# Patient Record
Sex: Male | Born: 1950 | Race: Black or African American | Hispanic: No | Smoking: Never smoker
Health system: Southern US, Community
[De-identification: ages and names within clinical notes are randomized; demographics above are authoritative.]

## PROBLEM LIST (undated history)

## (undated) DIAGNOSIS — I272 Pulmonary hypertension, unspecified: Secondary | ICD-10-CM

## (undated) DIAGNOSIS — I10 Essential (primary) hypertension: Secondary | ICD-10-CM

## (undated) DIAGNOSIS — E785 Hyperlipidemia, unspecified: Secondary | ICD-10-CM

## (undated) DIAGNOSIS — I5189 Other ill-defined heart diseases: Secondary | ICD-10-CM

## (undated) DIAGNOSIS — C9000 Multiple myeloma not having achieved remission: Secondary | ICD-10-CM

## (undated) DIAGNOSIS — IMO0002 Reserved for concepts with insufficient information to code with codable children: Secondary | ICD-10-CM

## (undated) DIAGNOSIS — Z720 Tobacco use: Secondary | ICD-10-CM

## (undated) DIAGNOSIS — I4891 Unspecified atrial fibrillation: Secondary | ICD-10-CM

## (undated) DIAGNOSIS — K922 Gastrointestinal hemorrhage, unspecified: Secondary | ICD-10-CM

## (undated) DIAGNOSIS — D61818 Other pancytopenia: Secondary | ICD-10-CM

## (undated) DIAGNOSIS — D472 Monoclonal gammopathy: Secondary | ICD-10-CM

## (undated) DIAGNOSIS — R9430 Abnormal result of cardiovascular function study, unspecified: Secondary | ICD-10-CM

## (undated) DIAGNOSIS — I340 Nonrheumatic mitral (valve) insufficiency: Secondary | ICD-10-CM

## (undated) DIAGNOSIS — I517 Cardiomegaly: Secondary | ICD-10-CM

## (undated) DIAGNOSIS — H409 Unspecified glaucoma: Secondary | ICD-10-CM

## (undated) DIAGNOSIS — N186 End stage renal disease: Secondary | ICD-10-CM

## (undated) HISTORY — DX: Nonrheumatic mitral (valve) insufficiency: I34.0

## (undated) HISTORY — DX: Essential (primary) hypertension: I10

## (undated) HISTORY — PX: KIDNEY TRANSPLANT: SHX239

## (undated) HISTORY — DX: Unspecified atrial fibrillation: I48.91

## (undated) HISTORY — DX: Pulmonary hypertension, unspecified: I27.20

## (undated) HISTORY — DX: Other pancytopenia: D61.818

## (undated) HISTORY — DX: Gastrointestinal hemorrhage, unspecified: K92.2

## (undated) HISTORY — DX: Other ill-defined heart diseases: I51.89

## (undated) HISTORY — DX: Abnormal result of cardiovascular function study, unspecified: R94.30

## (undated) HISTORY — DX: Multiple myeloma not having achieved remission: C90.00

## (undated) HISTORY — DX: Tobacco use: Z72.0

## (undated) HISTORY — DX: Hyperlipidemia, unspecified: E78.5

## (undated) HISTORY — DX: Reserved for concepts with insufficient information to code with codable children: IMO0002

## (undated) HISTORY — DX: Cardiomegaly: I51.7

## (undated) HISTORY — DX: End stage renal disease: N18.6

## (undated) HISTORY — DX: Monoclonal gammopathy: D47.2

---

## 1990-08-05 HISTORY — PX: AV FISTULA PLACEMENT: SHX1204

## 2006-06-30 ENCOUNTER — Ambulatory Visit: Payer: Self-pay | Admitting: Cardiology

## 2006-08-04 ENCOUNTER — Ambulatory Visit: Payer: Self-pay | Admitting: Cardiology

## 2006-09-12 ENCOUNTER — Ambulatory Visit: Payer: Self-pay | Admitting: Cardiology

## 2006-10-16 ENCOUNTER — Ambulatory Visit: Payer: Self-pay | Admitting: Gastroenterology

## 2006-10-28 ENCOUNTER — Ambulatory Visit: Payer: Self-pay | Admitting: Cardiology

## 2006-11-14 ENCOUNTER — Ambulatory Visit: Payer: Self-pay | Admitting: Internal Medicine

## 2006-11-14 ENCOUNTER — Encounter (INDEPENDENT_AMBULATORY_CARE_PROVIDER_SITE_OTHER): Payer: Self-pay | Admitting: Specialist

## 2006-11-14 ENCOUNTER — Ambulatory Visit (HOSPITAL_COMMUNITY): Admission: RE | Admit: 2006-11-14 | Discharge: 2006-11-14 | Payer: Self-pay | Admitting: Internal Medicine

## 2007-02-11 ENCOUNTER — Ambulatory Visit: Admission: RE | Admit: 2007-02-11 | Discharge: 2007-04-13 | Payer: Self-pay | Admitting: Radiation Oncology

## 2008-01-05 ENCOUNTER — Ambulatory Visit: Payer: Self-pay | Admitting: Cardiology

## 2008-01-19 ENCOUNTER — Encounter: Payer: Self-pay | Admitting: Physician Assistant

## 2008-01-19 ENCOUNTER — Ambulatory Visit: Payer: Self-pay | Admitting: Cardiology

## 2008-02-29 ENCOUNTER — Encounter: Payer: Self-pay | Admitting: Physician Assistant

## 2008-02-29 ENCOUNTER — Ambulatory Visit: Payer: Self-pay | Admitting: Cardiology

## 2008-03-11 ENCOUNTER — Ambulatory Visit: Payer: Self-pay | Admitting: Internal Medicine

## 2008-03-17 ENCOUNTER — Encounter: Payer: Self-pay | Admitting: Cardiology

## 2008-03-17 ENCOUNTER — Ambulatory Visit (HOSPITAL_COMMUNITY): Admission: RE | Admit: 2008-03-17 | Discharge: 2008-03-17 | Payer: Self-pay | Admitting: Internal Medicine

## 2008-03-28 ENCOUNTER — Ambulatory Visit: Payer: Self-pay | Admitting: Internal Medicine

## 2008-04-14 ENCOUNTER — Ambulatory Visit (HOSPITAL_COMMUNITY): Admission: RE | Admit: 2008-04-14 | Discharge: 2008-04-14 | Payer: Self-pay | Admitting: Internal Medicine

## 2008-04-14 ENCOUNTER — Ambulatory Visit: Payer: Self-pay | Admitting: Internal Medicine

## 2008-07-14 ENCOUNTER — Encounter: Payer: Self-pay | Admitting: Cardiology

## 2008-07-15 ENCOUNTER — Encounter: Payer: Self-pay | Admitting: Cardiology

## 2008-09-08 ENCOUNTER — Ambulatory Visit: Payer: Self-pay | Admitting: Cardiology

## 2009-04-04 ENCOUNTER — Ambulatory Visit: Payer: Self-pay | Admitting: Cardiology

## 2009-04-04 ENCOUNTER — Encounter: Payer: Self-pay | Admitting: Cardiology

## 2009-04-18 ENCOUNTER — Ambulatory Visit: Payer: Self-pay | Admitting: Cardiology

## 2009-04-21 ENCOUNTER — Encounter (INDEPENDENT_AMBULATORY_CARE_PROVIDER_SITE_OTHER): Payer: Self-pay | Admitting: *Deleted

## 2009-05-10 ENCOUNTER — Encounter: Payer: Self-pay | Admitting: Cardiology

## 2009-10-30 ENCOUNTER — Ambulatory Visit: Payer: Self-pay | Admitting: Cardiology

## 2010-01-22 ENCOUNTER — Ambulatory Visit: Payer: Self-pay | Admitting: Cardiology

## 2010-02-21 ENCOUNTER — Ambulatory Visit (HOSPITAL_COMMUNITY): Admission: RE | Admit: 2010-02-21 | Discharge: 2010-02-21 | Payer: Self-pay | Admitting: Nephrology

## 2010-04-18 ENCOUNTER — Ambulatory Visit: Payer: Self-pay | Admitting: Cardiology

## 2010-05-07 ENCOUNTER — Ambulatory Visit: Payer: Self-pay | Admitting: Cardiology

## 2010-06-15 ENCOUNTER — Encounter: Payer: Self-pay | Admitting: Cardiology

## 2010-06-18 ENCOUNTER — Encounter: Payer: Self-pay | Admitting: Cardiology

## 2010-09-06 NOTE — Assessment & Plan Note (Signed)
Summary: 3 mo fu   Visit Type:  Follow-up Primary Charnele Semple:  [name]   History of Present Illness: the patient is a 60 year old male recently hospitalized with alcoholic pancreatitis. The patient has end-stage renal disease on hemodialysis, aspirin and a defibrillation but is not a Coumadin candidate. In addition he has severe valvular heart disease, including moderate to severe mitral regurgitation moderate tricuspid regurgitation associated with severe pulmonary hypertension. His ejection fraction is 60-65%. We were consulted during his recent hospitalization and make changes in his medical regimen. Cardizem was increased for rate control. The patient states that he has not been drinking any alcohol. His pancreatitis appears to have resolved. He denies any chest pain shortness of breath orthopnea PND. He reports no palpitations.  Preventive Screening-Counseling & Management  Alcohol-Tobacco     Smoking Status: quit     Year Quit: 2007  Current Medications (verified): 1)  Daily Vite  Tabs (Multiple Vitamin) .... Take 1 Tablet By Mouth Once A Day 2)  Sensipar 30 Mg Tabs (Cinacalcet Hcl) .... Take 1 Tablet By Mouth Every Other Day With Evening Meal 3)  Labetalol Hcl 200 Mg Tabs (Labetalol Hcl) .... Take 1 Tablet By Mouth Two Times A Day 4)  Fosrenol 1000 Mg Chew (Lanthanum Carbonate) .[name].. 1 With Each Meal and 500mg  With Snacks 5)  Zinc 50 Mg Tabs (Zinc) .... Take 1 Tablet By Mouth Once A Day 6)  Aspirin 81 Mg  Tabs (Aspirin) .... Take 1 Tablet By Mouth Once A Day 7)  Simvastatin 20 Mg Tabs (Simvastatin) .... Take 1 Tab By Mouth At Bedtime 8)  Multivitamins  Tabs (Multiple Vitamin) .... Take 1 Tablet By Mouth Once A Day 9)  Aleve 220 Mg Tabs (Naproxen Sodium) .... As Needed 10)  Cardizem Cd 120 Mg Xr24h-Cap (Diltiazem Hcl Coated Beads) .... Take 1 Tablet By Mouth Twice A Day 11)  Midodrine Hcl 10 Mg Tabs (Midodrine Hcl) .... Take As Needed X 1 Dose For Low Blood Pressure, May Take Up To Two  Times A Day  Allergies (verified): No Known Drug Allergies  Comments:  Nurse/Medical Assistant: The patient's medication list and allergies were reviewed with the patient and were updated in the Medication and Allergy Lists.  Past History:  Family History: Last updated: 04/14/2009 Insignificant for premature CAD.   Social History: Last updated: 04/14/2009   He denies any tobacco abuse.      Risk Factors: Smoking Status: quit (05/07/2010)  Past Medical History:  1. Hypertensive heart disease with severe left ventricular      hypertrophy.  2. Hyperdynamic LV contractility, ejection fraction 60-65%.  3. Pulmonary hypertension, P-systolic pressure 99991111 mgHg.  4. Moderate2 severe mitral regurgitation.  5. End stage renal disease.  1. Persistent atrial fibrillation.     a.     CHAD2:  1. 2. Hypertension. 3. End-stage renal disease, on hemodialysis. 4. Normal LVF. 5. Moderate mitral regurgitation.     a.     By 2-D echo, June 2009. Mild to severe mitral regurgitation and moderate tricuspid regurgitation echocardiogram 2011 6. Pulmonary hypertension. 7. Dyslipidemia. 8. History of lower gastrointestinal bleed.not on Coumadin.     a.     Question secondary to arteriovenous malformation. 9. Hypertrophic cardiomyopathy.  Status post alcoholic pancreatitis  Social History: Smoking Status:  quit  Review of Systems  The patient denies fatigue, malaise, fever, weight gain/loss, vision loss, decreased hearing, hoarseness, chest pain, palpitations, shortness of breath, prolonged cough, wheezing, sleep apnea, coughing up blood, abdominal pain, blood  in stool, nausea, vomiting, diarrhea, heartburn, incontinence, blood in urine, muscle weakness, joint pain, leg swelling, rash, skin lesions, headache, fainting, dizziness, depression, anxiety, enlarged lymph nodes, easy bruising or bleeding, and environmental allergies.    Vital Signs:  Patient profile:   60 year old male Height:       74 inches Weight:      162 pounds Pulse rate:   76 / minute BP sitting:   161 / 95  (right arm) Cuff size:   regular  Vitals Entered By: Georgina Peer (May 07, 2010 8:57 AM)  Physical Exam  Additional Exam:  General: Well-developed, well-nourished in no distress head: Normocephalic and atraumatic eyes PERRLA/EOMI intact, conjunctiva and lids normal nose: No deformity or lesions mouth normal dentition, normal posterior pharynx neck: Supple, no JVD.  No masses, thyromegaly or abnormal cervical nodes lungs: Normal breath sounds bilaterally without wheezing.  Normal percussion heart:irregular rate and rhythm with normal S1 and S2, no S3 or S4.  PMI is normal.  No pathological murmurs abdomen: Normal bowel sounds, abdomen is soft and nontender without masses, organomegaly or hernias noted.  No hepatosplenomegaly musculoskeletal: Back normal, normal gait muscle strength and tone normal pulsus: Pulse is normal in all 4 extremities Extremities: No peripheral pitting edema neurologic: Alert and oriented x 3 skin: Intact without lesions or rashes cervical nodes: No significant adenopathy psychologic: Normal affect    Impression & Recommendations:  Problem # 1:  CARDIOMYOPATHY, HYPERTROPHIC (ICD-425.1) Assessment Comment Only  His updated medication list for this problem includes:    Labetalol Hcl 200 Mg Tabs (Labetalol hcl) .[name]... Take 1 tablet by mouth two times a day    Aspirin 81 Mg Tabs (Aspirin) .[name]... Take 1 tablet by mouth once a day    Cardizem Cd 120 Mg Xr24h-cap (Diltiazem hcl coated beads) .[name]... Take 1 tablet by mouth twice a day  Problem # 2:  GASTROINTESTINAL HEMORRHAGE, HX OF (ICD-V12.79) patient is not a candidate for Coumadin.  Problem # 3:  ATRIAL FIBRILLATION, CHRONIC (ICD-427.31) chronic atrial fibrillation now rate controlled with adjustment of medications. His updated medication list for this problem includes:    Labetalol Hcl 200 Mg Tabs (Labetalol hcl)  .[name]... Take 1 tablet by mouth two times a day    Aspirin 81 Mg Tabs (Aspirin) .[name]... Take 1 tablet by mouth once a day  Problem # 4:  MITRAL REGURGITATION (ICD-396.3) patient has moderate to severe mitral regurgitation, moderate tricuspid regurgitation as well as severe pulmonary hypertension. Continue medical treatment. Patient is not a candidate for surgery  Patient Instructions: 1)  Your physician recommends that you continue on your current medications as directed. Please refer to the Current Medication list given to you today. 2)  Follow up in  6 months

## 2010-09-06 NOTE — Assessment & Plan Note (Signed)
Summary: 1 YR FU PER MARCH REMINDER-SRS   Visit Type:  Follow-up Primary Provider:  [name]   History of Present Illness: the patient is a 60 year old male with a history of hypertrophic myopathy, or hypertension, moderate mitral regurgitation and end-stage renal disease. The patient is currently NYHA class II. He denies any chest pain short of breath orthopnea PND. He is a dialysis schedule of Tuesday Thursday Saturday. He has no heart failure symptoms. He is compliant with his medical regimen. He has to discontinue his antihypertensive medications prior to dialysis because of hypotension. He reports no presyncope or syncope. The patient had a recent ER admission because of alcohol abuse. He is on Coumadin due to prior history of lower GI bleeding secondary to AVMs.  Current Problems (verified): 1)  Cardiomyopathy, Hypertrophic  (ICD-425.1) 2)  Gastrointestinal Hemorrhage, Hx of  (ICD-V12.79) 3)  Dyslipidemia  (ICD-272.4) 4)  Hypertension, Pulmonary  (ICD-416.8) 5)  Atrial Fibrillation, Chronic  (ICD-427.31) 6)  Renal Disease  (ICD-593.9) 7)  Mitral Regurgitation  (ICD-396.3) 8)  Hypertension  (ICD-401.9)  Current Medications (verified): 1)  Dialyvite 800-Zinc 15  Tabs (B Complex-C-Zn-Folic Acid) .[name].. 15 Daily 2)  Sensipar 30 Mg Tabs (Cinacalcet Hcl) .... Take 1 Tablet By Mouth Every Other Day With Evening Meal 3)  Amlodipine Besylate 10 Mg Tabs (Amlodipine Besylate) .... Take 1 Tablet By Mouth Once A Day 4)  Labetalol Hcl 300 Mg Tabs (Labetalol Hcl) .... 2 By Mouth Two Times A Day 5)  Fosrenol 1000 Mg Chew (Lanthanum Carbonate) .[name].. 1 With Each Meal and 500mg  With Snacks 6)  Zinc 50 Mg Tabs (Zinc) .... Take 1 Tablet By Mouth Once A Day 7)  Clonidine Hcl 0.1 Mg Tabs (Clonidine Hcl) .... Take 1 Tablet By Mouth Two Times A Day 8)  Aspirin 81 Mg  Tabs (Aspirin) .... Take 1 Tablet By Mouth Once A Day 9)  Simvastatin 20 Mg Tabs (Simvastatin) .... Take 1 Tab By Mouth At Bedtime 10)  Minoxidil  2.5 Mg Tabs (Minoxidil) .... Take 2 Tablet By Mouth Once A Day 11)  Multivitamins  Tabs (Multiple Vitamin) .... Take 1 Tablet By Mouth Once A Day 12)  Aleve 220 Mg Tabs (Naproxen Sodium) .... As Needed  Allergies (verified): No Known Drug Allergies  Comments:  Nurse/Medical Assistant: The patient's medications and allergies were reviewed with the patient and were updated in the Medication and Allergy Lists. List reviewed.  Past History:  Past Medical History: Last updated: 04/18/2009  1. Hypertensive heart disease with severe left ventricular      hypertrophy.  2. Hyperdynamic LV contractility, ejection fraction 60-65%.  3. Pulmonary hypertension, P-systolic pressure 99991111 mgHg.  4. Moderate mitral regurgitation.  5. End stage renal disease.  1. Persistent atrial fibrillation.     a.     CHAD2:  1. 2. Hypertension. 3. End-stage renal disease, on hemodialysis. 4. Normal LVF. 5. Moderate mitral regurgitation.     a.     By 2-D echo, June 2009. 6. Pulmonary hypertension. 7. Dyslipidemia. 8. History of lower gastrointestinal bleed.     a.     Question secondary to arteriovenous malformation. 9. Hypertrophic cardiomyopathy.   Family History: Last updated: 04/14/2009 Insignificant for premature CAD.   Social History: Last updated: 04/14/2009   He denies any tobacco abuse.      Risk Factors: Smoking Status: quit > 6 months (04/18/2009)  Review of Systems  The patient denies anorexia, fever, weight loss, weight gain, vision loss, decreased hearing, hoarseness, chest pain,  syncope, dyspnea on exertion, peripheral edema, prolonged cough, headaches, hemoptysis, abdominal pain, melena, hematochezia, severe indigestion/heartburn, hematuria, incontinence, genital sores, muscle weakness, suspicious skin lesions, transient blindness, difficulty walking, depression, unusual weight change, abnormal bleeding, enlarged lymph nodes, angioedema, breast masses, and testicular masses.     Vital Signs:  Patient profile:   60 year old male Height:      74 inches Weight:      164 pounds Pulse rate:   61 / minute BP sitting:   126 / 80  (right arm) Cuff size:   regular  Vitals Entered By: Georgina Peer (October 30, 2009 4:01 PM)  Physical Exam  Additional Exam:  General: Well-developed, well-nourished in no distress head: Normocephalic and atraumatic eyes PERRLA/EOMI intact, conjunctiva and lids normal nose: No deformity or lesions mouth normal dentition, normal posterior pharynx neck: Supple, no JVD.  No masses, thyromegaly or abnormal cervical nodes lungs: Normal breath sounds bilaterally without wheezing.  Normal percussion heart: regular rate and rhythm with normal S1 and S2, no S3 or S4.  PMI is normal.  No pathological murmurs abdomen: Normal bowel sounds, abdomen is soft and nontender without masses, organomegaly or hernias noted.  No hepatosplenomegaly musculoskeletal: Back normal, normal gait muscle strength and tone normal pulsus: Pulse is normal in all 4 extremities Extremities: No peripheral pitting edema neurologic: Alert and oriented x 3 skin: Intact without lesions or rashes cervical nodes: No significant adenopathy psychologic: Normal affect    Impression & Recommendations:  Problem # 1:  ATRIAL FIBRILLATION, CHRONIC (ICD-427.31) the patient's chronic atrial fibrillation. His heart is however well controlled. I made no change in his regimen. His updated medication list for this problem includes:    Labetalol Hcl 300 Mg Tabs (Labetalol hcl) .[name]... 2 by mouth two times a day    Aspirin 81 Mg Tabs (Aspirin) .[name]... Take 1 tablet by mouth once a day  Problem # 2:  CARDIOMYOPATHY, HYPERTROPHIC (ICD-425.1) patient's hypertrophic cardio myopathy. He reports no dyspnea. Continue medical therapy with beta blocker His updated medication list for this problem includes:    Amlodipine Besylate 10 Mg Tabs (Amlodipine besylate) .[name]... Take 1 tablet by mouth once  a day    Labetalol Hcl 300 Mg Tabs (Labetalol hcl) .[name]... 2 by mouth two times a day    Aspirin 81 Mg Tabs (Aspirin) .[name]... Take 1 tablet by mouth once a day  Problem # 3:  MITRAL REGURGITATION (ICD-396.3) patient has mitral regurgitant murmur and exam. However he has no heart failure symptoms. We will continue to follow this clinically .  Problem # 4:  HYPERTENSION (ICD-401.9) the patient's blood pressure is well controlled.her poorly asked to hold his blood pressure medications for dialysis because of hypotension during dialysis. His updated medication list for this problem includes:    Amlodipine Besylate 10 Mg Tabs (Amlodipine besylate) .[name]... Take 1 tablet by mouth once a day    Labetalol Hcl 300 Mg Tabs (Labetalol hcl) .[name]... 2 by mouth two times a day    Clonidine Hcl 0.1 Mg Tabs (Clonidine hcl) .[name]... Take 1 tablet by mouth two times a day    Aspirin 81 Mg Tabs (Aspirin) .[name]... Take 1 tablet by mouth once a day    Minoxidil 2.5 Mg Tabs (Minoxidil) .[name]... Take 2 tablet by mouth once a day  Patient Instructions: 1)  Your physician recommends that you continue on your current medications as directed. Please refer to the Current Medication list given to you today. 2)  Follow up in  6 months

## 2010-09-06 NOTE — Medication Information (Signed)
Summary: RX Folder/ DENIAL OF LABETALOL APPEAL  RX Folder/ DENIAL OF LABETALOL APPEAL   Imported By: Bartholomew Boards 06/20/2010 [id number]  _____________________________________________________________________  External Attachment:    Type:   Image     Comment:   External Document

## 2010-09-06 NOTE — Assessment & Plan Note (Signed)
Summary: BP/HR PROBLEMS W/DIALYSIS   Visit Type:  tachycardia Primary Provider:  [name]   History of Present Illness: the patient is a 60 year old male with a history of hypertrophic cardiomyopathy, hypertension, moderate mitral regurgitation and end-stage renal disease. The patient is an unlikely class II. Patient also has pulmonary hypertension. The patient has been referred because of increased heart rates noted to hemodialysis. This is associated with a sensation of lightheadedness. Last Thursday during his hemodialysis sessions at a very low blood pressure and increased heart rate although I do not have any further details. The patient's dialysis schedule is TTS. The patient already does not take his blood pressure medications on hemodialysis days due to hypotension.  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  Current Medications (verified): 1)  Dialyvite 800-Zinc 15  Tabs (B Complex-C-Zn-Folic Acid) .[name].. 15 Daily 2)  Sensipar 30 Mg Tabs (Cinacalcet Hcl) .... Take 1 Tablet By Mouth Every Other Day With Evening Meal 3)  Labetalol Hcl 300 Mg Tabs (Labetalol Hcl) .... 2 By Mouth Two Times A Day 4)  Fosrenol 1000 Mg Chew (Lanthanum Carbonate) .[name].. 1 With Each Meal and 500mg  With Snacks 5)  Zinc 50 Mg Tabs (Zinc) .... Take 1 Tablet By Mouth Once A Day 6)  Clonidine Hcl 0.1 Mg Tabs (Clonidine Hcl) .... Take 1 Tablet By Mouth Two Times A Day 7)  Aspirin 81 Mg  Tabs (Aspirin) .... Take 1 Tablet By Mouth Once A Day 8)  Simvastatin 20 Mg Tabs (Simvastatin) .... Take 1 Tab By Mouth At Bedtime 9)  Minoxidil 2.5 Mg Tabs (Minoxidil) .... Take 2 Tablet By Mouth Once A Day 10)  Multivitamins  Tabs (Multiple Vitamin) .... Take 1 Tablet By Mouth Once A Day 11)  Aleve 220 Mg Tabs (Naproxen Sodium) .... As Needed 12)  Cardizem Cd 120 Mg Xr24h-Cap (Diltiazem Hcl Coated Beads) .... Take 1 Tablet By Mouth Once A Day 13)   Midodrine Hcl 10 Mg Tabs (Midodrine Hcl) .... Take As Needed X 1 Dose For Low Blood Pressure, May Take Up To Two Times A Day  Allergies (verified): No Known Drug Allergies  Comments:  Nurse/Medical Assistant: The patient's medication list and allergies were reviewed with the patient and were updated in the Medication and Allergy Lists.  Past History:  Family History: Last updated: 04/14/2009 Insignificant for premature CAD.   Social History: Last updated: 04/14/2009   He denies any tobacco abuse.      Risk Factors: Smoking Status: quit > 6 months (04/18/2009)  Past Medical History:  1. Hypertensive heart disease with severe left ventricular      hypertrophy.  2. Hyperdynamic LV contractility, ejection fraction 60-65%.  3. Pulmonary hypertension, P-systolic pressure 99991111 mgHg.  4. Moderate mitral regurgitation.  5. End stage renal disease.  1. Persistent atrial fibrillation.     a.     CHAD2:  1. 2. Hypertension. 3. End-stage renal disease, on hemodialysis. 4. Normal LVF. 5. Moderate mitral regurgitation.     a.     By 2-D echo, June 2009. 6. Pulmonary hypertension. 7. Dyslipidemia. 8. History of lower gastrointestinal bleed.not on Coumadin.     a.     Question secondary to arteriovenous malformation. 9. Hypertrophic cardiomyopathy.   Review of Systems       The patient complains of shortness of breath and dizziness.  The patient denies fatigue, malaise, fever, weight gain/loss, vision loss, decreased  hearing, hoarseness, chest pain, palpitations, prolonged cough, wheezing, sleep apnea, coughing up blood, abdominal pain, blood in stool, nausea, vomiting, diarrhea, heartburn, incontinence, blood in urine, muscle weakness, joint pain, leg swelling, rash, skin lesions, headache, fainting, depression, anxiety, enlarged lymph nodes, easy bruising or bleeding, and environmental allergies.    Vital Signs:  Patient profile:   60 year old male Height:      74  inches Weight:      160 pounds BMI:     20.62 Pulse rate:   92 / minute BP sitting:   188 / 114  (right arm) Cuff size:   regular  Vitals Entered By: Georgina Peer (January 22, 2010 10:11 AM)   Physical Exam  Additional Exam:  General: Well-developed, well-nourished in no distress head: Normocephalic and atraumatic eyes PERRLA/EOMI intact, conjunctiva and lids normal nose: No deformity or lesions mouth normal dentition, normal posterior pharynx neck: Supple, no JVD.  No masses, thyromegaly or abnormal cervical nodes lungs: Normal breath sounds bilaterally without wheezing.  Normal percussion heart:irregular rate and rhythm with normal S1 and S2, no S3 or S4.  PMI is normal.  No pathological murmurs abdomen: Normal bowel sounds, abdomen is soft and nontender without masses, organomegaly or hernias noted.  No hepatosplenomegaly musculoskeletal: Back normal, normal gait muscle strength and tone normal pulsus: Pulse is normal in all 4 extremities Extremities: No peripheral pitting edema neurologic: Alert and oriented x 3 skin: Intact without lesions or rashes cervical nodes: No significant adenopathy psychologic: Normal affect    Impression & Recommendations:  Problem # 1:  ATRIAL FIBRILLATION, CHRONIC (ICD-427.31) we'll discontinue amlodipine and changed to Cardizem CD 120 mg p.o. q. daily. I do want to take the patient take his Cardizem the day  off dialysis. His updated medication list for this problem includes:    Labetalol Hcl 300 Mg Tabs (Labetalol hcl) .[name]... 2 by mouth two times a day    Aspirin 81 Mg Tabs (Aspirin) .[name]... Take 1 tablet by mouth once a day  Orders: EKG w/ Interpretation (93000)  Problem # 2:  HYPOTENSION (ICD-458.9) the patient is hypotension on the day of dialysis. Given her a prescription of Midrin 10 mg p.o. to take p.r.n. up to b.i.d. if he develops hypotension during dialysis.  Problem # 3:  GASTROINTESTINAL HEMORRHAGE, HX OF (ICD-V12.79) patient is  not on Coumadin due to prior history of GI bleeding. He reports no recent bleeding.  Patient Instructions: 1)  Patient does not take blood pressure medications on day of dialysis per Dr. Hinda Lenis. 2)  However, will stop Amlodipine. 3)  Will begin Cardizem CD 120mg  daily and DO take on day of dialysis. 4)  If blood pressure low after dialysis, take Midodrin 10mg  as needed x one dose.  May take this medication up to two times a day on day of dialysis. (may take 2nd dose 2-3 hours after 1st dose if needed.  5)  Follow up in  3 months. Prescriptions: CARDIZEM CD 120 MG XR24H-CAP (DILTIAZEM HCL COATED BEADS) Take 1 tablet by mouth once a day  #30 x 6   Entered by:   Lovina Reach, LPN   Authorized by:   Terald Sleeper, MD, [facility]   Signed by:   Lovina Reach, LPN on [id number]   Method used:   Electronically to        [id number] (retail)       [address]  [place], [place]  [address]       Ph: [phone] or [phone]       Fax: [phone]   [id number]  I have personnaly reviewed all medications changes and approved new prescriptions and refills. Terald Sleeper, MD, [facility]  January 22, 2010 10:42 AM

## 2010-09-06 NOTE — Letter (Signed)
Summary: External Correspondence/ [place]  External Correspondence/ [place]   Imported By: Bartholomew Boards 10/30/2009 14:54:17  _____________________________________________________________________  External Attachment:    Type:   Image     Comment:   External Document

## 2010-09-06 NOTE — Medication Information (Signed)
Summary: RX Folder/ AUTHORIZED LABETALOL 200 MG  RX Folder/ AUTHORIZED LABETALOL 200 MG   Imported By: Bartholomew Boards 06/22/2010 12:42:10  _____________________________________________________________________  External Attachment:    Type:   Image     Comment:   External Document

## 2010-09-27 ENCOUNTER — Telehealth (INDEPENDENT_AMBULATORY_CARE_PROVIDER_SITE_OTHER): Payer: Self-pay | Admitting: *Deleted

## 2010-10-02 NOTE — Progress Notes (Signed)
Summary: RX REFILL CLARIFICATION  Phone Note From Pharmacy   Request: Speak with Nurse Summary of Call: [facility] called to confirm Rx for [place] on Diltiazem and Simvastatin.  He said they received the Rx on 09/13/10. I reviewed the emr and could not find documentation on Rx going to [facility]. please call them at [phone] and make reference to [id number]. Initial call taken by: Neoma Laming,  September 27, 2010 11:56 AM  Follow-up for Phone Call        spoke with pharmacist at [facility] to clarify rx's sent for patient on 09/17/10. Follow-up by: Georgina Peer,  September 27, 2010 2:57 PM

## 2010-12-18 NOTE — Assessment & Plan Note (Signed)
[facility] OFFICE NOTE   Brian, Liu                     MRN:          [id number]  DATE:09/08/2008                            DOB:          1950/08/08    PRIMARY CARDIOLOGIST:  Ernestine Mcmurray, MD, [facility]   REASON FOR VISIT:  Scheduled followup.   Since last seen here in the clinic, Brian Liu did undergo an  evaluation by Dr. Virl Axe, with respect to his documented atrial  fibrillation.  Dr. Caryl Comes felt that he did not need to be anticoagulated  with long-term Coumadin, in the setting of a CHAD2 score of 1.  This is  based on the fact that his only thromboembolic risk factor is  hypertension.  Also, Dr. Caryl Comes was concerned that his risk of bleeding  was increased, given that he is a hemodialysis patient.   Clinically, Brian Liu denies any interim development of exertional  angina pectoris, dyspnea, tachy palpitations, or lower extremity edema.   CURRENT MEDICATIONS:  1. Amlodipine 10 daily.  2. Labetalol 600 b.i.d.  3. Aspirin 81 daily.  4. Clonidine 0.1 b.i.d.  5. Simvastatin 20 nightly.  6. Minoxidil 2.5 b.i.d.  7. Sensipar 30 nightly.  8. Dialyvite.   PHYSICAL EXAMINATION:  VITAL SIGNS:  Blood pressure 129/73, pulse 83,  irregular, weight 163 (up 2).  GENERAL:  A 60 year old gentleman sitting upright in no distress.  HEENT:  Normocephalic, atraumatic.  NECK:  Palpable bilateral carotid pulses without bruits; no JVD.  LUNGS:  Clear to auscultation in all fields.  HEART:  Irregularly irregular.  A soft grade 2/6 holosystolic murmur.  No rubs.  ABDOMEN:  Soft, nontender.  EXTREMITIES:  No significant edema.  NEURO:  No focal deficit.   IMPRESSION:  1. Persistent atrial fibrillation.      a.     CHAD2:  1.  2. Hypertension.  3. End-stage renal disease, on hemodialysis.  4. Normal LVF.  5. Moderate mitral regurgitation.      a.     By 2-D echo, June 2009.  6. Pulmonary  hypertension.  7. Dyslipidemia.  8. History of lower gastrointestinal bleed.      a.     Question secondary to arteriovenous malformation.  9. Hypertrophic cardiomyopathy.   PLAN:  1. Surveillance 2-D echocardiogram in June of this year, representing      1-year followup.  This is for reassessment of left ventricular      function, as well as severity of mitral regurgitation and pulmonary      hypertension.  2. Surveillance fasting lipid/liver profile, to be drawn at the same      time of the echocardiogram.  This is so as to continue monitoring      his lipid status, for primary prevention.  Of note, the patient has      no documented history of coronary artery disease.  3. Schedule return clinic followup with myself and Dr. Dannielle Burn in 6      months, for review of echo results and further recommendations.      Gene Serpe, PA-C  Electronically Signed      Ernestine Mcmurray, MD,FACC  Electronically Signed   GS/MedQ  DD: 09/08/2008  DT: 09/09/2008  Job #: [id number]   cc:   Jerene Bears, MD

## 2010-12-18 NOTE — Letter (Signed)
March 28, 2008    Brian Mcmurray, MD,FACC  [address] [place], [place] [address]   RE:  Brian, Liu  MRN:  [id number]  /  DOB:  23-Sep-1950   Dear Brian Liu,   Brian Liu came down today and it was a pleasure to see him at your  request regarding his atrial arrhythmia.   As you know, he is a 60 year old gentleman with a long-standing history  of hypertension, hypertrophic heart disease, left ventricular  hypertrophy, and left atrial enlargement who has atrial fibrillation of  unknown duration.  Electrocardiograms have been reviewed and have been  questionable for atrial flutter versus atrial tachycardia versus atrial  fibrillation and a question, I think, that was of concern is:  a.  Whether the rhythm was ablatable.  b.  Whether the patient should be on Coumadin.   The patient has a history of GI bleeding that from Dr. Roseanne Liu notes is  potentially attributable to post polypectomy.  There is also a question  of mild thrombocytopenia, raising the possibility of portal hypertension  and the possibility of a repeat EGD has been raised.   The patient's thromboembolic risk factors are notable for his  hypertension.  His ejection fraction is normal.  He has had no prior  strokes.  He does not have diabetes and he is 60 years of age.  She does  have hypertension.   He has no history of palpitations.  His fluid is well managed by his  intake as well as his dialysis and his dyspnea on exertion is modest.   Past surgical history is remote.   His medical review of systems in addition to above is notable for:  1. GE reflux disease.  2. Arthritis.  3. Gout.  4. Sexual dysfunction.  Review of systems is otherwise broadly      negative.   Social history, he is widowed.  He has 2 sons with whom he lives.  He  used to work in [employer] until he was disabled.  He does not use  cigarettes, alcohol, or recreational drugs.   His current medications include zinc, simvastatin 20,  clonidine 0.1  b.i.d., labetalol 600 b.i.d., minoxidil 5 daily, and amlodipine 10.   He has no known drug allergies.   On examination, he is a middle-aged Brian Liu male, appearing  somewhat older than his stated age of 63.  His blood pressure is 120/83,  his pulse is 64, and his weight was 161.  His HEENT exam demonstrated no  icterus or xanthomata.  His neck veins were flat.  His carotids are  brisk and full bilaterally without bruits.  Back was without kyphosis or  scoliosis.  His lungs were clear.  Heart sounds were regular with a  diminished S1. A 2/6 to 3/6 harsh systolic murmur radiating out to the  apex.  The abdomen was soft with active bowel sounds.  There was no  midline pulsation.  Femoral pulses were 2+.  Distal pulses were intact.  There was no clubbing, cyanosis, or edema.  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  Neurological exam was grossly  normal.   Electrocardiogram dated today demonstrated an atrial fibrillation of 59  with intervals of  /0.1/0.47.  The axis was leftward of -72.  Electrocardiogram from [facility] on February 29, 2008, and from January 05, 2008, are  all consistent with atrial fibrillation, various degrees of coarseness.   IMPRESSION:  1. Atrial fibrillation - persistent.  2. Thromboembolic risk factors notable for hypertension and a [place]      score of 1.  3. History of gastrointestinal bleeding following polypectomy.  4. End-stage renal disease on hemodialysis.   Brian Liu, Brian Liu, has atrial fibrillation in the setting of a [place] score  of 1.  I do not think that he needs long-term Coumadin.  There is a  recent work being done on thromboembolic risk in patients with  hemodialysis and my recollection of it was that this __________ where  the patient is at increased risk of bleeding as well as stroke, but I do  not remember a specific recommendation as to thromboembolic risk  reduction in this cohort.   I will need to  review this again, but for right now, my recommendation  was that we do nothing different.   I further do not think he is a candidate for catheter ablation.   Thank you very much for asking me to see him.    Sincerely,      Deboraha Sprang, MD, [facility]  Electronically Signed    SCK/MedQ  DD: 03/28/2008  DT: 03/29/2008  Job #: [id number]   CC:    Jerene Bears, MD

## 2010-12-18 NOTE — Assessment & Plan Note (Signed)
[facility]                          [facility] OFFICE NOTE   DETRIC, SHAHEEN                     MRN:          [id number]  DATE:01/05/2008                            DOB:          09/07/1950    CARDIOLOGIST:  Ernestine Mcmurray, MD, [facility]   PRIMARY CARE PHYSICIAN:  Jerene Bears, MD   REASON FOR VISIT:  A 2-year follow-up, chest pain.   HISTORY OF PRESENT ILLNESS:  Mr. Rothrock is a 60 year old male patient  with a history of hypertensive heart disease and moderate to severe  pulmonary hypertension who returns to the office today for follow-up.  We have not seen him in the office since December 2007.  He was last  seen at [facility] November 2007 with congestive heart failure.  He is on hemodialysis secondary to end-stage renal disease.  He is  status post failed kidney transplantation.  Since being seen, he has  been diagnosed with squamous cell carcinoma of the larynx.  He has  undergone radiation treatments with Dr. Tammi Klippel of Radiation Oncology.  He was last admitted to [facility] in April 2008 with suspected  lower GI bleed.  He was transfused 5 units of packed red blood cells.  He had undergone a colonoscopy prior to this admission.  An EGD was done  while he was hospitalized that did not show any acute bleeding.  A  laryngeal polyp was noted, and he has had follow-up with ENT since then.   The patient presents to office today with complaints of chest pain over  last week or so.  This is very atypical.  It is left sided and brought  on with movements of his left upper extremity.  He has shooting pain  down to his thumb and he has had some thumb numbness.  He has been set  up with an orthopedist to evaluate him sometime this week.  He denies  any associated nausea or diaphoresis.  He denies any associated  shortness of breath.  He denies any syncope or presyncope.  Of note, he  denies any palpitations or tachy arrhythmias.  He  does note a history of  SVT in the past.  According to the records, he has a remote history of  SVT ablation in the mid 1990s.   CURRENT MEDICATIONS:  1. Dialyvite.  2. Sensipar.  3. Amlodipine 10 mg daily.  4. Labetalol 300 mg 2 tablets b.i.d.  5. Fosrenol 1000 mg three times a day with meals.  6. Minoxidil 2.5 mg daily.  7. Zinc 50 mg daily.   ALLERGIES:  NO KNOWN DRUG ALLERGIES.   SOCIAL HISTORY:  He denies any tobacco abuse.   FAMILY HISTORY:  Insignificant for premature CAD.   REVIEW OF SYSTEMS:  Please see HPI.  Denies any melena daily,  hemoptysis, hematemesis.  He denies any fevers, chills, cough.  Rest of  the review of systems are negative.   PHYSICAL EXAMINATION:  GENERAL:  He is well-nourished well-developed  male in no acute distress.  VITAL SIGNS:  Blood pressure 161/101, pulse 81, weight 161.8 pounds.  HEENT:  Normal.  NECK:  Without JVD, lymphadenopathy.  CARDIAC:  Normal S1, S2.  Irregularly irregular rhythm with a 2/6  systolic ejection murmur heard best at the apex.  LUNGS:  Clear to auscultation bilaterally.  No wheezing, rhonchi or  rales.  ABDOMEN:  Soft, nontender.  EXTREMITIES:  Without edema.  NEUROLOGIC:  He is alert and oriented x3.  Cranial II-XII grossly  intact.  VASCULAR:  Carotids without bruits bilaterally.   STUDIES:  Electrocardiogram reveals atrial flutter with variable AV  block, heart rate of 72, normal axis, LVH, poor R wave progression.   IMPRESSION:  1. Atrial flutter with variable AV block with controlled ventricular      response.  2. Chest pain.  3. Hypertensive heart disease.  4. History of diastolic congestive heart failure.  A 2-D      echocardiogram March 2008 with an EF of 60%  5. End stage renal disease.      a.     Status post failed kidney transplantation.      b.     Hemodialysis  6. History of lower GI bleed, following polypectomy in April 2008.      a.     The patient required transfusion with 5 units packed  red       blood cells.      b.     EGD in the hospital negative for acute bleeding.  7. 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  8. Moderate to severe pulmonary hypertension.  Echocardiogram March      [id number] with a PA systolic pressure 50 mmHg.  9. Mild to moderate mitral regurgitation.  10.Remote history of SVT status post ablation 1995.  11.History of carotid stenosis (0-39% bilaterally by Dopplers      September 2007).   PLAN:  1. Mr. Eyer presents for follow-up.  He also complained of chest      pain.  His chest pain is quite atypical.  He does have risk factors      for coronary artery disease.  We will proceed with an adenosine      Cardiolite to rule out the possibility of significant ischemia      contributing to his symptoms.  His symptoms, however, overall sound      more consistent with a musculoskeletal etiology.  He already has      referral pending with orthopedics.  2. The patient presents today with atrial flutter.  It does not appear      that this has been documented in the past.  He is fairly      asymptomatic with this.  3. He has a history of GI bleed.  He may ultimately benefit from      Atrial Flutter ablation.  He would require Coumadin therapy for      this.  If he can not be ablated, he may require long term Coumadin      anticoagulation.  4. I have discussed his case with Dr. Dannielle Burn.  We will set him up for      referral to Gastroenterology to answer the question of whether or      not it is safe for him to proceed with Coumadin therapy.  5. We will set him up to see one of our electrophysiologists in      [place] to discuss possible Atrial Flutter ablation.  This will      be arranged in the next several weeks to allow him time to see  Gastroenterology first.  6. We will set him up for extensive blood work including C-met, CBC,      TSH and lipid panel.  7. He will undergo an echocardiogram to reassess his pulmonary       hypertension as well as a systolic murmur.  8. He will be set up for re-look carotid Dopplers to reassess his      carotid stenosis.  9. He will be placed on clonidine 0.1 mg b.i.d. for better blood      pressure control.  10.The patient will be set up for follow-up in the next 4-6 weeks.      Richardson Dopp, PA-C  Electronically Signed      Ernestine Mcmurray, MD,FACC  Electronically Signed   SW/MedQ  DD: 01/05/2008  DT: 01/05/2008  Job #: [id number]   cc:   Jerene Bears, MD  Alison Murray, M.D.

## 2010-12-18 NOTE — Op Note (Signed)
NAMECAANAN, VOIT              ACCOUNT NO.:  [id number]   MEDICAL RECORD NO.:  [id number]          PATIENT TYPE:  AMB   LOCATION:  DAY                           FACILITY:  [facility]   PHYSICIAN:  R. Garfield Cornea, M.D. DATE OF BIRTH:  [birth year]   DATE OF PROCEDURE:  04/14/2008  DATE OF DISCHARGE:                               OPERATIVE REPORT   PROCEDURE:  Esophagogastroduodenoscopy diagnostic.   INDICATIONS FOR PROCEDURE:  A 60 year old gentleman with a borderline  thrombocytopenia, a long history of distant alcohol abuse, has history  of atrial flutter, seen Dr. Dannielle Burn.  As I understand, anticoagulation  with Coumadin is being contemplated.  Currently, he is on an 81 mg  aspirin daily and we have been asked to characterize his risk of GI  bleeding.  He had a colonoscopy in last year by Dr. Laural Golden, had an  adenomatous polyp removed, and did had a postpolypectomy bleed.  He does  not have a family history or personal history of bleeding diathesis.  EGD is now being done to screen his upper GI tract for stigmata and more  advanced chronic liver disease.  At this point in time, he has normal  LFTs with albumin of 3.3.  Right upper quadrant ultrasound demonstrated  normal-appearing liver and no stigmata for portal hypertension.  His CBC  recently demonstrated a mild anemia of 12.0 and 37.4 and a platelet  count lower at limit of normal 153,000.  His protime was normal.  INR  1.0.  EGD is now being done.  This approach has been discussed with the  patient at length.  Risks, benefits, and alternatives have been  reviewed.  Please see documentation in the medical record.   PROCEDURE NOTE:  O2 saturation, blood pressure, pulse, and respirations  were monitored throughout the entirety of the procedure.   CONSCIOUS SEDATION:  Versed 4 mg IV and Demerol 100 mg IV in divided  doses.  Cetacaine spray for topical pharyngeal anesthesia.   INSTRUMENT:  Pentax video chip system.   FINDINGS:   Examination of the tubular esophagus revealed no mucosal  abnormalities.  EG junction was patulous and easily traversed.  Stomach:  Gastric cavity was emptied and insufflated well with air.  Thorough  examination of the gastric mucosa including retroflexed view of the  proximal stomach and esophagogastric junction demonstrated again a  patulous EG junction and a small hiatal hernia.  The patient had  multiple antral erosions.  There was no evidence of  portal gastropathy  or other gastric mucosal abnormality.  Pylorus was patent and easily  traversed.  Examination of the bulb and second portion revealed bulbar  erosions, multiple.  No ulcer or infiltrating process was seen.  Therapeutic/diagnostic maneuvers performed:  None.   The patient tolerated the procedure well and was reactive to endoscopy.   IMPRESSION:  A normal esophageal mucosa, normal esophageal varices,  patulous esophagogastric junction, small hiatal hernia, antral erosions,  otherwise unremarkable stomach, bulbar erosions; otherwise, normal D1  and D2.   RECOMMENDATIONS:  1. We will check H. pylori serologies.  2. The erosions seen on today's  exam could well be on the basis of      aspirin effect alone.  He has a lower limit normal platelet count,      but he appears not to have advanced chronic liver disease and      certainly no stigmata of portal hypertension, which is reassuring.      I see no absolute contraindication to anticoagulation, if that is      deemed necessary; however, there is no way to totally eliminate the      risk of bleeding with anticoagulation therapy, particularly if he      is on concomitant antiplatelet therapy.  Certainly, if he has H.      pylori, we would want to treat that and remove that factor from      this scenario based on history of colonic adenoma removed last year      I would recommend he return in 2013 for a colonoscopy for      surveillance purposes.      Bridgette Habermann,  M.D.  Electronically Signed     RMR/MEDQ  D:  04/14/2008  T:  04/15/2008  Job:  [id number]   cc:   Alison Murray, M.D.  Fax: [phone]   Jerene Bears, MD  Fax: [place], [address], [place] [address]

## 2010-12-18 NOTE — Assessment & Plan Note (Signed)
NAMEMarland Kitchen  Brian Liu, Brian Liu               CHART#:  [id number]   DATE:  03/11/2008                       DOB:  [birth year]   REFERRING PHYSICIANS:  1. Ernestine Mcmurray, MD, [facility].  2. Jerene Bears, MD.   REASON FOR CONSULTATION:  Assess risk for GI bleeding and history of  anticoagulation with Coumadin is being contemplated.   HISTORY OF PRESENT ILLNESS:  The patient is a very pleasant 60 year old  [place] American male from [place], [place], with multiple medical  problems including atrial flutter, atypical chest pain, and history of  hypertensive heart disease.  He has been seen by Dr. Dannielle Burn.   Our plans are for anticoagulation as well as possible ablative therapy  to treat his atrial flutter.   The patient has a history of a significant lower GI bleed in April of  last year for which she was hospitalized at [facility].  This was a post  polypectomy bleed requiring transfusion by history.   The records that do have available to me indicate that Dr. Collene Mares  performed a colonoscopy on 11/14/2006 and removed a 10 mm sessile polyp  from the proximal transverse colon.  She also had external hemorrhoids  but no other abnormalities.  This bleeding episode was felt to be a post  polypectomy phenomenon.   He poorly had an EGD over there that time as well without significant  findings (I do not have those results for review).  As far as the  patient is concerned, he denies reflux symptoms, odynophagia, dysphagia,  early satiety, nausea, vomiting, or change in weight.  He has a regular  appearing bowel movement 1 to 2 times daily.  He denies melena and  hematochezia.   We saw this nice gentleman back in 2008 for diarrhea.  We noted at that  time, he was mildly thrombocytopenic.  His stool studies came back  negative and his diarrhea resolved without specific therapy.   LABORATORY DATA:  I note his accompanying labs indicate he is mildly  leukopenic with a white count of 2.9.  He also has had a  mild anemia  with a hemoglobin of 11.8 and hematocrit 36.7 back on 01/19/2008.  He  also had a borderline low platelet count of 152,000.  His LFTs were  normal back in June of this year with the albumin of 3.5, SGOT of 14,  and SGPT of 13.  He did have a slightly elevated bilirubin of 1.1, which  was not fractionated.   PAST MEDICAL HISTORY:  Significant for end-stage chronic kidney disease,  now on hemodialysis and his kidney failure is felt to be secondary to  chronic glomerulonephritis.  He received a left kidney transplant  previously that did not work out.  He is back on hemodialysis;  hypertension; history of laryngeal carcinoma, status post radiation  treatment; and history of hyperlipidemia.   CURRENT MEDICATIONS:  Norvasc 10 mg daily, labetalol 200 mg tablets  b.i.d., Sensipar 30 mg daily, multivitamin daily, Fosrenol 1 g daily,  simvastatin 20 mg  at bedtime, minoxidil 2.5 mg daily, clonidine 0.1 mg  b.i.d., asa 81 mg daily, and zinc 50 mg daily.   ALLERGIES:  No known drug allergies.   FAMILY HISTORY:  Negative for chronic GI or liver illness, although  father did succumbed to some sort of intra-abdominal malignancy.  Further details unknown.   SOCIAL HISTORY:  The patient is widowed.  He has 2 children.  He has a  long history of heavy alcohol consumption but none in several years.  He  has no tobacco exposure.   REVIEW OF SYSTEMS:  He has lost 6 pounds since his visit here in March  2009.  No chest pain or dyspnea on exertion.  No fever or chills.   PHYSICAL EXAMINATION:  GENERAL:  A nice and pleasant 60 year old African  American male resting comfortably.  VITAL SIGNS:  Weight 161, height 6 feet 2 feet, temperature 98.5, BP  130/80, and pulse 72.  SKIN:  Warm and dry.  HEENT:  No scleral icterus.  CHEST:  Lungs are clear to auscultation.  CARDIAC:  Regular rate and rhythm without 2/6 systolic ejection murmur.  ABDOMEN:  Flat.  He has a left-sided surgical scar.   Positive bowel  sounds.  Abdomen is soft and nontender.  He does have a fullness under  the scar on the left side, which likely representing transplanted kidney  rather than spleen.  Liver is not enlarged by percussion but does  percuss 2 cm below the right costal margin.  EXTREMITIES:  No edema.  RECTAL:  No mass in rectal vault.  Good sphincter tone.  Scant brown  stool seen.  Hemoccult negative.   IMPRESSION:  Now, the patient is a pleasant 60 year old gentleman with  multiple medical problems.  He now has atrial flutter and  anticoagulation with Coumadin is now being contemplated.   He did have a significant lower gastrointestinal bleed, which sounds  like it was postpolypectomy bleed.  In April of last year, which should  be a self-limiting problem, and this should not be a recurrent issue  should he be anticoagulated in the future.  However, I am somewhat  concerned about his borderline thrombocytopenia and long-standing  alcohol abuse.  It is conceivable, he may have an occult liver disease  (i.e. cirrhosis).  I do not have his prior esophagogastroduodenoscopy  for review that was done early part of 2008.  I would give some  consideration to reassessing his upper gastrointestinal tract in the  near future via esophagogastroduodenoscopy just to screen him for  varices, etc., but before we would commit to that procedure I would like  to go ahead and check an INR now as well as a repeat CBC to see where he  stand as far as platelet count and get an indirect idea of hepatic  synthetic function.   I will make further specific recommendations regarding risk of  anticoagulation from a gastrointestinal standpoint in the very near  future.   I would like to thank Dr. [name] and Dr. Dannielle Burn for allowing me to see  this very nice gentleman today.       Bridgette Habermann, M.D.  Electronically Signed     RMR/MEDQ  D:  03/11/2008  T:  03/12/2008  Job:  [id number]   cc:   Ernestine Mcmurray,  MD,FACC  Jerene Bears, MD

## 2010-12-18 NOTE — Assessment & Plan Note (Signed)
[facility]                          [facility] OFFICE NOTE   [name], [name]                     MRN:          [id number]  DATE:02/29/2008                            DOB:          [birth year]    CARDIOLOGIST:  Ernestine Mcmurray, MD, [facility]   PRIMARY CARE PHYSICIAN:  Jerene Bears, MD   REASON FOR VISIT:  Six-week followup.   HISTORY OF PRESENT ILLNESS:  [name] is a 60 year old male patient  with a history of hypertensive heart disease whom I saw in followup on  January 05, 2008.  Please see that note for complete details.  Briefly, Mr.  Matzen returned to the office for followup and was noted to be in what  appeared to be atrial flutter with variable AV block.  He also had some  chest pain.  We arranged extensive workup and followup today.   The patient had a Cardiolite study done to work up his chest pain.  This  showed no evidence of ischemia.  He also had a followup echocardiogram  due to systolic murmur and history of pulmonary hypertension.  His  ejection fraction was 65%.  He had moderate concentric LVH and evidence  of hypertrophic cardiomyopathy, moderately dilated left atrium and mild  to moderately dilated right atrium, moderate mitral regurgitation, mild  to moderate tricuspid regurgitation, and RV systolic pressure of 51  mmHg.  The patient also had a history of minimal plaque by carotid  Dopplers in the past.  Followup carotid Dopplers revealed less than 50%  bilateral ICA stenoses.   His blood work revealed a normal TSH of 4.96.  His hemoglobin was 11.8.  MCV was 80.1.  His white count was noted to be low at 2900.  We had  originally asked that he follow up with his primary care physician.  He  has not yet done so.  His lipids were notable for an LDL of 115, HDL of  42, and his LFTs were okay.   The patient had a history of significant GI bleeding.  Our initial plan  was to have him see electrophysiology for consideration of  treatment of  his probable atrial flutter.  However, we sought the opinion of  gastroenterology first to determine whether or not it would be safe for  him to be on anticoagulation therapy.  He is due to see Dr. Gala Romney in  [place] on March 11, 2008.  We have not yet set up the appointment  with electrophysiology.   In the office today, the patient says he has been doing well.  He denies  chest pain or shortness breath.  He does note some hypotension at  dialysis.  Otherwise, he is doing well.   CURRENT MEDICATIONS:  1. Dialyvite.  2. Sensipar 30 mg daily.  3. Amlodipine 10 mg daily.  4. Labetalol 300 mg 2 tablets b.i.d.  5. Fosrenol 1000 mg with meals 3 times a day.  6. Minoxidil 2.5 mg daily.  7. Zinc.  8. Aspirin 81 mg daily.  9. Clonidine 0.1 mg b.i.d.   ALLERGIES:  No known drug allergies.  PHYSICAL EXAMINATION:  GENERAL:  He is a well-nourished, well-developed  male in no acute distress.  VITAL SIGNS:  Blood pressure is 147/88, pulse 73, and weight 163.6  pounds.  HEENT:  Normal.  NECK:  Without JVD.  CARDIAC:  Normal S1 and S2.  Irregularly irregular rhythm.  LUNGS:  Clear to auscultation bilaterally.  ABDOMEN:  Soft and nontender.  EXTREMITIES:  Without edema.  NEUROLOGIC:  He is alert and oriented x3.  Cranial II-XII are grossly  intact.   Electrocardiogram reveals atrial flutter with variable AV block versus  coarse AFib versus atrial tachycardia.  The heart rate is 67.  No  significant changes since previous tracing.   IMPRESSION:  1. Arrhythmia.      a.     Atrial flutter versus coarse atrial fibrillation versus       atrial tachycardia.  2. History of chest pain.      a.     Recent nonischemic Cardiolite study.  3. Good left ventricular function.  4. Hypertensive heart disease.  5. History of diastolic congestive heart failure.  6. End-stage renal disease, on hemodialysis.  7. History of lower gastrointestinal bleeding following polypectomy in       April 2008, requiring 5 units of packed red blood cells.  8. Laryngeal squamous cell carcinoma, status post radiation therapy.  9. Pulmonary hypertension.  10.Moderate mitral regurgitation.  11.Remote history of supraventricular tachycardia, status post      ablation in 1995.  12.Less than 50% bilateral internal carotid artery stenosis.  13.Dyslipidemia.  14.Leukopenia   PLAN:  1. [name] returns for followup.  Overall, he is doing well.  I      reviewed all of his testing with him today.  We are still awaiting      gastroenterology evaluation for his history of lower GI bleeding.      That will be in the first week of August 2009.  2. I reviewed the patient's electrocardiogram today with Dr. Domenic Polite.      It appears that his rhythm is likely not atrial flutter.  If it is      atrial flutter, it is atypical.  He likely has coarse atrial      fibrillation versus some type of atrial tachycardia.  In any event,      we will arrange referral to Dr. Virl Axe for EP evaluation.      This will be achieved after he sees gastroenterology.  If it is      deemed that Coumadin therapy would be safe in him, this will help      our therapeutic options for his arrhythmia.  3. He needs better control of his blood pressure.  We will increase      his minoxidil 5 mg a day.  We will check a followup BMET in 1      week's time.  4. The patient has leukopenia.  We will check a followup CBC with his      BMET, both to look at his white count as well as to reassess his      hemoglobin as he is now on aspirin therapy.  The patient has been      asked to follow up with Dr. [name] for further evaluation of his      leukopenia.  5. The patient would likely benefit from statin therapy given his      history of minimal carotid artery plaquing.  I placed him on  simvastatin 20 mg nightly.  We will recheck lipids and LFTs in 12      weeks' time.  6. The patient will follow up Dr. Dannielle Burn in the next 3  months or      sooner p.r.n.      Richardson Dopp, PA-C  Electronically Signed      Satira Sark, MD  Electronically Signed   SW/MedQ  DD: 02/29/2008  DT: 03/01/2008  Job #: [id number]   cc:   Jerene Bears, MD  Alison Murray, M.D.  Bridgette Habermann, M.D.

## 2010-12-20 ENCOUNTER — Ambulatory Visit: Payer: Self-pay | Admitting: Cardiology

## 2010-12-21 NOTE — Op Note (Signed)
Brian Liu, Brian Liu              ACCOUNT NO.:  [id number]   MEDICAL RECORD NO.:  [id number]          PATIENT TYPE:  AMB   LOCATION:  DAY                           FACILITY:  [facility]   PHYSICIAN:  Hildred Laser, M.D.    DATE OF BIRTH:  22-Nov-1950   DATE OF PROCEDURE:  11/14/2006  DATE OF DISCHARGE:                               OPERATIVE REPORT   PROCEDURE:  Colonoscopy with polypectomy.   INDICATIONS:  Brian Liu is a 60 year old, African-American male who was  recently seen in the office with diarrhea of several weeks' duration  which has finally resolved.  However, we noted he had history of polyps.  He had a 10 mm sessile adenoma removed from his sigmoid colon and at  [facility] in October 2001.  We therefore recommended surveillance  colonoscopy.  The procedure and risks were reviewed with the patient,  informed consent was obtained.   MEDS FOR CONSCIOUS SEDATION:  Demerol 50 mg IV, Versed 6 mg IV.   FINDINGS:  Procedure performed in endoscopy suite.  The patient's vital  signs and O2 sat were monitored during the procedure and remained  stable.  The patient was placed in the left lateral decubitus position  and a rectal examination performed.  No abnormality noted on external or  digital exam.  The Pentax videoscope was placed in the rectum and  advanced under direct vision into the sigmoid colon and beyond.  Preparation was satisfactory. The scope was passed into the cecum which  was identified by the appendiceal orifice and ileocecal valve.  Pictures  taken for the record.  As the scope was withdrawn, the colonic mucosa  was carefully examined.  There was a 10 mL sessile polyp at the proximal  transverse colon which was elevated with submucosal injection of normal  saline and snared in one piece.  The polypectomy was complete.  No  bleeding noted from polypectomy site.  This polyp was retrieved.  The  polypectomy site was clean.  The polyp was suctioned through the  channel. The rest  of the colonic mucosa was carefully examined and was  normal.  The rectal mucosa similarly was normal. The scope was  retroflexed to examine the anorectal junction and small hemorrhoids were  noted below the dentate line. The endoscope was straightened and  withdrawn.  The patient tolerated the procedure well.   FINAL DIAGNOSES:  1. Examination performed to the cecum.  2. 10 mm sessile polyp snared from proximal transverse colon following      submucosal injection of normal saline.  3. External hemorrhoids.   RECOMMENDATIONS:  No aspirin for 10 days.  He will resume his usual diet  and meds as before.  I will be contacting the patient with results of  biopsy.  Presuming this is another adenoma or tubulovillous adenoma, he  should return for repeat exam in 5 years from now.      Hildred Laser, M.D.  Electronically Signed     NR/MEDQ  D:  11/14/2006  T:  11/14/2006  Job:  [id number]   cc:   Alison Murray, M.D.  Fax: [phone]

## 2010-12-21 NOTE — Assessment & Plan Note (Signed)
[facility]                          [facility] NOTE   [name], [name]                       MRN:          [id number]  DATE:09/15/2006                            DOB:          25-Sep-1950    Brian Liu is a 61 year old male recently diagnosed with  possible laryngeal carcinoma. The patient has been scheduled for a  repeat biopsy as well as followed by possible laryngectomy. The patient  was previously seen by [place] in the hospital in December 2007 when he  presented with congestive heart failure. The patient has known pulmonary  hypertension with PA systolic pressure of approximately 75 mmHg. The  echocardiographic study also confirmed the presence of normal LV  function with left ventricular hypertrophy. The patient was  significantly hypertensive at that time and was subsequently treated  with medical therapy. He followed up in our office on August 04, 2006.  He was doing significantly better and his blood pressure was under  better control. In anticipation of surgery, we ordered a repeat  echocardiographic study to assess his pulmonary artery pressures.  Although technically difficult, they appeared to have improved and the  TR velocity jet did not appear to be greater than 3 meters per second.  The patient also is clinically stable and he is in no heart failure.   I called both Dr. Tamala Julian and the patient to notify them of about the  results of the echocardiographic study and the improved PA systolic  pressures. Although the patient remains at increased risk particularly  in regards to anesthesia due to his underlying pulmonary hypertension, I  do think there are little options at this point but to proceed with  surgery. The patient has a possible malignant carcinoma and may require  laryngectomy. I do not think his risk is prohibitive from a  cardiovascular perspective particularly now in light of his PA pressures  that have  improved. I conferred and discussed this with the patient and  he is comfortable with my recommendations.     Ernestine Mcmurray, MD,FACC  Electronically Signed    GED/MedQ  DD: 09/15/2006  DT: 09/16/2006  Job #: [id number]   cc:   Francene Finders, MD

## 2010-12-21 NOTE — Assessment & Plan Note (Signed)
[facility]                          [facility] NOTE   DEVION, SABAL                       MRN:          [id number]  DATE:08/04/2006                            DOB:          June 22, 1951    HISTORY OF PRESENT ILLNESS:  The patient is a 60 year old male recently  admitted with congestive heart failure.  The patient was also found to  have a systolic murmur consistent with mitral regurgitation.  He is  status post failed kidney transplant.  He is on hemodialysis.  An  echocardiographic study during that hospitalization demonstrated severe  left ventricular hypertrophy and hyperdynamic LV contractility.  He has  moderate mitral regurgitation with severe pulmonary hypertension, with  an estimated p-systolic pressure of 99991111 mgHg.   The patient states that he is not doing much better.  He is off the  minoxidil.  His blood pressure is under better control.  He denies any  orthopnea, PND, is able to walk several hundred feet without  difficulties.  He does report some cold symptoms and is reporting  hoarseness.   MEDICATIONS:  1. Labetalol 200 mg 2 tablets p.o. b.i.d.  2. Renagel 800 mg 3 tablets with meals and with snack.  3. Dialyvite.  4. Sensipar 30 mg every evening.  5. Amlodipine 10 mg p.o. daily.   PHYSICAL EXAMINATION:  VITAL SIGNS:  Blood pressure is 162/82, heart  rate 68 BPM, weight is 161 pounds.  NECK:  Normal carotid upstroke, left carotid bruit.  LUNGS:  Clear breath sounds bilaterally.  HEART:  Regular rate and rhythm, normal S1, S2.  There is an S4.  There  is a 3/6 holosystolic murmur.  ABDOMEN:  Soft.  EXTREMITY EXAM:  Revealed no cyanosis, clubbing or edema.   PROBLEM LIST:  1. Hypertensive heart disease with severe left ventricular      hypertrophy.  2. Hyperdynamic LV contractility, ejection fraction 60-65%.  3. Pulmonary hypertension, P-systolic pressure 99991111 mgHg.  4. Moderate mitral regurgitation.  5.  End stage renal disease.   PLAN:  1. I suspect the patient's hypertensive heart disease is secondary to      longstanding hypertension.  I do not think he has a truly genetic      variant of hypertrophic cardiomyopathy.  2. The patient asked to be continued on medical therapy.  We will      increase his Labetalol to 600 mg p.o. b.i.d. for better blood      pressure control.  3. The patient does have a left carotid bruit, but tells me that it      was evaluated previously with      carotid Doppler.  4. The patient to follow up with [place] in the next couple of months.  I      would focus on aggressive medical therapy of his hypertension.     Ernestine Mcmurray, MD,FACC  Electronically Signed    GED/MedQ  DD: 08/04/2006  DT: 08/04/2006  Job #: [id number]

## 2011-01-17 ENCOUNTER — Encounter: Payer: Self-pay | Admitting: Cardiology

## 2011-02-26 ENCOUNTER — Ambulatory Visit: Payer: Self-pay | Admitting: Cardiology

## 2011-03-15 ENCOUNTER — Ambulatory Visit: Payer: Self-pay | Admitting: Cardiology

## 2011-05-08 LAB — H. PYLORI ANTIBODY, IGG: H Pylori IgG: 0.9

## 2011-07-09 ENCOUNTER — Other Ambulatory Visit: Payer: Self-pay | Admitting: *Deleted

## 2011-07-09 MED ORDER — DILTIAZEM HCL ER COATED BEADS 120 MG PO CP24: 120.0000 mg | ORAL_CAPSULE | Freq: Every day | ORAL | Status: DC

## 2011-10-01 ENCOUNTER — Other Ambulatory Visit: Payer: Self-pay | Admitting: Internal Medicine

## 2011-10-02 ENCOUNTER — Encounter: Payer: Self-pay | Admitting: Internal Medicine

## 2011-10-02 DIAGNOSIS — I4891 Unspecified atrial fibrillation: Secondary | ICD-10-CM

## 2011-11-04 ENCOUNTER — Encounter: Payer: Medicare Other | Admitting: Physician Assistant

## 2011-11-07 ENCOUNTER — Encounter: Payer: Self-pay | Admitting: Physician Assistant

## 2011-11-07 ENCOUNTER — Ambulatory Visit (INDEPENDENT_AMBULATORY_CARE_PROVIDER_SITE_OTHER): Payer: Medicare Other | Admitting: Physician Assistant

## 2011-11-07 VITALS — BP 97/59 | HR 58 | Ht 74.0 in | Wt 169.8 lb

## 2011-11-07 DIAGNOSIS — I959 Hypotension, unspecified: Secondary | ICD-10-CM

## 2011-11-07 DIAGNOSIS — D61818 Other pancytopenia: Secondary | ICD-10-CM

## 2011-11-07 DIAGNOSIS — I080 Rheumatic disorders of both mitral and aortic valves: Secondary | ICD-10-CM

## 2011-11-07 DIAGNOSIS — I4891 Unspecified atrial fibrillation: Secondary | ICD-10-CM

## 2011-11-07 MED ORDER — LABETALOL HCL 100 MG PO TABS: 100.0000 mg | ORAL_TABLET | Freq: Two times a day (BID) | ORAL | Status: DC

## 2011-11-07 NOTE — Assessment & Plan Note (Signed)
Will decrease labetalol to 100 mg twice a day, in light of current hypotension and patient's recent history of significant dizziness, after taking his BP medications. We'll continue current dose of Cardizem for rate control.

## 2011-11-07 NOTE — Progress Notes (Signed)
HPI: Patient presents for post hospital followup from [facility], after presentation with C. difficile colitis. He was referred to [place] in consultation, given history of permanent atrial fibrillation and severe MR. He was previously deemed not a suitable candidate, secondary to history of LG IB. Of note, he was not on ASA prior to admission. Also, he presented with no cardiac symptoms. Medication adjustments notable for addition of Cardizem and up titration of labetalol. He was noted to have Pancytopenia, and we advised that he not be placed on ASA. He was referred for a Heme/Onc consultation, and is due to see Dr. Dina Rich in clinic tomorrow. A 2-D echo was obtained, yielding preserved LVF (EF 60-65%), with restrictive pattern; mild MR/moderate TR; and, severe pulmonary hypertension (65-70 mmHg).  Clinically, patient remains asymptomatic from a cardiac perspective. However, he does have dizziness some 15 minutes after taking his BP medications. Symptoms last for approximately 40 minutes. He denies frank syncope, but does feel fatigued. He did not have this feeling prior to his recent hospitalization.   No Known Allergies  Current Outpatient Prescriptions  Medication Sig Dispense Refill  . aspirin 81 MG tablet Take 81 mg by mouth daily.        . cinacalcet (SENSIPAR) 30 MG tablet Take 30 mg by mouth every other day.       . diltiazem (CARDIZEM CD) 120 MG 24 hr capsule Take 1 capsule (120 mg total) by mouth daily.  90 capsule  0  . labetalol (NORMODYNE) 200 MG tablet Take 200 mg by mouth 2 (two) times daily.      [name] lanthanum (FOSRENOL) 1000 MG chewable tablet Chew 1,000 mg by mouth 2 (two) times daily.        . minoxidil (LONITEN) 2.5 MG tablet Take 2.5 mg by mouth 2 (two) times daily.      . Multiple Vitamin (DAILY VITE) TABS Take by mouth. 1 with each meal and 5000mg  with snacks        . simvastatin (ZOCOR) 20 MG tablet Take 20 mg by mouth daily with supper.          Past Medical History  Diagnosis  Date  . Hypertension   . Pulmonary hypertension   . Mitral regurgitation   . Tobacco abuse   . Atrial fibrillation   . Renal disease   . Dyslipidemia   . Lower gastrointestinal bleed   . Hypertrophic cardiomyopathy     No past surgical history on file.  History   Social History  . Marital Status: Widowed    Spouse Name: N/A    Number of Children: N/A  . Years of Education: N/A   Occupational History  . Not on file.   Social History Main Topics  . Smoking status: Never Smoker   . Smokeless tobacco: Not on file  . Alcohol Use: Not on file  . Drug Use: Not on file  . Sexually Active: Not on file   Other Topics Concern  . Not on file   Social History Narrative  . No narrative on file    No family history on file.  ROS: no nausea, vomiting; no fever, chills; no melena, hematochezia; no claudication  PHYSICAL EXAM: BP 97/59  Pulse 58  Ht 6\' 2"  (1.88 m)  Wt 169 lb 12.8 oz (77.021 kg)  BMI 21.80 kg/m2 GENERAL: 61 year old male, sitting upright; NAD HEENT: NCAT, PERRLA, EOMI; sclera clear; no xanthelasma NECK: palpable bilateral carotid pulses, no bruits; no JVD; no TM LUNGS: CTA bilaterally CARDIAC:  irreg irreg; II-III/VI systolic murmur; no rubs or gallops ABDOMEN: soft, non-tender; intact BS EXTREMETIES: intact distal pulses; no significant peripheral edema SKIN: warm/dry; no obvious rash/lesions MUSCULOSKELETAL: no joint deformity NEURO: no focal deficit; NL affect   EKG:    ASSESSMENT & PLAN:

## 2011-11-07 NOTE — Assessment & Plan Note (Signed)
Adequate rate control on current dose of Cardizem and labetalol; however, will decrease labetalol, secondary to symptomatic hypotension. We'll continue same dose of Cardizem.

## 2011-11-07 NOTE — Assessment & Plan Note (Signed)
Assessed as mild, by recent echocardiogram

## 2011-11-07 NOTE — Assessment & Plan Note (Signed)
Will DC ASA. Recent followup CBC notable for a platelet count of 51, following admission level of 34. Of note, patient had a platelet count of 27, in September 2011. We have previously deemed him to not be a suitable candidate for Coumadin anticoagulation, given history of LGIB. At present, I recommend not treating with ASA, pending opinion from Dr. Dina Rich, with whom patient is scheduled to follow tomorrow. I also instructed the patient to discuss this issue with him, at time of his visit.

## 2011-11-07 NOTE — Patient Instructions (Signed)
Your physician wants you to follow-up in: 4 months. You will receive a reminder letter in the mail one-two months in advance. If you don't receive a letter, please call our office to schedule the follow-up appointment. Stop Aspirin. Decrease Labetalol to 100 mg two times a day. You may take _1/2_ of your _200_ mg tablets two times a day until gone, and then get new prescription filled for _100_ mg tablets. A new prescription was sent to your pharmacy to reflect this change.

## 2012-02-11 ENCOUNTER — Encounter: Payer: Self-pay | Admitting: Cardiology

## 2012-05-07 ENCOUNTER — Ambulatory Visit: Payer: Medicare Other | Admitting: Cardiology

## 2012-05-21 DIAGNOSIS — I10 Essential (primary) hypertension: Secondary | ICD-10-CM

## 2012-05-21 DIAGNOSIS — D472 Monoclonal gammopathy: Secondary | ICD-10-CM

## 2012-05-21 DIAGNOSIS — N19 Unspecified kidney failure: Secondary | ICD-10-CM

## 2012-05-21 DIAGNOSIS — D61818 Other pancytopenia: Secondary | ICD-10-CM

## 2012-06-02 ENCOUNTER — Encounter: Payer: Self-pay | Admitting: Cardiology

## 2012-06-02 DIAGNOSIS — I517 Cardiomegaly: Secondary | ICD-10-CM | POA: Insufficient documentation

## 2012-06-02 DIAGNOSIS — I5189 Other ill-defined heart diseases: Secondary | ICD-10-CM | POA: Insufficient documentation

## 2012-06-02 DIAGNOSIS — Z720 Tobacco use: Secondary | ICD-10-CM | POA: Insufficient documentation

## 2012-06-02 DIAGNOSIS — E785 Hyperlipidemia, unspecified: Secondary | ICD-10-CM | POA: Insufficient documentation

## 2012-06-02 DIAGNOSIS — I340 Nonrheumatic mitral (valve) insufficiency: Secondary | ICD-10-CM | POA: Insufficient documentation

## 2012-06-02 DIAGNOSIS — I272 Pulmonary hypertension, unspecified: Secondary | ICD-10-CM | POA: Insufficient documentation

## 2012-06-02 DIAGNOSIS — D61818 Other pancytopenia: Secondary | ICD-10-CM | POA: Insufficient documentation

## 2012-06-02 DIAGNOSIS — K922 Gastrointestinal hemorrhage, unspecified: Secondary | ICD-10-CM | POA: Insufficient documentation

## 2012-06-02 DIAGNOSIS — R9430 Abnormal result of cardiovascular function study, unspecified: Secondary | ICD-10-CM | POA: Insufficient documentation

## 2012-06-02 DIAGNOSIS — I10 Essential (primary) hypertension: Secondary | ICD-10-CM | POA: Insufficient documentation

## 2012-06-04 ENCOUNTER — Ambulatory Visit (INDEPENDENT_AMBULATORY_CARE_PROVIDER_SITE_OTHER): Payer: Medicare Other | Admitting: Cardiology

## 2012-06-04 ENCOUNTER — Encounter: Payer: Self-pay | Admitting: Cardiology

## 2012-06-04 VITALS — BP 152/89 | HR 89 | Ht 74.0 in | Wt 171.0 lb

## 2012-06-04 DIAGNOSIS — I5189 Other ill-defined heart diseases: Secondary | ICD-10-CM

## 2012-06-04 DIAGNOSIS — I272 Pulmonary hypertension, unspecified: Secondary | ICD-10-CM

## 2012-06-04 DIAGNOSIS — I10 Essential (primary) hypertension: Secondary | ICD-10-CM

## 2012-06-04 DIAGNOSIS — I340 Nonrheumatic mitral (valve) insufficiency: Secondary | ICD-10-CM

## 2012-06-04 DIAGNOSIS — D61818 Other pancytopenia: Secondary | ICD-10-CM

## 2012-06-04 DIAGNOSIS — Z8719 Personal history of other diseases of the digestive system: Secondary | ICD-10-CM

## 2012-06-04 DIAGNOSIS — I059 Rheumatic mitral valve disease, unspecified: Secondary | ICD-10-CM

## 2012-06-04 DIAGNOSIS — I2789 Other specified pulmonary heart diseases: Secondary | ICD-10-CM

## 2012-06-04 DIAGNOSIS — N186 End stage renal disease: Secondary | ICD-10-CM | POA: Insufficient documentation

## 2012-06-04 DIAGNOSIS — I4891 Unspecified atrial fibrillation: Secondary | ICD-10-CM

## 2012-06-04 DIAGNOSIS — I519 Heart disease, unspecified: Secondary | ICD-10-CM

## 2012-06-04 NOTE — Assessment & Plan Note (Signed)
Patient has chronic atrial fibrillation. The rate is controlled. He has not been on anticoagulation up to this point because of GI bleeding. I will need input from his other physicians to decide if and when anticoagulation can be considered.

## 2012-06-04 NOTE — Assessment & Plan Note (Signed)
The patient has significant pulmonary hypertension related to diastolic dysfunction. The key for his treatment will be to do the best with his blood pressure.

## 2012-06-04 NOTE — Assessment & Plan Note (Signed)
Patient has significant diastolic dysfunction. He needs to have good blood pressure control and good volume control.

## 2012-06-04 NOTE — Assessment & Plan Note (Signed)
There is a history of some GI bleeding in the past. The patient tells me this was a single occasion. Over time I'll need followup from his other doctors to see if he continues to have absolute contraindications to anticoagulation for his atrial fibrillation.

## 2012-06-04 NOTE — Patient Instructions (Addendum)

## 2012-06-04 NOTE — Assessment & Plan Note (Signed)
The patient has successful dialysis with a shunt in his left arm. No change in therapy.

## 2012-06-04 NOTE — Assessment & Plan Note (Signed)
Mitral regurgitation was only mild by echo 2013. No further workup.

## 2012-06-04 NOTE — Assessment & Plan Note (Signed)
Patient's blood pressure is reasonably controlled for him at this time. At the time of his last visit in April, 2013, his blood pressure was low and his meds were adjusted. No change in medicine today.

## 2012-06-04 NOTE — Progress Notes (Signed)
Patient ID: Brian Liu, male   DOB: Aug 27, 1950, 61 y.o.   MRN: [id number]   HPI Patient is seen back today to followup atrial fibrillation, hypertension, mitral regurgitation,. Additional problems include pancytopenia and end-stage renal disease treated with dialysis. Patient has significant pulmonary hypertension. We had seen him for cardiac evaluation while he was hospitalized early, 2013, with C. Difficile colitis. At that time it was felt he was not a Coumadin candidate. There had been some GI bleeding. Ejection fraction by echo was 60%. There was severe pulmonary hypertension.  The patient has been stable. He says the dialysis is going well. He's not having any significant chest pain or shortness of breath.  No Known Allergies  Current Outpatient Prescriptions  Medication Sig Dispense Refill  . cinacalcet (SENSIPAR) 30 MG tablet Take 30 mg by mouth every other day.       . diltiazem (CARDIZEM CD) 120 MG 24 hr capsule Take 1 capsule (120 mg total) by mouth daily.  90 capsule  0  . labetalol (NORMODYNE) 100 MG tablet Take 1 tablet (100 mg total) by mouth 2 (two) times daily.  180 tablet  3  . lanthanum (FOSRENOL) 1000 MG chewable tablet Chew 1,000 mg by mouth 2 (two) times daily.        . minoxidil (LONITEN) 2.5 MG tablet Take 2.5 mg by mouth 2 (two) times daily.      . Multiple Vitamin (DAILY VITE) TABS Take by mouth. 1 with each meal and 5000mg  with snacks        . simvastatin (ZOCOR) 20 MG tablet Take 20 mg by mouth daily with supper.          History   Social History  . Marital Status: Widowed    Spouse Name: N/A    Number of Children: N/A  . Years of Education: N/A   Occupational History  . Not on file.   Social History Main Topics  . Smoking status: Never Smoker   . Smokeless tobacco: Not on file  . Alcohol Use: Not on file  . Drug Use: Not on file  . Sexually Active: Not on file   Other Topics Concern  . Not on file   Social History Narrative  . No narrative  on file    No family history on file.  Past Medical History  Diagnosis Date  . Hypertension   . Pulmonary hypertension     Severe, PA pressure 65-70 mm of mercury, echo, 2013  . Mitral regurgitation     Mild by echo, 2013  . Tobacco abuse   . Atrial fibrillation     Permanent, Not on Coumadin because of history of lower GI bleed  . ESRD (end stage renal disease)     Dialysis, fistula in left arm  . Dyslipidemia   . Lower gastrointestinal bleed   . LVH (left ventricular hypertrophy)   . Pancytopenia     Dr Ignacia Bayley  . Ejection fraction     EF 60-65%, echo, 2013  . Diastolic dysfunction     Restrictive  diastolic filling pattern echo,  February, 2013    No past surgical history on file.  Patient Active Problem List  Diagnosis  . DYSLIPIDEMIA  . HYPERTENSION  . GASTROINTESTINAL HEMORRHAGE, HX OF  . Hypertension  . Pulmonary hypertension  . Mitral regurgitation  . Tobacco abuse  . Renal disease  . Dyslipidemia  . Lower gastrointestinal bleed  . Pancytopenia  . Ejection fraction  . Diastolic dysfunction  .  LVH (left ventricular hypertrophy)  . ESRD (end stage renal disease)    ROS  Patient denies fever, chills, headache, sweats, rash, change in vision, change in hearing, chest pain, cough, nausea vomiting, urinary symptoms. All other systems are reviewed and are negative.  PHYSICAL EXAM Patient is oriented to person time and place. Affect is normal. There is no jugulovenous distention. Lungs are clear. Respiratory effort is nonlabored. Cardiac exam reveals S1 and S2. There is a soft systolic murmur. The abdomen is soft. There is no peripheral edema. 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 However it is stable in use regularly in his left lower arm. There no musculoskeletal deformities. There are no skin rashes.  Filed Vitals:   06/04/12 1312  BP: 152/89  Pulse: 89  Height: 6\' 2"  (1.88 m)  Weight: 171 lb (77.565 kg)     ASSESSMENT & PLAN

## 2012-06-04 NOTE — Assessment & Plan Note (Signed)
The patient tells me that this problem is stable. I do not have specifics. This tissue may relate to the decisions for him to not be on anticoagulation for his atrial fibrillation.

## 2012-08-28 ENCOUNTER — Other Ambulatory Visit: Payer: Self-pay | Admitting: Cardiology

## 2012-12-01 ENCOUNTER — Encounter: Payer: Medicare Other | Admitting: Internal Medicine

## 2012-12-01 DIAGNOSIS — N186 End stage renal disease: Secondary | ICD-10-CM

## 2012-12-01 DIAGNOSIS — D696 Thrombocytopenia, unspecified: Secondary | ICD-10-CM

## 2012-12-01 DIAGNOSIS — D472 Monoclonal gammopathy: Secondary | ICD-10-CM

## 2013-01-14 ENCOUNTER — Encounter: Payer: Self-pay | Admitting: Cardiology

## 2013-01-14 ENCOUNTER — Ambulatory Visit (INDEPENDENT_AMBULATORY_CARE_PROVIDER_SITE_OTHER): Payer: Medicare Other | Admitting: Cardiology

## 2013-01-14 VITALS — BP 122/80 | HR 87 | Ht 74.0 in | Wt 175.0 lb

## 2013-01-14 DIAGNOSIS — I340 Nonrheumatic mitral (valve) insufficiency: Secondary | ICD-10-CM

## 2013-01-14 DIAGNOSIS — N186 End stage renal disease: Secondary | ICD-10-CM

## 2013-01-14 DIAGNOSIS — I059 Rheumatic mitral valve disease, unspecified: Secondary | ICD-10-CM

## 2013-01-14 DIAGNOSIS — D61818 Other pancytopenia: Secondary | ICD-10-CM

## 2013-01-14 DIAGNOSIS — I4891 Unspecified atrial fibrillation: Secondary | ICD-10-CM

## 2013-01-14 DIAGNOSIS — I10 Essential (primary) hypertension: Secondary | ICD-10-CM

## 2013-01-14 NOTE — Assessment & Plan Note (Signed)
Blood pressure is under good control. This is a very key issue for him.

## 2013-01-14 NOTE — Assessment & Plan Note (Signed)
His dialysis is going well. No change in therapy.

## 2013-01-14 NOTE — Assessment & Plan Note (Signed)
He is followed in the cancer clinic here in [place]. His doctor we'll be changing.

## 2013-01-14 NOTE — Progress Notes (Signed)
HPI   Patient is seen to followup atrial flutter ablation, hypertension, mitral regurgitation. He is stable. He's not having any chest pain. He does not have any significant palpitations. He is not anticoagulated. He's had significant GI bleeding in the past. He has good left jugular function. He says that his dialysis is going well.  No Known Allergies  Current Outpatient Prescriptions  Medication Sig Dispense Refill  . cinacalcet (SENSIPAR) 30 MG tablet Take 30 mg by mouth every other day.       . diltiazem (CARDIZEM CD) 120 MG 24 hr capsule Take 1 capsule (120 mg total) by mouth daily.  90 capsule  0  . labetalol (NORMODYNE) 100 MG tablet TAKE 1 BY MOUTH TWO TIMES  DAILY                      -EUGENE SERPE, PA-  180 tablet  1  . lanthanum (FOSRENOL) 500 MG chewable tablet Chew 500 mg by mouth 3 (three) times daily with meals. And 1 with snacks      . levothyroxine (SYNTHROID, LEVOTHROID) 125 MCG tablet Take 1 tablet by mouth daily.      . minoxidil (LONITEN) 2.5 MG tablet Take 2.5 mg by mouth 2 (two) times daily.      . Multiple Vitamin (DAILY VITE) TABS Take 1 tablet by mouth daily.       . simvastatin (ZOCOR) 20 MG tablet Take 20 mg by mouth daily with supper.         No current facility-administered medications for this visit.    History   Social History  . Marital Status: Widowed    Spouse Name: N/A    Number of Children: N/A  . Years of Education: N/A   Occupational History  . Not on file.   Social History Main Topics  . Smoking status: Never Smoker   . Smokeless tobacco: Never Used  . Alcohol Use: Not on file  . Drug Use: Not on file  . Sexually Active: Not on file   Other Topics Concern  . Not on file   Social History Narrative  . No narrative on file    No family history on file.  Past Medical History  Diagnosis Date  . Hypertension   . Pulmonary hypertension     Severe, PA pressure 65-70 mm of mercury, echo, 2013  . Mitral regurgitation     Mild by  echo, 2013  . Tobacco abuse   . Atrial fibrillation     Permanent, Not on Coumadin because of history of lower GI bleed  . ESRD (end stage renal disease)     Dialysis, fistula in left arm  . Dyslipidemia   . Lower gastrointestinal bleed   . LVH (left ventricular hypertrophy)   . Pancytopenia     Dr Ignacia Bayley  . Ejection fraction     EF 60-65%, echo, 2013  . Diastolic dysfunction     Restrictive  diastolic filling pattern echo,  February, 2013    History reviewed. No pertinent past surgical history.  Patient Active Problem List   Diagnosis Date Noted  . ESRD (end stage renal disease)   . Atrial fibrillation   . Hypertension   . Pulmonary hypertension   . Mitral regurgitation   . Tobacco abuse   . Dyslipidemia   . Lower gastrointestinal bleed   . Pancytopenia   . Ejection fraction   . Diastolic dysfunction   . LVH (left ventricular hypertrophy)   .  DYSLIPIDEMIA 04/18/2009    ROS   Patient denies fever, chills, headache, sweats, rash, change in vision, change in hearing, chest pain, cough, nausea vomiting, urinary symptoms. All other systems are reviewed and are negative.  PHYSICAL EXAM    Patient is oriented to person time and place. Affect is normal. There is no jugular venous distention. Lungs are clear. Respiratory effort is nonlabored. Cardiac exam reveals S1 and S2. There is a soft systolic murmur. The patient's shunt is in his left arm. There is no peripheral edema.  Filed Vitals:   01/14/13 1007  BP: 122/80  Pulse: 87  Height: 6\' 2"  (1.88 m)  Weight: 175 lb (79.379 kg)   EKG is done today and reviewed by me. There is old atrial fibrillation. There is old decreased anterior R wave progression. There is no diagnostic change.  ASSESSMENT & PLAN

## 2013-01-14 NOTE — Assessment & Plan Note (Signed)
He's had some mild mitral regurgitation in the past. He does not in follow up echo at this time.

## 2013-01-14 NOTE — Assessment & Plan Note (Signed)
There is chronic atrial fibrillation with controlled rate. He is not anticoagulated because of history of a lower GI bleed.

## 2013-01-14 NOTE — Patient Instructions (Addendum)
Your physician recommends that you schedule a follow-up appointment in: 8 months. You will receive a reminder letter in the mail in about 4-6 months reminding you to call and schedule your appointment. If you don't receive this letter, please contact our office. Your physician recommends that you continue on your current medications as directed. Please refer to the Current Medication list given to you today.

## 2013-02-20 ENCOUNTER — Other Ambulatory Visit: Payer: Self-pay | Admitting: Cardiology

## 2013-10-13 ENCOUNTER — Encounter: Payer: Self-pay | Admitting: Cardiology

## 2013-10-13 ENCOUNTER — Ambulatory Visit (INDEPENDENT_AMBULATORY_CARE_PROVIDER_SITE_OTHER): Payer: Medicare Other | Admitting: Cardiology

## 2013-10-13 VITALS — BP 116/75 | HR 83 | Ht 74.0 in | Wt 175.0 lb

## 2013-10-13 DIAGNOSIS — I10 Essential (primary) hypertension: Secondary | ICD-10-CM

## 2013-10-13 DIAGNOSIS — N186 End stage renal disease: Secondary | ICD-10-CM

## 2013-10-13 DIAGNOSIS — I4891 Unspecified atrial fibrillation: Secondary | ICD-10-CM

## 2013-10-13 NOTE — Assessment & Plan Note (Signed)
Blood pressure is controlled. No change in therapy. 

## 2013-10-13 NOTE — Progress Notes (Signed)
HPI  Patient is seen today to followup his atrial arrhythmias. He's quite stable. He had a flutter ablation in the past but he does have atrial fibrillation. He is not anticoagulated because of his history of a GI bleed. He some blood pressure medications. He's on dialysis. He stable and doing well.  No Known Allergies  Current Outpatient Prescriptions  Medication Sig Dispense Refill  . cinacalcet (SENSIPAR) 30 MG tablet Take 30 mg by mouth every other day.       . diltiazem (CARDIZEM CD) 120 MG 24 hr capsule Take 1 capsule (120 mg total) by mouth daily.  90 capsule  0  . labetalol (NORMODYNE) 100 MG tablet TAKE 1 BY MOUTH TWO TIMES  DAILY (EUGENE SERPE, PAC)  180 tablet  2  . lanthanum (FOSRENOL) 500 MG chewable tablet Chew 500 mg by mouth 3 (three) times daily with meals. And 1 with snacks      . levothyroxine (SYNTHROID, LEVOTHROID) 125 MCG tablet Take 1 tablet by mouth daily.      . minoxidil (LONITEN) 2.5 MG tablet Take 2.5 mg by mouth 2 (two) times daily.      . Multiple Vitamin (DAILY VITE) TABS Take 1 tablet by mouth daily.       . simvastatin (ZOCOR) 20 MG tablet Take 20 mg by mouth daily with supper.         No current facility-administered medications for this visit.    History   Social History  . Marital Status: Widowed    Spouse Name: N/A    Number of Children: N/A  . Years of Education: N/A   Occupational History  . Not on file.   Social History Main Topics  . Smoking status: Never Smoker   . Smokeless tobacco: Never Used  . Alcohol Use: Not on file  . Drug Use: Not on file  . Sexual Activity: Not on file   Other Topics Concern  . Not on file   Social History Narrative  . No narrative on file    No family history on file.  Past Medical History  Diagnosis Date  . Hypertension   . Pulmonary hypertension     Severe, PA pressure 65-70 mm of mercury, echo, 2013  . Mitral regurgitation     Mild by echo, 2013  . Tobacco abuse   . Atrial fibrillation      Permanent, Not on Coumadin because of history of lower GI bleed  . ESRD (end stage renal disease)     Dialysis, fistula in left arm  . Dyslipidemia   . Lower gastrointestinal bleed   . LVH (left ventricular hypertrophy)   . Pancytopenia     Dr Ignacia Bayley  . Ejection fraction     EF 60-65%, echo, 2013  . Diastolic dysfunction     Restrictive  diastolic filling pattern echo,  February, 2013    No past surgical history on file.  Patient Active Problem List   Diagnosis Date Noted  . ESRD (end stage renal disease)   . Atrial fibrillation   . Hypertension   . Pulmonary hypertension   . Mitral regurgitation   . Tobacco abuse   . Dyslipidemia   . Lower gastrointestinal bleed   . Pancytopenia   . Ejection fraction   . Diastolic dysfunction   . LVH (left ventricular hypertrophy)     ROS   Patient denies fever, chills, headache, sweats, rash, change in vision, change in hearing, chest pain, cough, nausea vomiting,  urinary symptoms. All other systems are reviewed and are negative.  PHYSICAL EXAM  Patient is oriented to person time and place. Affect is normal. He has poor dentition. Lungs are clear. Respiratory effort is nonlabored. Cardiac exam her vitals S1 and S2. The rhythm is irregularly irregular. The rate is controlled. Dialysis site on his left arm is bandaged. Abdomen is soft. There is no peripheral edema.  Filed Vitals:   10/13/13 1257  BP: 116/75  Pulse: 83  Height: 6\' 2"  (1.88 m)  Weight: 175 lb (79.379 kg)  SpO2: 99%     ASSESSMENT & PLAN

## 2013-10-13 NOTE — Assessment & Plan Note (Signed)
Atrial fibrillation is controlled. I have carefully reviewed once again the question of anticoagulation. With end-stage renal disease he is not a candidate for a NOAC. We are not using Coumadin because of his prior bleeding. Considering all factors I have decided not to change the approach at this time.

## 2013-10-13 NOTE — Patient Instructions (Signed)
Your physician recommends that you schedule a follow-up appointment in: 9 months. You will receive a reminder letter in the mail in about 7 months reminding you to call and schedule your appointment. If you don't receive this letter, please contact our office. Your physician recommends that you continue on your current medications as directed. Please refer to the Current Medication list given to you today.

## 2013-10-13 NOTE — Assessment & Plan Note (Signed)
Dialysis is stable. No change in therapy.

## 2013-11-17 ENCOUNTER — Other Ambulatory Visit: Payer: Self-pay | Admitting: Cardiology

## 2014-05-10 ENCOUNTER — Other Ambulatory Visit (HOSPITAL_COMMUNITY): Payer: Medicare Other

## 2014-05-10 ENCOUNTER — Ambulatory Visit: Payer: Medicare Other | Admitting: Vascular Surgery

## 2014-05-11 ENCOUNTER — Other Ambulatory Visit: Payer: Self-pay

## 2014-05-11 DIAGNOSIS — M79642 Pain in left hand: Secondary | ICD-10-CM

## 2014-05-11 DIAGNOSIS — T82510A Breakdown (mechanical) of surgically created arteriovenous fistula, initial encounter: Secondary | ICD-10-CM

## 2014-05-16 ENCOUNTER — Encounter: Payer: Self-pay | Admitting: Vascular Surgery

## 2014-05-17 ENCOUNTER — Ambulatory Visit (HOSPITAL_COMMUNITY)
Admission: RE | Admit: 2014-05-17 | Discharge: 2014-05-17 | Disposition: A | Discharge status: Home / Self Care | Payer: Medicare Other | Source: Ambulatory Visit | Attending: Vascular Surgery | Admitting: Vascular Surgery

## 2014-05-17 ENCOUNTER — Encounter: Payer: Self-pay | Admitting: Vascular Surgery

## 2014-05-17 ENCOUNTER — Ambulatory Visit (INDEPENDENT_AMBULATORY_CARE_PROVIDER_SITE_OTHER): Payer: Medicare Other | Admitting: Vascular Surgery

## 2014-05-17 VITALS — BP 145/100 | HR 72 | Temp 98.2°F | Resp 16 | Ht 74.0 in | Wt 168.0 lb

## 2014-05-17 DIAGNOSIS — N186 End stage renal disease: Secondary | ICD-10-CM

## 2014-05-17 DIAGNOSIS — T82898S Other specified complication of vascular prosthetic devices, implants and grafts, sequela: Secondary | ICD-10-CM

## 2014-05-17 DIAGNOSIS — M79642 Pain in left hand: Secondary | ICD-10-CM

## 2014-05-17 DIAGNOSIS — T829XXS Unspecified complication of cardiac and vascular prosthetic device, implant and graft, sequela: Secondary | ICD-10-CM

## 2014-05-17 DIAGNOSIS — T829XXA Unspecified complication of cardiac and vascular prosthetic device, implant and graft, initial encounter: Secondary | ICD-10-CM | POA: Insufficient documentation

## 2014-05-17 DIAGNOSIS — T82510A Breakdown (mechanical) of surgically created arteriovenous fistula, initial encounter: Secondary | ICD-10-CM

## 2014-05-17 NOTE — Progress Notes (Signed)
Subjective:     Patient ID: Brian Liu, male   DOB: July 20, 1951, 63 y.o.   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 Patient has had fistula for 23 years without problems. 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 He has never had bleeding or infection from the fistula. He has recently noted some burning discomfort along the medial aspect of the second and third fingers. He denies pain in the left hand and denies numbness in the left hand. This is the only access he has ever had.  Past Medical History  Diagnosis Date  . Hypertension   . Pulmonary hypertension     Severe, PA pressure 65-70 mm of mercury, echo, 2013  . Mitral regurgitation     Mild by echo, 2013  . Tobacco abuse   . Atrial fibrillation     Permanent, Not on Coumadin because of history of lower GI bleed  . ESRD (end stage renal disease)     Dialysis, fistula in left arm  . Dyslipidemia   . Lower gastrointestinal bleed   . LVH (left ventricular hypertrophy)   . Pancytopenia     Dr Ignacia Bayley  . Ejection fraction     EF 60-65%, echo, 2013  . Diastolic dysfunction     Restrictive  diastolic filling pattern echo,  February, 2013    History  Substance Use Topics  . Smoking status: Never Smoker   . Smokeless tobacco: Never Used  . Alcohol Use: No    Family History  Problem Relation Age of Onset  . Cancer Father     No Known Allergies  Current outpatient prescriptions:cinacalcet (SENSIPAR) 30 MG tablet, Take 30 mg by mouth every other day. , Disp: , Rfl: ;  diltiazem (CARDIZEM CD) 120 MG 24 hr capsule, Take 1 capsule (120 mg total) by mouth daily., Disp: 90 capsule, Rfl: 0;  labetalol (NORMODYNE) 100 MG tablet, TAKE 1 BY MOUTH TWO TIMES  DAILY (EUGENE SERPE, PAC), Disp: 180 tablet, Rfl: 2 lanthanum (FOSRENOL) 500 MG chewable  tablet, Chew 500 mg by mouth 3 (three) times daily with meals. And 1 with snacks, Disp: , Rfl: ;  levothyroxine (SYNTHROID, LEVOTHROID) 125 MCG tablet, Take 1 tablet by mouth daily., Disp: , Rfl: ;  minoxidil (LONITEN) 2.5 MG tablet, Take 2.5 mg by mouth 2 (two) times daily., Disp: , Rfl: ;  Multiple Vitamin (DAILY VITE) TABS, Take 1 tablet by mouth daily. , Disp: , Rfl:  simvastatin (ZOCOR) 20 MG tablet, Take 20 mg by mouth daily with supper.  , Disp: , Rfl:   BP 145/100  Pulse 72  Temp(Src) 98.2 F (36.8 C) (Oral)  Resp 16  Ht 6\' 2"  (1.88 m)  Wt 168 lb (76.204 kg)  BMI 21.56 kg/m2  SpO2 100%  Body mass index is 21.56 kg/(m^2).          Review of Systems chest pain, dyspnea on exertion, PND, orthopnea, hemoptysis, or claudication     Objective:   Physical Exam BP 145/100  Pulse 72  Temp(Src) 98.2 F (36.8 C) (Oral)  Resp 16  Ht 6\' 2"  (1.88 m)  Wt 168 lb (76.204 kg)  BMI 21.56 kg/m2  SpO2 100%  General well-developed well-nourished male no apparent stress alert and oriented x3 Lungs no rhonchi or wheezing Upper extremity with functioning  radial/cephalic AV fistula. 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 Excellent pulse and palpable thrill. Left and well perfused.  Today I ordered a duplex scan of the fistula and to check for steal syndrome. The maximum diameter of the aneurysms is a 2.5 cm. There is excellent flow throughout. There is some retrograde flow in the radial artery with the fistula open which becomes antegrade with compression of the fistula.      Assessment:     Do not think patient is having significant steal syndrome particularly  after 23 years of functioning AV fistula No need for treatment of these aneurysms which are quite stable and had had no history of bleeding or infection    Plan:     Return to see [place] on when necessary basis

## 2014-07-12 ENCOUNTER — Ambulatory Visit (INDEPENDENT_AMBULATORY_CARE_PROVIDER_SITE_OTHER): Payer: Medicare Other | Admitting: Cardiology

## 2014-07-12 ENCOUNTER — Encounter: Payer: Self-pay | Admitting: Cardiology

## 2014-07-12 VITALS — BP 127/84 | HR 77 | Ht 74.0 in | Wt 161.8 lb

## 2014-07-12 DIAGNOSIS — K852 Alcohol induced acute pancreatitis without necrosis or infection: Secondary | ICD-10-CM

## 2014-07-12 DIAGNOSIS — I270 Primary pulmonary hypertension: Secondary | ICD-10-CM

## 2014-07-12 DIAGNOSIS — I10 Essential (primary) hypertension: Secondary | ICD-10-CM

## 2014-07-12 DIAGNOSIS — I272 Pulmonary hypertension, unspecified: Secondary | ICD-10-CM

## 2014-07-12 DIAGNOSIS — I4891 Unspecified atrial fibrillation: Secondary | ICD-10-CM

## 2014-07-12 NOTE — Progress Notes (Signed)
Patient ID: Brian Liu, male   DOB: 22-Mar-1951, 63 y.o.   MRN: [id number]    HPI Patient is seen today to follow-up history of hypertensive cardiovascular disease. He has atrial fibrillation. He has not been anticoagulated with a history of GI bleeding. He is on dialysis and doing well. In the past he had alcoholic pancreatitis. He stopped drinking a long time ago and has not had any recurring pancreatitis. He does have significant pulmonary hypertension that has been thought related to hypertensive cardiovascular disease along with mitral regurgitation. He's feeling well.  No Known Allergies  Current Outpatient Prescriptions  Medication Sig Dispense Refill  . cinacalcet (SENSIPAR) 30 MG tablet Take 30 mg by mouth daily.     . folic acid (FOLVITE) 1 MG tablet Take 1 mg by mouth daily.    [name] labetalol (NORMODYNE) 100 MG tablet TAKE 1 BY MOUTH TWO TIMES  DAILY (EUGENE SERPE, PAC) 180 tablet 2  . lanthanum (FOSRENOL) 500 MG chewable tablet Chew 1,000 mg by mouth 3 (three) times daily with meals. And 1 with snacks    . levothyroxine (SYNTHROID, LEVOTHROID) 137 MCG tablet Take 137 mcg by mouth daily before breakfast.    . minoxidil (LONITEN) 2.5 MG tablet Take 2.5 mg by mouth 2 (two) times daily.    . Multiple Vitamin (DAILY VITE) TABS Take 1 tablet by mouth daily.     . simvastatin (ZOCOR) 20 MG tablet Take 20 mg by mouth daily with supper.       No current facility-administered medications for this visit.    History   Social History  . Marital Status: Widowed    Spouse Name: N/A    Number of Children: N/A  . Years of Education: N/A   Occupational History  . Not on file.   Social History Main Topics  . Smoking status: Never Smoker   . Smokeless tobacco: Never Used  . Alcohol Use: No  . Drug Use: No  . Sexual Activity: Not on file   Other Topics Concern  . Not on file   Social History Narrative    Family History  Problem Relation Age of Onset  . Cancer Father     Past  Medical History  Diagnosis Date  . Hypertension   . Pulmonary hypertension     Severe, PA pressure 65-70 mm of mercury, echo, 2013  . Mitral regurgitation     Mild by echo, 2013  . Tobacco abuse   . Atrial fibrillation     Permanent, Not on Coumadin because of history of lower GI bleed  . ESRD (end stage renal disease)     Dialysis, fistula in left arm  . Dyslipidemia   . Lower gastrointestinal bleed   . LVH (left ventricular hypertrophy)   . Pancytopenia     Dr Ignacia Bayley  . Ejection fraction     EF 60-65%, echo, 2013  . Diastolic dysfunction     Restrictive  diastolic filling pattern echo,  February, 2013    Past Surgical History  Procedure Laterality Date  . Av fistula placement Left 1992    Left arm   . 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 Alcoholic pancreatitis [id number]  . Complication of dialysis access insertion 05/17/2014  . End stage renal disease 05/17/2014  . ESRD (end stage renal disease)   . Atrial fibrillation   .  Hypertension   . Pulmonary hypertension   . Mitral regurgitation   . Tobacco abuse   . Dyslipidemia   . Lower gastrointestinal bleed   . Pancytopenia   . Ejection fraction   . Diastolic dysfunction   . LVH (left ventricular hypertrophy)     ROS  Patient denies fever, chills, headache, sweats, rash, change in vision, change in hearing, chest pain, cough, nausea vomiting, urinary symptoms. All other systems are reviewed and are negative.  PHYSICAL EXAM Patient is stable. He is oriented to person time and place. Affect is normal. Head is atraumatic. Sclerae and conjunctivae are normal. Lungs are clear. Respiratory effort is nonlabored. Cardiac exam reveals S1 and S2. The rhythm is irregularly irregular. His dialysis site in his left arm is stable. Abdomen is soft. There is no peripheral edema.  Filed Vitals:   07/12/14 1348  BP: 127/84    Pulse: 77  Height: 6\' 2"  (1.88 m)  Weight: 161 lb 12.8 oz (73.392 kg)  SpO2: 97%     ASSESSMENT & PLAN

## 2014-07-12 NOTE — Assessment & Plan Note (Signed)
Blood pressures controlled. No change in therapy. 

## 2014-07-12 NOTE — Patient Instructions (Signed)
Continue all current medications. Your physician wants you to follow up in: 6 months.  You will receive a reminder letter in the mail one-two months in advance.  If you don't receive a letter, please call our office to schedule the follow up appointment   

## 2014-07-12 NOTE — Assessment & Plan Note (Signed)
Rate is controlled. He is not anticoagulated because of a history of prior GI bleeding.

## 2014-08-10 ENCOUNTER — Other Ambulatory Visit: Payer: Self-pay | Admitting: Cardiology

## 2014-08-16 ENCOUNTER — Ambulatory Visit (INDEPENDENT_AMBULATORY_CARE_PROVIDER_SITE_OTHER): Payer: Medicare Other | Admitting: Neurology

## 2014-08-16 ENCOUNTER — Ambulatory Visit: Payer: Medicare Other | Admitting: Neurology

## 2014-08-16 ENCOUNTER — Encounter: Payer: Self-pay | Admitting: Neurology

## 2014-08-16 VITALS — BP 128/84 | HR 107 | Ht 74.0 in | Wt 170.1 lb

## 2014-08-16 DIAGNOSIS — N186 End stage renal disease: Secondary | ICD-10-CM

## 2014-08-16 DIAGNOSIS — Z992 Dependence on renal dialysis: Secondary | ICD-10-CM

## 2014-08-16 DIAGNOSIS — R200 Anesthesia of skin: Secondary | ICD-10-CM

## 2014-08-16 DIAGNOSIS — G63 Polyneuropathy in diseases classified elsewhere: Secondary | ICD-10-CM

## 2014-08-16 DIAGNOSIS — G629 Polyneuropathy, unspecified: Secondary | ICD-10-CM | POA: Diagnosis not present

## 2014-08-16 DIAGNOSIS — G5612 Other lesions of median nerve, left upper limb: Secondary | ICD-10-CM

## 2014-08-16 NOTE — Progress Notes (Signed)
Note faxed.

## 2014-08-16 NOTE — Progress Notes (Signed)
[facility] Clinic Note - Initial Visit   Date: 08/16/2014  Brian Liu MRN: [id number] DOB: 1951-05-26   Dear Dr. [name]:   Thank you for your kind referral of Brian Liu for consultation of left hand paresthesias. Although his history is well known to you, please allow [place] to reiterate it for the purpose of our medical record. The patient was accompanied to the clinic by self.    History of Present Illness: Brian Liu is a 64 y.o. left-handed African American male with hypertension, pulmonary hypertension, atrial fibrillation (not on anticoagulation due to GI bleed), hyperlipidemia, pancytopenia, ESRD on HD via left forarm AV fistula (MWF), laryngeal cancer s/p resection and radiation (2011) presenting for evaluation of left hand tingling.  Start in November 2015, he developed numbness and tingling of his left middle finger and then slowly started to involve his index and thumb.  He was referred to his vascular specialist who did not find any problems with his AV fistula (actively using 1992).  Symptoms are constant and not improved or worsened by anything.  He is taking neurontin 300mg  TID without significant benefit.  He is still able to grasp objects but unable to feel the sensation very well.  No sensory complaints of the left forearm or right hand.    He reports having similar symptoms several years ago and and underwent EMG which was reportedly normal.    He was evaluated by his PCP on 06/24/2014 for left arm paresthesias, whose clinic note was reviewed.  Past Medical History  Diagnosis Date  . Hypertension   . Pulmonary hypertension     Severe, PA pressure 65-70 mm of mercury, echo, 2013  . Mitral regurgitation     Mild by echo, 2013  . Tobacco abuse   . Atrial fibrillation     Permanent, Not on Coumadin because of history of lower GI bleed  . ESRD (end stage renal disease)     Dialysis, fistula in left arm  . Dyslipidemia   .  Lower gastrointestinal bleed   . LVH (left ventricular hypertrophy)   . Pancytopenia     Dr Ignacia Bayley  . Ejection fraction     EF 60-65%, echo, 2013  . Diastolic dysfunction     Restrictive  diastolic filling pattern echo,  February, 2013    Past Surgical History  Procedure Laterality Date  . Av fistula placement Left 1992    Left arm   . 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 folic acid (FOLVITE) 1 MG tablet Take 1 mg by mouth daily.    [name] labetalol (NORMODYNE) 100 MG tablet TAKE 1 BY MOUTH TWO TIMES  DAILY 180 tablet 3  . lanthanum (FOSRENOL) 500 MG chewable tablet Chew 1,000 mg by mouth 3 (three) times daily with meals. And 1 with snacks    . levothyroxine (SYNTHROID, LEVOTHROID) 137 MCG tablet Take 137 mcg by mouth daily before breakfast.    . minoxidil (LONITEN) 2.5 MG tablet Take 2.5 mg by mouth 2 (two) times daily.    . Multiple Vitamin (DAILY VITE) TABS Take 1 tablet by mouth daily.     . simvastatin (ZOCOR) 20 MG tablet Take 20 mg by mouth daily with supper.       No current facility-administered medications on file prior to visit.  Allergies: No Known Allergies  Family History: Family History  Problem Relation Age of Onset  . Stomach cancer Father     Deceased, 34  . Other Mother     Deceased, 46  . Kidney failure Son   . Healthy Son     Social History: History   Social History  . Marital Status: Widowed    Spouse Name: N/A    Number of Children: N/A  . Years of Education: N/A   Occupational History  . Not on file.   Social History Main Topics  . Smoking status: Never Smoker   . Smokeless tobacco: Never Used  . Alcohol Use: No  . Drug Use: No  . Sexual Activity: Not on file   Other Topics Concern  . Not on file   Social History Narrative   Lives alone in a one story home.  Has 2 sons.      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     Education high school.     Review of Systems:  CONSTITUTIONAL: No fevers, chills, night sweats, or weight loss.   EYES: No visual changes or eye pain ENT: No hearing changes.  No history of nose bleeds.   RESPIRATORY: No cough, wheezing and shortness of breath.   CARDIOVASCULAR: Negative for chest pain, and palpitations.   GI: Negative for abdominal discomfort, blood in stools or black stools.  No recent change in bowel habits.   GU:  No history of incontinence.   MUSCLOSKELETAL: +history of joint pain or swelling.  No myalgias.   SKIN: Negative for lesions, rash, and itching.   HEMATOLOGY/ONCOLOGY: Negative for prolonged bleeding, bruising easily, and swollen nodes.  +history of cancer.   ENDOCRINE: Negative for cold or heat intolerance, polydipsia or goiter.   PSYCH:  No depression or anxiety symptoms.   NEURO: As Above.   Vital Signs:  BP 128/84 mmHg  Pulse 107  Ht 6\' 2"  (1.88 m)  Wt 170 lb 2 oz (77.168 kg)  BMI 21.83 kg/m2  SpO2 97%  General Medical Exam:   General:  Well appearing, comfortable.   Eyes/ENT: see cranial nerve examination.   Neck: No masses appreciated.  Full range of motion without tenderness.  No carotid bruits. Respiratory:  Clear to auscultation, good air entry bilaterally.   Cardiac:  Regular rate and rhythm, no murmur.   Extremities:  Large left AV fistula over the left lateral forearm extending into the proximal arm and left wrist.   Skin:  No rashes or lesions.  Neurological Exam: MENTAL STATUS including orientation to time, place, person, recent and remote memory, attention span and concentration, language, and fund of knowledge is normal.  Speech is not dysarthric.  CRANIAL NERVES: II:  No visual field defects.  Unremarkable fundi.   III-IV-VI: Pupils equal round and reactive to light.  Normal conjugate, extra-ocular eye movements in all directions of gaze.  No  nystagmus.  Subtle left ptosis.   V:  Normal facial sensation.     VII:  Normal facial symmetry and movements.   VIII:  Normal hearing and vestibular function.   IX-X:  Normal palatal movement.   XI:  Normal shoulder shrug and head rotation.   XII:  Normal tongue strength and range of motion, no deviation or fasciculation.  MOTOR:  Moderate ABP atrophy on the left.  No fasciculations or abnormal movements.  No pronator drift.  Tone is normal.    Right Upper Extremity:  Left Upper Extremity:    Deltoid  5/5   Deltoid  5/5   Biceps  5/5   Biceps  5/5   Triceps  5/5   Triceps  5/5   Wrist extensors  5/5   Wrist extensors  5/5   Wrist flexors  5/5   Wrist flexors  5/5   Finger extensors  5/5   Finger extensors  5/5   Finger flexors  5/5   Finger flexors  5/5   Dorsal interossei  5/5   Dorsal interossei  5/5   Abductor pollicis  5/5   Abductor pollicis  4/5   Tone (Ashworth scale)  0  Tone (Ashworth scale)  0   Right Lower Extremity:    Left Lower Extremity:    Hip flexors  5/5   Hip flexors  5/5   Hip extensors  5/5   Hip extensors  5/5   Knee flexors  5/5   Knee flexors  5/5   Knee extensors  5/5   Knee extensors  5/5   Dorsiflexors  5/5   Dorsiflexors  5/5   Plantarflexors  5/5   Plantarflexors  5/5   Toe extensors  5/5   Toe extensors  5/5   Toe flexors  5/5   Toe flexors  5/5   Tone (Ashworth scale)  0  Tone (Ashworth scale)  0   MSRs:  Right                                                                 Left brachioradialis 2+  brachioradialis 2+  biceps 2+  biceps 2+  triceps 2+  triceps 2+  patellar 2+  patellar 2+  ankle jerk 1+  ankle jerk 1+  Hoffman no  Hoffman no  plantar response down  plantar response down   SENSORY:  Reduced temperature and hyperesthesia to pin prick over the left median distribution. Sensation in the right upper and bilateral lower extremities is intact, except for reduced vibration distally in the feet.  Romberg's sign present.    COORDINATION/GAIT: Normal finger-to- nose-finger and heel-to-shin.  Intact rapid alternating movements bilaterally.  Able to rise from a chair without using arms.  Gait narrow based and stable. He is unsteady with tandem gait, but able to perform heel and toe walking.  EMG 08/16/2014: 1. 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  These findings are severe in degree electrically and worse on the left. 2. A superimposed median neuropathy affecting the left upper extremity is likely, given the preferential loss of abductor pollicis brevis motor amplitude, however this cannot be localized based on nerve conduction studies alone. Clinical correlation recommended.  IMPRESSION: Mr. Caulk is a 64 year-old gentleman with ESRD on HD via left forearm fistula presenting for evaluation of left hand paresthesias.  Exam is notable for left ABP weakness and sensory deficits following the median nerve distribution.    EMG was performed in the office of the left upper extremity shows a severe peripheral neuropathy with notable absence of motor amplitude involving median-innervated abductor pollicis brevis, suggestive of underlying severe left median neuropathy.  Needle electrode examination was not performed due to bleeding risks associated with his  extensive AV fistula, so further localization cannot be made. With the relative preservation of strength of flexor pollicis longus and pronator teres muscles, my suspicion for entrapment at the wrist is high, however localized vascular compression is also possible.  Ultrasound of the median nerve will be obtained to better visualize the anatomical structure of the median nerve.    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      PLAN/RECOMMENDATIONS:  1.  Ultrasound of the left median nerve 2.  Start using wrist splint 3.  Take gabapenint 300mg  three times daily, ok to add one extra dose as needed for severe pain 4.  Based on the results of your ultrasound, we may consider starting occupational therapy on your hand 5.  Return to clinic in 6 weeks    The duration of this appointment visit was 70 minutes of face-to-face time with the patient.  Greater than 50% of this time was spent in counseling, explanation of diagnosis, planning of further management, and coordination of care.   Thank you for allowing me to participate in patient's care.  If I can answer any additional questions, I would be pleased to do so.    Sincerely,    Liann Spaeth K. Posey Pronto, DO

## 2014-08-16 NOTE — Procedures (Signed)
[facility]  [address], [address]  [place], [place] [address] Tel: [phone] Fax:  [phone] Test Date:  08/16/2014  Patient: Brian Liu DOB: 09-13-1950 Physician: Narda Amber, DO  Sex: Male Height: 6\' 2"  Ref Phys: Narda Amber  ID#: [id number] Temp: 33.2C Technician:    Patient Complaints: This is a 64 year-old gentleman who is ESRD on HD via left forerarm fistula presenting for evaluation of left hand paresthesias involving the 1st-3rd digits.  NCV & EMG Findings: Electrodiagnostic testing of the left upper extremity was limited to nerve conduction studies only, given the presence of a large arteriovenous fistula in the left forearm. Findings are as follows.  1. Left median, ulnar, and radial sensory responses are absent. Right median, ulnar, and radial sensory responses are reduced in amplitude. 2. Left median motor response is essentially absent. Bilateral ulnar motor responses in the right median motor responses within normal limits. Of note, bilateral ulnar motor studies show mildly reduced conduction velocity slowing across its course.  Impression: 1. 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  These findings are severe in degree electrically and worse on the left. 2. A superimposed median neuropathy affecting the left upper extremity is likely, given the preferential loss of abductor pollicis brevis motor amplitude, however this cannot be localized based on nerve conduction studies alone. Clinical correlation recommended.   ___________________________ Narda Amber, DO    Nerve Conduction Studies Anti Sensory Summary Table   Site NR Peak (ms) Norm Peak (ms) P-T Amp (V) Norm P-T Amp  Left Median Anti Sensory (2nd Digit)  Wrist NR  <3.8  >10  Right Median Anti Sensory (2nd Digit)  Wrist    4.7 <3.8 5.6 >10  Left Radial  Anti Sensory (Base 1st Digit)  Wrist NR  <2.8  >10  Right Radial Anti Sensory (Base 1st Digit)  Wrist    3.7 <2.8 5.9 >10  Left Ulnar Anti Sensory (5th Digit)  Wrist NR  <3.2  >5  Right Ulnar Anti Sensory (5th Digit)  Wrist    4.6 <3.2 3.5 >5   Motor Summary Table   Site NR Onset (ms) Norm Onset (ms) O-P Amp (mV) Norm O-P Amp Site1 Site2 Delta-0 (ms) Dist (cm) Vel (m/s) Norm Vel (m/s)  Left Median Motor (Abd Poll Brev)  Wrist    9.5 <4.0 0.6 >5 Elbow Wrist  0.0  >50  Elbow NR            Right Median Motor (Abd Poll Brev)  Wrist    3.9 <4.0 14.3 >5 Elbow Wrist 6.6 33.0 50 >50  Elbow    10.5  12.2         Left Ulnar Motor (Abd Dig Minimi)  Wrist    3.0 <3.1 8.1 >7 B Elbow Wrist 5.4 27.0 50 >50  B Elbow    8.4  7.4  A Elbow B Elbow 2.3 11.0 48 >50  A Elbow    10.7  7.3         Right Ulnar Motor (Abd Dig Minimi)  Wrist    4.1 <3.1 8.3 >7 B Elbow Wrist 5.4 26.0 48 >50  B Elbow    9.5  7.9  A Elbow B Elbow 2.3 11.0 48 >50  A Elbow    11.8  7.3             Waveforms:

## 2014-08-16 NOTE — Patient Instructions (Addendum)
1.  Ultrasound of the left median nerve 2.  Start using wrist splint 3.  Take gabapenint 300mg  three times daily, ok to add one extra dose as needed for severe pain 4.  Based on the results of your ultrasound, we may consider starting occupational therapy on your hand 5.  Return to clinic in 6 weeks

## 2014-08-25 ENCOUNTER — Telehealth: Payer: Self-pay | Admitting: Neurology

## 2014-08-25 ENCOUNTER — Encounter: Payer: Self-pay | Admitting: *Deleted

## 2014-08-25 NOTE — Telephone Encounter (Signed)
Nancy at [employer] can not get a hold of patient and so they can't sch appt nancy phone number is [phone]

## 2014-08-25 NOTE — Telephone Encounter (Signed)
I also attempted to contact patient.  Wrong #.  Called emergency contact and got a [employer] message-- unable to leave message.  Called Izora Gala and she suggested that we schedule the appointment out so that patient will have time to contact us.  I will mail a letter giving patient the appointment information.   Appointment is on March 1 at 9:30.

## 2014-08-29 NOTE — Telephone Encounter (Signed)
Pt called and wanted to know the status I got the correct phone number and called nancy to give it to her

## 2014-09-27 ENCOUNTER — Ambulatory Visit: Payer: Medicare Other | Admitting: Neurology

## 2014-10-05 ENCOUNTER — Telehealth: Payer: Self-pay | Admitting: Neurology

## 2014-10-05 NOTE — Telephone Encounter (Signed)
Nerve ultrasound results from [facility] received dated 10/04/2014:  No mass lesion associated with median nerve root displacement of the nerve by a mass or patient's known AV fistula. Flattening of the median nerve with decreasing cross-section measurement of the nerve just deep to the flexor retinaculum.  Donika K. Posey Pronto, DO

## 2014-10-05 NOTE — Telephone Encounter (Signed)
Patient notified

## 2014-10-18 ENCOUNTER — Ambulatory Visit (INDEPENDENT_AMBULATORY_CARE_PROVIDER_SITE_OTHER): Payer: Medicare Other | Admitting: Neurology

## 2014-10-18 ENCOUNTER — Encounter: Payer: Self-pay | Admitting: Neurology

## 2014-10-18 VITALS — BP 120/76 | HR 85 | Ht 74.0 in | Wt 174.1 lb

## 2014-10-18 DIAGNOSIS — G5612 Other lesions of median nerve, left upper limb: Secondary | ICD-10-CM

## 2014-10-18 DIAGNOSIS — N186 End stage renal disease: Secondary | ICD-10-CM

## 2014-10-18 DIAGNOSIS — Z992 Dependence on renal dialysis: Secondary | ICD-10-CM

## 2014-10-18 DIAGNOSIS — G63 Polyneuropathy in diseases classified elsewhere: Secondary | ICD-10-CM

## 2014-10-18 MED ORDER — LIDOCAINE 5 % EX OINT: TOPICAL_OINTMENT | CUTANEOUS | Status: DC

## 2014-10-18 NOTE — Progress Notes (Signed)
Follow-up Visit   Date: 10/18/2014   Brian Liu MRN: [id number] DOB: 1951-07-31   Interim History: Brian Liu is a 64 y.o. left-handed African American male with hypertension, pulmonary hypertension, atrial fibrillation (not on anticoagulation due to GI bleed), hyperlipidemia, pancytopenia, ESRD on HD via left forarm AV fistula (MWF), laryngeal cancer s/p resection and radiation (2011) returning to the clinic for follow-up of left hand tingling and pain.  The patient was accompanied to the clinic by self.  History of present illness: Start in November 2015, he developed numbness and tingling of his left middle finger and then slowly started to involve his index and thumb. He was referred to his vascular specialist who did not find any problems with his AV fistula (actively using 1992). Symptoms are constant and not improved or worsened by anything. He is taking neurontin 300mg  TID without significant benefit. He is still able to grasp objects but unable to feel the sensation very well. No sensory complaints of the left forearm or right hand.  He reports having similar symptoms several years ago and and underwent EMG which was reportedly normal.  He was evaluated by his PCP on 06/24/2014 for left arm paresthesias, whose clinic note was reviewed.  UPDATE 10/18/2014:  At patient's last visit, NCS of the left hand showed severe generalized polyneuropathy with likely superimposed left median neuropathy.  Because needle examination was not performed due to AVF in the same region, US of the median nerve was ordered and showed reduced cross sectional area as the nerve travels below the flexor retinaculum, consistent with carpal tunnel syndrome.  Patient denies any weakness of the hands, but the burning pain involving his first three fingers is most bothersome. He is taking gabapentin 300mg  TID without any relief.  No new complaints.  He denies any neck pain.   Medications:    Current Outpatient Prescriptions on File Prior to Visit  Medication Sig Dispense Refill  . AZOPT 1 % ophthalmic suspension   4  . bimatoprost (LUMIGAN) 0.03 % ophthalmic solution 1 drop.    . cinacalcet (SENSIPAR) 30 MG tablet Take 30 mg by mouth.    . folic acid (FOLVITE) 1 MG tablet Take 1 mg by mouth daily.    [name] gabapentin (NEURONTIN) 300 MG capsule Take 300 mg by mouth 3 (three) times daily.  1  . labetalol (NORMODYNE) 100 MG tablet TAKE 1 BY MOUTH TWO TIMES  DAILY 180 tablet 3  . lanthanum (FOSRENOL) 500 MG chewable tablet Chew 1,000 mg by mouth 3 (three) times daily with meals. And 1 with snacks    . latanoprost (XALATAN) 0.005 % ophthalmic solution Place 1 drop into both eyes at bedtime.  6  . levothyroxine (SYNTHROID, LEVOTHROID) 137 MCG tablet Take 137 mcg by mouth daily before breakfast.    . minoxidil (LONITEN) 2.5 MG tablet Take 2.5 mg by mouth 2 (two) times daily.    . Multiple Vitamin (DAILY VITE) TABS Take 1 tablet by mouth daily.     . simvastatin (ZOCOR) 20 MG tablet Take 20 mg by mouth daily with supper.      [name] UNABLE TO FIND Take 1 tablet by mouth. Dailyvite 800 mg     No current facility-administered medications on file prior to visit.    Allergies: No Known Allergies  Review of Systems:  CONSTITUTIONAL: No fevers, chills, night sweats, or weight loss.  EYES: No visual changes or eye pain ENT: No hearing changes.  No history of nose  bleeds.   RESPIRATORY: No cough, wheezing and shortness of breath.   CARDIOVASCULAR: Negative for chest pain, and palpitations.   GI: Negative for abdominal discomfort, blood in stools or black stools.  No recent change in bowel habits.   GU:  No history of incontinence.   MUSCLOSKELETAL: No history of joint pain or swelling.  No myalgias.   SKIN: + lesions, rash, and itching.   ENDOCRINE: Negative for cold or heat intolerance, polydipsia or goiter.   PSYCH:  No depression or anxiety symptoms.   NEURO: As Above.   Vital Signs:   BP 120/76 mmHg  Pulse 85  Ht 6\' 2"  (1.88 m)  Wt 174 lb 1 oz (78.954 kg)  BMI 22.34 kg/m2  SpO2 97%  Neurological Exam: MENTAL STATUS including orientation to time, place, person, recent and remote memory, attention span and concentration, language, and fund of knowledge is normal.  Speech is not dysarthric.  CRANIAL NERVES:    Face is symmetric.  MOTOR:  Moderate left ABP atrophy.  Motor strength is 5/5 in all extremities, except left ABP 4+/5.   Tone is normal.    MSRs:  Reflexes are 2+/4 throughout, except 1+ at the Achilles.  SENSORY:  Reduced temperature and hyperesthesia to pin prick over the left median distribution.  COORDINATION/GAIT:    Gait narrow based and stable.   Data: EMG 08/16/2014: 1. 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 These findings are severe in degree electrically and worse on the left. 2. A superimposed median neuropathy affecting the left upper extremity is likely, given the preferential loss of abductor pollicis brevis motor amplitude, however this cannot be localized based on nerve conduction studies alone. Clinical correlation recommended.  NM median nerve ultrasound 10/04/2014: No mass lesion associated with median nerve root displacement of the nerve by a mass or patient's known AV fistula. Flattening of the median nerve with decreasing cross-section measurement of the nerve just deep to the flexor retinaculum.  IMPRESSION: Mr. Trundy is a 64 year-old gentleman with ESRD on HD via left forearm fistula returning for evaluation of left hand paresthesias. Exam is notable for left ABP weakness and sensory deficits following the median nerve distribution. Nerve conduction studies and ultrasound of the median nerve also suggest carpal tunnel syndrome affecting the left wrist.  We discussed management options including referral to  occupational therapy and/or orthopeadic surgery, but patient denies any weakness and is not interested in surgery. I also do not think he would be a good surgical candidate because of risks of bleeding due to location of his AV fistula.  Nerve ultrasound did not show vascular infiltration of the nerve by the AVF.  At this juncture, management is symptomatic for his painful paresthesias.  I will increase his neurontin slowly and also start him on lidocaine ointment to see if this helps.    PLAN/RECOMMENDATIONS:  1.  Continue gabapentin 300mg  three times daily. OK to take extra tablet after each dialysis 2.  Start lidocaine ointment to left fingers twice daily as needed 3.  If symptoms worsen, we can order MRI of the cervical spine 4.  Continue to use wrist splint 5.  Return to clinic 54-months  The duration of this appointment visit was 25 minutes of face-to-face time with the patient.  Greater than 50% of this time was spent in counseling, explanation of diagnosis, planning of further management, and coordination of care.   Thank  you for allowing me to participate in patient's care.  If I can answer any additional questions, I would be pleased to do so.    Sincerely,    Brian Domanski K. Posey Pronto, DO

## 2014-10-18 NOTE — Patient Instructions (Addendum)
1.  Continue gabapentin 300mg  three times daily. OK to take extra tablet after each dialysis 2.  Start lidocaine ointment to left fingers twice daily as needed 3.  If symptoms worsen, we can order MRI of the cervical spine 4.  Continue to use wrist splint 5.  Return to clinic 22-months

## 2014-10-18 NOTE — Progress Notes (Signed)
Note faxed.

## 2014-11-03 ENCOUNTER — Encounter: Payer: Self-pay | Admitting: Neurology

## 2015-01-19 ENCOUNTER — Ambulatory Visit (INDEPENDENT_AMBULATORY_CARE_PROVIDER_SITE_OTHER): Payer: Medicare Other | Admitting: Cardiology

## 2015-01-19 ENCOUNTER — Encounter: Payer: Self-pay | Admitting: Neurology

## 2015-01-19 ENCOUNTER — Ambulatory Visit (INDEPENDENT_AMBULATORY_CARE_PROVIDER_SITE_OTHER): Payer: Medicare Other | Admitting: Neurology

## 2015-01-19 ENCOUNTER — Encounter: Payer: Self-pay | Admitting: Cardiology

## 2015-01-19 VITALS — BP 110/84 | HR 85 | Wt 160.0 lb

## 2015-01-19 VITALS — HR 102 | Temp 97.7°F | Ht 74.0 in | Wt 164.3 lb

## 2015-01-19 DIAGNOSIS — I482 Chronic atrial fibrillation, unspecified: Secondary | ICD-10-CM

## 2015-01-19 DIAGNOSIS — G63 Polyneuropathy in diseases classified elsewhere: Secondary | ICD-10-CM | POA: Diagnosis not present

## 2015-01-19 DIAGNOSIS — I5189 Other ill-defined heart diseases: Secondary | ICD-10-CM

## 2015-01-19 DIAGNOSIS — N186 End stage renal disease: Secondary | ICD-10-CM

## 2015-01-19 DIAGNOSIS — I519 Heart disease, unspecified: Secondary | ICD-10-CM

## 2015-01-19 DIAGNOSIS — Z992 Dependence on renal dialysis: Secondary | ICD-10-CM | POA: Diagnosis not present

## 2015-01-19 DIAGNOSIS — I10 Essential (primary) hypertension: Secondary | ICD-10-CM

## 2015-01-19 DIAGNOSIS — G5612 Other lesions of median nerve, left upper limb: Secondary | ICD-10-CM

## 2015-01-19 DIAGNOSIS — I272 Pulmonary hypertension, unspecified: Secondary | ICD-10-CM

## 2015-01-19 DIAGNOSIS — I270 Primary pulmonary hypertension: Secondary | ICD-10-CM

## 2015-01-19 MED ORDER — GABAPENTIN 300 MG PO CAPS: 300.0000 mg | ORAL_CAPSULE | Freq: Three times a day (TID) | ORAL | Status: DC

## 2015-01-19 NOTE — Progress Notes (Signed)
Cardiology Office Note   Date:  01/19/2015   ID:  Brian Liu, DOB April 04, 1951, MRN [id number]  PCP:  [name]., MD  Cardiologist:  Dola Argyle, MD   Chief Complaint  Patient presents with  . Appointment    Follow-up hypertensive cardiovascular disease      History of Present Illness: Brian Liu is a 64 y.o. male who presents today to follow-up hypertensive cardiovascular disease. I saw the patient last December, 2015. He is on chronic dialysis. He has been relatively stable. He has chronic atrial fibrillation area he cannot be anticoagulated because of prior GI bleeding. He does have severe pulmonary hypertension that has been felt related to his diastolic dysfunction. He has severe diastolic dysfunction. He has mild shortness of breath. In the remote past he had alcoholic pancreatitis. He stopped drinking many many years ago and has not been having any recurrent pancreatitis. He is very reliable with his dialysis.  Past Medical History  Diagnosis Date  . Hypertension   . Pulmonary hypertension     Severe, PA pressure 65-70 mm of mercury, echo, 2013  . Mitral regurgitation     Mild by echo, 2013  . Tobacco abuse   . Atrial fibrillation     Permanent, Not on Coumadin because of history of lower GI bleed  . ESRD (end stage renal disease)     Dialysis, fistula in left arm  . Dyslipidemia   . Lower gastrointestinal bleed   . LVH (left ventricular hypertrophy)   . Pancytopenia     Dr Ignacia Bayley  . Ejection fraction     EF 60-65%, echo, 2013  . Diastolic dysfunction     Restrictive  diastolic filling pattern echo,  February, 2013    Past Surgical History  Procedure Laterality Date  . Av fistula placement Left 1992    Left arm   . 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 Alcoholic pancreatitis [id number]  . Complication of dialysis access insertion  05/17/2014  . End stage renal disease 05/17/2014  . ESRD (end stage renal disease)   . Atrial fibrillation   . Hypertension   . Pulmonary hypertension   . Mitral regurgitation   . Tobacco abuse   . Dyslipidemia   . Lower gastrointestinal bleed   . Pancytopenia   . Ejection fraction   . Diastolic dysfunction   . LVH (left ventricular hypertrophy)       Current Outpatient Prescriptions  Medication Sig Dispense Refill  . AZOPT 1 % ophthalmic suspension   4  . bimatoprost (LUMIGAN) 0.03 % ophthalmic solution 1 drop.    . cinacalcet (SENSIPAR) 30 MG tablet Take 30 mg by mouth.    . folic acid (FOLVITE) 1 MG tablet Take 1 mg by mouth daily.    [name] FOSRENOL 1000 MG chewable tablet     . gabapentin (NEURONTIN) 300 MG capsule Take 1 capsule (300 mg total) by mouth 3 (three) times daily. 300 capsule 3  . labetalol (NORMODYNE) 100 MG tablet TAKE 1 BY MOUTH TWO TIMES  DAILY 180 tablet 3  . lanthanum (FOSRENOL) 500 MG chewable tablet Chew 1,000 mg by mouth 3 (three) times daily with meals. And 1 with snacks    . latanoprost (XALATAN) 0.005 % ophthalmic solution Place 1 drop into both eyes at bedtime.  6  . levothyroxine (SYNTHROID, LEVOTHROID) 137 MCG  tablet Take 137 mcg by mouth daily before breakfast.    . minoxidil (LONITEN) 2.5 MG tablet Take 2.5 mg by mouth 2 (two) times daily.    . Multiple Vitamin (DAILY VITE) TABS Take 1 tablet by mouth daily.     [name] SIMBRINZA 1-0.2 % SUSP INSTILL 1 DROP IN INTO BOTH EYES TWICE A DAY  6  . simvastatin (ZOCOR) 20 MG tablet Take 20 mg by mouth daily with supper.      [name] UNABLE TO FIND Take 1 tablet by mouth. Dailyvite 800 mg     No current facility-administered medications for this visit.    Allergies:   Review of patient's allergies indicates no known allergies.    Social History:  The patient  reports that he has never smoked. He has never used smokeless tobacco. He reports that he does not drink alcohol or use illicit drugs.   Family History:  The  patient's family history includes Healthy in his son; Kidney failure in his son; Other in his mother; Stomach cancer in his father.    ROS:  Please see the history of present illness.    Patient denies fever, chills, headache, sweats, rash, change in vision, change in hearing, chest pain, cough, nausea or vomiting, urinary symptoms. All other systems are reviewed and are negative.    PHYSICAL EXAM: VS:  BP 110/84 mmHg  Pulse 85  Wt 160 lb (72.576 kg)  SpO2 98% , patient is oriented to person time and place. Affect is normal. He is thin but stable. Head is atraumatic. Sclera and conjunctiva are normal per there is no jugular venous distention. Lungs reveal a few scattered rhonchi. Cardiac exam reveals S1 and S2. The rhythm is irregularly irregular. The rate is controlled. There is a systolic murmur. He has a shunt in his left arm. Abdomen is soft. There is no peripheral edema. There are no musculoskeletal deformities. There are no skin rashes.  EKG:   EKG is not done today.   Recent Labs: No results found for requested labs within last 365 days.    Lipid Panel No results found for: CHOL, TRIG, HDL, CHOLHDL, VLDL, LDLCALC, LDLDIRECT    Wt Readings from Last 3 Encounters:  01/19/15 160 lb (72.576 kg)  01/19/15 164 lb 5 oz (74.532 kg)  10/18/14 174 lb 1 oz (78.954 kg)      Current medicines are reviewed  The patient understands his medications.    ASSESSMENT AND PLAN:

## 2015-01-19 NOTE — Assessment & Plan Note (Signed)
Patient has severe diastolic dysfunction. It is felt that this may play a role with his pulmonary hypertension.

## 2015-01-19 NOTE — Patient Instructions (Signed)
1.  Stop lidocaine ointment.  If you noticed that it helped your symptoms, you can try a lower dose over the counter in Aspercream 2.  Continue gabapentin 300mg  three times daily 3.  Return to clinic as needed

## 2015-01-19 NOTE — Progress Notes (Signed)
Note sent

## 2015-01-19 NOTE — Assessment & Plan Note (Signed)
The patient is dialyzed from his left arm fistula.

## 2015-01-19 NOTE — Assessment & Plan Note (Signed)
Blood pressures controlled. No change in therapy. 

## 2015-01-19 NOTE — Patient Instructions (Signed)
Your physician recommends that you continue on your current medications as directed. Please refer to the Current Medication list given to you today. Your physician recommends that you schedule a follow-up appointment in: 7 months. You will receive a reminder letter in the mail in about 4 months reminding you to call and schedule your appointment. If you don't receive this letter, please contact our office.

## 2015-01-19 NOTE — Assessment & Plan Note (Signed)
Patient has chronic atrial fibrillation. The rate is controlled. He is not anticoagulated because of his history of GI bleeding.

## 2015-01-19 NOTE — Assessment & Plan Note (Signed)
There is a history of significant pulmonary hypertension. History of clear but has been thought related to hypertensive cardiovascular disease with severe diastolic dysfunction. No further workup at this time.

## 2015-01-19 NOTE — Progress Notes (Signed)
Follow-up Visit   Date: 01/19/2015   Brian Liu MRN: [id number] DOB: 1950/12/28   Interim History: Brian Liu is a 64 y.o. left-handed African American male with hypertension, pulmonary hypertension, atrial fibrillation (not on anticoagulation due to GI bleed), hyperlipidemia, pancytopenia, ESRD on HD via left forarm AV fistula (MWF), laryngeal cancer s/p resection and radiation (2011) returning to the clinic for follow-up of left hand tingling and pain.  The patient was accompanied to the clinic by self.  History of present illness: Start in November 2015, he developed numbness and tingling of his left middle finger and then slowly started to involve his index and thumb. He was referred to his vascular specialist who did not find any problems with his AV fistula (actively using 1992). Symptoms are constant and not improved or worsened by anything. He is taking neurontin 300mg  TID without significant benefit. He is still able to grasp objects but unable to feel the sensation very well. No sensory complaints of the left forearm or right hand.  He reports having similar symptoms several years ago and and underwent EMG which was reportedly normal.  He was evaluated by his PCP on 06/24/2014 for left arm paresthesias, whose clinic note was reviewed.  UPDATE 10/18/2014:  At patient's last visit, NCS of the left hand showed severe generalized polyneuropathy with likely superimposed left median neuropathy.  Because needle examination was not performed due to AVF in the same region, US of the median nerve was ordered and showed reduced cross sectional area as the nerve travels below the flexor retinaculum, consistent with carpal tunnel syndrome.  Patient denies any weakness of the hands, but the burning pain involving his first three fingers is most bothersome. He is taking gabapentin 300mg  TID without any relief.  No new complaints.  He denies any neck pain.  UPDATE 01/19/2015:   There is no longer burning pain of his hand after using the lidocaine ointment, but he has persistent numbness which is more bothersome because he can't feel things.  He prefers the pain over the numbness and would like to consider decreasing his medication.  No new weakness or neck pain.  He underwent rotator cuff surgery on May 17th and is doing well.    Medications:  Current Outpatient Prescriptions on File Prior to Visit  Medication Sig Dispense Refill  . AZOPT 1 % ophthalmic suspension   4  . bimatoprost (LUMIGAN) 0.03 % ophthalmic solution 1 drop.    . cinacalcet (SENSIPAR) 30 MG tablet Take 30 mg by mouth.    . folic acid (FOLVITE) 1 MG tablet Take 1 mg by mouth daily.    [name] labetalol (NORMODYNE) 100 MG tablet TAKE 1 BY MOUTH TWO TIMES  DAILY 180 tablet 3  . lanthanum (FOSRENOL) 500 MG chewable tablet Chew 1,000 mg by mouth 3 (three) times daily with meals. And 1 with snacks    . latanoprost (XALATAN) 0.005 % ophthalmic solution Place 1 drop into both eyes at bedtime.  6  . levothyroxine (SYNTHROID, LEVOTHROID) 137 MCG tablet Take 137 mcg by mouth daily before breakfast.    . minoxidil (LONITEN) 2.5 MG tablet Take 2.5 mg by mouth 2 (two) times daily.    . Multiple Vitamin (DAILY VITE) TABS Take 1 tablet by mouth daily.     . simvastatin (ZOCOR) 20 MG tablet Take 20 mg by mouth daily with supper.      [name] UNABLE TO FIND Take 1 tablet by mouth. Dailyvite 800 mg  No current facility-administered medications on file prior to visit.    Allergies: No Known Allergies  Review of Systems:  CONSTITUTIONAL: No fevers, chills, night sweats, or weight loss.  EYES: No visual changes or eye pain ENT: No hearing changes.  No history of nose bleeds.   RESPIRATORY: No cough, wheezing and shortness of breath.   CARDIOVASCULAR: Negative for chest pain, and palpitations.   GI: Negative for abdominal discomfort, blood in stools or black stools.  No recent change in bowel habits.   GU:  No history of  incontinence.   MUSCLOSKELETAL: No history of joint pain or swelling.  No myalgias.   SKIN: + lesions, rash, and itching.   ENDOCRINE: Negative for cold or heat intolerance, polydipsia or goiter.   PSYCH:  No depression or anxiety symptoms.   NEURO: As Above.   Vital Signs:  BP   Pulse 102  Temp(Src) 97.7 F (36.5 C)  Ht 6\' 2"  (1.88 m)  Wt 164 lb 5 oz (74.532 kg)  BMI 21.09 kg/m2  SpO2 99%  Neurological Exam: MENTAL STATUS including orientation to time, place, person, recent and remote memory, attention span and concentration, language, and fund of knowledge is normal.  Speech is not dysarthric.  CRANIAL NERVES:    Face is symmetric.  MOTOR:  Moderate left ABP atrophy.  Motor strength is 5/5 in all extremities, except left ABP 4+/5.   Tone is normal.  Extensive AVF along the lateral left forearm with prominent vasculature distally and proximally.   MSRs:  Reflexes are 2+/4 throughout, except 1+ at the Achilles.  SENSORY:  Reduced temperature and hyperesthesia to pin prick over the left median distribution.  COORDINATION/GAIT:    Gait narrow based and stable.   Data: EMG 08/16/2014: 1. 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 These findings are severe in degree electrically and worse on the left. 2. A superimposed median neuropathy affecting the left upper extremity is likely, given the preferential loss of abductor pollicis brevis motor amplitude, however this cannot be localized based on nerve conduction studies alone. Clinical correlation recommended.  NM median nerve ultrasound 10/04/2014: No mass lesion associated with median nerve root displacement of the nerve by a mass or patient's known AV fistula. Flattening of the median nerve with decreasing cross-section measurement of the nerve just deep to the flexor retinaculum.  IMPRESSION: Brian Liu  is a 64 year-old gentleman with ESRD on HD via left forearm fistula returning for evaluation of left hand paresthesias. Exam is notable for left ABP weakness and sensory deficits following the median nerve distribution. Nerve conduction studies and ultrasound of the median nerve also suggest carpal tunnel syndrome affecting the left wrist.  At his last visit, we discussed management options including referral to occupational therapy and/or orthopeadic surgery, but patient denies any weakness and is not interested in surgery. I also do not think he would be a good surgical candidate because of risks of bleeding due to location of his AV fistula.  Nerve ultrasound did not show vascular infiltration of the nerve by the AVF, but I do feel that there may be some component of his AVF causing localized pressure to the carpal tunnel.    Today, his burning pain has now transitioned into numbness which is more bothersome because he cannot tell when he is feeling objects and prefers the pain over the numbness.  Therefore, I will discontinue his lidocaine ointment.  I agree that wrist splint is challenging to wear in this patient and may cause greater problems to his AVF so will simply need to manage his pain with medications.    PLAN/RECOMMENDATIONS:  1.  Continue gabapentin 300mg  three times daily. OK to take extra tablet after each dialysis 2.  Stop lidocaine ointment  3.  Discontinue wrist splint 5.  Return to clinic as needed  The duration of this appointment visit was 25 minutes of face-to-face time with the patient.  Greater than 50% of this time was spent in counseling, explanation of diagnosis, planning of further management, and coordination of care.   Thank you for allowing me to participate in patient's care.  If I can answer any additional questions, I would be pleased to do so.    Sincerely,    Brian Flamenco K. Posey Pronto, DO

## 2015-07-26 ENCOUNTER — Other Ambulatory Visit: Payer: Self-pay | Admitting: Cardiology

## 2015-07-27 ENCOUNTER — Telehealth: Payer: Self-pay | Admitting: *Deleted

## 2015-07-27 NOTE — Telephone Encounter (Signed)
[facility] calling to verify labetalol 100mg  bid and prescriber since Dr Ron Parker has retired in September and refill was sent in 07/26/15 with Dr. Ron Parker on refill. Gave Dr. Court Joy info as pt will f/u in January with him.

## 2015-08-07 DIAGNOSIS — Z992 Dependence on renal dialysis: Secondary | ICD-10-CM | POA: Diagnosis not present

## 2015-08-07 DIAGNOSIS — N186 End stage renal disease: Secondary | ICD-10-CM | POA: Diagnosis not present

## 2015-08-07 DIAGNOSIS — N2581 Secondary hyperparathyroidism of renal origin: Secondary | ICD-10-CM | POA: Diagnosis not present

## 2015-08-07 DIAGNOSIS — D472 Monoclonal gammopathy: Secondary | ICD-10-CM | POA: Diagnosis not present

## 2015-08-07 DIAGNOSIS — D509 Iron deficiency anemia, unspecified: Secondary | ICD-10-CM | POA: Diagnosis not present

## 2015-08-08 DIAGNOSIS — Z01812 Encounter for preprocedural laboratory examination: Secondary | ICD-10-CM | POA: Diagnosis not present

## 2015-08-09 DIAGNOSIS — H401133 Primary open-angle glaucoma, bilateral, severe stage: Secondary | ICD-10-CM | POA: Diagnosis not present

## 2015-08-09 DIAGNOSIS — D509 Iron deficiency anemia, unspecified: Secondary | ICD-10-CM | POA: Diagnosis not present

## 2015-08-09 DIAGNOSIS — N186 End stage renal disease: Secondary | ICD-10-CM | POA: Diagnosis not present

## 2015-08-09 DIAGNOSIS — H16223 Keratoconjunctivitis sicca, not specified as Sjogren's, bilateral: Secondary | ICD-10-CM | POA: Diagnosis not present

## 2015-08-09 DIAGNOSIS — H2513 Age-related nuclear cataract, bilateral: Secondary | ICD-10-CM | POA: Diagnosis not present

## 2015-08-09 DIAGNOSIS — Z992 Dependence on renal dialysis: Secondary | ICD-10-CM | POA: Diagnosis not present

## 2015-08-09 DIAGNOSIS — D472 Monoclonal gammopathy: Secondary | ICD-10-CM | POA: Diagnosis not present

## 2015-08-09 DIAGNOSIS — N2581 Secondary hyperparathyroidism of renal origin: Secondary | ICD-10-CM | POA: Diagnosis not present

## 2015-08-11 DIAGNOSIS — Z992 Dependence on renal dialysis: Secondary | ICD-10-CM | POA: Diagnosis not present

## 2015-08-11 DIAGNOSIS — N186 End stage renal disease: Secondary | ICD-10-CM | POA: Diagnosis not present

## 2015-08-11 DIAGNOSIS — D509 Iron deficiency anemia, unspecified: Secondary | ICD-10-CM | POA: Diagnosis not present

## 2015-08-11 DIAGNOSIS — D472 Monoclonal gammopathy: Secondary | ICD-10-CM | POA: Diagnosis not present

## 2015-08-11 DIAGNOSIS — N2581 Secondary hyperparathyroidism of renal origin: Secondary | ICD-10-CM | POA: Diagnosis not present

## 2015-08-14 DIAGNOSIS — Z992 Dependence on renal dialysis: Secondary | ICD-10-CM | POA: Diagnosis not present

## 2015-08-14 DIAGNOSIS — N2581 Secondary hyperparathyroidism of renal origin: Secondary | ICD-10-CM | POA: Diagnosis not present

## 2015-08-14 DIAGNOSIS — D472 Monoclonal gammopathy: Secondary | ICD-10-CM | POA: Diagnosis not present

## 2015-08-14 DIAGNOSIS — N186 End stage renal disease: Secondary | ICD-10-CM | POA: Diagnosis not present

## 2015-08-14 DIAGNOSIS — D509 Iron deficiency anemia, unspecified: Secondary | ICD-10-CM | POA: Diagnosis not present

## 2015-08-16 DIAGNOSIS — N2581 Secondary hyperparathyroidism of renal origin: Secondary | ICD-10-CM | POA: Diagnosis not present

## 2015-08-16 DIAGNOSIS — D509 Iron deficiency anemia, unspecified: Secondary | ICD-10-CM | POA: Diagnosis not present

## 2015-08-16 DIAGNOSIS — D472 Monoclonal gammopathy: Secondary | ICD-10-CM | POA: Diagnosis not present

## 2015-08-16 DIAGNOSIS — Z992 Dependence on renal dialysis: Secondary | ICD-10-CM | POA: Diagnosis not present

## 2015-08-16 DIAGNOSIS — N186 End stage renal disease: Secondary | ICD-10-CM | POA: Diagnosis not present

## 2015-08-18 DIAGNOSIS — D472 Monoclonal gammopathy: Secondary | ICD-10-CM | POA: Diagnosis not present

## 2015-08-18 DIAGNOSIS — N2581 Secondary hyperparathyroidism of renal origin: Secondary | ICD-10-CM | POA: Diagnosis not present

## 2015-08-18 DIAGNOSIS — N186 End stage renal disease: Secondary | ICD-10-CM | POA: Diagnosis not present

## 2015-08-18 DIAGNOSIS — Z992 Dependence on renal dialysis: Secondary | ICD-10-CM | POA: Diagnosis not present

## 2015-08-18 DIAGNOSIS — D509 Iron deficiency anemia, unspecified: Secondary | ICD-10-CM | POA: Diagnosis not present

## 2015-08-21 DIAGNOSIS — Z992 Dependence on renal dialysis: Secondary | ICD-10-CM | POA: Diagnosis not present

## 2015-08-21 DIAGNOSIS — I700 Atherosclerosis of aorta: Secondary | ICD-10-CM | POA: Diagnosis not present

## 2015-08-21 DIAGNOSIS — N186 End stage renal disease: Secondary | ICD-10-CM | POA: Diagnosis not present

## 2015-08-21 DIAGNOSIS — Z01818 Encounter for other preprocedural examination: Secondary | ICD-10-CM | POA: Diagnosis not present

## 2015-08-21 DIAGNOSIS — D509 Iron deficiency anemia, unspecified: Secondary | ICD-10-CM | POA: Diagnosis not present

## 2015-08-21 DIAGNOSIS — M19011 Primary osteoarthritis, right shoulder: Secondary | ICD-10-CM | POA: Diagnosis not present

## 2015-08-21 DIAGNOSIS — D472 Monoclonal gammopathy: Secondary | ICD-10-CM | POA: Diagnosis not present

## 2015-08-21 DIAGNOSIS — N2581 Secondary hyperparathyroidism of renal origin: Secondary | ICD-10-CM | POA: Diagnosis not present

## 2015-08-22 ENCOUNTER — Encounter: Payer: Self-pay | Admitting: Cardiovascular Disease

## 2015-08-22 ENCOUNTER — Encounter: Payer: Self-pay | Admitting: *Deleted

## 2015-08-22 ENCOUNTER — Ambulatory Visit (INDEPENDENT_AMBULATORY_CARE_PROVIDER_SITE_OTHER): Payer: Medicare Other | Admitting: Cardiovascular Disease

## 2015-08-22 VITALS — BP 131/93 | HR 80 | Ht 74.0 in | Wt 156.0 lb

## 2015-08-22 DIAGNOSIS — I482 Chronic atrial fibrillation, unspecified: Secondary | ICD-10-CM

## 2015-08-22 DIAGNOSIS — N186 End stage renal disease: Secondary | ICD-10-CM

## 2015-08-22 DIAGNOSIS — I272 Other secondary pulmonary hypertension: Secondary | ICD-10-CM

## 2015-08-22 DIAGNOSIS — I5189 Other ill-defined heart diseases: Secondary | ICD-10-CM

## 2015-08-22 DIAGNOSIS — I10 Essential (primary) hypertension: Secondary | ICD-10-CM

## 2015-08-22 DIAGNOSIS — Z538 Procedure and treatment not carried out for other reasons: Secondary | ICD-10-CM | POA: Diagnosis not present

## 2015-08-22 DIAGNOSIS — M19012 Primary osteoarthritis, left shoulder: Secondary | ICD-10-CM | POA: Diagnosis not present

## 2015-08-22 DIAGNOSIS — I519 Heart disease, unspecified: Secondary | ICD-10-CM | POA: Diagnosis not present

## 2015-08-22 NOTE — Progress Notes (Signed)
Patient ID: Brian Liu, male   DOB: [birth year], 65 y.o.   MRN: [id number]      SUBJECTIVE: The patient is a 65 year old male who used to see Dr. Ron Parker. He reportedly has a history of hypertensive heart disease, chronic atrial fibrillation, and severe pulmonary hypertension secondary to severe diastolic dysfunction. He also has end-stage renal disease and is on dialysis.  Echocardiogram in February 2013 demonstrated normal left ventricular systolic function, LVEF 123456, moderate concentric LVH, restrictive diastolic physiology, severe pulmonary hypertension, mild mitral and mild to moderate tricuspid regurgitation.  He denies exertional chest pain and occasionally has exertional dyspnea. Denies leg swelling. Has been undergoing hemodialysis for the past 21 years.   Review of Systems: As per "subjective", otherwise negative.  No Known Allergies  Current Outpatient Prescriptions  Medication Sig Dispense Refill  . AZOPT 1 % ophthalmic suspension   4  . bimatoprost (LUMIGAN) 0.03 % ophthalmic solution 1 drop.    . cinacalcet (SENSIPAR) 30 MG tablet Take 30 mg by mouth.    . folic acid (FOLVITE) 1 MG tablet Take 1 mg by mouth daily.    [name] gabapentin (NEURONTIN) 300 MG capsule Take 1 capsule (300 mg total) by mouth 3 (three) times daily. 300 capsule 3  . labetalol (NORMODYNE) 100 MG tablet TAKE 1 BY MOUTH TWO TIMES  DAILY 180 tablet 0  . lanthanum (FOSRENOL) 500 MG chewable tablet Chew 1,000 mg by mouth 3 (three) times daily with meals. And 1 with snacks    . latanoprost (XALATAN) 0.005 % ophthalmic solution Place 1 drop into both eyes at bedtime.  6  . levothyroxine (SYNTHROID, LEVOTHROID) 137 MCG tablet Take 137 mcg by mouth daily before breakfast.    . Multiple Vitamin (DAILY VITE) TABS Take 1 tablet by mouth daily.     [name] SIMBRINZA 1-0.2 % SUSP INSTILL 1 DROP IN INTO BOTH EYES TWICE A DAY  6  . simvastatin (ZOCOR) 20 MG tablet Take 20 mg by mouth daily with supper.      [name] UNABLE TO FIND  Take 1 tablet by mouth. Dailyvite 800 mg     No current facility-administered medications for this visit.    Past Medical History  Diagnosis Date  . Hypertension   . Pulmonary hypertension (HCC)     Severe, PA pressure 65-70 mm of mercury, echo, 2013  . Mitral regurgitation     Mild by echo, 2013  . Tobacco abuse   . Atrial fibrillation (HCC)     Permanent, Not on Coumadin because of history of lower GI bleed  . ESRD (end stage renal disease) ([place])     Dialysis, fistula in left arm  . Dyslipidemia   . Lower gastrointestinal bleed   . LVH (left ventricular hypertrophy)   . Pancytopenia ([place])     Dr Ignacia Bayley  . Ejection fraction     EF 60-65%, echo, 2013  . Diastolic dysfunction     Restrictive  diastolic filling pattern echo,  February, 2013    Past Surgical History  Procedure Laterality Date  . Av fistula placement Left 1992    Left arm   . 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 Marital Status: Widowed    Spouse Name: N/A  . Number of Children: N/A  . Years of Education: N/A   Occupational History  . Not on file.   Social History Main  Topics  . Smoking status: Never Smoker   . Smokeless tobacco: Never Used  . Alcohol Use: No  . Drug Use: No  . Sexual Activity: Not on file   Other Topics Concern  . Not on file   Social History Narrative   Lives alone in a one story home.  Has 2 sons.     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     Education high school.      Filed Vitals:   08/22/15 1112  BP: 131/93  Pulse: 80  Height: 6\' 2"  (1.88 m)  Weight: 156 lb (70.761 kg)    PHYSICAL EXAM General: NAD HEENT: Normal. Neck: No JVD, no thyromegaly. Lungs: Clear to auscultation bilaterally with normal respiratory effort. CV: Nondisplaced PMI.  Regular rate and rhythm, normal S1/S2, no [id number], 3/6 holosystolic murmur heard along left  sternal border. No pretibial or periankle edema.  No carotid bruit. Left arm AV fistula.  Abdomen: Soft, nontender, no distention.  Neurologic: Alert and oriented x 3.  Psych: Normal affect. Skin: Normal. Musculoskeletal: No gross deformities. Extremities: No clubbing or cyanosis.   ECG: Most recent ECG reviewed.      ASSESSMENT AND PLAN: 1. Chronic atrial fibrillation: Unable to be anticoagulated due to h/o GI bleeding. HR controlled on labetalol, appears to be in a regular rhythm.  2. Severe diastolic dysfunction/pulmonary hypertension: BP reasonable. Volume managed with dialysis.  3. ESRD on dialysis.  4. Essential HTN: BP reasonable on labetalol. No changes.  Dispo: f/u 1 year.   Kate Sable, M.D., F.A.C.C.

## 2015-08-22 NOTE — Patient Instructions (Signed)
Your physician wants you to follow-up in: 1 YEAR WITH DR KONESWARAN You will receive a reminder letter in the mail two months in advance. If you don't receive a letter, please call our office to schedule the follow-up appointment.  Your physician recommends that you continue on your current medications as directed. Please refer to the Current Medication list given to you today.  Thank you for choosing [facility]!!    

## 2015-08-23 DIAGNOSIS — D509 Iron deficiency anemia, unspecified: Secondary | ICD-10-CM | POA: Diagnosis not present

## 2015-08-23 DIAGNOSIS — N2581 Secondary hyperparathyroidism of renal origin: Secondary | ICD-10-CM | POA: Diagnosis not present

## 2015-08-23 DIAGNOSIS — N186 End stage renal disease: Secondary | ICD-10-CM | POA: Diagnosis not present

## 2015-08-23 DIAGNOSIS — Z992 Dependence on renal dialysis: Secondary | ICD-10-CM | POA: Diagnosis not present

## 2015-08-23 DIAGNOSIS — D472 Monoclonal gammopathy: Secondary | ICD-10-CM | POA: Diagnosis not present

## 2015-08-25 DIAGNOSIS — N186 End stage renal disease: Secondary | ICD-10-CM | POA: Diagnosis not present

## 2015-08-25 DIAGNOSIS — D509 Iron deficiency anemia, unspecified: Secondary | ICD-10-CM | POA: Diagnosis not present

## 2015-08-25 DIAGNOSIS — Z992 Dependence on renal dialysis: Secondary | ICD-10-CM | POA: Diagnosis not present

## 2015-08-25 DIAGNOSIS — N2581 Secondary hyperparathyroidism of renal origin: Secondary | ICD-10-CM | POA: Diagnosis not present

## 2015-08-25 DIAGNOSIS — D472 Monoclonal gammopathy: Secondary | ICD-10-CM | POA: Diagnosis not present

## 2015-08-28 DIAGNOSIS — D472 Monoclonal gammopathy: Secondary | ICD-10-CM | POA: Diagnosis not present

## 2015-08-28 DIAGNOSIS — N2581 Secondary hyperparathyroidism of renal origin: Secondary | ICD-10-CM | POA: Diagnosis not present

## 2015-08-28 DIAGNOSIS — N186 End stage renal disease: Secondary | ICD-10-CM | POA: Diagnosis not present

## 2015-08-28 DIAGNOSIS — D509 Iron deficiency anemia, unspecified: Secondary | ICD-10-CM | POA: Diagnosis not present

## 2015-08-28 DIAGNOSIS — Z992 Dependence on renal dialysis: Secondary | ICD-10-CM | POA: Diagnosis not present

## 2015-08-30 DIAGNOSIS — N186 End stage renal disease: Secondary | ICD-10-CM | POA: Diagnosis not present

## 2015-08-30 DIAGNOSIS — D472 Monoclonal gammopathy: Secondary | ICD-10-CM | POA: Diagnosis not present

## 2015-08-30 DIAGNOSIS — Z992 Dependence on renal dialysis: Secondary | ICD-10-CM | POA: Diagnosis not present

## 2015-08-30 DIAGNOSIS — D509 Iron deficiency anemia, unspecified: Secondary | ICD-10-CM | POA: Diagnosis not present

## 2015-08-30 DIAGNOSIS — N2581 Secondary hyperparathyroidism of renal origin: Secondary | ICD-10-CM | POA: Diagnosis not present

## 2015-09-01 DIAGNOSIS — N186 End stage renal disease: Secondary | ICD-10-CM | POA: Diagnosis not present

## 2015-09-01 DIAGNOSIS — D472 Monoclonal gammopathy: Secondary | ICD-10-CM | POA: Diagnosis not present

## 2015-09-01 DIAGNOSIS — Z992 Dependence on renal dialysis: Secondary | ICD-10-CM | POA: Diagnosis not present

## 2015-09-01 DIAGNOSIS — N2581 Secondary hyperparathyroidism of renal origin: Secondary | ICD-10-CM | POA: Diagnosis not present

## 2015-09-01 DIAGNOSIS — D509 Iron deficiency anemia, unspecified: Secondary | ICD-10-CM | POA: Diagnosis not present

## 2015-09-04 DIAGNOSIS — D472 Monoclonal gammopathy: Secondary | ICD-10-CM | POA: Diagnosis not present

## 2015-09-04 DIAGNOSIS — D509 Iron deficiency anemia, unspecified: Secondary | ICD-10-CM | POA: Diagnosis not present

## 2015-09-04 DIAGNOSIS — Z992 Dependence on renal dialysis: Secondary | ICD-10-CM | POA: Diagnosis not present

## 2015-09-04 DIAGNOSIS — N186 End stage renal disease: Secondary | ICD-10-CM | POA: Diagnosis not present

## 2015-09-04 DIAGNOSIS — N2581 Secondary hyperparathyroidism of renal origin: Secondary | ICD-10-CM | POA: Diagnosis not present

## 2015-09-05 DIAGNOSIS — Z992 Dependence on renal dialysis: Secondary | ICD-10-CM | POA: Diagnosis not present

## 2015-09-05 DIAGNOSIS — N186 End stage renal disease: Secondary | ICD-10-CM | POA: Diagnosis not present

## 2015-09-06 DIAGNOSIS — Z992 Dependence on renal dialysis: Secondary | ICD-10-CM | POA: Diagnosis not present

## 2015-09-06 DIAGNOSIS — N2581 Secondary hyperparathyroidism of renal origin: Secondary | ICD-10-CM | POA: Diagnosis not present

## 2015-09-06 DIAGNOSIS — N186 End stage renal disease: Secondary | ICD-10-CM | POA: Diagnosis not present

## 2015-09-06 DIAGNOSIS — D472 Monoclonal gammopathy: Secondary | ICD-10-CM | POA: Diagnosis not present

## 2015-09-06 DIAGNOSIS — D509 Iron deficiency anemia, unspecified: Secondary | ICD-10-CM | POA: Diagnosis not present

## 2015-09-07 DIAGNOSIS — M25539 Pain in unspecified wrist: Secondary | ICD-10-CM | POA: Diagnosis not present

## 2015-09-07 DIAGNOSIS — M19012 Primary osteoarthritis, left shoulder: Secondary | ICD-10-CM | POA: Diagnosis not present

## 2015-09-08 DIAGNOSIS — D509 Iron deficiency anemia, unspecified: Secondary | ICD-10-CM | POA: Diagnosis not present

## 2015-09-08 DIAGNOSIS — D472 Monoclonal gammopathy: Secondary | ICD-10-CM | POA: Diagnosis not present

## 2015-09-08 DIAGNOSIS — N186 End stage renal disease: Secondary | ICD-10-CM | POA: Diagnosis not present

## 2015-09-08 DIAGNOSIS — N2581 Secondary hyperparathyroidism of renal origin: Secondary | ICD-10-CM | POA: Diagnosis not present

## 2015-09-08 DIAGNOSIS — Z992 Dependence on renal dialysis: Secondary | ICD-10-CM | POA: Diagnosis not present

## 2015-09-11 DIAGNOSIS — N2581 Secondary hyperparathyroidism of renal origin: Secondary | ICD-10-CM | POA: Diagnosis not present

## 2015-09-11 DIAGNOSIS — Z992 Dependence on renal dialysis: Secondary | ICD-10-CM | POA: Diagnosis not present

## 2015-09-11 DIAGNOSIS — N186 End stage renal disease: Secondary | ICD-10-CM | POA: Diagnosis not present

## 2015-09-11 DIAGNOSIS — H25812 Combined forms of age-related cataract, left eye: Secondary | ICD-10-CM | POA: Diagnosis not present

## 2015-09-11 DIAGNOSIS — H269 Unspecified cataract: Secondary | ICD-10-CM | POA: Diagnosis not present

## 2015-09-11 DIAGNOSIS — H401122 Primary open-angle glaucoma, left eye, moderate stage: Secondary | ICD-10-CM | POA: Diagnosis not present

## 2015-09-11 DIAGNOSIS — H25813 Combined forms of age-related cataract, bilateral: Secondary | ICD-10-CM | POA: Diagnosis not present

## 2015-09-11 DIAGNOSIS — D509 Iron deficiency anemia, unspecified: Secondary | ICD-10-CM | POA: Diagnosis not present

## 2015-09-11 DIAGNOSIS — D472 Monoclonal gammopathy: Secondary | ICD-10-CM | POA: Diagnosis not present

## 2015-09-13 DIAGNOSIS — N2581 Secondary hyperparathyroidism of renal origin: Secondary | ICD-10-CM | POA: Diagnosis not present

## 2015-09-13 DIAGNOSIS — N186 End stage renal disease: Secondary | ICD-10-CM | POA: Diagnosis not present

## 2015-09-13 DIAGNOSIS — Z992 Dependence on renal dialysis: Secondary | ICD-10-CM | POA: Diagnosis not present

## 2015-09-13 DIAGNOSIS — D472 Monoclonal gammopathy: Secondary | ICD-10-CM | POA: Diagnosis not present

## 2015-09-13 DIAGNOSIS — D509 Iron deficiency anemia, unspecified: Secondary | ICD-10-CM | POA: Diagnosis not present

## 2015-09-15 DIAGNOSIS — Z992 Dependence on renal dialysis: Secondary | ICD-10-CM | POA: Diagnosis not present

## 2015-09-15 DIAGNOSIS — N186 End stage renal disease: Secondary | ICD-10-CM | POA: Diagnosis not present

## 2015-09-15 DIAGNOSIS — D509 Iron deficiency anemia, unspecified: Secondary | ICD-10-CM | POA: Diagnosis not present

## 2015-09-15 DIAGNOSIS — D472 Monoclonal gammopathy: Secondary | ICD-10-CM | POA: Diagnosis not present

## 2015-09-15 DIAGNOSIS — N2581 Secondary hyperparathyroidism of renal origin: Secondary | ICD-10-CM | POA: Diagnosis not present

## 2015-09-18 DIAGNOSIS — N2581 Secondary hyperparathyroidism of renal origin: Secondary | ICD-10-CM | POA: Diagnosis not present

## 2015-09-18 DIAGNOSIS — N186 End stage renal disease: Secondary | ICD-10-CM | POA: Diagnosis not present

## 2015-09-18 DIAGNOSIS — D472 Monoclonal gammopathy: Secondary | ICD-10-CM | POA: Diagnosis not present

## 2015-09-18 DIAGNOSIS — Z992 Dependence on renal dialysis: Secondary | ICD-10-CM | POA: Diagnosis not present

## 2015-09-18 DIAGNOSIS — D509 Iron deficiency anemia, unspecified: Secondary | ICD-10-CM | POA: Diagnosis not present

## 2015-09-20 DIAGNOSIS — N186 End stage renal disease: Secondary | ICD-10-CM | POA: Diagnosis not present

## 2015-09-20 DIAGNOSIS — D472 Monoclonal gammopathy: Secondary | ICD-10-CM | POA: Diagnosis not present

## 2015-09-20 DIAGNOSIS — N2581 Secondary hyperparathyroidism of renal origin: Secondary | ICD-10-CM | POA: Diagnosis not present

## 2015-09-20 DIAGNOSIS — D509 Iron deficiency anemia, unspecified: Secondary | ICD-10-CM | POA: Diagnosis not present

## 2015-09-20 DIAGNOSIS — Z992 Dependence on renal dialysis: Secondary | ICD-10-CM | POA: Diagnosis not present

## 2015-09-22 DIAGNOSIS — D509 Iron deficiency anemia, unspecified: Secondary | ICD-10-CM | POA: Diagnosis not present

## 2015-09-22 DIAGNOSIS — Z992 Dependence on renal dialysis: Secondary | ICD-10-CM | POA: Diagnosis not present

## 2015-09-22 DIAGNOSIS — D472 Monoclonal gammopathy: Secondary | ICD-10-CM | POA: Diagnosis not present

## 2015-09-22 DIAGNOSIS — N186 End stage renal disease: Secondary | ICD-10-CM | POA: Diagnosis not present

## 2015-09-22 DIAGNOSIS — N2581 Secondary hyperparathyroidism of renal origin: Secondary | ICD-10-CM | POA: Diagnosis not present

## 2015-09-25 DIAGNOSIS — D472 Monoclonal gammopathy: Secondary | ICD-10-CM | POA: Diagnosis not present

## 2015-09-25 DIAGNOSIS — D509 Iron deficiency anemia, unspecified: Secondary | ICD-10-CM | POA: Diagnosis not present

## 2015-09-25 DIAGNOSIS — Z992 Dependence on renal dialysis: Secondary | ICD-10-CM | POA: Diagnosis not present

## 2015-09-25 DIAGNOSIS — N2581 Secondary hyperparathyroidism of renal origin: Secondary | ICD-10-CM | POA: Diagnosis not present

## 2015-09-25 DIAGNOSIS — N186 End stage renal disease: Secondary | ICD-10-CM | POA: Diagnosis not present

## 2015-09-27 DIAGNOSIS — D472 Monoclonal gammopathy: Secondary | ICD-10-CM | POA: Diagnosis not present

## 2015-09-27 DIAGNOSIS — D509 Iron deficiency anemia, unspecified: Secondary | ICD-10-CM | POA: Diagnosis not present

## 2015-09-27 DIAGNOSIS — N186 End stage renal disease: Secondary | ICD-10-CM | POA: Diagnosis not present

## 2015-09-27 DIAGNOSIS — Z992 Dependence on renal dialysis: Secondary | ICD-10-CM | POA: Diagnosis not present

## 2015-09-27 DIAGNOSIS — N2581 Secondary hyperparathyroidism of renal origin: Secondary | ICD-10-CM | POA: Diagnosis not present

## 2015-09-29 DIAGNOSIS — Z992 Dependence on renal dialysis: Secondary | ICD-10-CM | POA: Diagnosis not present

## 2015-09-29 DIAGNOSIS — D472 Monoclonal gammopathy: Secondary | ICD-10-CM | POA: Diagnosis not present

## 2015-09-29 DIAGNOSIS — N2581 Secondary hyperparathyroidism of renal origin: Secondary | ICD-10-CM | POA: Diagnosis not present

## 2015-09-29 DIAGNOSIS — N186 End stage renal disease: Secondary | ICD-10-CM | POA: Diagnosis not present

## 2015-09-29 DIAGNOSIS — D509 Iron deficiency anemia, unspecified: Secondary | ICD-10-CM | POA: Diagnosis not present

## 2015-10-02 DIAGNOSIS — D472 Monoclonal gammopathy: Secondary | ICD-10-CM | POA: Diagnosis not present

## 2015-10-02 DIAGNOSIS — N2581 Secondary hyperparathyroidism of renal origin: Secondary | ICD-10-CM | POA: Diagnosis not present

## 2015-10-02 DIAGNOSIS — N186 End stage renal disease: Secondary | ICD-10-CM | POA: Diagnosis not present

## 2015-10-02 DIAGNOSIS — Z992 Dependence on renal dialysis: Secondary | ICD-10-CM | POA: Diagnosis not present

## 2015-10-02 DIAGNOSIS — D509 Iron deficiency anemia, unspecified: Secondary | ICD-10-CM | POA: Diagnosis not present

## 2015-10-03 DIAGNOSIS — Z992 Dependence on renal dialysis: Secondary | ICD-10-CM | POA: Diagnosis not present

## 2015-10-03 DIAGNOSIS — N186 End stage renal disease: Secondary | ICD-10-CM | POA: Diagnosis not present

## 2015-10-04 DIAGNOSIS — Z992 Dependence on renal dialysis: Secondary | ICD-10-CM | POA: Diagnosis not present

## 2015-10-04 DIAGNOSIS — D472 Monoclonal gammopathy: Secondary | ICD-10-CM | POA: Diagnosis not present

## 2015-10-04 DIAGNOSIS — N2581 Secondary hyperparathyroidism of renal origin: Secondary | ICD-10-CM | POA: Diagnosis not present

## 2015-10-04 DIAGNOSIS — D509 Iron deficiency anemia, unspecified: Secondary | ICD-10-CM | POA: Diagnosis not present

## 2015-10-04 DIAGNOSIS — N186 End stage renal disease: Secondary | ICD-10-CM | POA: Diagnosis not present

## 2015-10-06 DIAGNOSIS — N2581 Secondary hyperparathyroidism of renal origin: Secondary | ICD-10-CM | POA: Diagnosis not present

## 2015-10-06 DIAGNOSIS — Z992 Dependence on renal dialysis: Secondary | ICD-10-CM | POA: Diagnosis not present

## 2015-10-06 DIAGNOSIS — D509 Iron deficiency anemia, unspecified: Secondary | ICD-10-CM | POA: Diagnosis not present

## 2015-10-06 DIAGNOSIS — D472 Monoclonal gammopathy: Secondary | ICD-10-CM | POA: Diagnosis not present

## 2015-10-06 DIAGNOSIS — N186 End stage renal disease: Secondary | ICD-10-CM | POA: Diagnosis not present

## 2015-10-09 DIAGNOSIS — D509 Iron deficiency anemia, unspecified: Secondary | ICD-10-CM | POA: Diagnosis not present

## 2015-10-09 DIAGNOSIS — N2581 Secondary hyperparathyroidism of renal origin: Secondary | ICD-10-CM | POA: Diagnosis not present

## 2015-10-09 DIAGNOSIS — Z992 Dependence on renal dialysis: Secondary | ICD-10-CM | POA: Diagnosis not present

## 2015-10-09 DIAGNOSIS — D472 Monoclonal gammopathy: Secondary | ICD-10-CM | POA: Diagnosis not present

## 2015-10-09 DIAGNOSIS — N186 End stage renal disease: Secondary | ICD-10-CM | POA: Diagnosis not present

## 2015-10-11 DIAGNOSIS — D509 Iron deficiency anemia, unspecified: Secondary | ICD-10-CM | POA: Diagnosis not present

## 2015-10-11 DIAGNOSIS — N186 End stage renal disease: Secondary | ICD-10-CM | POA: Diagnosis not present

## 2015-10-11 DIAGNOSIS — D472 Monoclonal gammopathy: Secondary | ICD-10-CM | POA: Diagnosis not present

## 2015-10-11 DIAGNOSIS — N2581 Secondary hyperparathyroidism of renal origin: Secondary | ICD-10-CM | POA: Diagnosis not present

## 2015-10-11 DIAGNOSIS — Z992 Dependence on renal dialysis: Secondary | ICD-10-CM | POA: Diagnosis not present

## 2015-10-12 DIAGNOSIS — I10 Essential (primary) hypertension: Secondary | ICD-10-CM | POA: Diagnosis not present

## 2015-10-12 DIAGNOSIS — E7800 Pure hypercholesterolemia, unspecified: Secondary | ICD-10-CM | POA: Diagnosis not present

## 2015-10-13 DIAGNOSIS — D509 Iron deficiency anemia, unspecified: Secondary | ICD-10-CM | POA: Diagnosis not present

## 2015-10-13 DIAGNOSIS — N186 End stage renal disease: Secondary | ICD-10-CM | POA: Diagnosis not present

## 2015-10-13 DIAGNOSIS — N2581 Secondary hyperparathyroidism of renal origin: Secondary | ICD-10-CM | POA: Diagnosis not present

## 2015-10-13 DIAGNOSIS — D472 Monoclonal gammopathy: Secondary | ICD-10-CM | POA: Diagnosis not present

## 2015-10-13 DIAGNOSIS — Z992 Dependence on renal dialysis: Secondary | ICD-10-CM | POA: Diagnosis not present

## 2015-10-16 DIAGNOSIS — N186 End stage renal disease: Secondary | ICD-10-CM | POA: Diagnosis not present

## 2015-10-16 DIAGNOSIS — Z992 Dependence on renal dialysis: Secondary | ICD-10-CM | POA: Diagnosis not present

## 2015-10-16 DIAGNOSIS — N2581 Secondary hyperparathyroidism of renal origin: Secondary | ICD-10-CM | POA: Diagnosis not present

## 2015-10-16 DIAGNOSIS — D509 Iron deficiency anemia, unspecified: Secondary | ICD-10-CM | POA: Diagnosis not present

## 2015-10-16 DIAGNOSIS — D472 Monoclonal gammopathy: Secondary | ICD-10-CM | POA: Diagnosis not present

## 2015-10-18 DIAGNOSIS — D472 Monoclonal gammopathy: Secondary | ICD-10-CM | POA: Diagnosis not present

## 2015-10-18 DIAGNOSIS — N2581 Secondary hyperparathyroidism of renal origin: Secondary | ICD-10-CM | POA: Diagnosis not present

## 2015-10-18 DIAGNOSIS — Z992 Dependence on renal dialysis: Secondary | ICD-10-CM | POA: Diagnosis not present

## 2015-10-18 DIAGNOSIS — D509 Iron deficiency anemia, unspecified: Secondary | ICD-10-CM | POA: Diagnosis not present

## 2015-10-18 DIAGNOSIS — N186 End stage renal disease: Secondary | ICD-10-CM | POA: Diagnosis not present

## 2015-10-20 DIAGNOSIS — D509 Iron deficiency anemia, unspecified: Secondary | ICD-10-CM | POA: Diagnosis not present

## 2015-10-20 DIAGNOSIS — Z992 Dependence on renal dialysis: Secondary | ICD-10-CM | POA: Diagnosis not present

## 2015-10-20 DIAGNOSIS — D472 Monoclonal gammopathy: Secondary | ICD-10-CM | POA: Diagnosis not present

## 2015-10-20 DIAGNOSIS — N2581 Secondary hyperparathyroidism of renal origin: Secondary | ICD-10-CM | POA: Diagnosis not present

## 2015-10-20 DIAGNOSIS — N186 End stage renal disease: Secondary | ICD-10-CM | POA: Diagnosis not present

## 2015-10-23 DIAGNOSIS — N2581 Secondary hyperparathyroidism of renal origin: Secondary | ICD-10-CM | POA: Diagnosis not present

## 2015-10-23 DIAGNOSIS — Z992 Dependence on renal dialysis: Secondary | ICD-10-CM | POA: Diagnosis not present

## 2015-10-23 DIAGNOSIS — N186 End stage renal disease: Secondary | ICD-10-CM | POA: Diagnosis not present

## 2015-10-23 DIAGNOSIS — D472 Monoclonal gammopathy: Secondary | ICD-10-CM | POA: Diagnosis not present

## 2015-10-23 DIAGNOSIS — D509 Iron deficiency anemia, unspecified: Secondary | ICD-10-CM | POA: Diagnosis not present

## 2015-10-25 DIAGNOSIS — D509 Iron deficiency anemia, unspecified: Secondary | ICD-10-CM | POA: Diagnosis not present

## 2015-10-25 DIAGNOSIS — D472 Monoclonal gammopathy: Secondary | ICD-10-CM | POA: Diagnosis not present

## 2015-10-25 DIAGNOSIS — N2581 Secondary hyperparathyroidism of renal origin: Secondary | ICD-10-CM | POA: Diagnosis not present

## 2015-10-25 DIAGNOSIS — Z992 Dependence on renal dialysis: Secondary | ICD-10-CM | POA: Diagnosis not present

## 2015-10-25 DIAGNOSIS — N186 End stage renal disease: Secondary | ICD-10-CM | POA: Diagnosis not present

## 2015-10-27 DIAGNOSIS — D509 Iron deficiency anemia, unspecified: Secondary | ICD-10-CM | POA: Diagnosis not present

## 2015-10-27 DIAGNOSIS — D472 Monoclonal gammopathy: Secondary | ICD-10-CM | POA: Diagnosis not present

## 2015-10-27 DIAGNOSIS — N2581 Secondary hyperparathyroidism of renal origin: Secondary | ICD-10-CM | POA: Diagnosis not present

## 2015-10-27 DIAGNOSIS — Z992 Dependence on renal dialysis: Secondary | ICD-10-CM | POA: Diagnosis not present

## 2015-10-27 DIAGNOSIS — N186 End stage renal disease: Secondary | ICD-10-CM | POA: Diagnosis not present

## 2015-10-30 DIAGNOSIS — N186 End stage renal disease: Secondary | ICD-10-CM | POA: Diagnosis not present

## 2015-10-30 DIAGNOSIS — D472 Monoclonal gammopathy: Secondary | ICD-10-CM | POA: Diagnosis not present

## 2015-10-30 DIAGNOSIS — Z992 Dependence on renal dialysis: Secondary | ICD-10-CM | POA: Diagnosis not present

## 2015-10-30 DIAGNOSIS — D509 Iron deficiency anemia, unspecified: Secondary | ICD-10-CM | POA: Diagnosis not present

## 2015-10-30 DIAGNOSIS — N2581 Secondary hyperparathyroidism of renal origin: Secondary | ICD-10-CM | POA: Diagnosis not present

## 2015-10-30 DIAGNOSIS — Z79899 Other long term (current) drug therapy: Secondary | ICD-10-CM | POA: Diagnosis not present

## 2015-10-31 DIAGNOSIS — I272 Other secondary pulmonary hypertension: Secondary | ICD-10-CM | POA: Diagnosis not present

## 2015-10-31 DIAGNOSIS — J019 Acute sinusitis, unspecified: Secondary | ICD-10-CM | POA: Diagnosis not present

## 2015-10-31 DIAGNOSIS — J449 Chronic obstructive pulmonary disease, unspecified: Secondary | ICD-10-CM | POA: Diagnosis not present

## 2015-10-31 DIAGNOSIS — N186 End stage renal disease: Secondary | ICD-10-CM | POA: Diagnosis not present

## 2015-10-31 DIAGNOSIS — Z789 Other specified health status: Secondary | ICD-10-CM | POA: Diagnosis not present

## 2015-10-31 DIAGNOSIS — Z992 Dependence on renal dialysis: Secondary | ICD-10-CM | POA: Diagnosis not present

## 2015-11-01 DIAGNOSIS — N2581 Secondary hyperparathyroidism of renal origin: Secondary | ICD-10-CM | POA: Diagnosis not present

## 2015-11-01 DIAGNOSIS — D509 Iron deficiency anemia, unspecified: Secondary | ICD-10-CM | POA: Diagnosis not present

## 2015-11-01 DIAGNOSIS — N186 End stage renal disease: Secondary | ICD-10-CM | POA: Diagnosis not present

## 2015-11-01 DIAGNOSIS — Z992 Dependence on renal dialysis: Secondary | ICD-10-CM | POA: Diagnosis not present

## 2015-11-01 DIAGNOSIS — D472 Monoclonal gammopathy: Secondary | ICD-10-CM | POA: Diagnosis not present

## 2015-11-02 DIAGNOSIS — H2511 Age-related nuclear cataract, right eye: Secondary | ICD-10-CM | POA: Diagnosis not present

## 2015-11-02 DIAGNOSIS — H401133 Primary open-angle glaucoma, bilateral, severe stage: Secondary | ICD-10-CM | POA: Diagnosis not present

## 2015-11-03 DIAGNOSIS — N186 End stage renal disease: Secondary | ICD-10-CM | POA: Diagnosis not present

## 2015-11-03 DIAGNOSIS — N2581 Secondary hyperparathyroidism of renal origin: Secondary | ICD-10-CM | POA: Diagnosis not present

## 2015-11-03 DIAGNOSIS — D472 Monoclonal gammopathy: Secondary | ICD-10-CM | POA: Diagnosis not present

## 2015-11-03 DIAGNOSIS — D509 Iron deficiency anemia, unspecified: Secondary | ICD-10-CM | POA: Diagnosis not present

## 2015-11-03 DIAGNOSIS — Z992 Dependence on renal dialysis: Secondary | ICD-10-CM | POA: Diagnosis not present

## 2015-11-06 DIAGNOSIS — N2581 Secondary hyperparathyroidism of renal origin: Secondary | ICD-10-CM | POA: Diagnosis not present

## 2015-11-06 DIAGNOSIS — D472 Monoclonal gammopathy: Secondary | ICD-10-CM | POA: Diagnosis not present

## 2015-11-06 DIAGNOSIS — D509 Iron deficiency anemia, unspecified: Secondary | ICD-10-CM | POA: Diagnosis not present

## 2015-11-06 DIAGNOSIS — Z992 Dependence on renal dialysis: Secondary | ICD-10-CM | POA: Diagnosis not present

## 2015-11-06 DIAGNOSIS — N186 End stage renal disease: Secondary | ICD-10-CM | POA: Diagnosis not present

## 2015-11-08 DIAGNOSIS — Z992 Dependence on renal dialysis: Secondary | ICD-10-CM | POA: Diagnosis not present

## 2015-11-08 DIAGNOSIS — D472 Monoclonal gammopathy: Secondary | ICD-10-CM | POA: Diagnosis not present

## 2015-11-08 DIAGNOSIS — N2581 Secondary hyperparathyroidism of renal origin: Secondary | ICD-10-CM | POA: Diagnosis not present

## 2015-11-08 DIAGNOSIS — N186 End stage renal disease: Secondary | ICD-10-CM | POA: Diagnosis not present

## 2015-11-08 DIAGNOSIS — D509 Iron deficiency anemia, unspecified: Secondary | ICD-10-CM | POA: Diagnosis not present

## 2015-11-10 DIAGNOSIS — D472 Monoclonal gammopathy: Secondary | ICD-10-CM | POA: Diagnosis not present

## 2015-11-10 DIAGNOSIS — Z992 Dependence on renal dialysis: Secondary | ICD-10-CM | POA: Diagnosis not present

## 2015-11-10 DIAGNOSIS — D509 Iron deficiency anemia, unspecified: Secondary | ICD-10-CM | POA: Diagnosis not present

## 2015-11-10 DIAGNOSIS — N2581 Secondary hyperparathyroidism of renal origin: Secondary | ICD-10-CM | POA: Diagnosis not present

## 2015-11-10 DIAGNOSIS — N186 End stage renal disease: Secondary | ICD-10-CM | POA: Diagnosis not present

## 2015-11-13 DIAGNOSIS — N2581 Secondary hyperparathyroidism of renal origin: Secondary | ICD-10-CM | POA: Diagnosis not present

## 2015-11-13 DIAGNOSIS — Z992 Dependence on renal dialysis: Secondary | ICD-10-CM | POA: Diagnosis not present

## 2015-11-13 DIAGNOSIS — D472 Monoclonal gammopathy: Secondary | ICD-10-CM | POA: Diagnosis not present

## 2015-11-13 DIAGNOSIS — N186 End stage renal disease: Secondary | ICD-10-CM | POA: Diagnosis not present

## 2015-11-13 DIAGNOSIS — D509 Iron deficiency anemia, unspecified: Secondary | ICD-10-CM | POA: Diagnosis not present

## 2015-11-15 DIAGNOSIS — D472 Monoclonal gammopathy: Secondary | ICD-10-CM | POA: Diagnosis not present

## 2015-11-15 DIAGNOSIS — N2581 Secondary hyperparathyroidism of renal origin: Secondary | ICD-10-CM | POA: Diagnosis not present

## 2015-11-15 DIAGNOSIS — N186 End stage renal disease: Secondary | ICD-10-CM | POA: Diagnosis not present

## 2015-11-15 DIAGNOSIS — D509 Iron deficiency anemia, unspecified: Secondary | ICD-10-CM | POA: Diagnosis not present

## 2015-11-15 DIAGNOSIS — Z992 Dependence on renal dialysis: Secondary | ICD-10-CM | POA: Diagnosis not present

## 2015-11-17 DIAGNOSIS — N186 End stage renal disease: Secondary | ICD-10-CM | POA: Diagnosis not present

## 2015-11-17 DIAGNOSIS — D472 Monoclonal gammopathy: Secondary | ICD-10-CM | POA: Diagnosis not present

## 2015-11-17 DIAGNOSIS — Z992 Dependence on renal dialysis: Secondary | ICD-10-CM | POA: Diagnosis not present

## 2015-11-17 DIAGNOSIS — N2581 Secondary hyperparathyroidism of renal origin: Secondary | ICD-10-CM | POA: Diagnosis not present

## 2015-11-17 DIAGNOSIS — D509 Iron deficiency anemia, unspecified: Secondary | ICD-10-CM | POA: Diagnosis not present

## 2015-11-20 DIAGNOSIS — N186 End stage renal disease: Secondary | ICD-10-CM | POA: Diagnosis not present

## 2015-11-20 DIAGNOSIS — Z992 Dependence on renal dialysis: Secondary | ICD-10-CM | POA: Diagnosis not present

## 2015-11-20 DIAGNOSIS — N2581 Secondary hyperparathyroidism of renal origin: Secondary | ICD-10-CM | POA: Diagnosis not present

## 2015-11-20 DIAGNOSIS — D509 Iron deficiency anemia, unspecified: Secondary | ICD-10-CM | POA: Diagnosis not present

## 2015-11-20 DIAGNOSIS — D472 Monoclonal gammopathy: Secondary | ICD-10-CM | POA: Diagnosis not present

## 2015-11-21 DIAGNOSIS — I10 Essential (primary) hypertension: Secondary | ICD-10-CM | POA: Diagnosis not present

## 2015-11-21 DIAGNOSIS — E7800 Pure hypercholesterolemia, unspecified: Secondary | ICD-10-CM | POA: Diagnosis not present

## 2015-11-22 DIAGNOSIS — N186 End stage renal disease: Secondary | ICD-10-CM | POA: Diagnosis not present

## 2015-11-22 DIAGNOSIS — D472 Monoclonal gammopathy: Secondary | ICD-10-CM | POA: Diagnosis not present

## 2015-11-22 DIAGNOSIS — N2581 Secondary hyperparathyroidism of renal origin: Secondary | ICD-10-CM | POA: Diagnosis not present

## 2015-11-22 DIAGNOSIS — Z992 Dependence on renal dialysis: Secondary | ICD-10-CM | POA: Diagnosis not present

## 2015-11-22 DIAGNOSIS — D509 Iron deficiency anemia, unspecified: Secondary | ICD-10-CM | POA: Diagnosis not present

## 2015-11-24 DIAGNOSIS — N2581 Secondary hyperparathyroidism of renal origin: Secondary | ICD-10-CM | POA: Diagnosis not present

## 2015-11-24 DIAGNOSIS — N186 End stage renal disease: Secondary | ICD-10-CM | POA: Diagnosis not present

## 2015-11-24 DIAGNOSIS — D472 Monoclonal gammopathy: Secondary | ICD-10-CM | POA: Diagnosis not present

## 2015-11-24 DIAGNOSIS — Z992 Dependence on renal dialysis: Secondary | ICD-10-CM | POA: Diagnosis not present

## 2015-11-24 DIAGNOSIS — D509 Iron deficiency anemia, unspecified: Secondary | ICD-10-CM | POA: Diagnosis not present

## 2015-11-27 DIAGNOSIS — D472 Monoclonal gammopathy: Secondary | ICD-10-CM | POA: Diagnosis not present

## 2015-11-27 DIAGNOSIS — N2581 Secondary hyperparathyroidism of renal origin: Secondary | ICD-10-CM | POA: Diagnosis not present

## 2015-11-27 DIAGNOSIS — N186 End stage renal disease: Secondary | ICD-10-CM | POA: Diagnosis not present

## 2015-11-27 DIAGNOSIS — D509 Iron deficiency anemia, unspecified: Secondary | ICD-10-CM | POA: Diagnosis not present

## 2015-11-27 DIAGNOSIS — Z992 Dependence on renal dialysis: Secondary | ICD-10-CM | POA: Diagnosis not present

## 2015-11-29 DIAGNOSIS — D472 Monoclonal gammopathy: Secondary | ICD-10-CM | POA: Diagnosis not present

## 2015-11-29 DIAGNOSIS — Z992 Dependence on renal dialysis: Secondary | ICD-10-CM | POA: Diagnosis not present

## 2015-11-29 DIAGNOSIS — N186 End stage renal disease: Secondary | ICD-10-CM | POA: Diagnosis not present

## 2015-11-29 DIAGNOSIS — N2581 Secondary hyperparathyroidism of renal origin: Secondary | ICD-10-CM | POA: Diagnosis not present

## 2015-11-29 DIAGNOSIS — D509 Iron deficiency anemia, unspecified: Secondary | ICD-10-CM | POA: Diagnosis not present

## 2015-12-01 DIAGNOSIS — Z992 Dependence on renal dialysis: Secondary | ICD-10-CM | POA: Diagnosis not present

## 2015-12-01 DIAGNOSIS — H16223 Keratoconjunctivitis sicca, not specified as Sjogren's, bilateral: Secondary | ICD-10-CM | POA: Diagnosis not present

## 2015-12-01 DIAGNOSIS — D509 Iron deficiency anemia, unspecified: Secondary | ICD-10-CM | POA: Diagnosis not present

## 2015-12-01 DIAGNOSIS — N2581 Secondary hyperparathyroidism of renal origin: Secondary | ICD-10-CM | POA: Diagnosis not present

## 2015-12-01 DIAGNOSIS — D472 Monoclonal gammopathy: Secondary | ICD-10-CM | POA: Diagnosis not present

## 2015-12-01 DIAGNOSIS — H401133 Primary open-angle glaucoma, bilateral, severe stage: Secondary | ICD-10-CM | POA: Diagnosis not present

## 2015-12-01 DIAGNOSIS — N186 End stage renal disease: Secondary | ICD-10-CM | POA: Diagnosis not present

## 2015-12-03 DIAGNOSIS — N186 End stage renal disease: Secondary | ICD-10-CM | POA: Diagnosis not present

## 2015-12-03 DIAGNOSIS — Z992 Dependence on renal dialysis: Secondary | ICD-10-CM | POA: Diagnosis not present

## 2015-12-04 DIAGNOSIS — D472 Monoclonal gammopathy: Secondary | ICD-10-CM | POA: Diagnosis not present

## 2015-12-04 DIAGNOSIS — Z992 Dependence on renal dialysis: Secondary | ICD-10-CM | POA: Diagnosis not present

## 2015-12-04 DIAGNOSIS — N186 End stage renal disease: Secondary | ICD-10-CM | POA: Diagnosis not present

## 2015-12-04 DIAGNOSIS — N2581 Secondary hyperparathyroidism of renal origin: Secondary | ICD-10-CM | POA: Diagnosis not present

## 2015-12-06 DIAGNOSIS — D472 Monoclonal gammopathy: Secondary | ICD-10-CM | POA: Diagnosis not present

## 2015-12-06 DIAGNOSIS — Z992 Dependence on renal dialysis: Secondary | ICD-10-CM | POA: Diagnosis not present

## 2015-12-06 DIAGNOSIS — N2581 Secondary hyperparathyroidism of renal origin: Secondary | ICD-10-CM | POA: Diagnosis not present

## 2015-12-06 DIAGNOSIS — N186 End stage renal disease: Secondary | ICD-10-CM | POA: Diagnosis not present

## 2015-12-08 DIAGNOSIS — N186 End stage renal disease: Secondary | ICD-10-CM | POA: Diagnosis not present

## 2015-12-08 DIAGNOSIS — D472 Monoclonal gammopathy: Secondary | ICD-10-CM | POA: Diagnosis not present

## 2015-12-08 DIAGNOSIS — Z992 Dependence on renal dialysis: Secondary | ICD-10-CM | POA: Diagnosis not present

## 2015-12-08 DIAGNOSIS — N2581 Secondary hyperparathyroidism of renal origin: Secondary | ICD-10-CM | POA: Diagnosis not present

## 2015-12-09 DIAGNOSIS — N186 End stage renal disease: Secondary | ICD-10-CM | POA: Diagnosis not present

## 2015-12-09 DIAGNOSIS — Z992 Dependence on renal dialysis: Secondary | ICD-10-CM | POA: Diagnosis not present

## 2015-12-09 DIAGNOSIS — D472 Monoclonal gammopathy: Secondary | ICD-10-CM | POA: Diagnosis not present

## 2015-12-09 DIAGNOSIS — N2581 Secondary hyperparathyroidism of renal origin: Secondary | ICD-10-CM | POA: Diagnosis not present

## 2015-12-11 DIAGNOSIS — N186 End stage renal disease: Secondary | ICD-10-CM | POA: Diagnosis not present

## 2015-12-11 DIAGNOSIS — Z992 Dependence on renal dialysis: Secondary | ICD-10-CM | POA: Diagnosis not present

## 2015-12-11 DIAGNOSIS — N2581 Secondary hyperparathyroidism of renal origin: Secondary | ICD-10-CM | POA: Diagnosis not present

## 2015-12-11 DIAGNOSIS — D472 Monoclonal gammopathy: Secondary | ICD-10-CM | POA: Diagnosis not present

## 2015-12-13 DIAGNOSIS — N2581 Secondary hyperparathyroidism of renal origin: Secondary | ICD-10-CM | POA: Diagnosis not present

## 2015-12-13 DIAGNOSIS — D472 Monoclonal gammopathy: Secondary | ICD-10-CM | POA: Diagnosis not present

## 2015-12-13 DIAGNOSIS — Z992 Dependence on renal dialysis: Secondary | ICD-10-CM | POA: Diagnosis not present

## 2015-12-13 DIAGNOSIS — N186 End stage renal disease: Secondary | ICD-10-CM | POA: Diagnosis not present

## 2015-12-14 DIAGNOSIS — I10 Essential (primary) hypertension: Secondary | ICD-10-CM | POA: Diagnosis not present

## 2015-12-14 DIAGNOSIS — E7800 Pure hypercholesterolemia, unspecified: Secondary | ICD-10-CM | POA: Diagnosis not present

## 2015-12-15 DIAGNOSIS — D472 Monoclonal gammopathy: Secondary | ICD-10-CM | POA: Diagnosis not present

## 2015-12-15 DIAGNOSIS — N2581 Secondary hyperparathyroidism of renal origin: Secondary | ICD-10-CM | POA: Diagnosis not present

## 2015-12-15 DIAGNOSIS — Z992 Dependence on renal dialysis: Secondary | ICD-10-CM | POA: Diagnosis not present

## 2015-12-15 DIAGNOSIS — N186 End stage renal disease: Secondary | ICD-10-CM | POA: Diagnosis not present

## 2015-12-18 DIAGNOSIS — N2581 Secondary hyperparathyroidism of renal origin: Secondary | ICD-10-CM | POA: Diagnosis not present

## 2015-12-18 DIAGNOSIS — N186 End stage renal disease: Secondary | ICD-10-CM | POA: Diagnosis not present

## 2015-12-18 DIAGNOSIS — D472 Monoclonal gammopathy: Secondary | ICD-10-CM | POA: Diagnosis not present

## 2015-12-18 DIAGNOSIS — Z992 Dependence on renal dialysis: Secondary | ICD-10-CM | POA: Diagnosis not present

## 2015-12-20 DIAGNOSIS — Z992 Dependence on renal dialysis: Secondary | ICD-10-CM | POA: Diagnosis not present

## 2015-12-20 DIAGNOSIS — N186 End stage renal disease: Secondary | ICD-10-CM | POA: Diagnosis not present

## 2015-12-20 DIAGNOSIS — N2581 Secondary hyperparathyroidism of renal origin: Secondary | ICD-10-CM | POA: Diagnosis not present

## 2015-12-20 DIAGNOSIS — D472 Monoclonal gammopathy: Secondary | ICD-10-CM | POA: Diagnosis not present

## 2015-12-22 DIAGNOSIS — N2581 Secondary hyperparathyroidism of renal origin: Secondary | ICD-10-CM | POA: Diagnosis not present

## 2015-12-22 DIAGNOSIS — D472 Monoclonal gammopathy: Secondary | ICD-10-CM | POA: Diagnosis not present

## 2015-12-22 DIAGNOSIS — N186 End stage renal disease: Secondary | ICD-10-CM | POA: Diagnosis not present

## 2015-12-22 DIAGNOSIS — Z992 Dependence on renal dialysis: Secondary | ICD-10-CM | POA: Diagnosis not present

## 2015-12-25 DIAGNOSIS — N186 End stage renal disease: Secondary | ICD-10-CM | POA: Diagnosis not present

## 2015-12-25 DIAGNOSIS — N2581 Secondary hyperparathyroidism of renal origin: Secondary | ICD-10-CM | POA: Diagnosis not present

## 2015-12-25 DIAGNOSIS — Z992 Dependence on renal dialysis: Secondary | ICD-10-CM | POA: Diagnosis not present

## 2015-12-25 DIAGNOSIS — D472 Monoclonal gammopathy: Secondary | ICD-10-CM | POA: Diagnosis not present

## 2015-12-27 DIAGNOSIS — Z992 Dependence on renal dialysis: Secondary | ICD-10-CM | POA: Diagnosis not present

## 2015-12-27 DIAGNOSIS — N186 End stage renal disease: Secondary | ICD-10-CM | POA: Diagnosis not present

## 2015-12-27 DIAGNOSIS — N2581 Secondary hyperparathyroidism of renal origin: Secondary | ICD-10-CM | POA: Diagnosis not present

## 2015-12-27 DIAGNOSIS — D472 Monoclonal gammopathy: Secondary | ICD-10-CM | POA: Diagnosis not present

## 2015-12-29 DIAGNOSIS — D472 Monoclonal gammopathy: Secondary | ICD-10-CM | POA: Diagnosis not present

## 2015-12-29 DIAGNOSIS — N186 End stage renal disease: Secondary | ICD-10-CM | POA: Diagnosis not present

## 2015-12-29 DIAGNOSIS — N2581 Secondary hyperparathyroidism of renal origin: Secondary | ICD-10-CM | POA: Diagnosis not present

## 2015-12-29 DIAGNOSIS — Z992 Dependence on renal dialysis: Secondary | ICD-10-CM | POA: Diagnosis not present

## 2016-01-01 DIAGNOSIS — Z992 Dependence on renal dialysis: Secondary | ICD-10-CM | POA: Diagnosis not present

## 2016-01-01 DIAGNOSIS — D472 Monoclonal gammopathy: Secondary | ICD-10-CM | POA: Diagnosis not present

## 2016-01-01 DIAGNOSIS — N186 End stage renal disease: Secondary | ICD-10-CM | POA: Diagnosis not present

## 2016-01-01 DIAGNOSIS — N2581 Secondary hyperparathyroidism of renal origin: Secondary | ICD-10-CM | POA: Diagnosis not present

## 2016-01-03 DIAGNOSIS — N186 End stage renal disease: Secondary | ICD-10-CM | POA: Diagnosis not present

## 2016-01-03 DIAGNOSIS — D472 Monoclonal gammopathy: Secondary | ICD-10-CM | POA: Diagnosis not present

## 2016-01-03 DIAGNOSIS — Z992 Dependence on renal dialysis: Secondary | ICD-10-CM | POA: Diagnosis not present

## 2016-01-03 DIAGNOSIS — N2581 Secondary hyperparathyroidism of renal origin: Secondary | ICD-10-CM | POA: Diagnosis not present

## 2016-01-05 DIAGNOSIS — N186 End stage renal disease: Secondary | ICD-10-CM | POA: Diagnosis not present

## 2016-01-05 DIAGNOSIS — Z992 Dependence on renal dialysis: Secondary | ICD-10-CM | POA: Diagnosis not present

## 2016-01-05 DIAGNOSIS — D509 Iron deficiency anemia, unspecified: Secondary | ICD-10-CM | POA: Diagnosis not present

## 2016-01-05 DIAGNOSIS — N2581 Secondary hyperparathyroidism of renal origin: Secondary | ICD-10-CM | POA: Diagnosis not present

## 2016-01-05 DIAGNOSIS — D472 Monoclonal gammopathy: Secondary | ICD-10-CM | POA: Diagnosis not present

## 2016-01-08 DIAGNOSIS — N2581 Secondary hyperparathyroidism of renal origin: Secondary | ICD-10-CM | POA: Diagnosis not present

## 2016-01-08 DIAGNOSIS — D509 Iron deficiency anemia, unspecified: Secondary | ICD-10-CM | POA: Diagnosis not present

## 2016-01-08 DIAGNOSIS — N186 End stage renal disease: Secondary | ICD-10-CM | POA: Diagnosis not present

## 2016-01-08 DIAGNOSIS — D472 Monoclonal gammopathy: Secondary | ICD-10-CM | POA: Diagnosis not present

## 2016-01-08 DIAGNOSIS — Z992 Dependence on renal dialysis: Secondary | ICD-10-CM | POA: Diagnosis not present

## 2016-01-10 DIAGNOSIS — Z992 Dependence on renal dialysis: Secondary | ICD-10-CM | POA: Diagnosis not present

## 2016-01-10 DIAGNOSIS — D509 Iron deficiency anemia, unspecified: Secondary | ICD-10-CM | POA: Diagnosis not present

## 2016-01-10 DIAGNOSIS — N186 End stage renal disease: Secondary | ICD-10-CM | POA: Diagnosis not present

## 2016-01-10 DIAGNOSIS — N2581 Secondary hyperparathyroidism of renal origin: Secondary | ICD-10-CM | POA: Diagnosis not present

## 2016-01-10 DIAGNOSIS — D472 Monoclonal gammopathy: Secondary | ICD-10-CM | POA: Diagnosis not present

## 2016-01-12 DIAGNOSIS — N186 End stage renal disease: Secondary | ICD-10-CM | POA: Diagnosis not present

## 2016-01-12 DIAGNOSIS — D472 Monoclonal gammopathy: Secondary | ICD-10-CM | POA: Diagnosis not present

## 2016-01-12 DIAGNOSIS — D509 Iron deficiency anemia, unspecified: Secondary | ICD-10-CM | POA: Diagnosis not present

## 2016-01-12 DIAGNOSIS — N2581 Secondary hyperparathyroidism of renal origin: Secondary | ICD-10-CM | POA: Diagnosis not present

## 2016-01-12 DIAGNOSIS — Z992 Dependence on renal dialysis: Secondary | ICD-10-CM | POA: Diagnosis not present

## 2016-01-15 DIAGNOSIS — Z992 Dependence on renal dialysis: Secondary | ICD-10-CM | POA: Diagnosis not present

## 2016-01-15 DIAGNOSIS — N2581 Secondary hyperparathyroidism of renal origin: Secondary | ICD-10-CM | POA: Diagnosis not present

## 2016-01-15 DIAGNOSIS — D509 Iron deficiency anemia, unspecified: Secondary | ICD-10-CM | POA: Diagnosis not present

## 2016-01-15 DIAGNOSIS — N186 End stage renal disease: Secondary | ICD-10-CM | POA: Diagnosis not present

## 2016-01-15 DIAGNOSIS — D472 Monoclonal gammopathy: Secondary | ICD-10-CM | POA: Diagnosis not present

## 2016-01-17 DIAGNOSIS — N186 End stage renal disease: Secondary | ICD-10-CM | POA: Diagnosis not present

## 2016-01-17 DIAGNOSIS — D472 Monoclonal gammopathy: Secondary | ICD-10-CM | POA: Diagnosis not present

## 2016-01-17 DIAGNOSIS — D509 Iron deficiency anemia, unspecified: Secondary | ICD-10-CM | POA: Diagnosis not present

## 2016-01-17 DIAGNOSIS — Z992 Dependence on renal dialysis: Secondary | ICD-10-CM | POA: Diagnosis not present

## 2016-01-17 DIAGNOSIS — N2581 Secondary hyperparathyroidism of renal origin: Secondary | ICD-10-CM | POA: Diagnosis not present

## 2016-01-19 DIAGNOSIS — D472 Monoclonal gammopathy: Secondary | ICD-10-CM | POA: Diagnosis not present

## 2016-01-19 DIAGNOSIS — D509 Iron deficiency anemia, unspecified: Secondary | ICD-10-CM | POA: Diagnosis not present

## 2016-01-19 DIAGNOSIS — N2581 Secondary hyperparathyroidism of renal origin: Secondary | ICD-10-CM | POA: Diagnosis not present

## 2016-01-19 DIAGNOSIS — Z992 Dependence on renal dialysis: Secondary | ICD-10-CM | POA: Diagnosis not present

## 2016-01-19 DIAGNOSIS — N186 End stage renal disease: Secondary | ICD-10-CM | POA: Diagnosis not present

## 2016-01-22 DIAGNOSIS — N2581 Secondary hyperparathyroidism of renal origin: Secondary | ICD-10-CM | POA: Diagnosis not present

## 2016-01-22 DIAGNOSIS — D509 Iron deficiency anemia, unspecified: Secondary | ICD-10-CM | POA: Diagnosis not present

## 2016-01-22 DIAGNOSIS — Z992 Dependence on renal dialysis: Secondary | ICD-10-CM | POA: Diagnosis not present

## 2016-01-22 DIAGNOSIS — N186 End stage renal disease: Secondary | ICD-10-CM | POA: Diagnosis not present

## 2016-01-22 DIAGNOSIS — D472 Monoclonal gammopathy: Secondary | ICD-10-CM | POA: Diagnosis not present

## 2016-01-24 DIAGNOSIS — D509 Iron deficiency anemia, unspecified: Secondary | ICD-10-CM | POA: Diagnosis not present

## 2016-01-24 DIAGNOSIS — N186 End stage renal disease: Secondary | ICD-10-CM | POA: Diagnosis not present

## 2016-01-24 DIAGNOSIS — N2581 Secondary hyperparathyroidism of renal origin: Secondary | ICD-10-CM | POA: Diagnosis not present

## 2016-01-24 DIAGNOSIS — Z992 Dependence on renal dialysis: Secondary | ICD-10-CM | POA: Diagnosis not present

## 2016-01-24 DIAGNOSIS — D472 Monoclonal gammopathy: Secondary | ICD-10-CM | POA: Diagnosis not present

## 2016-01-26 DIAGNOSIS — D509 Iron deficiency anemia, unspecified: Secondary | ICD-10-CM | POA: Diagnosis not present

## 2016-01-26 DIAGNOSIS — Z992 Dependence on renal dialysis: Secondary | ICD-10-CM | POA: Diagnosis not present

## 2016-01-26 DIAGNOSIS — N2581 Secondary hyperparathyroidism of renal origin: Secondary | ICD-10-CM | POA: Diagnosis not present

## 2016-01-26 DIAGNOSIS — N186 End stage renal disease: Secondary | ICD-10-CM | POA: Diagnosis not present

## 2016-01-26 DIAGNOSIS — D472 Monoclonal gammopathy: Secondary | ICD-10-CM | POA: Diagnosis not present

## 2016-01-29 DIAGNOSIS — N2581 Secondary hyperparathyroidism of renal origin: Secondary | ICD-10-CM | POA: Diagnosis not present

## 2016-01-29 DIAGNOSIS — Z992 Dependence on renal dialysis: Secondary | ICD-10-CM | POA: Diagnosis not present

## 2016-01-29 DIAGNOSIS — N186 End stage renal disease: Secondary | ICD-10-CM | POA: Diagnosis not present

## 2016-01-29 DIAGNOSIS — D509 Iron deficiency anemia, unspecified: Secondary | ICD-10-CM | POA: Diagnosis not present

## 2016-01-29 DIAGNOSIS — D472 Monoclonal gammopathy: Secondary | ICD-10-CM | POA: Diagnosis not present

## 2016-01-31 DIAGNOSIS — N186 End stage renal disease: Secondary | ICD-10-CM | POA: Diagnosis not present

## 2016-01-31 DIAGNOSIS — Z992 Dependence on renal dialysis: Secondary | ICD-10-CM | POA: Diagnosis not present

## 2016-01-31 DIAGNOSIS — D472 Monoclonal gammopathy: Secondary | ICD-10-CM | POA: Diagnosis not present

## 2016-01-31 DIAGNOSIS — D509 Iron deficiency anemia, unspecified: Secondary | ICD-10-CM | POA: Diagnosis not present

## 2016-01-31 DIAGNOSIS — N2581 Secondary hyperparathyroidism of renal origin: Secondary | ICD-10-CM | POA: Diagnosis not present

## 2016-02-01 DIAGNOSIS — Z992 Dependence on renal dialysis: Secondary | ICD-10-CM | POA: Diagnosis not present

## 2016-02-01 DIAGNOSIS — N186 End stage renal disease: Secondary | ICD-10-CM | POA: Diagnosis not present

## 2016-02-01 DIAGNOSIS — Z6822 Body mass index (BMI) 22.0-22.9, adult: Secondary | ICD-10-CM | POA: Diagnosis not present

## 2016-02-01 DIAGNOSIS — J449 Chronic obstructive pulmonary disease, unspecified: Secondary | ICD-10-CM | POA: Diagnosis not present

## 2016-02-01 DIAGNOSIS — Z299 Encounter for prophylactic measures, unspecified: Secondary | ICD-10-CM | POA: Diagnosis not present

## 2016-02-01 DIAGNOSIS — I272 Other secondary pulmonary hypertension: Secondary | ICD-10-CM | POA: Diagnosis not present

## 2016-02-02 DIAGNOSIS — D509 Iron deficiency anemia, unspecified: Secondary | ICD-10-CM | POA: Diagnosis not present

## 2016-02-02 DIAGNOSIS — Z992 Dependence on renal dialysis: Secondary | ICD-10-CM | POA: Diagnosis not present

## 2016-02-02 DIAGNOSIS — D472 Monoclonal gammopathy: Secondary | ICD-10-CM | POA: Diagnosis not present

## 2016-02-02 DIAGNOSIS — N186 End stage renal disease: Secondary | ICD-10-CM | POA: Diagnosis not present

## 2016-02-02 DIAGNOSIS — N2581 Secondary hyperparathyroidism of renal origin: Secondary | ICD-10-CM | POA: Diagnosis not present

## 2016-02-05 DIAGNOSIS — D472 Monoclonal gammopathy: Secondary | ICD-10-CM | POA: Diagnosis not present

## 2016-02-05 DIAGNOSIS — N2581 Secondary hyperparathyroidism of renal origin: Secondary | ICD-10-CM | POA: Diagnosis not present

## 2016-02-05 DIAGNOSIS — N186 End stage renal disease: Secondary | ICD-10-CM | POA: Diagnosis not present

## 2016-02-05 DIAGNOSIS — Z992 Dependence on renal dialysis: Secondary | ICD-10-CM | POA: Diagnosis not present

## 2016-02-07 DIAGNOSIS — N186 End stage renal disease: Secondary | ICD-10-CM | POA: Diagnosis not present

## 2016-02-07 DIAGNOSIS — Z992 Dependence on renal dialysis: Secondary | ICD-10-CM | POA: Diagnosis not present

## 2016-02-07 DIAGNOSIS — N2581 Secondary hyperparathyroidism of renal origin: Secondary | ICD-10-CM | POA: Diagnosis not present

## 2016-02-07 DIAGNOSIS — D472 Monoclonal gammopathy: Secondary | ICD-10-CM | POA: Diagnosis not present

## 2016-02-08 DIAGNOSIS — I10 Essential (primary) hypertension: Secondary | ICD-10-CM | POA: Diagnosis not present

## 2016-02-08 DIAGNOSIS — E7800 Pure hypercholesterolemia, unspecified: Secondary | ICD-10-CM | POA: Diagnosis not present

## 2016-02-09 DIAGNOSIS — N186 End stage renal disease: Secondary | ICD-10-CM | POA: Diagnosis not present

## 2016-02-09 DIAGNOSIS — D472 Monoclonal gammopathy: Secondary | ICD-10-CM | POA: Diagnosis not present

## 2016-02-09 DIAGNOSIS — Z992 Dependence on renal dialysis: Secondary | ICD-10-CM | POA: Diagnosis not present

## 2016-02-09 DIAGNOSIS — N2581 Secondary hyperparathyroidism of renal origin: Secondary | ICD-10-CM | POA: Diagnosis not present

## 2016-02-12 DIAGNOSIS — Z992 Dependence on renal dialysis: Secondary | ICD-10-CM | POA: Diagnosis not present

## 2016-02-12 DIAGNOSIS — N2581 Secondary hyperparathyroidism of renal origin: Secondary | ICD-10-CM | POA: Diagnosis not present

## 2016-02-12 DIAGNOSIS — N186 End stage renal disease: Secondary | ICD-10-CM | POA: Diagnosis not present

## 2016-02-12 DIAGNOSIS — D472 Monoclonal gammopathy: Secondary | ICD-10-CM | POA: Diagnosis not present

## 2016-02-14 DIAGNOSIS — N186 End stage renal disease: Secondary | ICD-10-CM | POA: Diagnosis not present

## 2016-02-14 DIAGNOSIS — Z992 Dependence on renal dialysis: Secondary | ICD-10-CM | POA: Diagnosis not present

## 2016-02-14 DIAGNOSIS — D472 Monoclonal gammopathy: Secondary | ICD-10-CM | POA: Diagnosis not present

## 2016-02-14 DIAGNOSIS — N2581 Secondary hyperparathyroidism of renal origin: Secondary | ICD-10-CM | POA: Diagnosis not present

## 2016-02-16 DIAGNOSIS — D472 Monoclonal gammopathy: Secondary | ICD-10-CM | POA: Diagnosis not present

## 2016-02-16 DIAGNOSIS — N186 End stage renal disease: Secondary | ICD-10-CM | POA: Diagnosis not present

## 2016-02-16 DIAGNOSIS — Z992 Dependence on renal dialysis: Secondary | ICD-10-CM | POA: Diagnosis not present

## 2016-02-16 DIAGNOSIS — N2581 Secondary hyperparathyroidism of renal origin: Secondary | ICD-10-CM | POA: Diagnosis not present

## 2016-02-19 DIAGNOSIS — N2581 Secondary hyperparathyroidism of renal origin: Secondary | ICD-10-CM | POA: Diagnosis not present

## 2016-02-19 DIAGNOSIS — Z992 Dependence on renal dialysis: Secondary | ICD-10-CM | POA: Diagnosis not present

## 2016-02-19 DIAGNOSIS — N186 End stage renal disease: Secondary | ICD-10-CM | POA: Diagnosis not present

## 2016-02-19 DIAGNOSIS — D472 Monoclonal gammopathy: Secondary | ICD-10-CM | POA: Diagnosis not present

## 2016-02-21 DIAGNOSIS — D472 Monoclonal gammopathy: Secondary | ICD-10-CM | POA: Diagnosis not present

## 2016-02-21 DIAGNOSIS — Z992 Dependence on renal dialysis: Secondary | ICD-10-CM | POA: Diagnosis not present

## 2016-02-21 DIAGNOSIS — N2581 Secondary hyperparathyroidism of renal origin: Secondary | ICD-10-CM | POA: Diagnosis not present

## 2016-02-21 DIAGNOSIS — N186 End stage renal disease: Secondary | ICD-10-CM | POA: Diagnosis not present

## 2016-02-23 DIAGNOSIS — D472 Monoclonal gammopathy: Secondary | ICD-10-CM | POA: Diagnosis not present

## 2016-02-23 DIAGNOSIS — N186 End stage renal disease: Secondary | ICD-10-CM | POA: Diagnosis not present

## 2016-02-23 DIAGNOSIS — Z992 Dependence on renal dialysis: Secondary | ICD-10-CM | POA: Diagnosis not present

## 2016-02-23 DIAGNOSIS — N2581 Secondary hyperparathyroidism of renal origin: Secondary | ICD-10-CM | POA: Diagnosis not present

## 2016-02-26 DIAGNOSIS — Z992 Dependence on renal dialysis: Secondary | ICD-10-CM | POA: Diagnosis not present

## 2016-02-26 DIAGNOSIS — D472 Monoclonal gammopathy: Secondary | ICD-10-CM | POA: Diagnosis not present

## 2016-02-26 DIAGNOSIS — N186 End stage renal disease: Secondary | ICD-10-CM | POA: Diagnosis not present

## 2016-02-26 DIAGNOSIS — N2581 Secondary hyperparathyroidism of renal origin: Secondary | ICD-10-CM | POA: Diagnosis not present

## 2016-02-28 DIAGNOSIS — D472 Monoclonal gammopathy: Secondary | ICD-10-CM | POA: Diagnosis not present

## 2016-02-28 DIAGNOSIS — Z992 Dependence on renal dialysis: Secondary | ICD-10-CM | POA: Diagnosis not present

## 2016-02-28 DIAGNOSIS — N2581 Secondary hyperparathyroidism of renal origin: Secondary | ICD-10-CM | POA: Diagnosis not present

## 2016-02-28 DIAGNOSIS — N186 End stage renal disease: Secondary | ICD-10-CM | POA: Diagnosis not present

## 2016-03-01 DIAGNOSIS — N186 End stage renal disease: Secondary | ICD-10-CM | POA: Diagnosis not present

## 2016-03-01 DIAGNOSIS — D472 Monoclonal gammopathy: Secondary | ICD-10-CM | POA: Diagnosis not present

## 2016-03-01 DIAGNOSIS — Z992 Dependence on renal dialysis: Secondary | ICD-10-CM | POA: Diagnosis not present

## 2016-03-01 DIAGNOSIS — N2581 Secondary hyperparathyroidism of renal origin: Secondary | ICD-10-CM | POA: Diagnosis not present

## 2016-03-04 DIAGNOSIS — N2581 Secondary hyperparathyroidism of renal origin: Secondary | ICD-10-CM | POA: Diagnosis not present

## 2016-03-04 DIAGNOSIS — D472 Monoclonal gammopathy: Secondary | ICD-10-CM | POA: Diagnosis not present

## 2016-03-04 DIAGNOSIS — Z992 Dependence on renal dialysis: Secondary | ICD-10-CM | POA: Diagnosis not present

## 2016-03-04 DIAGNOSIS — N186 End stage renal disease: Secondary | ICD-10-CM | POA: Diagnosis not present

## 2016-03-05 DIAGNOSIS — H401133 Primary open-angle glaucoma, bilateral, severe stage: Secondary | ICD-10-CM | POA: Diagnosis not present

## 2016-03-05 DIAGNOSIS — H2511 Age-related nuclear cataract, right eye: Secondary | ICD-10-CM | POA: Diagnosis not present

## 2016-03-06 DIAGNOSIS — N186 End stage renal disease: Secondary | ICD-10-CM | POA: Diagnosis not present

## 2016-03-06 DIAGNOSIS — Z992 Dependence on renal dialysis: Secondary | ICD-10-CM | POA: Diagnosis not present

## 2016-03-06 DIAGNOSIS — D472 Monoclonal gammopathy: Secondary | ICD-10-CM | POA: Diagnosis not present

## 2016-03-06 DIAGNOSIS — N2581 Secondary hyperparathyroidism of renal origin: Secondary | ICD-10-CM | POA: Diagnosis not present

## 2016-03-08 DIAGNOSIS — D472 Monoclonal gammopathy: Secondary | ICD-10-CM | POA: Diagnosis not present

## 2016-03-08 DIAGNOSIS — N2581 Secondary hyperparathyroidism of renal origin: Secondary | ICD-10-CM | POA: Diagnosis not present

## 2016-03-08 DIAGNOSIS — N186 End stage renal disease: Secondary | ICD-10-CM | POA: Diagnosis not present

## 2016-03-08 DIAGNOSIS — Z992 Dependence on renal dialysis: Secondary | ICD-10-CM | POA: Diagnosis not present

## 2016-03-11 DIAGNOSIS — H401133 Primary open-angle glaucoma, bilateral, severe stage: Secondary | ICD-10-CM | POA: Diagnosis not present

## 2016-03-11 DIAGNOSIS — Z992 Dependence on renal dialysis: Secondary | ICD-10-CM | POA: Diagnosis not present

## 2016-03-11 DIAGNOSIS — N186 End stage renal disease: Secondary | ICD-10-CM | POA: Diagnosis not present

## 2016-03-11 DIAGNOSIS — N2581 Secondary hyperparathyroidism of renal origin: Secondary | ICD-10-CM | POA: Diagnosis not present

## 2016-03-11 DIAGNOSIS — D472 Monoclonal gammopathy: Secondary | ICD-10-CM | POA: Diagnosis not present

## 2016-03-11 DIAGNOSIS — H2511 Age-related nuclear cataract, right eye: Secondary | ICD-10-CM | POA: Diagnosis not present

## 2016-03-12 DIAGNOSIS — E7800 Pure hypercholesterolemia, unspecified: Secondary | ICD-10-CM | POA: Diagnosis not present

## 2016-03-12 DIAGNOSIS — I10 Essential (primary) hypertension: Secondary | ICD-10-CM | POA: Diagnosis not present

## 2016-03-13 DIAGNOSIS — N2581 Secondary hyperparathyroidism of renal origin: Secondary | ICD-10-CM | POA: Diagnosis not present

## 2016-03-13 DIAGNOSIS — N186 End stage renal disease: Secondary | ICD-10-CM | POA: Diagnosis not present

## 2016-03-13 DIAGNOSIS — D472 Monoclonal gammopathy: Secondary | ICD-10-CM | POA: Diagnosis not present

## 2016-03-13 DIAGNOSIS — Z992 Dependence on renal dialysis: Secondary | ICD-10-CM | POA: Diagnosis not present

## 2016-03-15 DIAGNOSIS — N2581 Secondary hyperparathyroidism of renal origin: Secondary | ICD-10-CM | POA: Diagnosis not present

## 2016-03-15 DIAGNOSIS — D472 Monoclonal gammopathy: Secondary | ICD-10-CM | POA: Diagnosis not present

## 2016-03-15 DIAGNOSIS — N186 End stage renal disease: Secondary | ICD-10-CM | POA: Diagnosis not present

## 2016-03-15 DIAGNOSIS — Z992 Dependence on renal dialysis: Secondary | ICD-10-CM | POA: Diagnosis not present

## 2016-03-16 DIAGNOSIS — Z992 Dependence on renal dialysis: Secondary | ICD-10-CM | POA: Diagnosis not present

## 2016-03-16 DIAGNOSIS — D472 Monoclonal gammopathy: Secondary | ICD-10-CM | POA: Diagnosis not present

## 2016-03-16 DIAGNOSIS — N186 End stage renal disease: Secondary | ICD-10-CM | POA: Diagnosis not present

## 2016-03-16 DIAGNOSIS — E8770 Fluid overload, unspecified: Secondary | ICD-10-CM | POA: Diagnosis not present

## 2016-03-18 DIAGNOSIS — Z992 Dependence on renal dialysis: Secondary | ICD-10-CM | POA: Diagnosis not present

## 2016-03-18 DIAGNOSIS — D472 Monoclonal gammopathy: Secondary | ICD-10-CM | POA: Diagnosis not present

## 2016-03-18 DIAGNOSIS — N2581 Secondary hyperparathyroidism of renal origin: Secondary | ICD-10-CM | POA: Diagnosis not present

## 2016-03-18 DIAGNOSIS — N186 End stage renal disease: Secondary | ICD-10-CM | POA: Diagnosis not present

## 2016-03-20 DIAGNOSIS — N2581 Secondary hyperparathyroidism of renal origin: Secondary | ICD-10-CM | POA: Diagnosis not present

## 2016-03-20 DIAGNOSIS — N186 End stage renal disease: Secondary | ICD-10-CM | POA: Diagnosis not present

## 2016-03-20 DIAGNOSIS — D472 Monoclonal gammopathy: Secondary | ICD-10-CM | POA: Diagnosis not present

## 2016-03-20 DIAGNOSIS — Z992 Dependence on renal dialysis: Secondary | ICD-10-CM | POA: Diagnosis not present

## 2016-03-21 DIAGNOSIS — H401133 Primary open-angle glaucoma, bilateral, severe stage: Secondary | ICD-10-CM | POA: Diagnosis not present

## 2016-03-22 DIAGNOSIS — N2581 Secondary hyperparathyroidism of renal origin: Secondary | ICD-10-CM | POA: Diagnosis not present

## 2016-03-22 DIAGNOSIS — D472 Monoclonal gammopathy: Secondary | ICD-10-CM | POA: Diagnosis not present

## 2016-03-22 DIAGNOSIS — Z992 Dependence on renal dialysis: Secondary | ICD-10-CM | POA: Diagnosis not present

## 2016-03-22 DIAGNOSIS — N186 End stage renal disease: Secondary | ICD-10-CM | POA: Diagnosis not present

## 2016-03-25 DIAGNOSIS — Z992 Dependence on renal dialysis: Secondary | ICD-10-CM | POA: Diagnosis not present

## 2016-03-25 DIAGNOSIS — N2581 Secondary hyperparathyroidism of renal origin: Secondary | ICD-10-CM | POA: Diagnosis not present

## 2016-03-25 DIAGNOSIS — N186 End stage renal disease: Secondary | ICD-10-CM | POA: Diagnosis not present

## 2016-03-25 DIAGNOSIS — D472 Monoclonal gammopathy: Secondary | ICD-10-CM | POA: Diagnosis not present

## 2016-03-26 DIAGNOSIS — N390 Urinary tract infection, site not specified: Secondary | ICD-10-CM | POA: Diagnosis not present

## 2016-03-27 DIAGNOSIS — D472 Monoclonal gammopathy: Secondary | ICD-10-CM | POA: Diagnosis not present

## 2016-03-27 DIAGNOSIS — N2581 Secondary hyperparathyroidism of renal origin: Secondary | ICD-10-CM | POA: Diagnosis not present

## 2016-03-27 DIAGNOSIS — Z992 Dependence on renal dialysis: Secondary | ICD-10-CM | POA: Diagnosis not present

## 2016-03-27 DIAGNOSIS — N186 End stage renal disease: Secondary | ICD-10-CM | POA: Diagnosis not present

## 2016-03-29 DIAGNOSIS — Z992 Dependence on renal dialysis: Secondary | ICD-10-CM | POA: Diagnosis not present

## 2016-03-29 DIAGNOSIS — N186 End stage renal disease: Secondary | ICD-10-CM | POA: Diagnosis not present

## 2016-03-29 DIAGNOSIS — D472 Monoclonal gammopathy: Secondary | ICD-10-CM | POA: Diagnosis not present

## 2016-03-29 DIAGNOSIS — N2581 Secondary hyperparathyroidism of renal origin: Secondary | ICD-10-CM | POA: Diagnosis not present

## 2016-04-01 DIAGNOSIS — Z992 Dependence on renal dialysis: Secondary | ICD-10-CM | POA: Diagnosis not present

## 2016-04-01 DIAGNOSIS — N186 End stage renal disease: Secondary | ICD-10-CM | POA: Diagnosis not present

## 2016-04-01 DIAGNOSIS — N2581 Secondary hyperparathyroidism of renal origin: Secondary | ICD-10-CM | POA: Diagnosis not present

## 2016-04-01 DIAGNOSIS — D472 Monoclonal gammopathy: Secondary | ICD-10-CM | POA: Diagnosis not present

## 2016-04-03 DIAGNOSIS — H401133 Primary open-angle glaucoma, bilateral, severe stage: Secondary | ICD-10-CM | POA: Diagnosis not present

## 2016-04-03 DIAGNOSIS — N2581 Secondary hyperparathyroidism of renal origin: Secondary | ICD-10-CM | POA: Diagnosis not present

## 2016-04-03 DIAGNOSIS — Z992 Dependence on renal dialysis: Secondary | ICD-10-CM | POA: Diagnosis not present

## 2016-04-03 DIAGNOSIS — D472 Monoclonal gammopathy: Secondary | ICD-10-CM | POA: Diagnosis not present

## 2016-04-03 DIAGNOSIS — N186 End stage renal disease: Secondary | ICD-10-CM | POA: Diagnosis not present

## 2016-04-04 DIAGNOSIS — N186 End stage renal disease: Secondary | ICD-10-CM | POA: Diagnosis not present

## 2016-04-04 DIAGNOSIS — Z992 Dependence on renal dialysis: Secondary | ICD-10-CM | POA: Diagnosis not present

## 2016-04-05 DIAGNOSIS — N2581 Secondary hyperparathyroidism of renal origin: Secondary | ICD-10-CM | POA: Diagnosis not present

## 2016-04-05 DIAGNOSIS — D631 Anemia in chronic kidney disease: Secondary | ICD-10-CM | POA: Diagnosis not present

## 2016-04-05 DIAGNOSIS — D472 Monoclonal gammopathy: Secondary | ICD-10-CM | POA: Diagnosis not present

## 2016-04-05 DIAGNOSIS — Z23 Encounter for immunization: Secondary | ICD-10-CM | POA: Diagnosis not present

## 2016-04-05 DIAGNOSIS — Z992 Dependence on renal dialysis: Secondary | ICD-10-CM | POA: Diagnosis not present

## 2016-04-05 DIAGNOSIS — D509 Iron deficiency anemia, unspecified: Secondary | ICD-10-CM | POA: Diagnosis not present

## 2016-04-05 DIAGNOSIS — N186 End stage renal disease: Secondary | ICD-10-CM | POA: Diagnosis not present

## 2016-04-08 DIAGNOSIS — Z23 Encounter for immunization: Secondary | ICD-10-CM | POA: Diagnosis not present

## 2016-04-08 DIAGNOSIS — N2581 Secondary hyperparathyroidism of renal origin: Secondary | ICD-10-CM | POA: Diagnosis not present

## 2016-04-08 DIAGNOSIS — D509 Iron deficiency anemia, unspecified: Secondary | ICD-10-CM | POA: Diagnosis not present

## 2016-04-08 DIAGNOSIS — D631 Anemia in chronic kidney disease: Secondary | ICD-10-CM | POA: Diagnosis not present

## 2016-04-08 DIAGNOSIS — D472 Monoclonal gammopathy: Secondary | ICD-10-CM | POA: Diagnosis not present

## 2016-04-08 DIAGNOSIS — N186 End stage renal disease: Secondary | ICD-10-CM | POA: Diagnosis not present

## 2016-04-10 DIAGNOSIS — N186 End stage renal disease: Secondary | ICD-10-CM | POA: Diagnosis not present

## 2016-04-10 DIAGNOSIS — N2581 Secondary hyperparathyroidism of renal origin: Secondary | ICD-10-CM | POA: Diagnosis not present

## 2016-04-10 DIAGNOSIS — D509 Iron deficiency anemia, unspecified: Secondary | ICD-10-CM | POA: Diagnosis not present

## 2016-04-10 DIAGNOSIS — Z23 Encounter for immunization: Secondary | ICD-10-CM | POA: Diagnosis not present

## 2016-04-10 DIAGNOSIS — D631 Anemia in chronic kidney disease: Secondary | ICD-10-CM | POA: Diagnosis not present

## 2016-04-10 DIAGNOSIS — D472 Monoclonal gammopathy: Secondary | ICD-10-CM | POA: Diagnosis not present

## 2016-04-12 DIAGNOSIS — N2581 Secondary hyperparathyroidism of renal origin: Secondary | ICD-10-CM | POA: Diagnosis not present

## 2016-04-12 DIAGNOSIS — Z23 Encounter for immunization: Secondary | ICD-10-CM | POA: Diagnosis not present

## 2016-04-12 DIAGNOSIS — D509 Iron deficiency anemia, unspecified: Secondary | ICD-10-CM | POA: Diagnosis not present

## 2016-04-12 DIAGNOSIS — N186 End stage renal disease: Secondary | ICD-10-CM | POA: Diagnosis not present

## 2016-04-12 DIAGNOSIS — D472 Monoclonal gammopathy: Secondary | ICD-10-CM | POA: Diagnosis not present

## 2016-04-12 DIAGNOSIS — D631 Anemia in chronic kidney disease: Secondary | ICD-10-CM | POA: Diagnosis not present

## 2016-04-14 DIAGNOSIS — N186 End stage renal disease: Secondary | ICD-10-CM | POA: Diagnosis not present

## 2016-04-14 DIAGNOSIS — N2581 Secondary hyperparathyroidism of renal origin: Secondary | ICD-10-CM | POA: Diagnosis not present

## 2016-04-14 DIAGNOSIS — D472 Monoclonal gammopathy: Secondary | ICD-10-CM | POA: Diagnosis not present

## 2016-04-14 DIAGNOSIS — Z23 Encounter for immunization: Secondary | ICD-10-CM | POA: Diagnosis not present

## 2016-04-14 DIAGNOSIS — D509 Iron deficiency anemia, unspecified: Secondary | ICD-10-CM | POA: Diagnosis not present

## 2016-04-14 DIAGNOSIS — D631 Anemia in chronic kidney disease: Secondary | ICD-10-CM | POA: Diagnosis not present

## 2016-04-17 DIAGNOSIS — Z23 Encounter for immunization: Secondary | ICD-10-CM | POA: Diagnosis not present

## 2016-04-17 DIAGNOSIS — D509 Iron deficiency anemia, unspecified: Secondary | ICD-10-CM | POA: Diagnosis not present

## 2016-04-17 DIAGNOSIS — D472 Monoclonal gammopathy: Secondary | ICD-10-CM | POA: Diagnosis not present

## 2016-04-17 DIAGNOSIS — N186 End stage renal disease: Secondary | ICD-10-CM | POA: Diagnosis not present

## 2016-04-17 DIAGNOSIS — D631 Anemia in chronic kidney disease: Secondary | ICD-10-CM | POA: Diagnosis not present

## 2016-04-17 DIAGNOSIS — N2581 Secondary hyperparathyroidism of renal origin: Secondary | ICD-10-CM | POA: Diagnosis not present

## 2016-04-18 DIAGNOSIS — H401133 Primary open-angle glaucoma, bilateral, severe stage: Secondary | ICD-10-CM | POA: Diagnosis not present

## 2016-04-19 DIAGNOSIS — N2581 Secondary hyperparathyroidism of renal origin: Secondary | ICD-10-CM | POA: Diagnosis not present

## 2016-04-19 DIAGNOSIS — D509 Iron deficiency anemia, unspecified: Secondary | ICD-10-CM | POA: Diagnosis not present

## 2016-04-19 DIAGNOSIS — D472 Monoclonal gammopathy: Secondary | ICD-10-CM | POA: Diagnosis not present

## 2016-04-19 DIAGNOSIS — Z23 Encounter for immunization: Secondary | ICD-10-CM | POA: Diagnosis not present

## 2016-04-19 DIAGNOSIS — N186 End stage renal disease: Secondary | ICD-10-CM | POA: Diagnosis not present

## 2016-04-19 DIAGNOSIS — D631 Anemia in chronic kidney disease: Secondary | ICD-10-CM | POA: Diagnosis not present

## 2016-04-22 DIAGNOSIS — Z23 Encounter for immunization: Secondary | ICD-10-CM | POA: Diagnosis not present

## 2016-04-22 DIAGNOSIS — D472 Monoclonal gammopathy: Secondary | ICD-10-CM | POA: Diagnosis not present

## 2016-04-22 DIAGNOSIS — D509 Iron deficiency anemia, unspecified: Secondary | ICD-10-CM | POA: Diagnosis not present

## 2016-04-22 DIAGNOSIS — D631 Anemia in chronic kidney disease: Secondary | ICD-10-CM | POA: Diagnosis not present

## 2016-04-22 DIAGNOSIS — N2581 Secondary hyperparathyroidism of renal origin: Secondary | ICD-10-CM | POA: Diagnosis not present

## 2016-04-22 DIAGNOSIS — N186 End stage renal disease: Secondary | ICD-10-CM | POA: Diagnosis not present

## 2016-04-24 DIAGNOSIS — N186 End stage renal disease: Secondary | ICD-10-CM | POA: Diagnosis not present

## 2016-04-24 DIAGNOSIS — D509 Iron deficiency anemia, unspecified: Secondary | ICD-10-CM | POA: Diagnosis not present

## 2016-04-24 DIAGNOSIS — D631 Anemia in chronic kidney disease: Secondary | ICD-10-CM | POA: Diagnosis not present

## 2016-04-24 DIAGNOSIS — Z23 Encounter for immunization: Secondary | ICD-10-CM | POA: Diagnosis not present

## 2016-04-24 DIAGNOSIS — D472 Monoclonal gammopathy: Secondary | ICD-10-CM | POA: Diagnosis not present

## 2016-04-24 DIAGNOSIS — N2581 Secondary hyperparathyroidism of renal origin: Secondary | ICD-10-CM | POA: Diagnosis not present

## 2016-04-26 DIAGNOSIS — Z23 Encounter for immunization: Secondary | ICD-10-CM | POA: Diagnosis not present

## 2016-04-26 DIAGNOSIS — D631 Anemia in chronic kidney disease: Secondary | ICD-10-CM | POA: Diagnosis not present

## 2016-04-26 DIAGNOSIS — N2581 Secondary hyperparathyroidism of renal origin: Secondary | ICD-10-CM | POA: Diagnosis not present

## 2016-04-26 DIAGNOSIS — D509 Iron deficiency anemia, unspecified: Secondary | ICD-10-CM | POA: Diagnosis not present

## 2016-04-26 DIAGNOSIS — D472 Monoclonal gammopathy: Secondary | ICD-10-CM | POA: Diagnosis not present

## 2016-04-26 DIAGNOSIS — N186 End stage renal disease: Secondary | ICD-10-CM | POA: Diagnosis not present

## 2016-04-29 DIAGNOSIS — D509 Iron deficiency anemia, unspecified: Secondary | ICD-10-CM | POA: Diagnosis not present

## 2016-04-29 DIAGNOSIS — N2581 Secondary hyperparathyroidism of renal origin: Secondary | ICD-10-CM | POA: Diagnosis not present

## 2016-04-29 DIAGNOSIS — N186 End stage renal disease: Secondary | ICD-10-CM | POA: Diagnosis not present

## 2016-04-29 DIAGNOSIS — Z23 Encounter for immunization: Secondary | ICD-10-CM | POA: Diagnosis not present

## 2016-04-29 DIAGNOSIS — D472 Monoclonal gammopathy: Secondary | ICD-10-CM | POA: Diagnosis not present

## 2016-04-29 DIAGNOSIS — D631 Anemia in chronic kidney disease: Secondary | ICD-10-CM | POA: Diagnosis not present

## 2016-04-30 ENCOUNTER — Encounter (HOSPITAL_COMMUNITY): Payer: Medicare Other

## 2016-04-30 ENCOUNTER — Encounter (HOSPITAL_COMMUNITY): Payer: Medicare Other | Attending: Hematology & Oncology | Admitting: Hematology & Oncology

## 2016-04-30 ENCOUNTER — Encounter (HOSPITAL_COMMUNITY): Payer: Self-pay | Admitting: Hematology & Oncology

## 2016-04-30 VITALS — BP 97/69 | HR 58 | Temp 97.5°F | Resp 16 | Ht 71.0 in | Wt 146.6 lb

## 2016-04-30 DIAGNOSIS — Z992 Dependence on renal dialysis: Secondary | ICD-10-CM | POA: Diagnosis not present

## 2016-04-30 DIAGNOSIS — Z800 Family history of malignant neoplasm of digestive organs: Secondary | ICD-10-CM | POA: Diagnosis not present

## 2016-04-30 DIAGNOSIS — C9000 Multiple myeloma not having achieved remission: Secondary | ICD-10-CM | POA: Insufficient documentation

## 2016-04-30 DIAGNOSIS — D472 Monoclonal gammopathy: Secondary | ICD-10-CM | POA: Diagnosis present

## 2016-04-30 DIAGNOSIS — I10 Essential (primary) hypertension: Secondary | ICD-10-CM

## 2016-04-30 DIAGNOSIS — N186 End stage renal disease: Secondary | ICD-10-CM | POA: Diagnosis not present

## 2016-04-30 DIAGNOSIS — Z940 Kidney transplant status: Secondary | ICD-10-CM | POA: Diagnosis not present

## 2016-04-30 HISTORY — DX: Monoclonal gammopathy: D47.2

## 2016-04-30 HISTORY — DX: Multiple myeloma not having achieved remission: C90.00

## 2016-04-30 LAB — COMPREHENSIVE METABOLIC PANEL
ALT: 9 U/L — AB (ref 17–63)
AST: 18 U/L (ref 15–41)
Albumin: 4.4 g/dL (ref 3.5–5.0)
Alkaline Phosphatase: 49 U/L (ref 38–126)
Anion gap: 9 (ref 5–15)
BILIRUBIN TOTAL: 0.7 mg/dL (ref 0.3–1.2)
BUN: 28 mg/dL — AB (ref 6–20)
CALCIUM: 9.2 mg/dL (ref 8.9–10.3)
CO2: 29 mmol/L (ref 22–32)
CREATININE: 7.8 mg/dL — AB (ref 0.61–1.24)
Chloride: 98 mmol/L — ABNORMAL LOW (ref 101–111)
GFR, EST AFRICAN AMERICAN: 8 mL/min — AB (ref >60)
GFR, EST NON AFRICAN AMERICAN: 6 mL/min — AB (ref >60)
Glucose, Bld: 93 mg/dL (ref 65–99)
Potassium: 4.7 mmol/L (ref 3.5–5.1)
Sodium: 136 mmol/L (ref 135–145)
TOTAL PROTEIN: 9.1 g/dL — AB (ref 6.5–8.1)

## 2016-04-30 LAB — CBC WITH DIFFERENTIAL/PLATELET
BASOS ABS: 0 10*3/uL (ref 0.0–0.1)
Basophils Relative: 1 %
EOS PCT: 3 %
Eosinophils Absolute: 0.2 10*3/uL (ref 0.0–0.7)
HEMATOCRIT: 34.7 % — AB (ref 39.0–52.0)
Hemoglobin: 11.3 g/dL — ABNORMAL LOW (ref 13.0–17.0)
LYMPHS PCT: 38 %
Lymphs Abs: 2 10*3/uL (ref 0.7–4.0)
MCH: 25.6 pg — AB (ref 26.0–34.0)
MCHC: 32.6 g/dL (ref 30.0–36.0)
MCV: 78.5 fL (ref 78.0–100.0)
MONO ABS: 0.5 10*3/uL (ref 0.1–1.0)
Monocytes Relative: 9 %
Neutro Abs: 2.5 10*3/uL (ref 1.7–7.7)
Neutrophils Relative %: 49 %
Platelets: 120 10*3/uL — ABNORMAL LOW (ref 150–400)
RBC: 4.42 MIL/uL (ref 4.22–5.81)
RDW: 14.1 % (ref 11.5–15.5)
WBC: 5.1 10*3/uL (ref 4.0–10.5)

## 2016-04-30 NOTE — Patient Instructions (Addendum)
[place] at [facility] Discharge Instructions  RECOMMENDATIONS MADE BY THE CONSULTANT AND ANY TEST RESULTS WILL BE SENT TO YOUR REFERRING PHYSICIAN.  You saw Dr. Whitney Muse today. Lab work today. Follow up in 6 months with labs.  Thank you for choosing [place] at [facility] to provide your oncology and hematology care.  To afford each patient quality time with our provider, please arrive at least 15 minutes before your scheduled appointment time.   Beginning January 23rd 2017 lab work for the [facility] will be done in the  Main lab at [facility] on 1st floor. If you have a lab appointment with the [facility] please come in thru the  Main Entrance and check in at the main information desk  You need to re-schedule your appointment should you arrive 10 or more minutes late.  We strive to give you quality time with our providers, and arriving late affects you and other patients whose appointments are after yours.  Also, if you no show three or more times for appointments you may be dismissed from the clinic at the providers discretion.     Again, thank you for choosing [facility].  Our hope is that these requests will decrease the amount of time that you wait before being seen by our physicians.       _____________________________________________________________  Should you have questions after your visit to [facility], please contact our office at [phone] between the hours of 8:30 a.m. and 4:30 p.m.  Voicemails left after 4:30 p.m. will not be returned until the following business day.  For prescription refill requests, have your pharmacy contact our office.         Resources For Cancer Patients and their Caregivers ? American Cancer Society: Can assist with transportation, wigs, general needs, runs Look Good Feel Better.        610-300-5274 ? Cancer Care: Provides financial assistance, online  support groups, medication/co-pay assistance.  1-800-813-HOPE 805 551 6730) ? [facility] Assists [place] cancer patients and their families through emotional , educational and financial support.  (931) 583-4009 ? [place] DSS Where to apply for food stamps, Medicaid and utility assistance. (252) 290-6029 ? [facility]: Transportation to medical appointments. 239 543 3968 ? Social Security Administration: May apply for disability if have a Stage IV cancer. 757-133-7394 (281)031-8398 ? [employer], Disability and Transit Services: Assists with nutrition, care and transit needs. [place] Support Programs: @10RELATIVEDAYS @ > Cancer Support Group  2nd Tuesday of the month 1pm-2pm, Journey Room  > Creative Journey  3rd Tuesday of the month 1130am-1pm, Journey Room  > Look Good Feel Better  1st Wednesday of the month 10am-12 noon, Journey Room (Call [place] to register [phone])

## 2016-04-30 NOTE — Progress Notes (Signed)
[facility] NOTE  Patient Care Team: [name], MD as PCP - General (Internal Medicine) Fran Lowes, MD as Attending Physician (Nephrology)  CHIEF COMPLAINTS/PURPOSE OF CONSULTATION:  MGUS End stage renal disease, on hemodialysis  HISTORY OF PRESENTING ILLNESS:  Brian Liu 65 y.o. male is here because of referral from [facility]. He has a history of MGUS, BMBX back in 06/2014. He has been on dialysis since 2003. He had a kidney transplant in 1997. He notes he developed renal failure from HTN.  He has no complaints today. He notes he is here to "get back into his prior routine." He has no questions regarding MGUS. He was previously followed by Dr. Jacquiline Doe on a yearly basis last seen in 02/2016.    He notes he has a fairly good appetite he describes it as 50/50. He cooks. He has 2 sons who live locally and check in on him. He is widowed.  He is fairly active. He loves basketball. He notes he can no longer play. He denies new pain.   MEDICAL HISTORY:  Past Medical History:  Diagnosis Date  . Atrial fibrillation (HCC)    Permanent, Not on Coumadin because of history of lower GI bleed  . Diastolic dysfunction    Restrictive  diastolic filling pattern echo,  February, 2013  . Dyslipidemia   . Ejection fraction    EF 60-65%, echo, 2013  . ESRD (end stage renal disease) ([place])    Dialysis, fistula in left arm  . Hypertension   . Lower gastrointestinal bleed   . LVH (left ventricular hypertrophy)   . Mitral regurgitation    Mild by echo, 2013  . Pancytopenia ([place])    Dr Ignacia Bayley  . Pulmonary hypertension (HCC)    Severe, PA pressure 65-70 mm of mercury, echo, 2013  . Tobacco abuse     SURGICAL HISTORY: Past Surgical History:  Procedure Laterality Date  . AV FISTULA PLACEMENT Left 1992   Left arm   . KIDNEY TRANSPLANT     failed transplant after 7 years, placed at [facility]: Social History   Social History  .  Marital status: Widowed    Spouse name: N/A  . Number of children: N/A  . Years of education: N/A   Occupational History  . Not on file.   Social History Main Topics  . Smoking status: Never Smoker  . Smokeless tobacco: Never Used  . Alcohol use No  . Drug use: No  . Sexual activity: Not on file   Other Topics Concern  . Not on file   Social History Narrative   Lives alone in a one story home.  Has 2 sons.     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     Education high school.   Widowed since 2003. 2 boys. One grandson. Used to drive a forklift. Born here in [place]. Non/never smoker No Pets. Loves basketball. Used to play frequently, now just watches.   FAMILY HISTORY: Family History  Problem Relation Age of Onset  . Other Mother     Deceased, 67  . Stomach cancer Father     Deceased, 71  . Kidney failure Son   . Healthy Son   7 sisters, 20 living 4 brother 2 living Mother died at 33 from natural causes Father died at 15 from stomach cancer He notes his deceased siblings died from stomach cancer,  complications of HTN, heart issues  ALLERGIES:  has No Known Allergies.  MEDICATIONS:  Current Outpatient Prescriptions  Medication Sig Dispense Refill  . AZOPT 1 % ophthalmic suspension   4  . bimatoprost (LUMIGAN) 0.03 % ophthalmic solution 1 drop.    . cinacalcet (SENSIPAR) 30 MG tablet Take 30 mg by mouth.    . folic acid (FOLVITE) 1 MG tablet Take 1 mg by mouth daily.    [name] gabapentin (NEURONTIN) 300 MG capsule Take 1 capsule (300 mg total) by mouth 3 (three) times daily. 300 capsule 3  . labetalol (NORMODYNE) 100 MG tablet TAKE 1 BY MOUTH TWO TIMES  DAILY 180 tablet 0  . lanthanum (FOSRENOL) 500 MG chewable tablet Chew 1,000 mg by mouth 3 (three) times daily with meals. And 1 with snacks    . latanoprost (XALATAN) 0.005 % ophthalmic solution Place 1 drop into both eyes at bedtime.  6  . levothyroxine (SYNTHROID, LEVOTHROID)  137 MCG tablet Take 137 mcg by mouth daily before breakfast.    . Multiple Vitamin (DAILY VITE) TABS Take 1 tablet by mouth daily.     [name] SIMBRINZA 1-0.2 % SUSP INSTILL 1 DROP IN INTO BOTH EYES TWICE A DAY  6  . simvastatin (ZOCOR) 20 MG tablet Take 20 mg by mouth daily with supper.      [name] UNABLE TO FIND Take 1 tablet by mouth. Dailyvite 800 mg     No current facility-administered medications for this visit.     Review of Systems  Constitutional: Negative for chills, diaphoresis, fever, malaise/fatigue and weight loss.  HENT: Negative for congestion, ear discharge, ear pain, hearing loss, nosebleeds, sore throat and tinnitus.   Eyes: Negative for blurred vision, double vision, photophobia, pain, discharge and redness.  Respiratory: Negative for cough, hemoptysis, sputum production, shortness of breath, wheezing and stridor.   Cardiovascular: Negative for chest pain, palpitations, orthopnea, claudication, leg swelling and PND.  Gastrointestinal: Negative for abdominal pain, blood in stool, constipation, diarrhea, heartburn, melena, nausea and vomiting.  Genitourinary: Negative for dysuria, flank pain, frequency, hematuria and urgency.  Musculoskeletal: Positive for joint pain. Negative for back pain, falls, myalgias and neck pain.  Skin: Negative for itching and rash.  Neurological: Negative for dizziness, tingling, tremors, sensory change, speech change, focal weakness, seizures, loss of consciousness, weakness and headaches.  Endo/Heme/Allergies: Negative for environmental allergies and polydipsia. Does not bruise/bleed easily.  Psychiatric/Behavioral: Negative.    14 point ROS was done and is otherwise as detailed above or in HPI   PHYSICAL EXAMINATION: ECOG PERFORMANCE STATUS: 1 - Symptomatic but completely ambulatory  Vitals:   04/30/16 1416  BP: 97/69  Pulse: (!) 58  Resp: 16  Temp: 97.5 F (36.4 C)   Filed Weights   04/30/16 1416  Weight: 146 lb 9.6 oz (66.5 kg)    Physical Exam  Constitutional: He is oriented to person, place, and time and well-developed, well-nourished, and in no distress.  HENT:  Head: Normocephalic and atraumatic.  Nose: Nose normal.  Mouth/Throat: Oropharynx is clear and moist. No oropharyngeal exudate.  Eyes: Conjunctivae and EOM are normal. Pupils are equal, round, and reactive to light. Right eye exhibits no discharge. Left eye exhibits no discharge. No scleral icterus.  Neck: Normal range of motion. Neck supple. No tracheal deviation present. No thyromegaly present.  Cardiovascular: Normal rate and regular rhythm.  Exam reveals no gallop and no friction rub.   Murmur heard. Pulmonary/Chest: Effort normal and breath sounds normal. He has no wheezes. He  has no rales.  Abdominal: Soft. Bowel sounds are normal. He exhibits no distension and no mass. There is no tenderness. There is no rebound and no guarding.  Musculoskeletal: Normal range of motion. He exhibits no edema.  Fistula LUE  Lymphadenopathy:    He has no cervical adenopathy.  Neurological: He is alert and oriented to person, place, and time. He has normal reflexes. No cranial nerve deficit. Gait normal. Coordination normal.  Skin: Skin is warm and dry. No rash noted.  Psychiatric: Mood, memory, affect and judgment normal.  Nursing note and vitals reviewed.   LABORATORY DATA:  I have reviewed the data as listed No results found for: WBC, HGB, HCT, MCV, PLT CMP  No results found for: NA, K, CL, CO2, GLUCOSE, BUN, CREATININE, CALCIUM, PROT, ALBUMIN, AST, ALT, ALKPHOS, BILITOT, GFRNONAA, GFRAA       RADIOGRAPHIC STUDIES: I have personally reviewed the radiological images as listed and agreed with the findings in the report. No results found.  ASSESSMENT & PLAN:  MGUS End stage renal disease, on hemodialysis  He was last seen at [facility] with Dr. Jacquiline Doe on 03/02/2015. He has ESRD and has been on hemodialysis for many years. BMBX was last performed in  2015. He is due for a Myeloma Survey. He is agreeable to proceed.   He is due for repeat myeloma labs today, last labs from [facility] are reviewed and pulled into the chart as noted above.   He will be apprised of results once available.  Labs and prior notes from [facility] were reviewed with the patient.  We will see him back in 6 months. If stable we can move his visits back out to yearly.    ORDERS PLACED FOR THIS ENCOUNTER: Orders Placed This Encounter  Procedures  . DG Bone Survey Met  . CBC with Differential  . Comprehensive metabolic panel  . Protein electrophoresis, serum  . Immunofixation electrophoresis  . Kappa/lambda light chains  . IgG, IgA, IgM  . CBC with Differential  . Comprehensive metabolic panel  . Immunofixation electrophoresis  . Protein electrophoresis, serum  . Kappa/lambda light chains  . IgG, IgA, IgM   All questions were answered. The patient knows to call the clinic with any problems, questions or concerns. I spent 55 minutes counseling the patient face to face. The total time spent in the appointment was 60 minutes and more than 50% was on counseling and review of test results  This document serves as a record of services personally performed by Ancil Linsey, MD. It was created on her behalf by Arlyce Harman, a trained medical scribe. The creation of this record is based on the scribe's personal observations and the provider's statements to them. This document has been checked and approved by the attending provider.  I have reviewed the above documentation for accuracy and completeness and I agree with the above.  This note was electronically signed.    Molli Hazard, MD  04/30/2016 3:15 PM

## 2016-05-01 DIAGNOSIS — D509 Iron deficiency anemia, unspecified: Secondary | ICD-10-CM | POA: Diagnosis not present

## 2016-05-01 DIAGNOSIS — N2581 Secondary hyperparathyroidism of renal origin: Secondary | ICD-10-CM | POA: Diagnosis not present

## 2016-05-01 DIAGNOSIS — D631 Anemia in chronic kidney disease: Secondary | ICD-10-CM | POA: Diagnosis not present

## 2016-05-01 DIAGNOSIS — N186 End stage renal disease: Secondary | ICD-10-CM | POA: Diagnosis not present

## 2016-05-01 DIAGNOSIS — Z23 Encounter for immunization: Secondary | ICD-10-CM | POA: Diagnosis not present

## 2016-05-01 DIAGNOSIS — D472 Monoclonal gammopathy: Secondary | ICD-10-CM | POA: Diagnosis not present

## 2016-05-01 LAB — PROTEIN ELECTROPHORESIS, SERUM
A/G Ratio: 1.1 (ref 0.7–1.7)
ALPHA-1-GLOBULIN: 0.2 g/dL (ref 0.0–0.4)
ALPHA-2-GLOBULIN: 0.7 g/dL (ref 0.4–1.0)
Albumin ELP: 4.5 g/dL — ABNORMAL HIGH (ref 2.9–4.4)
Beta Globulin: 0.9 g/dL (ref 0.7–1.3)
GAMMA GLOBULIN: 2.3 g/dL — AB (ref 0.4–1.8)
Globulin, Total: 4.2 g/dL — ABNORMAL HIGH (ref 2.2–3.9)
M-SPIKE, %: 0.4 g/dL — AB
TOTAL PROTEIN ELP: 8.7 g/dL — AB (ref 6.0–8.5)

## 2016-05-01 LAB — IGG, IGA, IGM
IGA: 274 mg/dL (ref 61–437)
IGM, SERUM: 172 mg/dL (ref 20–172)
IgG (Immunoglobin G), Serum: 2514 mg/dL — ABNORMAL HIGH (ref 700–1600)

## 2016-05-01 LAB — KAPPA/LAMBDA LIGHT CHAINS
Kappa free light chain: 325.7 mg/L — ABNORMAL HIGH (ref 3.3–19.4)
Kappa, lambda light chain ratio: 1.28 (ref 0.26–1.65)
Lambda free light chains: 253.8 mg/L — ABNORMAL HIGH (ref 5.7–26.3)

## 2016-05-03 DIAGNOSIS — D631 Anemia in chronic kidney disease: Secondary | ICD-10-CM | POA: Diagnosis not present

## 2016-05-03 DIAGNOSIS — D509 Iron deficiency anemia, unspecified: Secondary | ICD-10-CM | POA: Diagnosis not present

## 2016-05-03 DIAGNOSIS — N2581 Secondary hyperparathyroidism of renal origin: Secondary | ICD-10-CM | POA: Diagnosis not present

## 2016-05-03 DIAGNOSIS — D472 Monoclonal gammopathy: Secondary | ICD-10-CM | POA: Diagnosis not present

## 2016-05-03 DIAGNOSIS — Z23 Encounter for immunization: Secondary | ICD-10-CM | POA: Diagnosis not present

## 2016-05-03 DIAGNOSIS — N186 End stage renal disease: Secondary | ICD-10-CM | POA: Diagnosis not present

## 2016-05-03 LAB — IMMUNOFIXATION ELECTROPHORESIS
IGG (IMMUNOGLOBIN G), SERUM: 2480 mg/dL — AB (ref 700–1600)
IgA: 272 mg/dL (ref 61–437)
IgM, Serum: 174 mg/dL — ABNORMAL HIGH (ref 20–172)
TOTAL PROTEIN ELP: 8.7 g/dL — AB (ref 6.0–8.5)

## 2016-05-04 DIAGNOSIS — N186 End stage renal disease: Secondary | ICD-10-CM | POA: Diagnosis not present

## 2016-05-04 DIAGNOSIS — Z992 Dependence on renal dialysis: Secondary | ICD-10-CM | POA: Diagnosis not present

## 2016-05-06 DIAGNOSIS — D472 Monoclonal gammopathy: Secondary | ICD-10-CM | POA: Diagnosis not present

## 2016-05-06 DIAGNOSIS — N186 End stage renal disease: Secondary | ICD-10-CM | POA: Diagnosis not present

## 2016-05-06 DIAGNOSIS — Z992 Dependence on renal dialysis: Secondary | ICD-10-CM | POA: Diagnosis not present

## 2016-05-06 DIAGNOSIS — D631 Anemia in chronic kidney disease: Secondary | ICD-10-CM | POA: Diagnosis not present

## 2016-05-06 DIAGNOSIS — N2581 Secondary hyperparathyroidism of renal origin: Secondary | ICD-10-CM | POA: Diagnosis not present

## 2016-05-08 DIAGNOSIS — Z992 Dependence on renal dialysis: Secondary | ICD-10-CM | POA: Diagnosis not present

## 2016-05-08 DIAGNOSIS — N2581 Secondary hyperparathyroidism of renal origin: Secondary | ICD-10-CM | POA: Diagnosis not present

## 2016-05-08 DIAGNOSIS — D631 Anemia in chronic kidney disease: Secondary | ICD-10-CM | POA: Diagnosis not present

## 2016-05-08 DIAGNOSIS — N186 End stage renal disease: Secondary | ICD-10-CM | POA: Diagnosis not present

## 2016-05-08 DIAGNOSIS — D472 Monoclonal gammopathy: Secondary | ICD-10-CM | POA: Diagnosis not present

## 2016-05-09 ENCOUNTER — Ambulatory Visit (HOSPITAL_COMMUNITY)
Admission: RE | Admit: 2016-05-09 | Discharge: 2016-05-09 | Disposition: A | Discharge status: Home / Self Care | Payer: Medicare Other | Source: Ambulatory Visit | Attending: Hematology & Oncology | Admitting: Hematology & Oncology

## 2016-05-09 DIAGNOSIS — Z940 Kidney transplant status: Secondary | ICD-10-CM | POA: Diagnosis not present

## 2016-05-09 DIAGNOSIS — D472 Monoclonal gammopathy: Secondary | ICD-10-CM | POA: Diagnosis not present

## 2016-05-09 DIAGNOSIS — I6529 Occlusion and stenosis of unspecified carotid artery: Secondary | ICD-10-CM | POA: Diagnosis not present

## 2016-05-09 DIAGNOSIS — M899 Disorder of bone, unspecified: Secondary | ICD-10-CM | POA: Insufficient documentation

## 2016-05-09 DIAGNOSIS — Z992 Dependence on renal dialysis: Secondary | ICD-10-CM | POA: Insufficient documentation

## 2016-05-10 DIAGNOSIS — N186 End stage renal disease: Secondary | ICD-10-CM | POA: Diagnosis not present

## 2016-05-10 DIAGNOSIS — D631 Anemia in chronic kidney disease: Secondary | ICD-10-CM | POA: Diagnosis not present

## 2016-05-10 DIAGNOSIS — Z992 Dependence on renal dialysis: Secondary | ICD-10-CM | POA: Diagnosis not present

## 2016-05-10 DIAGNOSIS — N2581 Secondary hyperparathyroidism of renal origin: Secondary | ICD-10-CM | POA: Diagnosis not present

## 2016-05-10 DIAGNOSIS — D472 Monoclonal gammopathy: Secondary | ICD-10-CM | POA: Diagnosis not present

## 2016-05-13 DIAGNOSIS — Z992 Dependence on renal dialysis: Secondary | ICD-10-CM | POA: Diagnosis not present

## 2016-05-13 DIAGNOSIS — N2581 Secondary hyperparathyroidism of renal origin: Secondary | ICD-10-CM | POA: Diagnosis not present

## 2016-05-13 DIAGNOSIS — N186 End stage renal disease: Secondary | ICD-10-CM | POA: Diagnosis not present

## 2016-05-13 DIAGNOSIS — D631 Anemia in chronic kidney disease: Secondary | ICD-10-CM | POA: Diagnosis not present

## 2016-05-13 DIAGNOSIS — D472 Monoclonal gammopathy: Secondary | ICD-10-CM | POA: Diagnosis not present

## 2016-05-15 DIAGNOSIS — N2581 Secondary hyperparathyroidism of renal origin: Secondary | ICD-10-CM | POA: Diagnosis not present

## 2016-05-15 DIAGNOSIS — D631 Anemia in chronic kidney disease: Secondary | ICD-10-CM | POA: Diagnosis not present

## 2016-05-15 DIAGNOSIS — N186 End stage renal disease: Secondary | ICD-10-CM | POA: Diagnosis not present

## 2016-05-15 DIAGNOSIS — Z992 Dependence on renal dialysis: Secondary | ICD-10-CM | POA: Diagnosis not present

## 2016-05-15 DIAGNOSIS — D472 Monoclonal gammopathy: Secondary | ICD-10-CM | POA: Diagnosis not present

## 2016-05-17 DIAGNOSIS — N186 End stage renal disease: Secondary | ICD-10-CM | POA: Diagnosis not present

## 2016-05-17 DIAGNOSIS — D631 Anemia in chronic kidney disease: Secondary | ICD-10-CM | POA: Diagnosis not present

## 2016-05-17 DIAGNOSIS — Z992 Dependence on renal dialysis: Secondary | ICD-10-CM | POA: Diagnosis not present

## 2016-05-17 DIAGNOSIS — N2581 Secondary hyperparathyroidism of renal origin: Secondary | ICD-10-CM | POA: Diagnosis not present

## 2016-05-17 DIAGNOSIS — D472 Monoclonal gammopathy: Secondary | ICD-10-CM | POA: Diagnosis not present

## 2016-05-20 DIAGNOSIS — D631 Anemia in chronic kidney disease: Secondary | ICD-10-CM | POA: Diagnosis not present

## 2016-05-20 DIAGNOSIS — D472 Monoclonal gammopathy: Secondary | ICD-10-CM | POA: Diagnosis not present

## 2016-05-20 DIAGNOSIS — Z992 Dependence on renal dialysis: Secondary | ICD-10-CM | POA: Diagnosis not present

## 2016-05-20 DIAGNOSIS — N186 End stage renal disease: Secondary | ICD-10-CM | POA: Diagnosis not present

## 2016-05-20 DIAGNOSIS — N2581 Secondary hyperparathyroidism of renal origin: Secondary | ICD-10-CM | POA: Diagnosis not present

## 2016-05-21 DIAGNOSIS — H401133 Primary open-angle glaucoma, bilateral, severe stage: Secondary | ICD-10-CM | POA: Diagnosis not present

## 2016-05-22 DIAGNOSIS — D631 Anemia in chronic kidney disease: Secondary | ICD-10-CM | POA: Diagnosis not present

## 2016-05-22 DIAGNOSIS — N2581 Secondary hyperparathyroidism of renal origin: Secondary | ICD-10-CM | POA: Diagnosis not present

## 2016-05-22 DIAGNOSIS — Z992 Dependence on renal dialysis: Secondary | ICD-10-CM | POA: Diagnosis not present

## 2016-05-22 DIAGNOSIS — N186 End stage renal disease: Secondary | ICD-10-CM | POA: Diagnosis not present

## 2016-05-22 DIAGNOSIS — D472 Monoclonal gammopathy: Secondary | ICD-10-CM | POA: Diagnosis not present

## 2016-05-24 DIAGNOSIS — N186 End stage renal disease: Secondary | ICD-10-CM | POA: Diagnosis not present

## 2016-05-24 DIAGNOSIS — Z992 Dependence on renal dialysis: Secondary | ICD-10-CM | POA: Diagnosis not present

## 2016-05-24 DIAGNOSIS — D631 Anemia in chronic kidney disease: Secondary | ICD-10-CM | POA: Diagnosis not present

## 2016-05-24 DIAGNOSIS — N2581 Secondary hyperparathyroidism of renal origin: Secondary | ICD-10-CM | POA: Diagnosis not present

## 2016-05-24 DIAGNOSIS — D472 Monoclonal gammopathy: Secondary | ICD-10-CM | POA: Diagnosis not present

## 2016-05-27 DIAGNOSIS — N2581 Secondary hyperparathyroidism of renal origin: Secondary | ICD-10-CM | POA: Diagnosis not present

## 2016-05-27 DIAGNOSIS — Z992 Dependence on renal dialysis: Secondary | ICD-10-CM | POA: Diagnosis not present

## 2016-05-27 DIAGNOSIS — N186 End stage renal disease: Secondary | ICD-10-CM | POA: Diagnosis not present

## 2016-05-27 DIAGNOSIS — D631 Anemia in chronic kidney disease: Secondary | ICD-10-CM | POA: Diagnosis not present

## 2016-05-27 DIAGNOSIS — D472 Monoclonal gammopathy: Secondary | ICD-10-CM | POA: Diagnosis not present

## 2016-05-29 DIAGNOSIS — N2581 Secondary hyperparathyroidism of renal origin: Secondary | ICD-10-CM | POA: Diagnosis not present

## 2016-05-29 DIAGNOSIS — Z992 Dependence on renal dialysis: Secondary | ICD-10-CM | POA: Diagnosis not present

## 2016-05-29 DIAGNOSIS — D631 Anemia in chronic kidney disease: Secondary | ICD-10-CM | POA: Diagnosis not present

## 2016-05-29 DIAGNOSIS — D472 Monoclonal gammopathy: Secondary | ICD-10-CM | POA: Diagnosis not present

## 2016-05-29 DIAGNOSIS — I10 Essential (primary) hypertension: Secondary | ICD-10-CM | POA: Diagnosis not present

## 2016-05-29 DIAGNOSIS — N186 End stage renal disease: Secondary | ICD-10-CM | POA: Diagnosis not present

## 2016-05-29 DIAGNOSIS — E7800 Pure hypercholesterolemia, unspecified: Secondary | ICD-10-CM | POA: Diagnosis not present

## 2016-05-30 ENCOUNTER — Encounter (HOSPITAL_COMMUNITY): Payer: Self-pay | Admitting: Oncology

## 2016-05-30 ENCOUNTER — Encounter (HOSPITAL_COMMUNITY): Payer: Medicare Other | Attending: Hematology & Oncology | Admitting: Oncology

## 2016-05-30 VITALS — BP 102/57 | HR 120 | Temp 97.6°F | Resp 18 | Wt 146.1 lb

## 2016-05-30 DIAGNOSIS — Z992 Dependence on renal dialysis: Secondary | ICD-10-CM

## 2016-05-30 DIAGNOSIS — D472 Monoclonal gammopathy: Secondary | ICD-10-CM | POA: Insufficient documentation

## 2016-05-30 DIAGNOSIS — M899 Disorder of bone, unspecified: Secondary | ICD-10-CM

## 2016-05-30 NOTE — Assessment & Plan Note (Addendum)
"  MGUS" having been followed by Dr. Jacquiline Doe for years, BUT, skeletal survey performed on  05/09/2016 demonstrates multiple lytic lesions with expansile lyitc lesion at L4 pedicle.  Labs from 04/30/2016 reviewed: CBC diff, CMET, SPEP+IFE, light chain assay, and IgG, IgA, IgM.  I personally reviewed and went over laboratory results with the patient.  The results are noted within this dictation.  I personally reviewed and went over radiographic studies with the patient.  The results are noted within this dictation.  Skeletal survey on 05/09/2016 demonstrates significant lytic lesions.  I reviewed the definition or criteria for the diagnosis of multiple myeloma: The International Myeloma Working Group criteria for the diagnosis of MM emphasize the importance of end organ damage in making the diagnosis.  The diagnosis of MM requires the fulfillment of the following criterion: ?Clonal bone marrow plasma cells ?10 percent or biopsy-proven bony or soft tissue plasmacytoma - Clonality should be established by showing a kappa/lambda light-chain restriction on flow cytometry, immunohistochemistry, or immunofluorescence. Bone marrow plasma cell percentage should be estimated from a core biopsy specimen, when possible. If there is disparity between the aspirate and core biopsy, the highest value should be used. Approximately 4 percent of patients may have fewer than 10 percent bone marrow plasma cells since marrow involvement may be focal, rather than diffuse. Repeat bone marrow biopsy should be considered in such patients. PLUS one of the following: ?Presence of related organ or tissue impairment (often recalled by the acronym CRAB) - End organ damage is suggested by increased plasma calcium level, renal insufficiency, anemia, and bone lesions. In order to be included as diagnostic criteria, changes in these factors must be felt to be related to the underlying plasma cell proliferative disorder. For these purposes, the  following definitions are used:  .Anemia - Hemoglobin <10 g/dL (<100 g/L) or >2 g/dL (>20 g/L) below normal  .Hypercalcemia - Serum calcium >11 mg/dL (>2.75 mmol/liter). Consider other causes of hypercalcemia (eg, hyperparathyroidism).   .Renal insufficiency - Estimated or measured creatine clearance <40 mL/min or serum creatinine >2 mg/dL (177 mol/liter). Of these, creatinine clearance is the preferred measure of renal insufficiency because normal serum creatinine levels vary by age, sex, and race. Using creatinine clearance ensures that a similar level of renal dysfunction is required to end organ damage.  .Bone lesions - One or more osteolytic lesions ?5 mm in size on skeletal radiography, MRI, CT, or PET/CT. In the absence of osteolytic lesions, the following are not sufficient markers of bone lesions: increased FDG uptake on PET, osteoporosis, or vertebral compression fracture. When a diagnosis is in doubt, biopsy of the bone lesion should be considered.  With this definition in mind, it is recommended that we pursue a bone biopsy of L4 pedicle lesion for diagnosis and repeat bone marrow aspiration and biopsy.  I have messaged Dr. Geroge Baseman to see if both of these can be done on the same day in IR given his transportation issues.  Based upon results, he will need bone targeted therapy and likely therapy for multiple myeloma.  Return in ~ 3 weeks for follow-up to review pathology results.

## 2016-05-30 NOTE — Patient Instructions (Signed)
[place] at [facility] Discharge Instructions  RECOMMENDATIONS MADE BY THE CONSULTANT AND ANY TEST RESULTS WILL BE SENT TO YOUR REFERRING PHYSICIAN.  Bone Marrow Biopsy Return in 3 weeks for follow up  Thank you for choosing [place] at [facility] to provide your oncology and hematology care.  To afford each patient quality time with our provider, please arrive at least 15 minutes before your scheduled appointment time.   Beginning January 23rd 2017 lab work for the [facility] will be done in the  Main lab at [facility] on 1st floor. If you have a lab appointment with the [facility] please come in thru the  Main Entrance and check in at the main information desk  You need to re-schedule your appointment should you arrive 10 or more minutes late.  We strive to give you quality time with our providers, and arriving late affects you and other patients whose appointments are after yours.  Also, if you no show three or more times for appointments you may be dismissed from the clinic at the providers discretion.     Again, thank you for choosing [facility].  Our hope is that these requests will decrease the amount of time that you wait before being seen by our physicians.       _____________________________________________________________  Should you have questions after your visit to [facility], please contact our office at [phone] between the hours of 8:30 a.m. and 4:30 p.m.  Voicemails left after 4:30 p.m. will not be returned until the following business day.  For prescription refill requests, have your pharmacy contact our office.         Resources For Cancer Patients and their Caregivers ? American Cancer Society: Can assist with transportation, wigs, general needs, runs Look Good Feel Better.        504 854 7704 ? Cancer Care: Provides financial assistance, online support groups,  medication/co-pay assistance.  1-800-813-HOPE (579) 675-4970) ? [facility] Assists [place] cancer patients and their families through emotional , educational and financial support.  (337)860-6210 ? [place] DSS Where to apply for food stamps, Medicaid and utility assistance. 7083897949 ? [facility]: Transportation to medical appointments. 306 277 4799 ? Social Security Administration: May apply for disability if have a Stage IV cancer. 256-155-1202 780-014-2490 ? [employer], Disability and Transit Services: Assists with nutrition, care and transit needs. [place] Support Programs: _0 @ > Cancer Support Group  2nd Tuesday of the month 1pm-2pm, Journey Room  > Creative Journey  3rd Tuesday of the month 1130am-1pm, Journey Room  > Look Good Feel Better  1st Wednesday of the month 10am-12 noon, Journey Room (Call [facility] to register [phone])

## 2016-05-30 NOTE — Progress Notes (Signed)
Brian Chroman, MD Brian Liu [place] [address]  MGUS (monoclonal gammopathy of unknown significance) - Plan: minoxidil (LONITEN) 2.5 MG tablet, CT Biopsy  Lytic lesion of bone on x-ray - Plan: CT Biopsy  CURRENT THERAPY: Work-up for lytic lesions  INTERVAL HISTORY: Brian Liu 65 y.o. male returns for followup of "MGUS" having been followed by Dr. Jacquiline Liu for years, BUT, skeletal survey performed on  05/09/2016 demonstrates multiple lytic lesions with expansile lyitc lesion at L4 pedicle.  He is feeling good.  He continues on dialysis on M-W-F.  He denies any new aches or pains.  His appetite is stable and his weight is stable.  Review of Systems  Constitutional: Negative.  Negative for chills, fever and weight loss.  HENT: Negative.   Eyes: Negative.   Respiratory: Negative.  Negative for cough.   Cardiovascular: Negative.  Negative for chest pain.  Gastrointestinal: Negative.   Genitourinary: Negative.   Musculoskeletal: Negative.  Negative for back pain and joint pain.  Skin: Negative.   Neurological: Negative.  Negative for weakness and headaches.  Endo/Heme/Allergies: Negative.   Psychiatric/Behavioral: Negative.     Past Medical History:  Diagnosis Date  . Atrial fibrillation (HCC)    Permanent, Not on Coumadin because of history of lower GI bleed  . Diastolic dysfunction    Restrictive  diastolic filling pattern echo,  February, 2013  . Dyslipidemia   . Ejection fraction    EF 60-65%, echo, 2013  . ESRD (end stage renal disease) ([place])    Dialysis, fistula in left arm  . Hypertension   . Lower gastrointestinal bleed   . LVH (left ventricular hypertrophy)   . 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 Mitral regurgitation    Mild by echo, 2013  . Pancytopenia ([place])    Dr Brian Liu  . Pulmonary hypertension    Severe, PA pressure 65-70 mm of mercury, echo, 2013  . Tobacco abuse     Past Surgical History:  Procedure  Laterality Date  . AV FISTULA PLACEMENT Left 1992   Left arm   . 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 Other Mother     Deceased, 81  . Stomach cancer Father     Deceased, 45  . Kidney failure Son   . Healthy Son     Social History   Social History  . Marital status: Widowed    Spouse name: N/A  . Number of children: N/A  . Years of education: N/A   Social History Main Topics  . Smoking status: Never Smoker  . Smokeless tobacco: Never Used  . Alcohol use No  . Drug use: No  . Sexual activity: Not Asked   Other Topics Concern  . None   Social History Narrative   Lives alone in a one story home.  Has 2 sons.     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     Education high school.      PHYSICAL EXAMINATION  ECOG PERFORMANCE STATUS: 1 - Symptomatic but completely ambulatory  Vitals:   05/30/16 1400  BP: (!) 102/57  Pulse: (!) 120  Resp: 18  Temp: 97.6 F (36.4 C)    GENERAL:alert, no distress, well nourished, well developed, comfortable, cooperative, smiling and unaccompanied SKIN: skin color, texture, turgor  are normal, no rashes or significant lesions HEAD: Normocephalic, No masses, lesions, tenderness or abnormalities EYES: normal, EOMI, Conjunctiva are pink and non-injected EARS: External ears normal OROPHARYNX:lips, buccal mucosa, and tongue normal and mucous membranes are moist  NECK: supple, trachea midline LYMPH:  no palpable lymphadenopathy BREAST:not examined LUNGS: clear to auscultation and percussion HEART: regular rate & rhythm, no murmurs and no gallops ABDOMEN:abdomen soft BACK: Back symmetric, no curvature. EXTREMITIES:less then 2 second capillary refill, no joint deformities, effusion, or inflammation, no skin discoloration  NEURO: alert & oriented x 3 with fluent speech, no focal motor/sensory  deficits   LABORATORY DATA: CBC    Component Value Date/Time   WBC 5.1 04/30/2016 1513   RBC 4.42 04/30/2016 1513   HGB 11.3 (L) 04/30/2016 1513   HCT 34.7 (L) 04/30/2016 1513   PLT 120 (L) 04/30/2016 1513   MCV 78.5 04/30/2016 1513   MCH 25.6 (L) 04/30/2016 1513   MCHC 32.6 04/30/2016 1513   RDW 14.1 04/30/2016 1513   LYMPHSABS 2.0 04/30/2016 1513   MONOABS 0.5 04/30/2016 1513   EOSABS 0.2 04/30/2016 1513   BASOSABS 0.0 04/30/2016 1513      Chemistry      Component Value Date/Time   NA 136 04/30/2016 1513   K 4.7 04/30/2016 1513   CL 98 (L) 04/30/2016 1513   CO2 29 04/30/2016 1513   BUN 28 (H) 04/30/2016 1513   CREATININE 7.80 (H) 04/30/2016 1513      Component Value Date/Time   CALCIUM 9.2 04/30/2016 1513   ALKPHOS 49 04/30/2016 1513   AST 18 04/30/2016 1513   ALT 9 (L) 04/30/2016 1513   BILITOT 0.7 04/30/2016 1513        PENDING LABS:   RADIOGRAPHIC STUDIES:  Dg Bone Survey Met  Result Date: 05/09/2016 CLINICAL DATA:  Monoclonal gammopathy. EXAM: METASTATIC BONE SURVEY COMPARISON:  Chest x-ray 08/21/2015.  CT 10/01/2011. FINDINGS: Multiple lytic lesions noted throughout the skull consistent with multiple myeloma. Expansile lytic lesion in the left L4 pedicle. This could represent a plasmocytoma. Other bony tumors including Brown tumor and metastatic disease could present in this fashion. Lytic lesion in the upper right femoral neck is noted. Plasmacytoma could present in this fashion. Other bony tumors including Brown tumor and metastatic disease could present in this fashion. Diffuse severe osteopenia noted. Calcified rejected left renal transplant noted. Carotid vascular calcification. Right shoulder replacement. Left forearm dialysis graft with calcification noted. Severe degenerative changes noted of the lower cervical spine. Diffuse degenerative changes noted throughout the remainder of the spine. IMPRESSION: 1. Multiple lytic lesions noted throughout the  skull consistent with multiple myeloma. 2. Expansile lytic lesion noted within the left L4 pedicle. Lytic lesion noted in the right femoral neck. These could represent plasmocytoma was. Other bony lesions including brown tumors and metastatic disease cannot be excluded. 3. Calcified projected left renal transplant. Left forearm dialysis graft with calcification noted. 4.  Carotid vascular disease. Electronically Signed   By: Marcello Moores  Register   On: 05/09/2016 09:07     PATHOLOGY:    ASSESSMENT AND PLAN:  MGUS (monoclonal gammopathy of unknown significance) "MGUS" having been followed by Dr. Jacquiline Liu for years, BUT, skeletal survey performed on  05/09/2016 demonstrates multiple lytic lesions with expansile lyitc lesion at L4 pedicle.  Labs from 04/30/2016 reviewed: CBC diff, CMET, SPEP+IFE, light chain assay, and IgG, IgA, IgM.  I personally reviewed and went over laboratory results with the patient.  The results are noted within this dictation.  I personally reviewed and went over radiographic studies with the patient.  The results are noted within this dictation.  Skeletal survey on 05/09/2016 demonstrates significant lytic lesions.  I reviewed the definition or criteria for the diagnosis of multiple myeloma: The International Myeloma Working Group criteria for the diagnosis of MM emphasize the importance of end organ damage in making the diagnosis.  The diagnosis of MM requires the fulfillment of the following criterion: ?Clonal bone marrow plasma cells ?10 percent or biopsy-proven bony or soft tissue plasmacytoma - Clonality should be established by showing a kappa/lambda light-chain restriction on flow cytometry, immunohistochemistry, or immunofluorescence. Bone marrow plasma cell percentage should be estimated from a core biopsy specimen, when possible. If there is disparity between the aspirate and core biopsy, the highest value should be used. Approximately 4 percent of patients may have fewer  than 10 percent bone marrow plasma cells since marrow involvement may be focal, rather than diffuse. Repeat bone marrow biopsy should be considered in such patients. PLUS one of the following: ?Presence of related organ or tissue impairment (often recalled by the acronym CRAB) - End organ damage is suggested by increased plasma calcium level, renal insufficiency, anemia, and bone lesions. In order to be included as diagnostic criteria, changes in these factors must be felt to be related to the underlying plasma cell proliferative disorder. For these purposes, the following definitions are used:  .Anemia - Hemoglobin <10 g/dL (<100 g/L) or >2 g/dL (>20 g/L) below normal  .Hypercalcemia - Serum calcium >11 mg/dL (>2.75 mmol/liter). Consider other causes of hypercalcemia (eg, hyperparathyroidism).   .Renal insufficiency - Estimated or measured creatine clearance <40 mL/min or serum creatinine >2 mg/dL (177 mol/liter). Of these, creatinine clearance is the preferred measure of renal insufficiency because normal serum creatinine levels vary by age, sex, and race. Using creatinine clearance ensures that a similar level of renal dysfunction is required to end organ damage.  .Bone lesions - One or more osteolytic lesions ?5 mm in size on skeletal radiography, MRI, CT, or PET/CT. In the absence of osteolytic lesions, the following are not sufficient markers of bone lesions: increased FDG uptake on PET, osteoporosis, or vertebral compression fracture. When a diagnosis is in doubt, biopsy of the bone lesion should be considered.  With this definition in mind, it is recommended that we pursue a bone biopsy of L4 pedicle lesion for diagnosis and repeat bone marrow aspiration and biopsy.  I have messaged Dr. Geroge Baseman to see if both of these can be done on the same day in IR given his transportation issues.  Based upon results, he will need bone targeted therapy and likely therapy for multiple myeloma.  Return in ~  3 weeks for follow-up to review pathology results.   ORDERS PLACED FOR THIS ENCOUNTER: Orders Placed This Encounter  Procedures  . CT Biopsy    MEDICATIONS PRESCRIBED THIS ENCOUNTER: Meds ordered this encounter  Medications  . minoxidil (LONITEN) 2.5 MG tablet    THERAPY PLAN:  Biopsy of L4 pedicle and bone marrow aspiration and biopsy.  All questions were answered. The patient knows to call the clinic with any problems, questions or concerns. We can certainly see the patient much sooner if necessary.  Patient and plan discussed with Dr. Ancil Linsey and she is in agreement with the aforementioned.   This note is electronically signed by: Doy Mince 05/30/2016 4:29 PM

## 2016-05-31 DIAGNOSIS — Z992 Dependence on renal dialysis: Secondary | ICD-10-CM | POA: Diagnosis not present

## 2016-05-31 DIAGNOSIS — D472 Monoclonal gammopathy: Secondary | ICD-10-CM | POA: Diagnosis not present

## 2016-05-31 DIAGNOSIS — N2581 Secondary hyperparathyroidism of renal origin: Secondary | ICD-10-CM | POA: Diagnosis not present

## 2016-05-31 DIAGNOSIS — N186 End stage renal disease: Secondary | ICD-10-CM | POA: Diagnosis not present

## 2016-05-31 DIAGNOSIS — D631 Anemia in chronic kidney disease: Secondary | ICD-10-CM | POA: Diagnosis not present

## 2016-06-03 DIAGNOSIS — Z992 Dependence on renal dialysis: Secondary | ICD-10-CM | POA: Diagnosis not present

## 2016-06-03 DIAGNOSIS — D472 Monoclonal gammopathy: Secondary | ICD-10-CM | POA: Diagnosis not present

## 2016-06-03 DIAGNOSIS — N2581 Secondary hyperparathyroidism of renal origin: Secondary | ICD-10-CM | POA: Diagnosis not present

## 2016-06-03 DIAGNOSIS — D631 Anemia in chronic kidney disease: Secondary | ICD-10-CM | POA: Diagnosis not present

## 2016-06-03 DIAGNOSIS — N186 End stage renal disease: Secondary | ICD-10-CM | POA: Diagnosis not present

## 2016-06-04 ENCOUNTER — Other Ambulatory Visit (HOSPITAL_COMMUNITY): Payer: Self-pay | Admitting: Oncology

## 2016-06-04 DIAGNOSIS — M899 Disorder of bone, unspecified: Secondary | ICD-10-CM

## 2016-06-04 DIAGNOSIS — N186 End stage renal disease: Secondary | ICD-10-CM | POA: Diagnosis not present

## 2016-06-04 DIAGNOSIS — Z992 Dependence on renal dialysis: Secondary | ICD-10-CM | POA: Diagnosis not present

## 2016-06-05 ENCOUNTER — Other Ambulatory Visit (HOSPITAL_COMMUNITY): Payer: Self-pay | Admitting: Oncology

## 2016-06-05 ENCOUNTER — Telehealth (HOSPITAL_COMMUNITY): Payer: Self-pay

## 2016-06-05 DIAGNOSIS — D509 Iron deficiency anemia, unspecified: Secondary | ICD-10-CM | POA: Diagnosis not present

## 2016-06-05 DIAGNOSIS — Z992 Dependence on renal dialysis: Secondary | ICD-10-CM | POA: Diagnosis not present

## 2016-06-05 DIAGNOSIS — N2581 Secondary hyperparathyroidism of renal origin: Secondary | ICD-10-CM | POA: Diagnosis not present

## 2016-06-05 DIAGNOSIS — M899 Disorder of bone, unspecified: Secondary | ICD-10-CM

## 2016-06-05 DIAGNOSIS — M25551 Pain in right hip: Secondary | ICD-10-CM

## 2016-06-05 DIAGNOSIS — D631 Anemia in chronic kidney disease: Secondary | ICD-10-CM | POA: Diagnosis not present

## 2016-06-05 DIAGNOSIS — D472 Monoclonal gammopathy: Secondary | ICD-10-CM | POA: Diagnosis not present

## 2016-06-05 DIAGNOSIS — N186 End stage renal disease: Secondary | ICD-10-CM | POA: Diagnosis not present

## 2016-06-05 NOTE — Telephone Encounter (Signed)
-----  Message from Baird Cancer, PA-C sent at 06/04/2016  5:11 PM EDT ----- Absolutely!  Nursing: Please let patient know that her films were reviewed in more detail by Dr. Geroge Baseman and he is not convinced that her abnormalities are concerning.  Therefore, he recommends further imaging.  Orders are placed.    Amy: Please schedule imaging as ordered.  TK ----- Message ----- From: Jacqulynn Cadet, MD Sent: 06/04/2016   9:24 AM To: Baird Cancer, PA-C  Tom,  Sorry - was off last week.  I'm not convinced the L4 lesion is real on those xrays, I'm suspicious its just degenerative endplate changes projecting over the pedicle.  Similarly, the lytic lesion described in the right femoral neck is in a classic location for a synovial herniation pit which is another benign degenerative finding.    Can you please order a CT of the lumbar spine for the possible L4 pedicle and one of the pelvis to look at that right femur?  If either lesion is real/suspicious on CT then we can target it for biopsy.  Otherwise, I would just do a bone marrow.     Thomas Hoff  ----- Message ----- From: Baird Cancer, PA-C Sent: 05/30/2016   2:57 PM To: Jacqulynn Cadet, MD  Good afternoon,  Patient transfer from Dr. Jacquiline Doe at [facility] with Dx of "MGUS."    Skeletal survey performed (the patient's first) showing multiple lytic lesions, particularly of L4 pedicle.  My request: 1. Biopsy of L4 lytic lesion 2. Bone marrow aspiration and biopsy 3. Both done on the same day due to significant transportation issues. 4. Must be done on a Tues or Thursday due to his hemodialysis on M-W-F.  Am I asking too much?  If lytic lesion is positive for plasmacytoma, then he meets criteria for active multiple myeloma and will need treatment.  Thanks  Kirby Crigler

## 2016-06-05 NOTE — Telephone Encounter (Signed)
Patient is aware 

## 2016-06-06 DIAGNOSIS — J449 Chronic obstructive pulmonary disease, unspecified: Secondary | ICD-10-CM | POA: Diagnosis not present

## 2016-06-06 DIAGNOSIS — Z299 Encounter for prophylactic measures, unspecified: Secondary | ICD-10-CM | POA: Diagnosis not present

## 2016-06-06 DIAGNOSIS — I4891 Unspecified atrial fibrillation: Secondary | ICD-10-CM | POA: Diagnosis not present

## 2016-06-06 DIAGNOSIS — E039 Hypothyroidism, unspecified: Secondary | ICD-10-CM | POA: Diagnosis not present

## 2016-06-06 DIAGNOSIS — N186 End stage renal disease: Secondary | ICD-10-CM | POA: Diagnosis not present

## 2016-06-07 DIAGNOSIS — N186 End stage renal disease: Secondary | ICD-10-CM | POA: Diagnosis not present

## 2016-06-07 DIAGNOSIS — D631 Anemia in chronic kidney disease: Secondary | ICD-10-CM | POA: Diagnosis not present

## 2016-06-07 DIAGNOSIS — D472 Monoclonal gammopathy: Secondary | ICD-10-CM | POA: Diagnosis not present

## 2016-06-07 DIAGNOSIS — N2581 Secondary hyperparathyroidism of renal origin: Secondary | ICD-10-CM | POA: Diagnosis not present

## 2016-06-07 DIAGNOSIS — D509 Iron deficiency anemia, unspecified: Secondary | ICD-10-CM | POA: Diagnosis not present

## 2016-06-07 DIAGNOSIS — Z992 Dependence on renal dialysis: Secondary | ICD-10-CM | POA: Diagnosis not present

## 2016-06-10 ENCOUNTER — Other Ambulatory Visit: Payer: Self-pay | Admitting: Radiology

## 2016-06-10 DIAGNOSIS — D472 Monoclonal gammopathy: Secondary | ICD-10-CM | POA: Diagnosis not present

## 2016-06-10 DIAGNOSIS — D631 Anemia in chronic kidney disease: Secondary | ICD-10-CM | POA: Diagnosis not present

## 2016-06-10 DIAGNOSIS — N2581 Secondary hyperparathyroidism of renal origin: Secondary | ICD-10-CM | POA: Diagnosis not present

## 2016-06-10 DIAGNOSIS — N186 End stage renal disease: Secondary | ICD-10-CM | POA: Diagnosis not present

## 2016-06-10 DIAGNOSIS — Z992 Dependence on renal dialysis: Secondary | ICD-10-CM | POA: Diagnosis not present

## 2016-06-10 DIAGNOSIS — D509 Iron deficiency anemia, unspecified: Secondary | ICD-10-CM | POA: Diagnosis not present

## 2016-06-11 ENCOUNTER — Ambulatory Visit (HOSPITAL_COMMUNITY)
Admission: RE | Admit: 2016-06-11 | Discharge: 2016-06-11 | Disposition: A | Discharge status: Home / Self Care | Payer: Medicare Other | Source: Ambulatory Visit | Attending: Oncology | Admitting: Oncology

## 2016-06-11 ENCOUNTER — Encounter (HOSPITAL_COMMUNITY): Payer: Self-pay

## 2016-06-11 DIAGNOSIS — D649 Anemia, unspecified: Secondary | ICD-10-CM | POA: Diagnosis not present

## 2016-06-11 DIAGNOSIS — D4989 Neoplasm of unspecified behavior of other specified sites: Secondary | ICD-10-CM | POA: Diagnosis not present

## 2016-06-11 DIAGNOSIS — M899 Disorder of bone, unspecified: Secondary | ICD-10-CM | POA: Insufficient documentation

## 2016-06-11 DIAGNOSIS — D472 Monoclonal gammopathy: Secondary | ICD-10-CM | POA: Diagnosis not present

## 2016-06-11 LAB — CBC WITH DIFFERENTIAL/PLATELET
Basophils Absolute: 0 10*3/uL (ref 0.0–0.1)
Basophils Relative: 1 %
EOS ABS: 0.2 10*3/uL (ref 0.0–0.7)
EOS PCT: 4 %
HCT: 36.8 % — ABNORMAL LOW (ref 39.0–52.0)
Hemoglobin: 11.6 g/dL — ABNORMAL LOW (ref 13.0–17.0)
LYMPHS ABS: 1.5 10*3/uL (ref 0.7–4.0)
Lymphocytes Relative: 37 %
MCH: 25.7 pg — AB (ref 26.0–34.0)
MCHC: 31.5 g/dL (ref 30.0–36.0)
MCV: 81.4 fL (ref 78.0–100.0)
MONO ABS: 0.4 10*3/uL (ref 0.1–1.0)
MONOS PCT: 9 %
Neutro Abs: 2 10*3/uL (ref 1.7–7.7)
Neutrophils Relative %: 50 %
PLATELETS: 141 10*3/uL — AB (ref 150–400)
RBC: 4.52 MIL/uL (ref 4.22–5.81)
RDW: 16.5 % — ABNORMAL HIGH (ref 11.5–15.5)
WBC: 4.1 10*3/uL (ref 4.0–10.5)

## 2016-06-11 LAB — BASIC METABOLIC PANEL
Anion gap: 12 (ref 5–15)
BUN: 26 mg/dL — ABNORMAL HIGH (ref 6–20)
CALCIUM: 8.8 mg/dL — AB (ref 8.9–10.3)
CHLORIDE: 99 mmol/L — AB (ref 101–111)
CO2: 29 mmol/L (ref 22–32)
CREATININE: 7.41 mg/dL — AB (ref 0.61–1.24)
GFR calc Af Amer: 8 mL/min — ABNORMAL LOW (ref >60)
GFR calc non Af Amer: 7 mL/min — ABNORMAL LOW (ref >60)
GLUCOSE: 86 mg/dL (ref 65–99)
Potassium: 4.9 mmol/L (ref 3.5–5.1)
Sodium: 140 mmol/L (ref 135–145)

## 2016-06-11 LAB — PROTIME-INR
INR: 1.11
PROTHROMBIN TIME: 14.3 s (ref 11.4–15.2)

## 2016-06-11 LAB — BONE MARROW EXAM

## 2016-06-11 MED ORDER — MIDAZOLAM HCL 2 MG/2ML IJ SOLN
INTRAMUSCULAR | Status: AC
  Filled 2016-06-11: qty 6

## 2016-06-11 MED ORDER — SODIUM CHLORIDE 0.9 % IV SOLN
INTRAVENOUS | Status: DC
  Administered 2016-06-11: 07:00:00 via INTRAVENOUS

## 2016-06-11 MED ORDER — MIDAZOLAM HCL 2 MG/2ML IJ SOLN
INTRAMUSCULAR | Status: AC | PRN
  Administered 2016-06-11 (×2): 1 mg via INTRAVENOUS

## 2016-06-11 MED ORDER — FENTANYL CITRATE (PF) 100 MCG/2ML IJ SOLN
INTRAMUSCULAR | Status: AC
  Filled 2016-06-11: qty 4

## 2016-06-11 MED ORDER — FENTANYL CITRATE (PF) 100 MCG/2ML IJ SOLN
INTRAMUSCULAR | Status: AC | PRN
  Administered 2016-06-11: 50 ug via INTRAVENOUS

## 2016-06-11 NOTE — Procedures (Signed)
CT-guided  R iliac bone marrow aspiration and core biopsy No complication No blood loss. See complete dictation in [facility] PACS  

## 2016-06-11 NOTE — H&P (Signed)
Referring Physician(s): Robynn Pane S/Penland,S  Supervising Physician: Arne Cleveland  Patient Status: [facility] OP  Chief Complaint:  "I'm here for a biopsy"  Subjective: Patient familiar to IR service from prior left arm shuntogram/venous angioplasty in 2011. He has a history of end-stage renal disease as well as MGUS with recent skeletal survey demonstrating multiple lytic lesions with expansile lesion at L4 pedicle. He presents today for CT-guided bone marrow biopsy as well as possible biopsy of L4 lesion. He is currently asymptomatic. Past Medical History:  Diagnosis Date  . Atrial fibrillation (HCC)    Permanent, Not on Coumadin because of history of lower GI bleed  . Diastolic dysfunction    Restrictive  diastolic filling pattern echo,  February, 2013  . Dyslipidemia   . Ejection fraction    EF 60-65%, echo, 2013  . ESRD (end stage renal disease) ([place])    Dialysis, fistula in left arm  . Hypertension   . Lower gastrointestinal bleed   . LVH (left ventricular hypertrophy)   . 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 Mitral regurgitation    Mild by echo, 2013  . Pancytopenia ([place])    Dr Ignacia Bayley  . Pulmonary hypertension    Severe, PA pressure 65-70 mm of mercury, echo, 2013  . Tobacco abuse    Past Surgical History:  Procedure Laterality Date  . AV FISTULA PLACEMENT Left 1992   Left arm   . 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  Medications: Prior to Admission medications   Medication Sig Start Date End Date Taking? Authorizing Provider  AZOPT 1 % ophthalmic suspension  05/22/14  Yes Historical Provider, MD  bimatoprost (LUMIGAN) 0.03 % ophthalmic solution 1 drop.   Yes Historical Provider, MD  cinacalcet (SENSIPAR) 30 MG tablet Take 30 mg by mouth.   Yes Historical Provider, MD  folic acid (FOLVITE) 1 MG tablet Take 1 mg by mouth daily.    Yes Historical Provider, MD  gabapentin (NEURONTIN) 300 MG capsule Take 1 capsule (300 mg total) by mouth 3 (three) times daily. 01/19/15  Yes Donika K Patel, DO  labetalol (NORMODYNE) 100 MG tablet TAKE 1 BY MOUTH TWO TIMES  DAILY 07/26/15  Yes Carlena Bjornstad, MD  lanthanum (FOSRENOL) 500 MG chewable tablet Chew 1,000 mg by mouth 3 (three) times daily with meals. And 1 with snacks   Yes Historical Provider, MD  latanoprost (XALATAN) 0.005 % ophthalmic solution Place 1 drop into both eyes at bedtime. 07/13/14  Yes Historical Provider, MD  levothyroxine (SYNTHROID, LEVOTHROID) 137 MCG tablet Take 137 mcg by mouth daily before breakfast.   Yes Historical Provider, MD  minoxidil (LONITEN) 2.5 MG tablet  05/13/16  Yes Historical Provider, MD  Multiple Vitamin (DAILY VITE) TABS Take 1 tablet by mouth daily.    Yes Historical Provider, MD  SIMBRINZA 1-0.2 % SUSP INSTILL 1 DROP IN INTO BOTH EYES TWICE A DAY 01/03/15  Yes Historical Provider, MD  simvastatin (ZOCOR) 20 MG tablet Take 20 mg by mouth daily with supper.     Yes Historical Provider, MD  UNABLE TO FIND Take 1 tablet by mouth. Dailyvite 800 mg   Yes Historical Provider, MD     Vital Signs: BP 114/81   Pulse 78   Temp 97.5 F (36.4 C) (Oral)   Resp 18   Ht _0  (1.854 m)   Wt 146 lb  1.6 oz (66.3 kg)   SpO2 100%   BMI 19.28 kg/m   Physical Exam patient awake, alert. Chest clear to auscultation bilaterally. Heart with irregular rhythm, normal rate,murmur noted. Soft, positive bowel sounds, nontender. Lower extremities with no edema. Left upper extremity with functioning AV fistula.  Imaging: No results found.  Labs:  CBC:  Recent Labs  04/30/16 1513 06/11/16 0644  WBC 5.1 4.1  HGB 11.3* 11.6*  HCT 34.7* 36.8*  PLT 120* 141*    COAGS:  Recent Labs  06/11/16 0644  INR 1.11    BMP:  Recent Labs  04/30/16 1513 06/11/16 0706  NA 136 140  K 4.7 4.9  CL 98* 99*  CO2 29 29  GLUCOSE 93 86  BUN 28* 26*  CALCIUM 9.2  8.8*  CREATININE 7.80* 7.41*  GFRNONAA 6* 7*  GFRAA 8* 8*    LIVER FUNCTION TESTS:  Recent Labs  04/30/16 1513  BILITOT 0.7  AST 18  ALT 9*  ALKPHOS 49  PROT 9.1*  ALBUMIN 4.4    Assessment and Plan: Pt with history of end-stage renal disease as well as MGUS with recent skeletal survey demonstrating multiple lytic lesions with expansile lesion at L4 pedicle. He presents today for CT-guided bone marrow biopsy as well as possible biopsy of L4 lesion.Risks and benefits discussed with the patient/son including, but not limited to bleeding, infection, damage to adjacent structures or low yield requiring additional tests.All of the patient's questions were answered, patient is agreeable to proceed.Consent signed and in chart.     Electronically Signed: D. Rowe Robert 06/11/2016, 8:31 AM   I spent a total of 20 minutes at the the patient's bedside AND on the patient's hospital floor or unit, greater than 50% of which was counseling/coordinating care for CT-guided bone marrow/possible L4 lesion biopsy

## 2016-06-11 NOTE — Discharge Instructions (Signed)
Bone Marrow Aspiration and Bone Marrow Biopsy °Bone marrow aspiration and bone marrow biopsy are procedures that are done to diagnose blood disorders. You may also have one of these procedures to help diagnose infections or some types of cancer. °Bone marrow is the soft tissue that is inside your bones. Blood cells are produced in bone marrow. For bone marrow aspiration, a sample of tissue in liquid form is removed from inside your bone. For a bone marrow biopsy, a small core of bone marrow tissue is removed. Then these samples are examined under a microscope or tested in a lab. °You may need these procedures if you have an abnormal complete blood count (CBC). The aspiration or biopsy sample is usually taken from the top of your hip bone. Sometimes, an aspiration sample is taken from your chest bone (sternum). °LET YOUR HEALTH CARE PROVIDER KNOW ABOUT: °· Any allergies you have. °· All medicines you are taking, including vitamins, herbs, eye drops, creams, and over-the-counter medicines. °· Previous problems you or members of your family have had with the use of anesthetics. °· Any blood disorders you have. °· Previous surgeries you have had. °· Any medical conditions you may have. °· Whether you are pregnant or you think that you may be pregnant. °RISKS AND COMPLICATIONS °Generally, this is a safe procedure. However, problems may occur, including: °· Infection. °· Bleeding. °BEFORE THE PROCEDURE °· Ask your health care provider about: °¨ Changing or stopping your regular medicines. This is especially important if you are taking diabetes medicines or blood thinners. °¨ Taking medicines such as aspirin and ibuprofen. These medicines can thin your blood. Do not take these medicines before your procedure if your health care provider instructs you not to. °· Plan to have someone take you home after the procedure. °· If you go home right after the procedure, plan to have someone with you for 24 hours. °PROCEDURE  °· An  IV tube may be inserted into one of your veins. °· The injection site will be cleaned with a germ-killing solution (antiseptic). °· You will be given one or more of the following: °¨ A medicine that helps you relax (sedative). °¨ A medicine that numbs the area (local anesthetic). °· The bone marrow sample will be removed as follows: °¨ For an aspiration, a hollow needle will be inserted through your skin and into your bone. Bone marrow fluid will be drawn up into a syringe. °¨ For a biopsy, your health care provider will use a hollow needle to remove a core of tissue from your bone marrow. °· The needle will be removed. °· A bandage (dressing) will be placed over the insertion site and taped in place. °The procedure may vary among health care providers and hospitals. °AFTER THE PROCEDURE °· Your blood pressure, heart rate, breathing rate, and blood oxygen level will be monitored often until the medicines you were given have worn off. °· Return to your normal activities as directed by your health care provider. °  °This information is not intended to replace advice given to you by your health care provider. Make sure you discuss any questions you have with your health care provider. °  °Document Released: 07/25/2004 Document Revised: 12/06/2014 Document Reviewed: 07/13/2014 °Elsevier Interactive Patient Education ©2016 Elsevier Inc. °Bone Marrow Aspiration and Bone Marrow Biopsy, Care After °Refer to this sheet in the next few weeks. These instructions provide you with information about caring for yourself after your procedure. Your health care provider may also give you   more specific instructions. Your treatment has been planned according to current medical practices, but problems sometimes occur. Call your health care provider if you have any problems or questions after your procedure. WHAT TO EXPECT AFTER THE PROCEDURE After your procedure, it is common to have:  Soreness or tenderness around the puncture  site.  Bruising. HOME CARE INSTRUCTIONS  Take medicines only as directed by your health care provider.  Follow your health care provider's instructions about:  Puncture site care.  Bandage (dressing) changes and removal.  Bathe and shower as directed by your health care provider.  Check your puncture site every day for signs of infection. Watch for:  Redness, swelling, or pain.  Fluid, blood, or pus.  Return to your normal activities as directed by your health care provider.  Keep all follow-up visits as directed by your health care provider. This is important. SEEK MEDICAL CARE IF:  You have a fever.  You have uncontrollable bleeding.  You have redness, swelling, or pain at the site of your puncture.  You have fluid, blood, or pus coming from your puncture site.   This information is not intended to replace advice given to you by your health care provider. Make sure you discuss any questions you have with your health care provider.   Document Released: 02/08/2005 Document Revised: 12/06/2014 Document Reviewed: 07/13/2014 Elsevier Interactive Patient Education 2016 Elsevier Inc. Moderate Conscious Sedation, Adult Sedation is the use of medicines to promote relaxation and relieve discomfort and anxiety. Moderate conscious sedation is a type of sedation. Under moderate conscious sedation you are less alert than normal but are still able to respond to instructions or stimulation. Moderate conscious sedation is used during short medical and dental procedures. It is milder than deep sedation or general anesthesia and allows you to return to your regular activities sooner. LET [facility] CARE PROVIDER KNOW ABOUT:   Any allergies you have.  All medicines you are taking, including vitamins, herbs, eye drops, creams, and over-the-counter medicines.  Use of steroids (by mouth or creams).  Previous problems you or members of your family have had with the use of  anesthetics.  Any blood disorders you have.  Previous surgeries you have had.  Medical conditions you have.  Possibility of pregnancy, if this applies.  Use of cigarettes, alcohol, or illegal drugs. RISKS AND COMPLICATIONS Generally, this is a safe procedure. However, as with any procedure, problems can occur. Possible problems include:  Oversedation.  Trouble breathing on your own. You may need to have a breathing tube until you are awake and breathing on your own.  Allergic reaction to any of the medicines used for the procedure. BEFORE THE PROCEDURE  You may have blood tests done. These tests can help show how well your kidneys and liver are working. They can also show how well your blood clots.  A physical exam will be done.  Only take medicines as directed by your health care provider. You may need to stop taking medicines (such as blood thinners, aspirin, or nonsteroidal anti-inflammatory drugs) before the procedure.   Do not eat or drink at least 6 hours before the procedure or as directed by your health care provider.  Arrange for a responsible adult, family member, or friend to take you home after the procedure. He or she should stay with you for at least 24 hours after the procedure, until the medicine has worn off. PROCEDURE   An intravenous (IV) catheter will be inserted into one of your  veins. Medicine will be able to flow directly into your body through this catheter. You may be given medicine through this tube to help prevent pain and help you relax.  The medical or dental procedure will be done. AFTER THE PROCEDURE  You will stay in a recovery area until the medicine has worn off. Your blood pressure and pulse will be checked.   Depending on the procedure you had, you may be allowed to go home when you can tolerate liquids and your pain is under control.   This information is not intended to replace advice given to you by your health care provider. Make  sure you discuss any questions you have with your health care provider.   Document Released: 04/16/2001 Document Revised: 08/12/2014 Document Reviewed: 03/29/2013 Elsevier Interactive Patient Education [employer].

## 2016-06-12 DIAGNOSIS — D472 Monoclonal gammopathy: Secondary | ICD-10-CM | POA: Diagnosis not present

## 2016-06-12 DIAGNOSIS — D509 Iron deficiency anemia, unspecified: Secondary | ICD-10-CM | POA: Diagnosis not present

## 2016-06-12 DIAGNOSIS — D631 Anemia in chronic kidney disease: Secondary | ICD-10-CM | POA: Diagnosis not present

## 2016-06-12 DIAGNOSIS — N186 End stage renal disease: Secondary | ICD-10-CM | POA: Diagnosis not present

## 2016-06-12 DIAGNOSIS — Z992 Dependence on renal dialysis: Secondary | ICD-10-CM | POA: Diagnosis not present

## 2016-06-12 DIAGNOSIS — N2581 Secondary hyperparathyroidism of renal origin: Secondary | ICD-10-CM | POA: Diagnosis not present

## 2016-06-13 ENCOUNTER — Ambulatory Visit (HOSPITAL_COMMUNITY)
Admission: RE | Admit: 2016-06-13 | Discharge: 2016-06-13 | Disposition: A | Discharge status: Home / Self Care | Payer: Medicare Other | Source: Ambulatory Visit | Attending: Oncology | Admitting: Oncology

## 2016-06-13 DIAGNOSIS — N261 Atrophy of kidney (terminal): Secondary | ICD-10-CM | POA: Diagnosis not present

## 2016-06-13 DIAGNOSIS — M5126 Other intervertebral disc displacement, lumbar region: Secondary | ICD-10-CM | POA: Diagnosis not present

## 2016-06-13 DIAGNOSIS — I2510 Atherosclerotic heart disease of native coronary artery without angina pectoris: Secondary | ICD-10-CM | POA: Insufficient documentation

## 2016-06-13 DIAGNOSIS — M48061 Spinal stenosis, lumbar region without neurogenic claudication: Secondary | ICD-10-CM | POA: Insufficient documentation

## 2016-06-13 DIAGNOSIS — M898X5 Other specified disorders of bone, thigh: Secondary | ICD-10-CM | POA: Diagnosis present

## 2016-06-13 DIAGNOSIS — M899 Disorder of bone, unspecified: Secondary | ICD-10-CM | POA: Diagnosis not present

## 2016-06-13 DIAGNOSIS — M25551 Pain in right hip: Secondary | ICD-10-CM | POA: Insufficient documentation

## 2016-06-14 ENCOUNTER — Ambulatory Visit (HOSPITAL_COMMUNITY): Payer: Medicare Other

## 2016-06-14 ENCOUNTER — Other Ambulatory Visit (HOSPITAL_COMMUNITY): Payer: Medicare Other

## 2016-06-14 DIAGNOSIS — N2581 Secondary hyperparathyroidism of renal origin: Secondary | ICD-10-CM | POA: Diagnosis not present

## 2016-06-14 DIAGNOSIS — N186 End stage renal disease: Secondary | ICD-10-CM | POA: Diagnosis not present

## 2016-06-14 DIAGNOSIS — D631 Anemia in chronic kidney disease: Secondary | ICD-10-CM | POA: Diagnosis not present

## 2016-06-14 DIAGNOSIS — D509 Iron deficiency anemia, unspecified: Secondary | ICD-10-CM | POA: Diagnosis not present

## 2016-06-14 DIAGNOSIS — D472 Monoclonal gammopathy: Secondary | ICD-10-CM | POA: Diagnosis not present

## 2016-06-14 DIAGNOSIS — Z992 Dependence on renal dialysis: Secondary | ICD-10-CM | POA: Diagnosis not present

## 2016-06-17 DIAGNOSIS — Z992 Dependence on renal dialysis: Secondary | ICD-10-CM | POA: Diagnosis not present

## 2016-06-17 DIAGNOSIS — N186 End stage renal disease: Secondary | ICD-10-CM | POA: Diagnosis not present

## 2016-06-17 DIAGNOSIS — N2581 Secondary hyperparathyroidism of renal origin: Secondary | ICD-10-CM | POA: Diagnosis not present

## 2016-06-17 DIAGNOSIS — D472 Monoclonal gammopathy: Secondary | ICD-10-CM | POA: Diagnosis not present

## 2016-06-17 DIAGNOSIS — D631 Anemia in chronic kidney disease: Secondary | ICD-10-CM | POA: Diagnosis not present

## 2016-06-17 DIAGNOSIS — D509 Iron deficiency anemia, unspecified: Secondary | ICD-10-CM | POA: Diagnosis not present

## 2016-06-18 ENCOUNTER — Encounter (HOSPITAL_COMMUNITY): Payer: Self-pay | Admitting: Hematology & Oncology

## 2016-06-18 ENCOUNTER — Encounter (HOSPITAL_COMMUNITY): Payer: Medicare Other | Attending: Hematology & Oncology | Admitting: Hematology & Oncology

## 2016-06-18 VITALS — BP 122/72 | HR 83 | Temp 97.5°F | Resp 16 | Wt 145.7 lb

## 2016-06-18 DIAGNOSIS — D472 Monoclonal gammopathy: Secondary | ICD-10-CM | POA: Diagnosis not present

## 2016-06-18 DIAGNOSIS — D649 Anemia, unspecified: Secondary | ICD-10-CM

## 2016-06-18 DIAGNOSIS — Z992 Dependence on renal dialysis: Secondary | ICD-10-CM

## 2016-06-18 DIAGNOSIS — Z940 Kidney transplant status: Secondary | ICD-10-CM

## 2016-06-18 DIAGNOSIS — M899 Disorder of bone, unspecified: Secondary | ICD-10-CM

## 2016-06-18 DIAGNOSIS — N186 End stage renal disease: Secondary | ICD-10-CM

## 2016-06-18 NOTE — Patient Instructions (Addendum)
[place] at [facility] Discharge Instructions  RECOMMENDATIONS MADE BY THE CONSULTANT AND ANY TEST RESULTS WILL BE SENT TO YOUR REFERRING PHYSICIAN.  You saw Dr.Penland today. You will be set up for PET scan after Thanksgiving. Follow up with Tom or Dr. Whitney Muse after PET scan. See Amy at checkout for appointments.  Thank you for choosing [place] at [facility] to provide your oncology and hematology care.  To afford each patient quality time with our provider, please arrive at least 15 minutes before your scheduled appointment time.   Beginning January 23rd 2017 lab work for the [facility] will be done in the  Main lab at [facility] on 1st floor. If you have a lab appointment with the [facility] please come in thru the  Main Entrance and check in at the main information desk  You need to re-schedule your appointment should you arrive 10 or more minutes late.  We strive to give you quality time with our providers, and arriving late affects you and other patients whose appointments are after yours.  Also, if you no show three or more times for appointments you may be dismissed from the clinic at the providers discretion.     Again, thank you for choosing [facility].  Our hope is that these requests will decrease the amount of time that you wait before being seen by our physicians.       _____________________________________________________________  Should you have questions after your visit to [facility], please contact our office at [phone] between the hours of 8:30 a.m. and 4:30 p.m.  Voicemails left after 4:30 p.m. will not be returned until the following business day.  For prescription refill requests, have your pharmacy contact our office.         Resources For Cancer Patients and their Caregivers ? American Cancer Society: Can assist with transportation, wigs, general needs, runs Look Good  Feel Better.        6263978973 ? Cancer Care: Provides financial assistance, online support groups, medication/co-pay assistance.  1-800-813-HOPE 719-342-2312) ? [place] Assists [place] cancer patients and their families through emotional , educational and financial support.  531 862 5207 ? [place] DSS Where to apply for food stamps, Medicaid and utility assistance. 763-638-9931 ? [facility]: Transportation to medical appointments. (857) 535-5000 ? Social Security Administration: May apply for disability if have a Stage IV cancer. 878-040-3461 (780)623-0038 ? [employer], Disability and Transit Services: Assists with nutrition, care and transit needs. [place] Support Programs: @10RELATIVEDAYS @ > Cancer Support Group  2nd Tuesday of the month 1pm-2pm, Journey Room  > Creative Journey  3rd Tuesday of the month 1130am-1pm, Journey Room  > Look Good Feel Better  1st Wednesday of the month 10am-12 noon, Journey Room (Call [place] to register [phone])

## 2016-06-18 NOTE — Progress Notes (Signed)
[place] NOTE  Patient Care Team: [name], MD as PCP - General (Internal Medicine) Fran Lowes, MD as Attending Physician (Nephrology)  CHIEF COMPLAINTS/PURPOSE OF CONSULTATION:  MGUS End stage renal disease, on hemodialysis  HISTORY OF PRESENTING ILLNESS:  Brian Liu 65 y.o. male is here because of referral from [place]. He has a history of MGUS, BMBX back in 06/2014. He has been on dialysis since 2003. He had a kidney transplant in 1997. He notes he developed renal failure from HTN.  Patient has had a flu shot.   He has undergone BMBX for further evaluation. Myeloma survey was abnormal. He presents today for ongoing follow-up.  No change in appetite or energy since last visit No problems at the [facility] site.   MEDICAL HISTORY:  Past Medical History:  Diagnosis Date  . Atrial fibrillation (HCC)    Permanent, Not on Coumadin because of history of lower GI bleed  . Diastolic dysfunction    Restrictive  diastolic filling pattern echo,  February, 2013  . Dyslipidemia   . Ejection fraction    EF 60-65%, echo, 2013  . ESRD (end stage renal disease) ([place])    Dialysis, fistula in left arm  . Hypertension   . Lower gastrointestinal bleed   . LVH (left ventricular hypertrophy)   . 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 Mitral regurgitation    Mild by echo, 2013  . Pancytopenia ([place])    Dr Ignacia Bayley  . Pulmonary hypertension    Severe, PA pressure 65-70 mm of mercury, echo, 2013  . Tobacco abuse     SURGICAL HISTORY: Past Surgical History:  Procedure Laterality Date  . AV FISTULA PLACEMENT Left 1992   Left arm   . KIDNEY TRANSPLANT     failed transplant after 7 years, placed at [place]: Social History   Social History  . Marital status: Widowed    Spouse name: N/A  . Number of children: N/A  . Years of education: N/A   Occupational History  . Not on file.   Social  History Main Topics  . Smoking status: Never Smoker  . Smokeless tobacco: Never Used  . Alcohol use No  . Drug use: No  . Sexual activity: Not on file   Other Topics Concern  . Not on file   Social History Narrative   Lives alone in a one story home.  Has 2 sons.     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     Education high school.   Widowed since 2003. 2 boys. One grandson. Used to drive a forklift. Born here in [place]. Non/never smoker No Pets. Loves basketball. Used to play frequently, now just watches.   FAMILY HISTORY: Family History  Problem Relation Age of Onset  . Other Mother     Deceased, 7  . Stomach cancer Father     Deceased, 33  . Kidney failure Son   . Healthy Son   7 sisters, 60 living 4 brother 2 living Mother died at 31 from natural causes Father died at 52 from stomach cancer He notes his deceased siblings died from stomach cancer, complications of HTN, heart issues  ALLERGIES:  has No Known Allergies.  MEDICATIONS:  Current Outpatient Prescriptions  Medication Sig Dispense Refill  . AZOPT 1 % ophthalmic suspension   4  . bimatoprost (LUMIGAN) 0.03 % ophthalmic solution  1 drop.    . cinacalcet (SENSIPAR) 30 MG tablet Take 30 mg by mouth.    . folic acid (FOLVITE) 1 MG tablet Take 1 mg by mouth daily.    [name] gabapentin (NEURONTIN) 300 MG capsule Take 1 capsule (300 mg total) by mouth 3 (three) times daily. 300 capsule 3  . labetalol (NORMODYNE) 100 MG tablet TAKE 1 BY MOUTH TWO TIMES  DAILY 180 tablet 0  . lanthanum (FOSRENOL) 500 MG chewable tablet Chew 1,000 mg by mouth 3 (three) times daily with meals. And 1 with snacks    . latanoprost (XALATAN) 0.005 % ophthalmic solution Place 1 drop into both eyes at bedtime.  6  . levothyroxine (SYNTHROID, LEVOTHROID) 137 MCG tablet Take 137 mcg by mouth daily before breakfast.    . minoxidil (LONITEN) 2.5 MG tablet     . Multiple Vitamin (DAILY VITE) TABS Take 1  tablet by mouth daily.     [name] SIMBRINZA 1-0.2 % SUSP INSTILL 1 DROP IN INTO BOTH EYES TWICE A DAY  6  . simvastatin (ZOCOR) 20 MG tablet Take 20 mg by mouth daily with supper.      [name] UNABLE TO FIND Take 1 tablet by mouth. Dailyvite 800 mg     No current facility-administered medications for this visit.     Review of Systems  Constitutional: Negative for chills, diaphoresis, fever, malaise/fatigue and weight loss.  HENT: Negative for congestion, ear discharge, ear pain, hearing loss, nosebleeds, sore throat and tinnitus.   Eyes: Negative for blurred vision, double vision, photophobia, pain, discharge and redness.  Respiratory: Negative for cough, hemoptysis, sputum production, shortness of breath, wheezing and stridor.   Cardiovascular: Negative for chest pain, palpitations, orthopnea, claudication, leg swelling and PND.  Gastrointestinal: Negative for abdominal pain, blood in stool, constipation, diarrhea, heartburn, melena, nausea and vomiting.  Genitourinary: Negative for dysuria, flank pain, frequency, hematuria and urgency.  Musculoskeletal: Positive for joint pain. Negative for back pain, falls, myalgias and neck pain.  Skin: Negative for itching and rash.  Neurological: Negative for dizziness, tingling, tremors, sensory change, speech change, focal weakness, seizures, loss of consciousness, weakness and headaches.  Endo/Heme/Allergies: Negative for environmental allergies and polydipsia. Does not bruise/bleed easily.  Psychiatric/Behavioral: Negative.    14 point ROS was done and is otherwise as detailed above or in HPI  PHYSICAL EXAMINATION: ECOG PERFORMANCE STATUS: 1 - Symptomatic but completely ambulatory  Vitals:   06/18/16 0915  BP: 122/72  Pulse: 83  Resp: 16  Temp: 97.5 F (36.4 C)   Filed Weights   06/18/16 0915  Weight: 145 lb 11.2 oz (66.1 kg)   Physical Exam  Constitutional: He is oriented to person, place, and time and well-developed, well-nourished, and in no  distress.  HENT:  Head: Normocephalic and atraumatic.  Nose: Nose normal.  Mouth/Throat: Oropharynx is clear and moist. No oropharyngeal exudate.  Eyes: Conjunctivae and EOM are normal. Pupils are equal, round, and reactive to light. Right eye exhibits no discharge. Left eye exhibits no discharge. No scleral icterus.  Neck: Normal range of motion. Neck supple. No tracheal deviation present. No thyromegaly present.  Cardiovascular: Normal rate and regular rhythm.  Exam reveals no gallop and no friction rub.   Murmur heard. Pulmonary/Chest: Effort normal and breath sounds normal. He has no wheezes. He has no rales.  Abdominal: Soft. Bowel sounds are normal. He exhibits no distension and no mass. There is no tenderness. There is no rebound and no guarding.  Musculoskeletal: Normal range of  motion. He exhibits no edema.  Fistula LUE  Lymphadenopathy:    He has no cervical adenopathy.  Neurological: He is alert and oriented to person, place, and time. He has normal reflexes. No cranial nerve deficit. Gait normal. Coordination normal.  Skin: Skin is warm and dry. No rash noted.  Psychiatric: Mood, memory, affect and judgment normal.  Nursing note and vitals reviewed.   LABORATORY DATA:  I have reviewed the data as listed Lab Results  Component Value Date   WBC 4.1 06/11/2016   HGB 11.6 (L) 06/11/2016   HCT 36.8 (L) 06/11/2016   MCV 81.4 06/11/2016   PLT 141 (L) 06/11/2016   CMP     Component Value Date/Time   NA 140 06/11/2016 0706   K 4.9 06/11/2016 0706   CL 99 (L) 06/11/2016 0706   CO2 29 06/11/2016 0706   GLUCOSE 86 06/11/2016 0706   BUN 26 (H) 06/11/2016 0706   CREATININE 7.41 (H) 06/11/2016 0706   CALCIUM 8.8 (L) 06/11/2016 0706   PROT 9.1 (H) 04/30/2016 1513   ALBUMIN 4.4 04/30/2016 1513   AST 18 04/30/2016 1513   ALT 9 (L) 04/30/2016 1513   ALKPHOS 49 04/30/2016 1513   BILITOT 0.7 04/30/2016 1513   GFRNONAA 7 (L) 06/11/2016 0706   GFRAA 8 (L) 06/11/2016 0706     PATHOLOGY:     RADIOGRAPHIC STUDIES: I have personally reviewed the radiological images as listed and agreed with the findings in the report. No results found. Study Result   CLINICAL DATA:  Lytic lesions in the femoral necks iliac crests in patient with history of end-stage renal disease. Status post bone biopsy 06/11/2016.  EXAM: CT PELVIS WITHOUT CONTRAST  TECHNIQUE: Multidetector CT imaging of the pelvis was performed following the standard protocol without intravenous contrast.  COMPARISON:  CT abdomen and pelvis 10/01/2011.  FINDINGS: Erosions are seen about the sacroiliac joints bilaterally. More extensive erosive changes also present about the femoral heads and necks, much worse on the right. Erosions are present about the SI joints and femoral necks on the prior CT scan but have progressed since that study. Intramedullary spaces of image bones demonstrate subtle increased density. No fracture is identified. No notable degenerative change about the hips.  Marked synovial/capsular thickening is present about the hips bilaterally, much worse on the right. Volume of joint effusions is decreased since the prior CT scan. Musculature about the pelvis is intact.  There is partial visualization of a calcified left lower quadrant renal transplant. Atherosclerotic vascular disease is noted. Atrophy of the left rectus abdominis is identified.  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 Erosive change about the hips could also be due to coexistent amyloidosis. The appearance is not suggestive neoplastic process.  Decreased hip joint fusion since the prior CT scan with persist capsular thickening compatible synovitis like related to renal osteodystrophy.  Atherosclerosis.   Electronically Signed   By: Inge Rise M.D.   On: 06/14/2016 09:14    Study Result   CLINICAL DATA:  Lytic bone lesions on x-ray. Chronic renal failure on dialysis. History of failed renal transplant  EXAM: CT LUMBAR SPINE WITHOUT CONTRAST  TECHNIQUE: Multidetector CT imaging of the lumbar spine was performed without intravenous contrast administration. Multiplanar CT image reconstructions were also generated.  COMPARISON:  CT pelvis 06/13/2016  FINDINGS: Segmentation: Normal  Alignment: Normal  Vertebrae: Multiple lytic  lesions are seen in the vertebral body endplates from F64 through L4. L4-5 and L5-S1 are spared. These lytic lesions communicate with the disc space and are surrounded by sclerotic change. No lesions are seen within the central portion of the vertebra. Small lytic lesions are seen around the facet joint on the right is L2-3. Posterior elements otherwise spared. No vertebral fracture. 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 This has a similar appearance and etiology as the lytic lesions around the femoral head and neck bilaterally on the CT pelvis. Similar but less prominent lesions in the SI joints bilaterally.  Paraspinal and other soft tissues: Calcified mass in the left pelvis compatible with failed renal transplant. Atherosclerotic disease. Bilateral renal atrophy atrophy from renal failure. Bilateral renal lesions compatible with cysts.  Disc levels: L1-2: Disc and facet degeneration. Disc bulging. Mild spinal stenosis.  L2-3: Moderate spinal stenosis. Disc bulging and facet and ligamentum flavum hypertrophy.  L3-4: Mild spinal stenosis. Disc bulging and posterior element hypertrophy.  L4-5: Disc bulging and mild facet degeneration. Subarticular stenosis bilaterally.  L5-S1: Mild disc and facet degeneration without significant stenosis.  IMPRESSION: Multiple endplate lytic lesions are seen from T11 through  L4. These are associated with surrounding sclerosis and have a benign appearance. 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 Similar lesions are seen in the SI joints and in the hips bilaterally.  Bilateral renal atrophy and calcified failed renal transplant in the left pelvis.  Multilevel degenerative change and spinal stenosis due to disc and facet degeneration.   Electronically Signed   By: Franchot Gallo M.D.   On: 06/14/2016 06:57     ASSESSMENT & PLAN:  MGUS End stage renal disease, on hemodialysis BMBX on 11/9 9% plasma cells by aspirate, 15% by CD 138  He was last seen at [facility] with Dr. Jacquiline Doe on 03/02/2015. He has ESRD and has been on hemodialysis for many years. BMBX was last performed in 2015. Myeloma survey was abnormal which may have been secondary to renal osteodystrophy? He has long standing CKD.no hypercalcemia, anemia is mild.  Have recommended PET/CT and he is agreeable.   If PET is normal I suspect ongoing observation. If abnormal will discuss therapy. Either way however, I anticipate a referral to [facility] for an opinion especially given positive congo red.  ORDERS PLACED FOR THIS ENCOUNTER: Orders Placed This Encounter  Procedures  . NM PET Image Initial (PI) Whole Body    Standing Status:   Future    Number of Occurrences:   1    Standing Expiration Date:   06/18/2017    Order Specific Question:   Reason for Exam (SYMPTOM  OR DIAGNOSIS REQUIRED)    Answer:   MGUS, ESRD on dialysis, lytic bone disease on imaging.Multiple lytic lesions noted throughout the skull consistent with multiple myeloma    Order Specific Question:   Preferred imaging location?    Answer:   [facility]    Order Specific Question:   If indicated for the ordered procedure, I authorize the administration of a radiopharmaceutical per Radiology protocol    Answer:   Yes   All questions were answered. The patient knows to call the  clinic with any problems, questions or concerns.  This document serves as a record of services personally performed by Ancil Linsey, MD. It was created on her behalf by Elmyra Ricks, a trained medical scribe. The creation of this record is based on the scribe's personal observations  and the provider's statements to them. This document has been checked and approved by the attending provider.  I have reviewed the above documentation for accuracy and completeness and I agree with the above.  This note was electronically signed.    Molli Hazard, MD  06/18/2016 9:55 AM

## 2016-06-19 DIAGNOSIS — N2581 Secondary hyperparathyroidism of renal origin: Secondary | ICD-10-CM | POA: Diagnosis not present

## 2016-06-19 DIAGNOSIS — D472 Monoclonal gammopathy: Secondary | ICD-10-CM | POA: Diagnosis not present

## 2016-06-19 DIAGNOSIS — Z992 Dependence on renal dialysis: Secondary | ICD-10-CM | POA: Diagnosis not present

## 2016-06-19 DIAGNOSIS — D509 Iron deficiency anemia, unspecified: Secondary | ICD-10-CM | POA: Diagnosis not present

## 2016-06-19 DIAGNOSIS — D631 Anemia in chronic kidney disease: Secondary | ICD-10-CM | POA: Diagnosis not present

## 2016-06-19 DIAGNOSIS — N186 End stage renal disease: Secondary | ICD-10-CM | POA: Diagnosis not present

## 2016-06-20 ENCOUNTER — Ambulatory Visit (HOSPITAL_COMMUNITY): Payer: Medicare Other | Admitting: Hematology & Oncology

## 2016-06-20 DIAGNOSIS — H401133 Primary open-angle glaucoma, bilateral, severe stage: Secondary | ICD-10-CM | POA: Diagnosis not present

## 2016-06-21 DIAGNOSIS — D509 Iron deficiency anemia, unspecified: Secondary | ICD-10-CM | POA: Diagnosis not present

## 2016-06-21 DIAGNOSIS — N186 End stage renal disease: Secondary | ICD-10-CM | POA: Diagnosis not present

## 2016-06-21 DIAGNOSIS — Z992 Dependence on renal dialysis: Secondary | ICD-10-CM | POA: Diagnosis not present

## 2016-06-21 DIAGNOSIS — D472 Monoclonal gammopathy: Secondary | ICD-10-CM | POA: Diagnosis not present

## 2016-06-21 DIAGNOSIS — D631 Anemia in chronic kidney disease: Secondary | ICD-10-CM | POA: Diagnosis not present

## 2016-06-21 DIAGNOSIS — N2581 Secondary hyperparathyroidism of renal origin: Secondary | ICD-10-CM | POA: Diagnosis not present

## 2016-06-21 LAB — CHROMOSOME ANALYSIS, BONE MARROW

## 2016-06-21 LAB — TISSUE HYBRIDIZATION (BONE MARROW)-NCBH

## 2016-06-24 DIAGNOSIS — D472 Monoclonal gammopathy: Secondary | ICD-10-CM | POA: Diagnosis not present

## 2016-06-24 DIAGNOSIS — Z992 Dependence on renal dialysis: Secondary | ICD-10-CM | POA: Diagnosis not present

## 2016-06-24 DIAGNOSIS — D509 Iron deficiency anemia, unspecified: Secondary | ICD-10-CM | POA: Diagnosis not present

## 2016-06-24 DIAGNOSIS — N2581 Secondary hyperparathyroidism of renal origin: Secondary | ICD-10-CM | POA: Diagnosis not present

## 2016-06-24 DIAGNOSIS — D631 Anemia in chronic kidney disease: Secondary | ICD-10-CM | POA: Diagnosis not present

## 2016-06-24 DIAGNOSIS — N186 End stage renal disease: Secondary | ICD-10-CM | POA: Diagnosis not present

## 2016-06-26 ENCOUNTER — Encounter (HOSPITAL_COMMUNITY): Payer: Self-pay

## 2016-06-26 DIAGNOSIS — N2581 Secondary hyperparathyroidism of renal origin: Secondary | ICD-10-CM | POA: Diagnosis not present

## 2016-06-26 DIAGNOSIS — D631 Anemia in chronic kidney disease: Secondary | ICD-10-CM | POA: Diagnosis not present

## 2016-06-26 DIAGNOSIS — Z992 Dependence on renal dialysis: Secondary | ICD-10-CM | POA: Diagnosis not present

## 2016-06-26 DIAGNOSIS — I10 Essential (primary) hypertension: Secondary | ICD-10-CM | POA: Diagnosis not present

## 2016-06-26 DIAGNOSIS — N186 End stage renal disease: Secondary | ICD-10-CM | POA: Diagnosis not present

## 2016-06-26 DIAGNOSIS — D509 Iron deficiency anemia, unspecified: Secondary | ICD-10-CM | POA: Diagnosis not present

## 2016-06-26 DIAGNOSIS — D472 Monoclonal gammopathy: Secondary | ICD-10-CM | POA: Diagnosis not present

## 2016-06-26 DIAGNOSIS — E7800 Pure hypercholesterolemia, unspecified: Secondary | ICD-10-CM | POA: Diagnosis not present

## 2016-06-28 DIAGNOSIS — N186 End stage renal disease: Secondary | ICD-10-CM | POA: Diagnosis not present

## 2016-06-28 DIAGNOSIS — N2581 Secondary hyperparathyroidism of renal origin: Secondary | ICD-10-CM | POA: Diagnosis not present

## 2016-06-28 DIAGNOSIS — D631 Anemia in chronic kidney disease: Secondary | ICD-10-CM | POA: Diagnosis not present

## 2016-06-28 DIAGNOSIS — Z992 Dependence on renal dialysis: Secondary | ICD-10-CM | POA: Diagnosis not present

## 2016-06-28 DIAGNOSIS — D472 Monoclonal gammopathy: Secondary | ICD-10-CM | POA: Diagnosis not present

## 2016-06-28 DIAGNOSIS — D509 Iron deficiency anemia, unspecified: Secondary | ICD-10-CM | POA: Diagnosis not present

## 2016-07-01 DIAGNOSIS — D631 Anemia in chronic kidney disease: Secondary | ICD-10-CM | POA: Diagnosis not present

## 2016-07-01 DIAGNOSIS — Z992 Dependence on renal dialysis: Secondary | ICD-10-CM | POA: Diagnosis not present

## 2016-07-01 DIAGNOSIS — D472 Monoclonal gammopathy: Secondary | ICD-10-CM | POA: Diagnosis not present

## 2016-07-01 DIAGNOSIS — N2581 Secondary hyperparathyroidism of renal origin: Secondary | ICD-10-CM | POA: Diagnosis not present

## 2016-07-01 DIAGNOSIS — D509 Iron deficiency anemia, unspecified: Secondary | ICD-10-CM | POA: Diagnosis not present

## 2016-07-01 DIAGNOSIS — N186 End stage renal disease: Secondary | ICD-10-CM | POA: Diagnosis not present

## 2016-07-03 DIAGNOSIS — Z992 Dependence on renal dialysis: Secondary | ICD-10-CM | POA: Diagnosis not present

## 2016-07-03 DIAGNOSIS — D631 Anemia in chronic kidney disease: Secondary | ICD-10-CM | POA: Diagnosis not present

## 2016-07-03 DIAGNOSIS — D472 Monoclonal gammopathy: Secondary | ICD-10-CM | POA: Diagnosis not present

## 2016-07-03 DIAGNOSIS — N186 End stage renal disease: Secondary | ICD-10-CM | POA: Diagnosis not present

## 2016-07-03 DIAGNOSIS — N2581 Secondary hyperparathyroidism of renal origin: Secondary | ICD-10-CM | POA: Diagnosis not present

## 2016-07-03 DIAGNOSIS — D509 Iron deficiency anemia, unspecified: Secondary | ICD-10-CM | POA: Diagnosis not present

## 2016-07-04 ENCOUNTER — Other Ambulatory Visit: Payer: Self-pay | Admitting: Cardiovascular Disease

## 2016-07-04 ENCOUNTER — Ambulatory Visit (HOSPITAL_COMMUNITY)
Admission: RE | Admit: 2016-07-04 | Discharge: 2016-07-04 | Disposition: A | Discharge status: Home / Self Care | Payer: Medicare Other | Source: Ambulatory Visit | Attending: Hematology & Oncology | Admitting: Hematology & Oncology

## 2016-07-04 DIAGNOSIS — I700 Atherosclerosis of aorta: Secondary | ICD-10-CM | POA: Insufficient documentation

## 2016-07-04 DIAGNOSIS — D472 Monoclonal gammopathy: Secondary | ICD-10-CM | POA: Diagnosis not present

## 2016-07-04 DIAGNOSIS — R911 Solitary pulmonary nodule: Secondary | ICD-10-CM | POA: Diagnosis not present

## 2016-07-04 DIAGNOSIS — R590 Localized enlarged lymph nodes: Secondary | ICD-10-CM | POA: Insufficient documentation

## 2016-07-04 DIAGNOSIS — M898X9 Other specified disorders of bone, unspecified site: Secondary | ICD-10-CM

## 2016-07-04 DIAGNOSIS — N186 End stage renal disease: Secondary | ICD-10-CM | POA: Diagnosis not present

## 2016-07-04 DIAGNOSIS — J929 Pleural plaque without asbestos: Secondary | ICD-10-CM | POA: Insufficient documentation

## 2016-07-04 DIAGNOSIS — D739 Disease of spleen, unspecified: Secondary | ICD-10-CM | POA: Insufficient documentation

## 2016-07-04 DIAGNOSIS — M899 Disorder of bone, unspecified: Secondary | ICD-10-CM

## 2016-07-04 DIAGNOSIS — C9000 Multiple myeloma not having achieved remission: Secondary | ICD-10-CM | POA: Diagnosis not present

## 2016-07-04 LAB — GLUCOSE, CAPILLARY: Glucose-Capillary: 92 mg/dL (ref 65–99)

## 2016-07-04 MED ORDER — FLUDEOXYGLUCOSE F - 18 (FDG) INJECTION: 7.4000 | Freq: Once | INTRAVENOUS | Status: DC | PRN

## 2016-07-05 DIAGNOSIS — D509 Iron deficiency anemia, unspecified: Secondary | ICD-10-CM | POA: Diagnosis not present

## 2016-07-05 DIAGNOSIS — N2581 Secondary hyperparathyroidism of renal origin: Secondary | ICD-10-CM | POA: Diagnosis not present

## 2016-07-05 DIAGNOSIS — D472 Monoclonal gammopathy: Secondary | ICD-10-CM | POA: Diagnosis not present

## 2016-07-05 DIAGNOSIS — D631 Anemia in chronic kidney disease: Secondary | ICD-10-CM | POA: Diagnosis not present

## 2016-07-05 DIAGNOSIS — Z992 Dependence on renal dialysis: Secondary | ICD-10-CM | POA: Diagnosis not present

## 2016-07-05 DIAGNOSIS — N186 End stage renal disease: Secondary | ICD-10-CM | POA: Diagnosis not present

## 2016-07-08 DIAGNOSIS — N186 End stage renal disease: Secondary | ICD-10-CM | POA: Diagnosis not present

## 2016-07-08 DIAGNOSIS — D472 Monoclonal gammopathy: Secondary | ICD-10-CM | POA: Diagnosis not present

## 2016-07-08 DIAGNOSIS — D509 Iron deficiency anemia, unspecified: Secondary | ICD-10-CM | POA: Diagnosis not present

## 2016-07-08 DIAGNOSIS — N2581 Secondary hyperparathyroidism of renal origin: Secondary | ICD-10-CM | POA: Diagnosis not present

## 2016-07-08 DIAGNOSIS — Z992 Dependence on renal dialysis: Secondary | ICD-10-CM | POA: Diagnosis not present

## 2016-07-08 DIAGNOSIS — D631 Anemia in chronic kidney disease: Secondary | ICD-10-CM | POA: Diagnosis not present

## 2016-07-09 ENCOUNTER — Encounter (HOSPITAL_COMMUNITY): Payer: Medicare Other | Attending: Hematology & Oncology | Admitting: Hematology & Oncology

## 2016-07-09 ENCOUNTER — Encounter (HOSPITAL_COMMUNITY): Payer: Self-pay | Admitting: Hematology & Oncology

## 2016-07-09 VITALS — BP 110/77 | HR 83 | Temp 97.6°F | Resp 18 | Ht 73.0 in | Wt 146.2 lb

## 2016-07-09 DIAGNOSIS — R599 Enlarged lymph nodes, unspecified: Secondary | ICD-10-CM

## 2016-07-09 DIAGNOSIS — D472 Monoclonal gammopathy: Secondary | ICD-10-CM

## 2016-07-09 DIAGNOSIS — I120 Hypertensive chronic kidney disease with stage 5 chronic kidney disease or end stage renal disease: Secondary | ICD-10-CM

## 2016-07-09 DIAGNOSIS — N186 End stage renal disease: Secondary | ICD-10-CM

## 2016-07-09 DIAGNOSIS — Z940 Kidney transplant status: Secondary | ICD-10-CM | POA: Diagnosis not present

## 2016-07-09 DIAGNOSIS — R918 Other nonspecific abnormal finding of lung field: Secondary | ICD-10-CM

## 2016-07-09 DIAGNOSIS — N250 Renal osteodystrophy: Secondary | ICD-10-CM

## 2016-07-09 DIAGNOSIS — Z992 Dependence on renal dialysis: Secondary | ICD-10-CM

## 2016-07-09 NOTE — Patient Instructions (Addendum)
[place] at [facility] Discharge Instructions  RECOMMENDATIONS MADE BY THE CONSULTANT AND ANY TEST RESULTS WILL BE SENT TO YOUR REFERRING PHYSICIAN.  You saw Dr.Penland today. Will refer you to Dr. Luan Pulling. Follow up in 2 months with lab work. See Amy at checkout for appointments.  Thank you for choosing [place] at [facility] to provide your oncology and hematology care.  To afford each patient quality time with our provider, please arrive at least 15 minutes before your scheduled appointment time.   Beginning January 23rd 2017 lab work for the [facility] will be done in the  Main lab at [facility] on 1st floor. If you have a lab appointment with the [facility] please come in thru the  Main Entrance and check in at the main information desk  You need to re-schedule your appointment should you arrive 10 or more minutes late.  We strive to give you quality time with our providers, and arriving late affects you and other patients whose appointments are after yours.  Also, if you no show three or more times for appointments you may be dismissed from the clinic at the providers discretion.     Again, thank you for choosing [facility].  Our hope is that these requests will decrease the amount of time that you wait before being seen by our physicians.       _____________________________________________________________  Should you have questions after your visit to [facility], please contact our office at [phone] between the hours of 8:30 a.m. and 4:30 p.m.  Voicemails left after 4:30 p.m. will not be returned until the following business day.  For prescription refill requests, have your pharmacy contact our office.         Resources For Cancer Patients and their Caregivers ? American Cancer Society: Can assist with transportation, wigs, general needs, runs Look Good Feel Better.         (403)029-4989 ? Cancer Care: Provides financial assistance, online support groups, medication/co-pay assistance.  1-800-813-HOPE 661-093-9481) ? [facility] Assists [place] cancer patients and their families through emotional , educational and financial support.  442-338-2919 ? [place] DSS Where to apply for food stamps, Medicaid and utility assistance. 626-239-3949 ? [facility]: Transportation to medical appointments. (231)451-9724 ? Social Security Administration: May apply for disability if have a Stage IV cancer. 908-608-9210 802-672-8090 ? [employer], Disability and Transit Services: Assists with nutrition, care and transit needs. [place] Support Programs: @10RELATIVEDAYS @ > Cancer Support Group  2nd Tuesday of the month 1pm-2pm, Journey Room  > Creative Journey  3rd Tuesday of the month 1130am-1pm, Journey Room  > Look Good Feel Better  1st Wednesday of the month 10am-12 noon, Journey Room (Call [place] to register [phone])

## 2016-07-09 NOTE — Progress Notes (Signed)
[facility]  PROGRESS NOTE  Patient Care Team: [name], MD as PCP - General (Internal Medicine) Fran Lowes, MD as Attending Physician (Nephrology)  CHIEF COMPLAINTS:  MGUS End stage renal disease, on hemodialysis Renal osteodystrophy  HISTORY OF PRESENTING ILLNESS:  Brian Liu 65 y.o. male is here for a follow up of MGUS, BMBX back in 06/2014. He has been on dialysis since 2003. He had a kidney transplant in 1997. He notes he developed renal failure from HTN.  Patient reports poor appetite. Denies weight loss, changes in bowels or bladder, or a cough.  He notes that he was wearing flip flops the other day and tripped and fell, he has a wound on the R wrist. He is here today to review imaging studies. He had an abnormal myeloma survey and has undergone a PET/CT.    MEDICAL HISTORY:  Past Medical History:  Diagnosis Date  . Atrial fibrillation (HCC)    Permanent, Not on Coumadin because of history of lower GI bleed  . Diastolic dysfunction    Restrictive  diastolic filling pattern echo,  February, 2013  . Dyslipidemia   . Ejection fraction    EF 60-65%, echo, 2013  . ESRD (end stage renal disease) ([place])    Dialysis, fistula in left arm  . Hypertension   . Lower gastrointestinal bleed   . LVH (left ventricular hypertrophy)   . 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 Mitral regurgitation    Mild by echo, 2013  . Pancytopenia ([place])    Dr Ignacia Bayley  . Pulmonary hypertension    Severe, PA pressure 65-70 mm of mercury, echo, 2013  . Tobacco abuse     SURGICAL HISTORY: Past Surgical History:  Procedure Laterality Date  . AV FISTULA PLACEMENT Left 1992   Left arm   . KIDNEY TRANSPLANT     failed transplant after 7 years, placed at [facility]: Social History   Social History  . Marital status: Widowed    Spouse name: N/A  . Number of children: N/A  . Years of education: N/A    Occupational History  . Not on file.   Social History Main Topics  . Smoking status: Never Smoker  . Smokeless tobacco: Never Used  . Alcohol use No  . Drug use: No  . Sexual activity: Not on file   Other Topics Concern  . Not on file   Social History Narrative   Lives alone in a one story home.  Has 2 sons.     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     Education high school.   Widowed since 2003. 2 boys. One grandson. Used to drive a forklift. Born here in [place]. Non/never smoker No Pets. Loves basketball. Used to play frequently, now just watches.   FAMILY HISTORY: Family History  Problem Relation Age of Onset  . Other Mother     Deceased, 5  . Stomach cancer Father     Deceased, 82  . Kidney failure Son   . Healthy Son   7 sisters, 35 living 4 brother 2 living Mother died at 36 from natural causes Father died at 68 from stomach cancer He notes his deceased siblings died from stomach cancer, complications of HTN, heart issues  ALLERGIES:  has No Known Allergies.  MEDICATIONS:  Current Outpatient Prescriptions  Medication Sig Dispense Refill  . AZOPT 1 %  ophthalmic suspension   4  . bimatoprost (LUMIGAN) 0.03 % ophthalmic solution 1 drop.    . cinacalcet (SENSIPAR) 30 MG tablet Take 30 mg by mouth.    . folic acid (FOLVITE) 1 MG tablet Take 1 mg by mouth daily.    [name] gabapentin (NEURONTIN) 300 MG capsule Take 1 capsule (300 mg total) by mouth 3 (three) times daily. 300 capsule 3  . labetalol (NORMODYNE) 100 MG tablet TAKE 1 BY MOUTH TWO TIMES  DAILY 180 tablet 0  . lanthanum (FOSRENOL) 500 MG chewable tablet Chew 1,000 mg by mouth 3 (three) times daily with meals. And 1 with snacks    . latanoprost (XALATAN) 0.005 % ophthalmic solution Place 1 drop into both eyes at bedtime.  6  . levobunolol (BETAGAN) 0.5 % ophthalmic solution     . levothyroxine (SYNTHROID, LEVOTHROID) 137 MCG tablet Take 137 mcg by mouth daily  before breakfast.    . minoxidil (LONITEN) 2.5 MG tablet     . Multiple Vitamin (DAILY VITE) TABS Take 1 tablet by mouth daily.     [name] SIMBRINZA 1-0.2 % SUSP INSTILL 1 DROP IN INTO BOTH EYES TWICE A DAY  6  . simvastatin (ZOCOR) 20 MG tablet Take 20 mg by mouth daily with supper.      [name] UNABLE TO FIND Take 1 tablet by mouth. Dailyvite 800 mg     No current facility-administered medications for this visit.    Facility-Administered Medications Ordered in Other Visits  Medication Dose Route Frequency Provider Last Rate Last Dose  . fludeoxyglucose F - 18 (FDG) injection 7.4 millicurie  7.4 millicurie Intravenous Once PRN Gaspar Cola, MD        Review of Systems  Constitutional: Negative for chills, diaphoresis, fever, malaise/fatigue and weight loss. Poor appetite  HENT: Negative for congestion, ear discharge, ear pain, hearing loss, nosebleeds, sore throat and tinnitus.   Eyes: Negative for blurred vision, double vision, photophobia, pain, discharge and redness.  Respiratory: Negative for cough, hemoptysis, sputum production, shortness of breath, wheezing and stridor.   Cardiovascular: Negative for chest pain, palpitations, orthopnea, claudication, leg swelling and PND.  Gastrointestinal: Negative for abdominal pain, blood in stool, constipation, diarrhea, heartburn, melena, nausea and vomiting.  Genitourinary: Negative for dysuria, flank pain, frequency, hematuria and urgency.  Musculoskeletal: Negative for back pain, myalgias, and neck pain.  Skin: Negative for itching and rash.  Neurological: Negative for dizziness, tingling, tremors, sensory change, speech change, focal weakness, seizures, loss of consciousness, weakness and headaches.  Endo/Heme/Allergies: Negative for environmental allergies and polydipsia. Does not bruise/bleed easily.  Psychiatric/Behavioral: Negative.    14 point ROS was done and is otherwise as detailed above or in HPI  PHYSICAL EXAMINATION: ECOG PERFORMANCE  STATUS: 1 - Symptomatic but completely ambulatory  Vitals:   07/09/16 0851  BP: 110/77  Pulse: 83  Resp: 18  Temp: 97.6 F (36.4 C)   Filed Weights   07/09/16 0851  Weight: 146 lb 3.2 oz (66.3 kg)   Physical Exam  Constitutional: He is oriented to person, place, and time and well-developed, well-nourished, and in no distress.  HENT:  Head: Normocephalic and atraumatic.  Nose: Nose normal.  Mouth/Throat: Oropharynx is clear and moist. No oropharyngeal exudate.  Eyes: Conjunctivae and EOM are normal. Pupils are equal, round, and reactive to light. Right eye exhibits no discharge. Left eye exhibits no discharge. No scleral icterus.  Neck: Normal range of motion. Neck supple. No tracheal deviation present. No thyromegaly present.  Cardiovascular: Normal  rate and regular rhythm.  Exam reveals no gallop and no friction rub.   Systolic ejection murmur heard. Pulmonary/Chest: Effort normal and breath sounds normal. He has no wheezes. He has no rales.  Abdominal: Soft. Bowel sounds are normal. He exhibits no distension and no mass. There is no tenderness. There is no rebound and no guarding.  Musculoskeletal: Normal range of motion. He exhibits no edema.  Fistula LUE  Lymphadenopathy:    He has no cervical adenopathy.  Neurological: He is alert and oriented to person, place, and time. He has normal reflexes. No cranial nerve deficit. Gait normal. Coordination normal.  Skin: Skin is warm and dry. No rash noted.  Scab on right wrist from a recent fall. Psychiatric: Mood, memory, affect and judgment normal.  Nursing note and vitals reviewed.   LABORATORY DATA:  I have reviewed the data as listed Lab Results  Component Value Date   WBC 4.1 06/11/2016   HGB 11.6 (L) 06/11/2016   HCT 36.8 (L) 06/11/2016   MCV 81.4 06/11/2016   PLT 141 (L) 06/11/2016   CMP     Component Value Date/Time   NA 140 06/11/2016 0706   K 4.9 06/11/2016 0706   CL 99 (L) 06/11/2016 0706   CO2 29  06/11/2016 0706   GLUCOSE 86 06/11/2016 0706   BUN 26 (H) 06/11/2016 0706   CREATININE 7.41 (H) 06/11/2016 0706   CALCIUM 8.8 (L) 06/11/2016 0706   PROT 9.1 (H) 04/30/2016 1513   ALBUMIN 4.4 04/30/2016 1513   AST 18 04/30/2016 1513   ALT 9 (L) 04/30/2016 1513   ALKPHOS 49 04/30/2016 1513   BILITOT 0.7 04/30/2016 1513   GFRNONAA 7 (L) 06/11/2016 0706   GFRAA 8 (L) 06/11/2016 0706    PATHOLOGY:   RADIOGRAPHIC STUDIES: I have personally reviewed the radiological images as listed and agreed with the findings in the report. No results found.   Study Result   CLINICAL DATA:  Initial treatment strategy for multiple myeloma.  EXAM: NUCLEAR MEDICINE PET WHOLE BODY  TECHNIQUE: Seven point for mCi F-18 FDG was injected intravenously. Full-ring PET imaging was performed from the vertex to the feet after the radiotracer. CT data was obtained and used for attenuation correction and anatomic localization.  FASTING BLOOD GLUCOSE:  Value: 92 mg/dl  COMPARISON:  None.  FINDINGS: NECK  Hypermetabolic left-sided level II lymph node demonstrates SUV max = 4.3. Increased activity is identified in the left sphenoid sinus. CT images confirm changes of chronic sinusitis.  CHEST  1.5 cm short axis subcarinal lymph node is hypermetabolic with SUV max = 6.6. 1.1 cm short axis right hilar lymph node is hypermetabolic with SUV max = 5.6. Small AP window lymph nodes measure up to about 10 mm short axis and demonstrate hypermetabolic FDG accumulation. 6 mm left lower lobe pulmonary nodule visualized image 57 series 8, without discernible discrete FDG uptake on PET imaging. Biapical pleural-parenchymal scarring is evident.  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 Patient is status post right shoulder replacement with evidence of FDG accumulation in the region of the prosthesis. FDG uptake is seen in the left shoulder, consistent with a  degenerative change.  ABDOMEN/PELVIS  2.9 cm hypo attenuating lesion in the dome of the spleen shows no hypermetabolism on PET images. Kidneys are atrophic with non hypermetabolic cystic lesions noted bilaterally.  Calcified renal transplant identified left lower quadrant. Abdominal aortic atherosclerosis is evident.  SKELETON  Degenerative changes noted in the hips bilaterally, better  evaluated on prior dedicated CT imaging. Mild FDG uptake in the hips is compatible with the known bony changes. Scattered areas of subtle FDG uptake are seen in the thoracolumbar spine, associated with what appears to be Schmorl's nodes.  IMPRESSION: 1. Hypermetabolic level II left-sided cervical lymph node associated with hypermetabolic subcarinal, AP window, and right hilar lymphadenopathy. 2. No definite suspicious hypermetabolic bony lesions. 3. 6 mm left lower lobe pulmonary nodule. Attention on follow-up recommended. 4. 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 5. Hypo attenuating 2.9 cm splenic lesion without hypermetabolism. 6. Abdominal aortic atherosclerosis. 7. Degenerative changes in the left shoulder and both hips.   Electronically Signed   By: Misty Stanley M.D.   On: 07/04/2016 12:26   Study Result   CLINICAL DATA:  Lytic lesions in the femoral necks iliac crests in patient with history of end-stage renal disease. Status post bone biopsy 06/11/2016.  EXAM: CT PELVIS WITHOUT CONTRAST  TECHNIQUE: Multidetector CT imaging of the pelvis was performed following the standard protocol without intravenous contrast.  COMPARISON:  CT abdomen and pelvis 10/01/2011.  FINDINGS: Erosions are seen about the sacroiliac joints bilaterally. More extensive erosive changes also present about the femoral heads and necks, much worse on the right. Erosions are present about the SI joints and femoral necks on the prior CT scan but have  progressed since that study. Intramedullary spaces of image bones demonstrate subtle increased density. No fracture is identified. No notable degenerative change about the hips.  Marked synovial/capsular thickening is present about the hips bilaterally, much worse on the right. Volume of joint effusions is decreased since the prior CT scan. Musculature about the pelvis is intact.  There is partial visualization of a calcified left lower quadrant renal transplant. Atherosclerotic vascular disease is noted. Atrophy of the left rectus abdominis is identified.  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 Erosive change about the hips could also be due to coexistent amyloidosis. The appearance is not suggestive neoplastic process.  Decreased hip joint fusion since the prior CT scan with persist capsular thickening compatible synovitis like related to renal osteodystrophy.  Atherosclerosis.   Electronically Signed   By: Inge Rise M.D.   On: 06/14/2016 09:14    Study Result   CLINICAL DATA:  Lytic bone lesions on x-ray. Chronic renal failure on dialysis. History of failed renal transplant  EXAM: CT LUMBAR SPINE WITHOUT CONTRAST  TECHNIQUE: Multidetector CT imaging of the lumbar spine was performed without intravenous contrast administration. Multiplanar CT image reconstructions were also generated.  COMPARISON:  CT pelvis 06/13/2016  FINDINGS: Segmentation: Normal  Alignment: Normal  Vertebrae: Multiple lytic lesions are seen in the vertebral body endplates from D63 through L4. L4-5 and L5-S1 are spared. These lytic lesions communicate with the disc space and are surrounded by sclerotic change. No lesions are seen within the central portion of the vertebra. Small lytic lesions are seen around the facet joint  on the right is L2-3. Posterior elements otherwise spared. No vertebral fracture. 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 This has a similar appearance and etiology as the lytic lesions around the femoral head and neck bilaterally on the CT pelvis. Similar but less prominent lesions in the SI joints bilaterally.  Paraspinal and other soft tissues: Calcified mass in the left pelvis compatible with failed renal transplant. Atherosclerotic disease. Bilateral renal atrophy  atrophy from renal failure. Bilateral renal lesions compatible with cysts.  Disc levels: L1-2: Disc and facet degeneration. Disc bulging. Mild spinal stenosis.  L2-3: Moderate spinal stenosis. Disc bulging and facet and ligamentum flavum hypertrophy.  L3-4: Mild spinal stenosis. Disc bulging and posterior element hypertrophy.  L4-5: Disc bulging and mild facet degeneration. Subarticular stenosis bilaterally.  L5-S1: Mild disc and facet degeneration without significant stenosis.  IMPRESSION: Multiple endplate lytic lesions are seen from T11 through L4. These are associated with surrounding sclerosis and have a benign appearance. 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 Similar lesions are seen in the SI joints and in the hips bilaterally.  Bilateral renal atrophy and calcified failed renal transplant in the left pelvis.  Multilevel degenerative change and spinal stenosis due to disc and facet degeneration.   Electronically Signed   By: Franchot Gallo M.D.   On: 06/14/2016 06:57     ASSESSMENT & PLAN:  MGUS End stage renal disease, on hemodialysis Renal osteodystrophy Abnormal PET imaging of chest  He has evidence of renal osteodystrophy. BMBX has been repeated, plasma cells are 9% by aspirate and 15% by CD 138. Renal disease and HX renal transplant is from HTN. PET is without evidence of bone  disease. Noted adenopathy in subcarinal and R hilar area will need further evaluation. Patient is a NEVER smoker.   Will discuss with Dr. Luan Pulling to see if he can obtain biopsy of subcarinal node. If not will see if Dr. Roxan Hockey can review. Will ask pathology to add congo red staining. If positive -- refer to Dr. Tiana Loft at [facility].  Tentatively, follow up with patient in 2 months.  ORDERS PLACED FOR THIS ENCOUNTER: No orders of the defined types were placed in this encounter.  Orders Placed This Encounter  Procedures  . Protein electrophoresis, serum    Standing Status:   Future    Standing Expiration Date:   07/09/2017  . Immunofixation electrophoresis    Standing Status:   Future    Standing Expiration Date:   07/09/2017  . Kappa/lambda light chains    Standing Status:   Future    Standing Expiration Date:   07/09/2017  . IgG, IgA, IgM    Standing Status:   Future    Standing Expiration Date:   07/09/2017  . CBC with Differential    Standing Status:   Future    Standing Expiration Date:   07/09/2017  . Comprehensive metabolic panel    Standing Status:   Future    Standing Expiration Date:   07/09/2017    All questions were answered. The patient knows to call the clinic with any problems, questions or concerns.  This document serves as a record of services personally performed by Ancil Linsey, MD. It was created on her behalf by Elmyra Ricks, a trained medical scribe. The creation of this record is based on the scribe's personal observations and the provider's statements to them. This document has been checked and approved by the attending provider.  I have reviewed the above documentation for accuracy and completeness and I agree with the above.  This note was electronically signed.   Molli Hazard, MD  07/09/2016 9:35 AM

## 2016-07-10 ENCOUNTER — Encounter (HOSPITAL_COMMUNITY): Payer: Self-pay | Admitting: Hematology & Oncology

## 2016-07-10 DIAGNOSIS — D509 Iron deficiency anemia, unspecified: Secondary | ICD-10-CM | POA: Diagnosis not present

## 2016-07-10 DIAGNOSIS — N186 End stage renal disease: Secondary | ICD-10-CM | POA: Diagnosis not present

## 2016-07-10 DIAGNOSIS — Z992 Dependence on renal dialysis: Secondary | ICD-10-CM | POA: Diagnosis not present

## 2016-07-10 DIAGNOSIS — D472 Monoclonal gammopathy: Secondary | ICD-10-CM | POA: Diagnosis not present

## 2016-07-10 DIAGNOSIS — N2581 Secondary hyperparathyroidism of renal origin: Secondary | ICD-10-CM | POA: Diagnosis not present

## 2016-07-10 DIAGNOSIS — D631 Anemia in chronic kidney disease: Secondary | ICD-10-CM | POA: Diagnosis not present

## 2016-07-12 DIAGNOSIS — N186 End stage renal disease: Secondary | ICD-10-CM | POA: Diagnosis not present

## 2016-07-12 DIAGNOSIS — Z992 Dependence on renal dialysis: Secondary | ICD-10-CM | POA: Diagnosis not present

## 2016-07-12 DIAGNOSIS — D509 Iron deficiency anemia, unspecified: Secondary | ICD-10-CM | POA: Diagnosis not present

## 2016-07-12 DIAGNOSIS — D472 Monoclonal gammopathy: Secondary | ICD-10-CM | POA: Diagnosis not present

## 2016-07-12 DIAGNOSIS — N2581 Secondary hyperparathyroidism of renal origin: Secondary | ICD-10-CM | POA: Diagnosis not present

## 2016-07-12 DIAGNOSIS — D631 Anemia in chronic kidney disease: Secondary | ICD-10-CM | POA: Diagnosis not present

## 2016-07-15 DIAGNOSIS — N186 End stage renal disease: Secondary | ICD-10-CM | POA: Diagnosis not present

## 2016-07-15 DIAGNOSIS — D509 Iron deficiency anemia, unspecified: Secondary | ICD-10-CM | POA: Diagnosis not present

## 2016-07-15 DIAGNOSIS — N2581 Secondary hyperparathyroidism of renal origin: Secondary | ICD-10-CM | POA: Diagnosis not present

## 2016-07-15 DIAGNOSIS — D472 Monoclonal gammopathy: Secondary | ICD-10-CM | POA: Diagnosis not present

## 2016-07-15 DIAGNOSIS — D631 Anemia in chronic kidney disease: Secondary | ICD-10-CM | POA: Diagnosis not present

## 2016-07-15 DIAGNOSIS — Z992 Dependence on renal dialysis: Secondary | ICD-10-CM | POA: Diagnosis not present

## 2016-07-17 DIAGNOSIS — N186 End stage renal disease: Secondary | ICD-10-CM | POA: Diagnosis not present

## 2016-07-17 DIAGNOSIS — D509 Iron deficiency anemia, unspecified: Secondary | ICD-10-CM | POA: Diagnosis not present

## 2016-07-17 DIAGNOSIS — D472 Monoclonal gammopathy: Secondary | ICD-10-CM | POA: Diagnosis not present

## 2016-07-17 DIAGNOSIS — N2581 Secondary hyperparathyroidism of renal origin: Secondary | ICD-10-CM | POA: Diagnosis not present

## 2016-07-17 DIAGNOSIS — Z992 Dependence on renal dialysis: Secondary | ICD-10-CM | POA: Diagnosis not present

## 2016-07-17 DIAGNOSIS — D631 Anemia in chronic kidney disease: Secondary | ICD-10-CM | POA: Diagnosis not present

## 2016-07-19 DIAGNOSIS — N2581 Secondary hyperparathyroidism of renal origin: Secondary | ICD-10-CM | POA: Diagnosis not present

## 2016-07-19 DIAGNOSIS — N186 End stage renal disease: Secondary | ICD-10-CM | POA: Diagnosis not present

## 2016-07-19 DIAGNOSIS — Z992 Dependence on renal dialysis: Secondary | ICD-10-CM | POA: Diagnosis not present

## 2016-07-19 DIAGNOSIS — D509 Iron deficiency anemia, unspecified: Secondary | ICD-10-CM | POA: Diagnosis not present

## 2016-07-19 DIAGNOSIS — D472 Monoclonal gammopathy: Secondary | ICD-10-CM | POA: Diagnosis not present

## 2016-07-19 DIAGNOSIS — D631 Anemia in chronic kidney disease: Secondary | ICD-10-CM | POA: Diagnosis not present

## 2016-07-22 DIAGNOSIS — D472 Monoclonal gammopathy: Secondary | ICD-10-CM | POA: Diagnosis not present

## 2016-07-22 DIAGNOSIS — D631 Anemia in chronic kidney disease: Secondary | ICD-10-CM | POA: Diagnosis not present

## 2016-07-22 DIAGNOSIS — N2581 Secondary hyperparathyroidism of renal origin: Secondary | ICD-10-CM | POA: Diagnosis not present

## 2016-07-22 DIAGNOSIS — D509 Iron deficiency anemia, unspecified: Secondary | ICD-10-CM | POA: Diagnosis not present

## 2016-07-22 DIAGNOSIS — N186 End stage renal disease: Secondary | ICD-10-CM | POA: Diagnosis not present

## 2016-07-22 DIAGNOSIS — Z992 Dependence on renal dialysis: Secondary | ICD-10-CM | POA: Diagnosis not present

## 2016-07-24 DIAGNOSIS — Z992 Dependence on renal dialysis: Secondary | ICD-10-CM | POA: Diagnosis not present

## 2016-07-24 DIAGNOSIS — D631 Anemia in chronic kidney disease: Secondary | ICD-10-CM | POA: Diagnosis not present

## 2016-07-24 DIAGNOSIS — D472 Monoclonal gammopathy: Secondary | ICD-10-CM | POA: Diagnosis not present

## 2016-07-24 DIAGNOSIS — N186 End stage renal disease: Secondary | ICD-10-CM | POA: Diagnosis not present

## 2016-07-24 DIAGNOSIS — D509 Iron deficiency anemia, unspecified: Secondary | ICD-10-CM | POA: Diagnosis not present

## 2016-07-24 DIAGNOSIS — N2581 Secondary hyperparathyroidism of renal origin: Secondary | ICD-10-CM | POA: Diagnosis not present

## 2016-07-25 DIAGNOSIS — H25811 Combined forms of age-related cataract, right eye: Secondary | ICD-10-CM | POA: Diagnosis not present

## 2016-07-25 DIAGNOSIS — H401133 Primary open-angle glaucoma, bilateral, severe stage: Secondary | ICD-10-CM | POA: Diagnosis not present

## 2016-07-26 ENCOUNTER — Encounter (HOSPITAL_COMMUNITY): Payer: Self-pay | Admitting: Hematology & Oncology

## 2016-07-26 DIAGNOSIS — Z992 Dependence on renal dialysis: Secondary | ICD-10-CM | POA: Diagnosis not present

## 2016-07-26 DIAGNOSIS — N2581 Secondary hyperparathyroidism of renal origin: Secondary | ICD-10-CM | POA: Diagnosis not present

## 2016-07-26 DIAGNOSIS — D509 Iron deficiency anemia, unspecified: Secondary | ICD-10-CM | POA: Diagnosis not present

## 2016-07-26 DIAGNOSIS — D631 Anemia in chronic kidney disease: Secondary | ICD-10-CM | POA: Diagnosis not present

## 2016-07-26 DIAGNOSIS — D472 Monoclonal gammopathy: Secondary | ICD-10-CM | POA: Diagnosis not present

## 2016-07-26 DIAGNOSIS — N186 End stage renal disease: Secondary | ICD-10-CM | POA: Diagnosis not present

## 2016-07-28 DIAGNOSIS — D631 Anemia in chronic kidney disease: Secondary | ICD-10-CM | POA: Diagnosis not present

## 2016-07-28 DIAGNOSIS — N2581 Secondary hyperparathyroidism of renal origin: Secondary | ICD-10-CM | POA: Diagnosis not present

## 2016-07-28 DIAGNOSIS — N186 End stage renal disease: Secondary | ICD-10-CM | POA: Diagnosis not present

## 2016-07-28 DIAGNOSIS — D509 Iron deficiency anemia, unspecified: Secondary | ICD-10-CM | POA: Diagnosis not present

## 2016-07-28 DIAGNOSIS — D472 Monoclonal gammopathy: Secondary | ICD-10-CM | POA: Diagnosis not present

## 2016-07-28 DIAGNOSIS — Z992 Dependence on renal dialysis: Secondary | ICD-10-CM | POA: Diagnosis not present

## 2016-07-31 DIAGNOSIS — N186 End stage renal disease: Secondary | ICD-10-CM | POA: Diagnosis not present

## 2016-07-31 DIAGNOSIS — I10 Essential (primary) hypertension: Secondary | ICD-10-CM | POA: Diagnosis not present

## 2016-07-31 DIAGNOSIS — N2581 Secondary hyperparathyroidism of renal origin: Secondary | ICD-10-CM | POA: Diagnosis not present

## 2016-07-31 DIAGNOSIS — E7800 Pure hypercholesterolemia, unspecified: Secondary | ICD-10-CM | POA: Diagnosis not present

## 2016-07-31 DIAGNOSIS — D509 Iron deficiency anemia, unspecified: Secondary | ICD-10-CM | POA: Diagnosis not present

## 2016-07-31 DIAGNOSIS — Z992 Dependence on renal dialysis: Secondary | ICD-10-CM | POA: Diagnosis not present

## 2016-07-31 DIAGNOSIS — D472 Monoclonal gammopathy: Secondary | ICD-10-CM | POA: Diagnosis not present

## 2016-07-31 DIAGNOSIS — D631 Anemia in chronic kidney disease: Secondary | ICD-10-CM | POA: Diagnosis not present

## 2016-08-02 DIAGNOSIS — Z1389 Encounter for screening for other disorder: Secondary | ICD-10-CM | POA: Diagnosis not present

## 2016-08-02 DIAGNOSIS — D631 Anemia in chronic kidney disease: Secondary | ICD-10-CM | POA: Diagnosis not present

## 2016-08-02 DIAGNOSIS — N2581 Secondary hyperparathyroidism of renal origin: Secondary | ICD-10-CM | POA: Diagnosis not present

## 2016-08-02 DIAGNOSIS — Z7189 Other specified counseling: Secondary | ICD-10-CM | POA: Diagnosis not present

## 2016-08-02 DIAGNOSIS — Z299 Encounter for prophylactic measures, unspecified: Secondary | ICD-10-CM | POA: Diagnosis not present

## 2016-08-02 DIAGNOSIS — Z1211 Encounter for screening for malignant neoplasm of colon: Secondary | ICD-10-CM | POA: Diagnosis not present

## 2016-08-02 DIAGNOSIS — D472 Monoclonal gammopathy: Secondary | ICD-10-CM | POA: Diagnosis not present

## 2016-08-02 DIAGNOSIS — Z0000 Encounter for general adult medical examination without abnormal findings: Secondary | ICD-10-CM | POA: Diagnosis not present

## 2016-08-02 DIAGNOSIS — N186 End stage renal disease: Secondary | ICD-10-CM | POA: Diagnosis not present

## 2016-08-02 DIAGNOSIS — Z992 Dependence on renal dialysis: Secondary | ICD-10-CM | POA: Diagnosis not present

## 2016-08-02 DIAGNOSIS — D509 Iron deficiency anemia, unspecified: Secondary | ICD-10-CM | POA: Diagnosis not present

## 2016-08-04 DIAGNOSIS — D472 Monoclonal gammopathy: Secondary | ICD-10-CM | POA: Diagnosis not present

## 2016-08-04 DIAGNOSIS — D509 Iron deficiency anemia, unspecified: Secondary | ICD-10-CM | POA: Diagnosis not present

## 2016-08-04 DIAGNOSIS — N2581 Secondary hyperparathyroidism of renal origin: Secondary | ICD-10-CM | POA: Diagnosis not present

## 2016-08-04 DIAGNOSIS — N186 End stage renal disease: Secondary | ICD-10-CM | POA: Diagnosis not present

## 2016-08-04 DIAGNOSIS — Z992 Dependence on renal dialysis: Secondary | ICD-10-CM | POA: Diagnosis not present

## 2016-08-04 DIAGNOSIS — D631 Anemia in chronic kidney disease: Secondary | ICD-10-CM | POA: Diagnosis not present

## 2016-08-07 DIAGNOSIS — N2581 Secondary hyperparathyroidism of renal origin: Secondary | ICD-10-CM | POA: Diagnosis not present

## 2016-08-07 DIAGNOSIS — N186 End stage renal disease: Secondary | ICD-10-CM | POA: Diagnosis not present

## 2016-08-07 DIAGNOSIS — D509 Iron deficiency anemia, unspecified: Secondary | ICD-10-CM | POA: Diagnosis not present

## 2016-08-07 DIAGNOSIS — D631 Anemia in chronic kidney disease: Secondary | ICD-10-CM | POA: Diagnosis not present

## 2016-08-07 DIAGNOSIS — D472 Monoclonal gammopathy: Secondary | ICD-10-CM | POA: Diagnosis not present

## 2016-08-07 DIAGNOSIS — Z992 Dependence on renal dialysis: Secondary | ICD-10-CM | POA: Diagnosis not present

## 2016-08-09 DIAGNOSIS — D631 Anemia in chronic kidney disease: Secondary | ICD-10-CM | POA: Diagnosis not present

## 2016-08-09 DIAGNOSIS — Z992 Dependence on renal dialysis: Secondary | ICD-10-CM | POA: Diagnosis not present

## 2016-08-09 DIAGNOSIS — N2581 Secondary hyperparathyroidism of renal origin: Secondary | ICD-10-CM | POA: Diagnosis not present

## 2016-08-09 DIAGNOSIS — D472 Monoclonal gammopathy: Secondary | ICD-10-CM | POA: Diagnosis not present

## 2016-08-09 DIAGNOSIS — D509 Iron deficiency anemia, unspecified: Secondary | ICD-10-CM | POA: Diagnosis not present

## 2016-08-09 DIAGNOSIS — N186 End stage renal disease: Secondary | ICD-10-CM | POA: Diagnosis not present

## 2016-08-12 DIAGNOSIS — H401113 Primary open-angle glaucoma, right eye, severe stage: Secondary | ICD-10-CM | POA: Diagnosis not present

## 2016-08-12 DIAGNOSIS — H25811 Combined forms of age-related cataract, right eye: Secondary | ICD-10-CM | POA: Diagnosis not present

## 2016-08-12 DIAGNOSIS — H2511 Age-related nuclear cataract, right eye: Secondary | ICD-10-CM | POA: Diagnosis not present

## 2016-08-12 DIAGNOSIS — H401133 Primary open-angle glaucoma, bilateral, severe stage: Secondary | ICD-10-CM | POA: Diagnosis not present

## 2016-08-14 DIAGNOSIS — D509 Iron deficiency anemia, unspecified: Secondary | ICD-10-CM | POA: Diagnosis not present

## 2016-08-14 DIAGNOSIS — Z992 Dependence on renal dialysis: Secondary | ICD-10-CM | POA: Diagnosis not present

## 2016-08-14 DIAGNOSIS — D631 Anemia in chronic kidney disease: Secondary | ICD-10-CM | POA: Diagnosis not present

## 2016-08-14 DIAGNOSIS — N2581 Secondary hyperparathyroidism of renal origin: Secondary | ICD-10-CM | POA: Diagnosis not present

## 2016-08-14 DIAGNOSIS — D472 Monoclonal gammopathy: Secondary | ICD-10-CM | POA: Diagnosis not present

## 2016-08-14 DIAGNOSIS — N186 End stage renal disease: Secondary | ICD-10-CM | POA: Diagnosis not present

## 2016-08-16 DIAGNOSIS — D472 Monoclonal gammopathy: Secondary | ICD-10-CM | POA: Diagnosis not present

## 2016-08-16 DIAGNOSIS — N186 End stage renal disease: Secondary | ICD-10-CM | POA: Diagnosis not present

## 2016-08-16 DIAGNOSIS — D631 Anemia in chronic kidney disease: Secondary | ICD-10-CM | POA: Diagnosis not present

## 2016-08-16 DIAGNOSIS — N2581 Secondary hyperparathyroidism of renal origin: Secondary | ICD-10-CM | POA: Diagnosis not present

## 2016-08-16 DIAGNOSIS — D509 Iron deficiency anemia, unspecified: Secondary | ICD-10-CM | POA: Diagnosis not present

## 2016-08-16 DIAGNOSIS — Z992 Dependence on renal dialysis: Secondary | ICD-10-CM | POA: Diagnosis not present

## 2016-08-19 DIAGNOSIS — N2581 Secondary hyperparathyroidism of renal origin: Secondary | ICD-10-CM | POA: Diagnosis not present

## 2016-08-19 DIAGNOSIS — D509 Iron deficiency anemia, unspecified: Secondary | ICD-10-CM | POA: Diagnosis not present

## 2016-08-19 DIAGNOSIS — Z992 Dependence on renal dialysis: Secondary | ICD-10-CM | POA: Diagnosis not present

## 2016-08-19 DIAGNOSIS — N186 End stage renal disease: Secondary | ICD-10-CM | POA: Diagnosis not present

## 2016-08-19 DIAGNOSIS — D631 Anemia in chronic kidney disease: Secondary | ICD-10-CM | POA: Diagnosis not present

## 2016-08-19 DIAGNOSIS — D472 Monoclonal gammopathy: Secondary | ICD-10-CM | POA: Diagnosis not present

## 2016-08-21 DIAGNOSIS — Z992 Dependence on renal dialysis: Secondary | ICD-10-CM | POA: Diagnosis not present

## 2016-08-21 DIAGNOSIS — N186 End stage renal disease: Secondary | ICD-10-CM | POA: Diagnosis not present

## 2016-08-21 DIAGNOSIS — D631 Anemia in chronic kidney disease: Secondary | ICD-10-CM | POA: Diagnosis not present

## 2016-08-21 DIAGNOSIS — N2581 Secondary hyperparathyroidism of renal origin: Secondary | ICD-10-CM | POA: Diagnosis not present

## 2016-08-21 DIAGNOSIS — D509 Iron deficiency anemia, unspecified: Secondary | ICD-10-CM | POA: Diagnosis not present

## 2016-08-21 DIAGNOSIS — D472 Monoclonal gammopathy: Secondary | ICD-10-CM | POA: Diagnosis not present

## 2016-08-23 DIAGNOSIS — D472 Monoclonal gammopathy: Secondary | ICD-10-CM | POA: Diagnosis not present

## 2016-08-23 DIAGNOSIS — N186 End stage renal disease: Secondary | ICD-10-CM | POA: Diagnosis not present

## 2016-08-23 DIAGNOSIS — N2581 Secondary hyperparathyroidism of renal origin: Secondary | ICD-10-CM | POA: Diagnosis not present

## 2016-08-23 DIAGNOSIS — Z992 Dependence on renal dialysis: Secondary | ICD-10-CM | POA: Diagnosis not present

## 2016-08-23 DIAGNOSIS — D509 Iron deficiency anemia, unspecified: Secondary | ICD-10-CM | POA: Diagnosis not present

## 2016-08-23 DIAGNOSIS — D631 Anemia in chronic kidney disease: Secondary | ICD-10-CM | POA: Diagnosis not present

## 2016-08-26 DIAGNOSIS — D631 Anemia in chronic kidney disease: Secondary | ICD-10-CM | POA: Diagnosis not present

## 2016-08-26 DIAGNOSIS — N186 End stage renal disease: Secondary | ICD-10-CM | POA: Diagnosis not present

## 2016-08-26 DIAGNOSIS — D509 Iron deficiency anemia, unspecified: Secondary | ICD-10-CM | POA: Diagnosis not present

## 2016-08-26 DIAGNOSIS — Z992 Dependence on renal dialysis: Secondary | ICD-10-CM | POA: Diagnosis not present

## 2016-08-26 DIAGNOSIS — N2581 Secondary hyperparathyroidism of renal origin: Secondary | ICD-10-CM | POA: Diagnosis not present

## 2016-08-26 DIAGNOSIS — D472 Monoclonal gammopathy: Secondary | ICD-10-CM | POA: Diagnosis not present

## 2016-08-28 DIAGNOSIS — D631 Anemia in chronic kidney disease: Secondary | ICD-10-CM | POA: Diagnosis not present

## 2016-08-28 DIAGNOSIS — D472 Monoclonal gammopathy: Secondary | ICD-10-CM | POA: Diagnosis not present

## 2016-08-28 DIAGNOSIS — Z992 Dependence on renal dialysis: Secondary | ICD-10-CM | POA: Diagnosis not present

## 2016-08-28 DIAGNOSIS — N2581 Secondary hyperparathyroidism of renal origin: Secondary | ICD-10-CM | POA: Diagnosis not present

## 2016-08-28 DIAGNOSIS — D509 Iron deficiency anemia, unspecified: Secondary | ICD-10-CM | POA: Diagnosis not present

## 2016-08-28 DIAGNOSIS — N186 End stage renal disease: Secondary | ICD-10-CM | POA: Diagnosis not present

## 2016-08-29 ENCOUNTER — Encounter: Payer: Self-pay | Admitting: Cardiovascular Disease

## 2016-08-29 ENCOUNTER — Ambulatory Visit (INDEPENDENT_AMBULATORY_CARE_PROVIDER_SITE_OTHER): Payer: Medicare Other | Admitting: Cardiovascular Disease

## 2016-08-29 VITALS — BP 118/82 | HR 74 | Ht 73.0 in | Wt 145.0 lb

## 2016-08-29 DIAGNOSIS — I272 Pulmonary hypertension, unspecified: Secondary | ICD-10-CM | POA: Diagnosis not present

## 2016-08-29 DIAGNOSIS — I519 Heart disease, unspecified: Secondary | ICD-10-CM

## 2016-08-29 DIAGNOSIS — I10 Essential (primary) hypertension: Secondary | ICD-10-CM | POA: Diagnosis not present

## 2016-08-29 DIAGNOSIS — I5189 Other ill-defined heart diseases: Secondary | ICD-10-CM

## 2016-08-29 DIAGNOSIS — N186 End stage renal disease: Secondary | ICD-10-CM

## 2016-08-29 DIAGNOSIS — Z7189 Other specified counseling: Secondary | ICD-10-CM

## 2016-08-29 DIAGNOSIS — I482 Chronic atrial fibrillation, unspecified: Secondary | ICD-10-CM

## 2016-08-29 NOTE — Progress Notes (Signed)
SUBJECTIVE: The patient presents for routine follow-up. He reportedly has a history of hypertensive heart disease, chronic atrial fibrillation, and severe pulmonary hypertension secondary to severe diastolic dysfunction. He also has end-stage renal disease and is on dialysis.  Echocardiogram in February 2013 demonstrated normal left ventricular systolic function, LVEF 53-29%, moderate concentric LVH, restrictive diastolic physiology, severe pulmonary hypertension, mild mitral and mild to moderate tricuspid regurgitation.  ECG today shows atrial fibrillation with a diffuse T-wave abnormality.  He feels well overall. He denies chest pain. He seldom has exertional dyspnea.  CBC 06/11/16: Hemoglobin 11.6, platelets 141.  Review of Systems: As per "subjective", otherwise negative.  No Known Allergies  Current Outpatient Prescriptions  Medication Sig Dispense Refill  . AZOPT 1 % ophthalmic suspension   4  . bimatoprost (LUMIGAN) 0.03 % ophthalmic solution 1 drop.    . cinacalcet (SENSIPAR) 30 MG tablet Take 30 mg by mouth.    . folic acid (FOLVITE) 1 MG tablet Take 1 mg by mouth daily.    [name] gabapentin (NEURONTIN) 300 MG capsule Take 1 capsule (300 mg total) by mouth 3 (three) times daily. 300 capsule 3  . labetalol (NORMODYNE) 100 MG tablet TAKE 1 BY MOUTH TWO TIMES  DAILY 180 tablet 0  . lanthanum (FOSRENOL) 500 MG chewable tablet Chew 1,000 mg by mouth 3 (three) times daily with meals. And 1 with snacks    . latanoprost (XALATAN) 0.005 % ophthalmic solution Place 1 drop into both eyes at bedtime.  6  . levobunolol (BETAGAN) 0.5 % ophthalmic solution     . levothyroxine (SYNTHROID, LEVOTHROID) 137 MCG tablet Take 137 mcg by mouth daily before breakfast.    . minoxidil (LONITEN) 2.5 MG tablet     . Multiple Vitamin (DAILY VITE) TABS Take 1 tablet by mouth daily.     [name] SIMBRINZA 1-0.2 % SUSP INSTILL 1 DROP IN INTO BOTH EYES TWICE A DAY  6  . simvastatin (ZOCOR) 20 MG tablet Take 20 mg  by mouth daily with supper.      [name] UNABLE TO FIND Take 1 tablet by mouth. Dailyvite 800 mg     No current facility-administered medications for this visit.     Past Medical History:  Diagnosis Date  . Atrial fibrillation (HCC)    Permanent, Not on Coumadin because of history of lower GI bleed  . Diastolic dysfunction    Restrictive  diastolic filling pattern echo,  February, 2013  . Dyslipidemia   . Ejection fraction    EF 60-65%, echo, 2013  . ESRD (end stage renal disease) ([place])    Dialysis, fistula in left arm  . Hypertension   . Lower gastrointestinal bleed   . LVH (left ventricular hypertrophy)   . 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 Mitral regurgitation    Mild by echo, 2013  . Pancytopenia ([place])    Dr Ignacia Bayley  . Pulmonary hypertension    Severe, PA pressure 65-70 mm of mercury, echo, 2013  . Tobacco abuse     Past Surgical History:  Procedure Laterality Date  . AV FISTULA PLACEMENT Left 1992   Left arm   . KIDNEY TRANSPLANT     failed transplant after 7 years, placed at [place] [facility]  . Marital status: Widowed    Spouse name: N/A  . Number of children: N/A  . Years of education: N/A   Occupational History  . Not on  file.   Social History Main Topics  . Smoking status: Never Smoker  . Smokeless tobacco: Never Used  . Alcohol use No  . Drug use: No  . Sexual activity: Not on file   Other Topics Concern  . Not on file   Social History Narrative   Lives alone in a one story home.  Has 2 sons.     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     Education high school.      Vitals:   08/29/16 0819  BP: 118/82  Pulse: 74  SpO2: 97%  Weight: 145 lb (65.8 kg)  Height: 6\' 1"  (1.854 m)    PHYSICAL EXAM General: NAD HEENT: Normal. Neck: No JVD, no thyromegaly. Lungs: Clear to auscultation bilaterally with normal respiratory effort. CV:  Nondisplaced PMI.  Regular rate and rhythm, normal S1/S2, no K0/S8, 3/6 holosystolic murmur heard along left sternal border. No pretibial or periankle edema. Left arm AV fistula.  Abdomen: Soft, nontender, no distention.  Neurologic: Alert and oriented x 3.  Psych: Normal affect. Skin: Normal. Musculoskeletal: No gross deformities. Extremities: No clubbing or cyanosis.     ECG: Most recent ECG reviewed.      ASSESSMENT AND PLAN: 1. Chronic atrial fibrillation: Reportedly unable to be anticoagulated due to h/o GI bleeding. He said this was several years ago. CBC 06/11/16: Hemoglobin 11.6, platelets 141. I talked to him about potentially instituting warfarin. He would like to think about it. He would need to see GI first. HR controlled on labetalol.  2. Severe diastolic dysfunction/pulmonary hypertension: BP controlled. Volume managed with dialysis.  3. ESRD on dialysis.  4. Essential HTN: Controlled. No changes.  Dispo: f/u 1 year.   Kate Sable, M.D., F.A.C.C.

## 2016-08-29 NOTE — Patient Instructions (Signed)

## 2016-08-30 DIAGNOSIS — Z992 Dependence on renal dialysis: Secondary | ICD-10-CM | POA: Diagnosis not present

## 2016-08-30 DIAGNOSIS — D509 Iron deficiency anemia, unspecified: Secondary | ICD-10-CM | POA: Diagnosis not present

## 2016-08-30 DIAGNOSIS — D472 Monoclonal gammopathy: Secondary | ICD-10-CM | POA: Diagnosis not present

## 2016-08-30 DIAGNOSIS — D631 Anemia in chronic kidney disease: Secondary | ICD-10-CM | POA: Diagnosis not present

## 2016-08-30 DIAGNOSIS — N186 End stage renal disease: Secondary | ICD-10-CM | POA: Diagnosis not present

## 2016-08-30 DIAGNOSIS — N2581 Secondary hyperparathyroidism of renal origin: Secondary | ICD-10-CM | POA: Diagnosis not present

## 2016-09-01 ENCOUNTER — Encounter (HOSPITAL_COMMUNITY): Payer: Self-pay | Admitting: Hematology & Oncology

## 2016-09-02 DIAGNOSIS — N186 End stage renal disease: Secondary | ICD-10-CM | POA: Diagnosis not present

## 2016-09-02 DIAGNOSIS — D509 Iron deficiency anemia, unspecified: Secondary | ICD-10-CM | POA: Diagnosis not present

## 2016-09-02 DIAGNOSIS — N2581 Secondary hyperparathyroidism of renal origin: Secondary | ICD-10-CM | POA: Diagnosis not present

## 2016-09-02 DIAGNOSIS — D472 Monoclonal gammopathy: Secondary | ICD-10-CM | POA: Diagnosis not present

## 2016-09-02 DIAGNOSIS — D631 Anemia in chronic kidney disease: Secondary | ICD-10-CM | POA: Diagnosis not present

## 2016-09-02 DIAGNOSIS — Z992 Dependence on renal dialysis: Secondary | ICD-10-CM | POA: Diagnosis not present

## 2016-09-04 DIAGNOSIS — N2581 Secondary hyperparathyroidism of renal origin: Secondary | ICD-10-CM | POA: Diagnosis not present

## 2016-09-04 DIAGNOSIS — Z992 Dependence on renal dialysis: Secondary | ICD-10-CM | POA: Diagnosis not present

## 2016-09-04 DIAGNOSIS — D472 Monoclonal gammopathy: Secondary | ICD-10-CM | POA: Diagnosis not present

## 2016-09-04 DIAGNOSIS — D631 Anemia in chronic kidney disease: Secondary | ICD-10-CM | POA: Diagnosis not present

## 2016-09-04 DIAGNOSIS — D509 Iron deficiency anemia, unspecified: Secondary | ICD-10-CM | POA: Diagnosis not present

## 2016-09-04 DIAGNOSIS — N186 End stage renal disease: Secondary | ICD-10-CM | POA: Diagnosis not present

## 2016-09-05 ENCOUNTER — Other Ambulatory Visit (HOSPITAL_COMMUNITY): Payer: Self-pay | Admitting: Oncology

## 2016-09-06 ENCOUNTER — Encounter: Payer: Self-pay | Admitting: Internal Medicine

## 2016-09-06 ENCOUNTER — Encounter (HOSPITAL_COMMUNITY): Payer: Self-pay | Admitting: Lab

## 2016-09-06 DIAGNOSIS — D472 Monoclonal gammopathy: Secondary | ICD-10-CM | POA: Diagnosis not present

## 2016-09-06 DIAGNOSIS — N2581 Secondary hyperparathyroidism of renal origin: Secondary | ICD-10-CM | POA: Diagnosis not present

## 2016-09-06 DIAGNOSIS — D631 Anemia in chronic kidney disease: Secondary | ICD-10-CM | POA: Diagnosis not present

## 2016-09-06 DIAGNOSIS — Z992 Dependence on renal dialysis: Secondary | ICD-10-CM | POA: Diagnosis not present

## 2016-09-06 DIAGNOSIS — N186 End stage renal disease: Secondary | ICD-10-CM | POA: Diagnosis not present

## 2016-09-06 DIAGNOSIS — D509 Iron deficiency anemia, unspecified: Secondary | ICD-10-CM | POA: Diagnosis not present

## 2016-09-06 NOTE — Progress Notes (Unsigned)
Referral to [facility] Dr Tiana Loft appt 2/26 @100 .  Patient aware of appt.  They will send him info about the appt.

## 2016-09-09 ENCOUNTER — Ambulatory Visit (HOSPITAL_COMMUNITY): Payer: Medicare Other | Admitting: Hematology & Oncology

## 2016-09-09 ENCOUNTER — Other Ambulatory Visit (HOSPITAL_COMMUNITY): Payer: Medicare Other

## 2016-09-09 DIAGNOSIS — D472 Monoclonal gammopathy: Secondary | ICD-10-CM | POA: Diagnosis not present

## 2016-09-09 DIAGNOSIS — D509 Iron deficiency anemia, unspecified: Secondary | ICD-10-CM | POA: Diagnosis not present

## 2016-09-09 DIAGNOSIS — Z992 Dependence on renal dialysis: Secondary | ICD-10-CM | POA: Diagnosis not present

## 2016-09-09 DIAGNOSIS — D631 Anemia in chronic kidney disease: Secondary | ICD-10-CM | POA: Diagnosis not present

## 2016-09-09 DIAGNOSIS — N186 End stage renal disease: Secondary | ICD-10-CM | POA: Diagnosis not present

## 2016-09-09 DIAGNOSIS — N2581 Secondary hyperparathyroidism of renal origin: Secondary | ICD-10-CM | POA: Diagnosis not present

## 2016-09-10 ENCOUNTER — Ambulatory Visit (HOSPITAL_COMMUNITY): Payer: Medicare Other | Admitting: Hematology & Oncology

## 2016-09-10 ENCOUNTER — Other Ambulatory Visit (HOSPITAL_COMMUNITY): Payer: Medicare Other

## 2016-09-10 DIAGNOSIS — D72822 Plasmacytosis: Secondary | ICD-10-CM | POA: Diagnosis not present

## 2016-09-10 DIAGNOSIS — D696 Thrombocytopenia, unspecified: Secondary | ICD-10-CM | POA: Diagnosis not present

## 2016-09-11 DIAGNOSIS — N2581 Secondary hyperparathyroidism of renal origin: Secondary | ICD-10-CM | POA: Diagnosis not present

## 2016-09-11 DIAGNOSIS — N186 End stage renal disease: Secondary | ICD-10-CM | POA: Diagnosis not present

## 2016-09-11 DIAGNOSIS — D472 Monoclonal gammopathy: Secondary | ICD-10-CM | POA: Diagnosis not present

## 2016-09-11 DIAGNOSIS — Z992 Dependence on renal dialysis: Secondary | ICD-10-CM | POA: Diagnosis not present

## 2016-09-11 DIAGNOSIS — D631 Anemia in chronic kidney disease: Secondary | ICD-10-CM | POA: Diagnosis not present

## 2016-09-11 DIAGNOSIS — D509 Iron deficiency anemia, unspecified: Secondary | ICD-10-CM | POA: Diagnosis not present

## 2016-09-13 DIAGNOSIS — Z992 Dependence on renal dialysis: Secondary | ICD-10-CM | POA: Diagnosis not present

## 2016-09-13 DIAGNOSIS — D631 Anemia in chronic kidney disease: Secondary | ICD-10-CM | POA: Diagnosis not present

## 2016-09-13 DIAGNOSIS — N2581 Secondary hyperparathyroidism of renal origin: Secondary | ICD-10-CM | POA: Diagnosis not present

## 2016-09-13 DIAGNOSIS — D509 Iron deficiency anemia, unspecified: Secondary | ICD-10-CM | POA: Diagnosis not present

## 2016-09-13 DIAGNOSIS — D472 Monoclonal gammopathy: Secondary | ICD-10-CM | POA: Diagnosis not present

## 2016-09-13 DIAGNOSIS — N186 End stage renal disease: Secondary | ICD-10-CM | POA: Diagnosis not present

## 2016-09-16 DIAGNOSIS — Z992 Dependence on renal dialysis: Secondary | ICD-10-CM | POA: Diagnosis not present

## 2016-09-16 DIAGNOSIS — N186 End stage renal disease: Secondary | ICD-10-CM | POA: Diagnosis not present

## 2016-09-16 DIAGNOSIS — D631 Anemia in chronic kidney disease: Secondary | ICD-10-CM | POA: Diagnosis not present

## 2016-09-16 DIAGNOSIS — D472 Monoclonal gammopathy: Secondary | ICD-10-CM | POA: Diagnosis not present

## 2016-09-16 DIAGNOSIS — N2581 Secondary hyperparathyroidism of renal origin: Secondary | ICD-10-CM | POA: Diagnosis not present

## 2016-09-16 DIAGNOSIS — D509 Iron deficiency anemia, unspecified: Secondary | ICD-10-CM | POA: Diagnosis not present

## 2016-09-18 DIAGNOSIS — D631 Anemia in chronic kidney disease: Secondary | ICD-10-CM | POA: Diagnosis not present

## 2016-09-18 DIAGNOSIS — N2581 Secondary hyperparathyroidism of renal origin: Secondary | ICD-10-CM | POA: Diagnosis not present

## 2016-09-18 DIAGNOSIS — D472 Monoclonal gammopathy: Secondary | ICD-10-CM | POA: Diagnosis not present

## 2016-09-18 DIAGNOSIS — Z992 Dependence on renal dialysis: Secondary | ICD-10-CM | POA: Diagnosis not present

## 2016-09-18 DIAGNOSIS — N186 End stage renal disease: Secondary | ICD-10-CM | POA: Diagnosis not present

## 2016-09-18 DIAGNOSIS — D509 Iron deficiency anemia, unspecified: Secondary | ICD-10-CM | POA: Diagnosis not present

## 2016-09-20 DIAGNOSIS — D472 Monoclonal gammopathy: Secondary | ICD-10-CM | POA: Diagnosis not present

## 2016-09-20 DIAGNOSIS — D509 Iron deficiency anemia, unspecified: Secondary | ICD-10-CM | POA: Diagnosis not present

## 2016-09-20 DIAGNOSIS — N186 End stage renal disease: Secondary | ICD-10-CM | POA: Diagnosis not present

## 2016-09-20 DIAGNOSIS — N2581 Secondary hyperparathyroidism of renal origin: Secondary | ICD-10-CM | POA: Diagnosis not present

## 2016-09-20 DIAGNOSIS — Z992 Dependence on renal dialysis: Secondary | ICD-10-CM | POA: Diagnosis not present

## 2016-09-20 DIAGNOSIS — D631 Anemia in chronic kidney disease: Secondary | ICD-10-CM | POA: Diagnosis not present

## 2016-09-23 DIAGNOSIS — N2581 Secondary hyperparathyroidism of renal origin: Secondary | ICD-10-CM | POA: Diagnosis not present

## 2016-09-23 DIAGNOSIS — N186 End stage renal disease: Secondary | ICD-10-CM | POA: Diagnosis not present

## 2016-09-23 DIAGNOSIS — Z992 Dependence on renal dialysis: Secondary | ICD-10-CM | POA: Diagnosis not present

## 2016-09-23 DIAGNOSIS — D509 Iron deficiency anemia, unspecified: Secondary | ICD-10-CM | POA: Diagnosis not present

## 2016-09-23 DIAGNOSIS — D631 Anemia in chronic kidney disease: Secondary | ICD-10-CM | POA: Diagnosis not present

## 2016-09-23 DIAGNOSIS — D472 Monoclonal gammopathy: Secondary | ICD-10-CM | POA: Diagnosis not present

## 2016-09-25 ENCOUNTER — Other Ambulatory Visit (HOSPITAL_COMMUNITY): Payer: Medicare Other

## 2016-09-25 ENCOUNTER — Ambulatory Visit (HOSPITAL_COMMUNITY): Payer: Medicare Other | Admitting: Oncology

## 2016-09-25 DIAGNOSIS — N2581 Secondary hyperparathyroidism of renal origin: Secondary | ICD-10-CM | POA: Diagnosis not present

## 2016-09-25 DIAGNOSIS — D472 Monoclonal gammopathy: Secondary | ICD-10-CM | POA: Diagnosis not present

## 2016-09-25 DIAGNOSIS — D631 Anemia in chronic kidney disease: Secondary | ICD-10-CM | POA: Diagnosis not present

## 2016-09-25 DIAGNOSIS — D509 Iron deficiency anemia, unspecified: Secondary | ICD-10-CM | POA: Diagnosis not present

## 2016-09-25 DIAGNOSIS — N186 End stage renal disease: Secondary | ICD-10-CM | POA: Diagnosis not present

## 2016-09-25 DIAGNOSIS — Z992 Dependence on renal dialysis: Secondary | ICD-10-CM | POA: Diagnosis not present

## 2016-09-26 ENCOUNTER — Other Ambulatory Visit (HOSPITAL_COMMUNITY): Payer: Medicare Other

## 2016-09-26 ENCOUNTER — Ambulatory Visit (HOSPITAL_COMMUNITY): Payer: Medicare Other | Admitting: Oncology

## 2016-09-27 DIAGNOSIS — D472 Monoclonal gammopathy: Secondary | ICD-10-CM | POA: Diagnosis not present

## 2016-09-27 DIAGNOSIS — N186 End stage renal disease: Secondary | ICD-10-CM | POA: Diagnosis not present

## 2016-09-27 DIAGNOSIS — Z992 Dependence on renal dialysis: Secondary | ICD-10-CM | POA: Diagnosis not present

## 2016-09-27 DIAGNOSIS — D509 Iron deficiency anemia, unspecified: Secondary | ICD-10-CM | POA: Diagnosis not present

## 2016-09-27 DIAGNOSIS — D631 Anemia in chronic kidney disease: Secondary | ICD-10-CM | POA: Diagnosis not present

## 2016-09-27 DIAGNOSIS — N2581 Secondary hyperparathyroidism of renal origin: Secondary | ICD-10-CM | POA: Diagnosis not present

## 2016-09-30 DIAGNOSIS — N189 Chronic kidney disease, unspecified: Secondary | ICD-10-CM | POA: Diagnosis not present

## 2016-09-30 DIAGNOSIS — D631 Anemia in chronic kidney disease: Secondary | ICD-10-CM | POA: Diagnosis not present

## 2016-09-30 DIAGNOSIS — Z992 Dependence on renal dialysis: Secondary | ICD-10-CM | POA: Diagnosis not present

## 2016-09-30 DIAGNOSIS — N186 End stage renal disease: Secondary | ICD-10-CM | POA: Diagnosis not present

## 2016-09-30 DIAGNOSIS — D509 Iron deficiency anemia, unspecified: Secondary | ICD-10-CM | POA: Diagnosis not present

## 2016-09-30 DIAGNOSIS — E859 Amyloidosis, unspecified: Secondary | ICD-10-CM | POA: Diagnosis not present

## 2016-09-30 DIAGNOSIS — I130 Hypertensive heart and chronic kidney disease with heart failure and stage 1 through stage 4 chronic kidney disease, or unspecified chronic kidney disease: Secondary | ICD-10-CM | POA: Diagnosis not present

## 2016-09-30 DIAGNOSIS — N2581 Secondary hyperparathyroidism of renal origin: Secondary | ICD-10-CM | POA: Diagnosis not present

## 2016-09-30 DIAGNOSIS — D472 Monoclonal gammopathy: Secondary | ICD-10-CM | POA: Diagnosis not present

## 2016-09-30 DIAGNOSIS — R799 Abnormal finding of blood chemistry, unspecified: Secondary | ICD-10-CM | POA: Diagnosis not present

## 2016-10-02 DIAGNOSIS — D631 Anemia in chronic kidney disease: Secondary | ICD-10-CM | POA: Diagnosis not present

## 2016-10-02 DIAGNOSIS — N2581 Secondary hyperparathyroidism of renal origin: Secondary | ICD-10-CM | POA: Diagnosis not present

## 2016-10-02 DIAGNOSIS — N186 End stage renal disease: Secondary | ICD-10-CM | POA: Diagnosis not present

## 2016-10-02 DIAGNOSIS — Z992 Dependence on renal dialysis: Secondary | ICD-10-CM | POA: Diagnosis not present

## 2016-10-02 DIAGNOSIS — D509 Iron deficiency anemia, unspecified: Secondary | ICD-10-CM | POA: Diagnosis not present

## 2016-10-02 DIAGNOSIS — D472 Monoclonal gammopathy: Secondary | ICD-10-CM | POA: Diagnosis not present

## 2016-10-03 DIAGNOSIS — N186 End stage renal disease: Secondary | ICD-10-CM | POA: Diagnosis not present

## 2016-10-03 DIAGNOSIS — N2581 Secondary hyperparathyroidism of renal origin: Secondary | ICD-10-CM | POA: Diagnosis not present

## 2016-10-03 DIAGNOSIS — D509 Iron deficiency anemia, unspecified: Secondary | ICD-10-CM | POA: Diagnosis not present

## 2016-10-03 DIAGNOSIS — D472 Monoclonal gammopathy: Secondary | ICD-10-CM | POA: Diagnosis not present

## 2016-10-03 DIAGNOSIS — Z992 Dependence on renal dialysis: Secondary | ICD-10-CM | POA: Diagnosis not present

## 2016-10-04 DIAGNOSIS — D472 Monoclonal gammopathy: Secondary | ICD-10-CM | POA: Diagnosis not present

## 2016-10-04 DIAGNOSIS — D509 Iron deficiency anemia, unspecified: Secondary | ICD-10-CM | POA: Diagnosis not present

## 2016-10-04 DIAGNOSIS — Z992 Dependence on renal dialysis: Secondary | ICD-10-CM | POA: Diagnosis not present

## 2016-10-04 DIAGNOSIS — N186 End stage renal disease: Secondary | ICD-10-CM | POA: Diagnosis not present

## 2016-10-04 DIAGNOSIS — N2581 Secondary hyperparathyroidism of renal origin: Secondary | ICD-10-CM | POA: Diagnosis not present

## 2016-10-07 DIAGNOSIS — D472 Monoclonal gammopathy: Secondary | ICD-10-CM | POA: Diagnosis not present

## 2016-10-07 DIAGNOSIS — N186 End stage renal disease: Secondary | ICD-10-CM | POA: Diagnosis not present

## 2016-10-07 DIAGNOSIS — N2581 Secondary hyperparathyroidism of renal origin: Secondary | ICD-10-CM | POA: Diagnosis not present

## 2016-10-07 DIAGNOSIS — D509 Iron deficiency anemia, unspecified: Secondary | ICD-10-CM | POA: Diagnosis not present

## 2016-10-07 DIAGNOSIS — Z992 Dependence on renal dialysis: Secondary | ICD-10-CM | POA: Diagnosis not present

## 2016-10-08 DIAGNOSIS — I130 Hypertensive heart and chronic kidney disease with heart failure and stage 1 through stage 4 chronic kidney disease, or unspecified chronic kidney disease: Secondary | ICD-10-CM | POA: Diagnosis not present

## 2016-10-08 DIAGNOSIS — E859 Amyloidosis, unspecified: Secondary | ICD-10-CM | POA: Diagnosis not present

## 2016-10-08 DIAGNOSIS — R799 Abnormal finding of blood chemistry, unspecified: Secondary | ICD-10-CM | POA: Diagnosis not present

## 2016-10-08 DIAGNOSIS — N189 Chronic kidney disease, unspecified: Secondary | ICD-10-CM | POA: Diagnosis not present

## 2016-10-08 DIAGNOSIS — I509 Heart failure, unspecified: Secondary | ICD-10-CM | POA: Diagnosis not present

## 2016-10-09 DIAGNOSIS — Z992 Dependence on renal dialysis: Secondary | ICD-10-CM | POA: Diagnosis not present

## 2016-10-09 DIAGNOSIS — D472 Monoclonal gammopathy: Secondary | ICD-10-CM | POA: Diagnosis not present

## 2016-10-09 DIAGNOSIS — D509 Iron deficiency anemia, unspecified: Secondary | ICD-10-CM | POA: Diagnosis not present

## 2016-10-09 DIAGNOSIS — N186 End stage renal disease: Secondary | ICD-10-CM | POA: Diagnosis not present

## 2016-10-09 DIAGNOSIS — N2581 Secondary hyperparathyroidism of renal origin: Secondary | ICD-10-CM | POA: Diagnosis not present

## 2016-10-11 DIAGNOSIS — N2581 Secondary hyperparathyroidism of renal origin: Secondary | ICD-10-CM | POA: Diagnosis not present

## 2016-10-11 DIAGNOSIS — D509 Iron deficiency anemia, unspecified: Secondary | ICD-10-CM | POA: Diagnosis not present

## 2016-10-11 DIAGNOSIS — N186 End stage renal disease: Secondary | ICD-10-CM | POA: Diagnosis not present

## 2016-10-11 DIAGNOSIS — D472 Monoclonal gammopathy: Secondary | ICD-10-CM | POA: Diagnosis not present

## 2016-10-11 DIAGNOSIS — Z992 Dependence on renal dialysis: Secondary | ICD-10-CM | POA: Diagnosis not present

## 2016-10-14 DIAGNOSIS — Z992 Dependence on renal dialysis: Secondary | ICD-10-CM | POA: Diagnosis not present

## 2016-10-14 DIAGNOSIS — N2581 Secondary hyperparathyroidism of renal origin: Secondary | ICD-10-CM | POA: Diagnosis not present

## 2016-10-14 DIAGNOSIS — D472 Monoclonal gammopathy: Secondary | ICD-10-CM | POA: Diagnosis not present

## 2016-10-14 DIAGNOSIS — N186 End stage renal disease: Secondary | ICD-10-CM | POA: Diagnosis not present

## 2016-10-14 DIAGNOSIS — D509 Iron deficiency anemia, unspecified: Secondary | ICD-10-CM | POA: Diagnosis not present

## 2016-10-16 DIAGNOSIS — N186 End stage renal disease: Secondary | ICD-10-CM | POA: Diagnosis not present

## 2016-10-16 DIAGNOSIS — N2581 Secondary hyperparathyroidism of renal origin: Secondary | ICD-10-CM | POA: Diagnosis not present

## 2016-10-16 DIAGNOSIS — D472 Monoclonal gammopathy: Secondary | ICD-10-CM | POA: Diagnosis not present

## 2016-10-16 DIAGNOSIS — Z992 Dependence on renal dialysis: Secondary | ICD-10-CM | POA: Diagnosis not present

## 2016-10-16 DIAGNOSIS — D509 Iron deficiency anemia, unspecified: Secondary | ICD-10-CM | POA: Diagnosis not present

## 2016-10-18 DIAGNOSIS — N186 End stage renal disease: Secondary | ICD-10-CM | POA: Diagnosis not present

## 2016-10-18 DIAGNOSIS — Z992 Dependence on renal dialysis: Secondary | ICD-10-CM | POA: Diagnosis not present

## 2016-10-18 DIAGNOSIS — D472 Monoclonal gammopathy: Secondary | ICD-10-CM | POA: Diagnosis not present

## 2016-10-18 DIAGNOSIS — D509 Iron deficiency anemia, unspecified: Secondary | ICD-10-CM | POA: Diagnosis not present

## 2016-10-18 DIAGNOSIS — N2581 Secondary hyperparathyroidism of renal origin: Secondary | ICD-10-CM | POA: Diagnosis not present

## 2016-10-21 DIAGNOSIS — Z992 Dependence on renal dialysis: Secondary | ICD-10-CM | POA: Diagnosis not present

## 2016-10-21 DIAGNOSIS — D509 Iron deficiency anemia, unspecified: Secondary | ICD-10-CM | POA: Diagnosis not present

## 2016-10-21 DIAGNOSIS — N186 End stage renal disease: Secondary | ICD-10-CM | POA: Diagnosis not present

## 2016-10-21 DIAGNOSIS — D472 Monoclonal gammopathy: Secondary | ICD-10-CM | POA: Diagnosis not present

## 2016-10-21 DIAGNOSIS — N2581 Secondary hyperparathyroidism of renal origin: Secondary | ICD-10-CM | POA: Diagnosis not present

## 2016-10-23 DIAGNOSIS — N186 End stage renal disease: Secondary | ICD-10-CM | POA: Diagnosis not present

## 2016-10-23 DIAGNOSIS — D509 Iron deficiency anemia, unspecified: Secondary | ICD-10-CM | POA: Diagnosis not present

## 2016-10-23 DIAGNOSIS — D472 Monoclonal gammopathy: Secondary | ICD-10-CM | POA: Diagnosis not present

## 2016-10-23 DIAGNOSIS — C9000 Multiple myeloma not having achieved remission: Secondary | ICD-10-CM | POA: Diagnosis not present

## 2016-10-23 DIAGNOSIS — Z992 Dependence on renal dialysis: Secondary | ICD-10-CM | POA: Diagnosis not present

## 2016-10-23 DIAGNOSIS — N2581 Secondary hyperparathyroidism of renal origin: Secondary | ICD-10-CM | POA: Diagnosis not present

## 2016-10-25 ENCOUNTER — Telehealth (HOSPITAL_COMMUNITY): Payer: Self-pay | Admitting: Oncology

## 2016-10-25 ENCOUNTER — Encounter (HOSPITAL_COMMUNITY): Payer: Medicare Other | Attending: Oncology | Admitting: Oncology

## 2016-10-25 ENCOUNTER — Other Ambulatory Visit (HOSPITAL_COMMUNITY): Payer: Medicare Other

## 2016-10-25 ENCOUNTER — Encounter: Payer: Self-pay | Admitting: Hematology

## 2016-10-25 ENCOUNTER — Encounter (HOSPITAL_COMMUNITY): Payer: Self-pay | Admitting: Oncology

## 2016-10-25 VITALS — BP 124/72 | HR 84 | Temp 97.6°F | Resp 16 | Wt 143.2 lb

## 2016-10-25 DIAGNOSIS — C9000 Multiple myeloma not having achieved remission: Secondary | ICD-10-CM

## 2016-10-25 DIAGNOSIS — N2581 Secondary hyperparathyroidism of renal origin: Secondary | ICD-10-CM | POA: Diagnosis not present

## 2016-10-25 DIAGNOSIS — Z992 Dependence on renal dialysis: Secondary | ICD-10-CM | POA: Diagnosis not present

## 2016-10-25 DIAGNOSIS — D472 Monoclonal gammopathy: Secondary | ICD-10-CM | POA: Diagnosis not present

## 2016-10-25 DIAGNOSIS — D509 Iron deficiency anemia, unspecified: Secondary | ICD-10-CM | POA: Diagnosis not present

## 2016-10-25 DIAGNOSIS — N186 End stage renal disease: Secondary | ICD-10-CM | POA: Diagnosis not present

## 2016-10-25 MED ORDER — ACYCLOVIR 400 MG PO TABS: 400.0000 mg | ORAL_TABLET | Freq: Two times a day (BID) | ORAL | 3 refills | Status: DC

## 2016-10-25 MED ORDER — CYCLOPHOSPHAMIDE 50 MG PO TABS: 150.0000 mg/m2 | ORAL_TABLET | Freq: Every day | ORAL | 3 refills | Status: DC

## 2016-10-25 MED ORDER — DEXAMETHASONE 4 MG PO TABS: ORAL_TABLET | ORAL | 3 refills | Status: DC

## 2016-10-25 NOTE — Telephone Encounter (Signed)
FAXED [facility]

## 2016-10-25 NOTE — Progress Notes (Signed)
[name], MD [address] [place] [address]  Multiple myeloma not having achieved remission ([place]) - Plan: CBC with Differential, Comprehensive metabolic panel, Lactate dehydrogenase, Beta 2 microglobuline, serum, Kappa/lambda light chains, IgG, IgA, IgM, Immunofixation electrophoresis, Protein electrophoresis, serum, dexamethasone (DECADRON) 4 MG tablet, acyclovir (ZOVIRAX) 400 MG tablet, cyclophosphamide (CYTOXAN) 50 MG tablet, CBC with Differential, Comprehensive metabolic panel, CBC with Differential, Comprehensive metabolic panel, Kappa/lambda light chains, Beta 2 microglobuline, serum, IgG, IgA, IgM, Immunofixation electrophoresis, Protein electrophoresis, serum  CURRENT THERAPY: Planning for therapy.  INTERVAL HISTORY: Brian Liu 66 y.o. 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  He has been seen by Dr. Norma Fredrickson.   He is doing well.  He denies any complaints.  He denies any new pain.  I have reviewed Dr. Rossie Muskrat note with the patient.  He notes that he is ready for treatment.  He denies any questions.  Review of Systems  Constitutional: Negative.  Negative for chills, fever and weight loss.  HENT: Negative.   Eyes: Negative.   Respiratory: Negative.  Negative for cough.   Cardiovascular: Negative.  Negative for chest pain.  Gastrointestinal: Negative.  Negative for blood in stool, constipation, diarrhea, melena, nausea and vomiting.  Genitourinary: Negative.   Musculoskeletal: Negative.   Skin: Negative.   Neurological: Negative.  Negative for weakness.  Endo/Heme/Allergies: Negative.   Psychiatric/Behavioral: Negative.     Past Medical History:  Diagnosis Date  . Atrial fibrillation (HCC)    Permanent, Not on Coumadin because of history of lower GI bleed  . Diastolic dysfunction    Restrictive  diastolic filling pattern echo,  February, 2013  . Dyslipidemia   . Ejection fraction    EF 60-65%, echo, 2013  .  ESRD (end stage renal disease) ([facility])    Dialysis, fistula in left arm  . Hypertension   . Lower gastrointestinal bleed   . LVH (left ventricular hypertrophy)   . 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 Mitral regurgitation    Mild by echo, 2013  . Multiple myeloma ([place]) 04/30/2016  . Pancytopenia ([place])    Dr Ignacia Bayley  . Pulmonary hypertension    Severe, PA pressure 65-70 mm of mercury, echo, 2013  . Tobacco abuse     Past Surgical History:  Procedure Laterality Date  . AV FISTULA PLACEMENT Left 1992   Left arm   . 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 Other Mother     Deceased, 58  . Stomach cancer Father     Deceased, 61  . Kidney failure Son   . Healthy Son     Social History   Social History  . Marital status: Widowed    Spouse name: N/A  . Number of children: N/A  . Years of education: N/A   Social History Main Topics  . Smoking status: Never Smoker  . Smokeless tobacco: Never Used  . Alcohol use No  . Drug use: No  . Sexual activity: Not on file   Other Topics Concern  . Not on file   Social History Narrative   Lives alone in a one story home.  Has 2 sons.     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     Education high school.  PHYSICAL EXAMINATION  ECOG PERFORMANCE STATUS: 1 - Symptomatic but completely ambulatory  Vitals:   10/25/16 1345  BP: 124/72  Pulse: 84  Resp: 16  Temp: 97.6 F (36.4 C)    GENERAL:alert, no distress, well developed, comfortable, cooperative, smiling and unaccompanied SKIN: skin color, texture, turgor are normal, no rashes or significant lesions HEAD: Normocephalic, No masses, lesions, tenderness or abnormalities EYES: normal EARS: External ears normal OROPHARYNX:lips, buccal mucosa, and tongue normal and mucous membranes are moist  NECK: supple,  trachea midline LYMPH:  no palpable lymphadenopathy BREAST:not examined LUNGS: clear to auscultation  HEART: regular rate & rhythm and systolic ejection murmur heard best in LSB ABDOMEN:abdomen soft and normal bowel sounds BACK: Back symmetric, no curvature. EXTREMITIES:less then 2 second capillary refill, no joint deformities, effusion, or inflammation, no skin discoloration, no cyanosis  NEURO: alert & oriented x 3 with fluent speech, no focal motor/sensory deficits, gait normal   LABORATORY DATA: CBC    Component Value Date/Time   WBC 4.1 06/11/2016 0644   RBC 4.52 06/11/2016 0644   HGB 11.6 (L) 06/11/2016 0644   HCT 36.8 (L) 06/11/2016 0644   PLT 141 (L) 06/11/2016 0644   MCV 81.4 06/11/2016 0644   MCH 25.7 (L) 06/11/2016 0644   MCHC 31.5 06/11/2016 0644   RDW 16.5 (H) 06/11/2016 0644   LYMPHSABS 1.5 06/11/2016 0644   MONOABS 0.4 06/11/2016 0644   EOSABS 0.2 06/11/2016 0644   BASOSABS 0.0 06/11/2016 0644      Chemistry      Component Value Date/Time   NA 140 06/11/2016 0706   K 4.9 06/11/2016 0706   CL 99 (L) 06/11/2016 0706   CO2 29 06/11/2016 0706   BUN 26 (H) 06/11/2016 0706   CREATININE 7.41 (H) 06/11/2016 0706      Component Value Date/Time   CALCIUM 8.8 (L) 06/11/2016 0706   ALKPHOS 49 04/30/2016 1513   AST 18 04/30/2016 1513   ALT 9 (L) 04/30/2016 1513   BILITOT 0.7 04/30/2016 1513        PENDING LABS:   RADIOGRAPHIC STUDIES:  No results found.   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  He has been seen by Dr. Norma Fredrickson.  On hemodialysis on M-W-F (6-10 AM).  I have reviewed Dr. Norma Fredrickson note from [facility] on 10/04/2016:  IgG kappa myeloma and possible amyloid: The bone marrow biopsy findings of amyloid are not consistent with typical pattern of amyloid and represent formalin pigments. Recommend the block be sent to the [facility] for mass  spectrometry and further characterization. We will perform a fat pad biopsy based on abnormal troponin and BNP that may be suspicious for infiltrative process. Will schedule Mr. Nease for a 2D echo. He will RTC in 4 weeks to go over his results and determine final diagnosis. His lytic lesions and amount of clonal plasma cells in the marrow are consistent with active myeloma but will benefit from clarification on amyloid involvement before starting any therapy.   I have been in contact with Dr. Norma Fredrickson today about this patient.  He recommended starting CyborD while further work-up is underway at [facility] with fat pad biopsy results and cardiology consult given the concern for possible infiltrative process with abnormal troponin and BNP.  He will need chemotherapy teaching for CyborD.   He will need a calendar developed for him to keep track of his medications given the complexity  of this treatment schedule.  I reviewed the risks, benefits, alternatives, and side effects of CyborD including, but not limited to, peripheral edema, DVT, hypertension, peripheral neuropathy, fatigue, arthralgia, headaches, paresthesia, dizziness, skin rash, diarrhea, nausea, vomiting, anorexia, abdominal pain, decreased appetite, pancytopenia, neutropenia, leukopenia, hemorrhage, increased risk for infection, weakness, dyspnea, increased risk for pneumonia, and fever, alopecia, altered hormone level, amenorrhea, abdominal pain, anorexia, diarrhea, mucositis, nausea and vomiting, stomatitis, azoospermia, hemorrhagic cystitis, sterility, anemia, bone marrow depression, febrile neutropenia, leukopenia, neutropenia, pancytopenia, and infection, anxiety, insomnia, agitation, death.  He does not want to start CyborD until after Easter.  He will be scheduled for Day 1 of treatment on 4/3.  CyborD treatment plan is built using VIA pathways.  He will get Sq Velcade days 1, 4, 8, 11 every 21 days after hemodialysis.  Cyclophosphamide will  be reduced by 50-75% due to hemodialysis and will be given after hemodialysis in accordance with the PI.  I have started low and we can always increase the dose based upon tolerability.  He will start Dexamethasone 40 mg weekly on 10/29/2016.  Rx printed for Acyclovir for Zoster prophylaxis.  Will wait for further recommendations from Dr. Norma Fredrickson in the future.  Labs on Day 1 each cycle: CBC diff, CMET, SPEP +IFE, light chain assay, and B2M.  Labs weekly: CBC diff, CMET.  Return in Day 8 or 11 of cycle 1 for follow-up.   ORDERS PLACED FOR THIS ENCOUNTER: Orders Placed This Encounter  Procedures  . CBC with Differential  . Comprehensive metabolic panel  . Lactate dehydrogenase  . Beta 2 microglobuline, serum  . Kappa/lambda light chains  . IgG, IgA, IgM  . Immunofixation electrophoresis  . Protein electrophoresis, serum  . CBC with Differential  . Comprehensive metabolic panel  . CBC with Differential  . Comprehensive metabolic panel  . Kappa/lambda light chains  . Beta 2 microglobuline, serum  . IgG, IgA, IgM  . Immunofixation electrophoresis  . Protein electrophoresis, serum    MEDICATIONS PRESCRIBED THIS ENCOUNTER: Meds ordered this encounter  Medications  . dexamethasone (DECADRON) 4 MG tablet    Sig: Take 10 tablets (40 mg) on days 1, 8, and 15 of chemo. Repeat every 21 days. Take 2 tablets (31m) daily for 2 days starting after days 1 and 8 of chemotherapy.    Dispense:  40 tablet    Refill:  3  . acyclovir (ZOVIRAX) 400 MG tablet    Sig: Take 1 tablet (400 mg total) by mouth 2 (two) times daily.    Dispense:  60 tablet    Refill:  3  . cyclophosphamide (CYTOXAN) 50 MG tablet    Sig: Take 6 tablets (300 mg total) by mouth daily. Take on days 1,8,15 of chemo 1hr before or 2hr after meals every 21d.    Dispense:  60 tablet    Refill:  3    THERAPY PLAN:  Will start CyborD soon as outlined above [facility]  All questions  were answered. The patient knows to call the clinic with any problems, questions or concerns. We can certainly see the patient much sooner if necessary.  Patient and plan discussed with Dr. LTwana Firstand she is in agreement with the aforementioned.   This note is electronically signed by: KDoy Mince3/23/2018 4:33 PM

## 2016-10-25 NOTE — Progress Notes (Signed)
START ON PATHWAY REGIMEN - Multiple Myeloma     A cycle is every 21 days:     Bortezomib      Cyclophosphamide      Dexamethasone   **Always confirm dose/schedule in your pharmacy ordering system**    Patient Characteristics: Newly Diagnosed, Transplant Eligible, Standard Risk R-ISS Staging: II Disease Classification: Newly Diagnosed Is Patient Eligible for Transplant? Transplant Eligible Risk Status: Standard Risk  Intent of Therapy: Non-Curative / Palliative Intent, Discussed with Patient

## 2016-10-25 NOTE — Assessment & Plan Note (Addendum)
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  He has been seen by Dr. Norma Fredrickson.  On hemodialysis on M-W-F (6-10 AM).  I have reviewed Dr. Norma Fredrickson note from [facility] on 10/04/2016:  IgG kappa myeloma and possible amyloid: The bone marrow biopsy findings of amyloid are not consistent with typical pattern of amyloid and represent formalin pigments. Recommend the block be sent to the [facility] for mass spectrometry and further characterization. We will perform a fat pad biopsy based on abnormal troponin and BNP that may be suspicious for infiltrative process. Will schedule Brian Liu for a 2D echo. He will RTC in 4 weeks to go over his results and determine final diagnosis. His lytic lesions and amount of clonal plasma cells in the marrow are consistent with active myeloma but will benefit from clarification on amyloid involvement before starting any therapy.   I have been in contact with Dr. Norma Fredrickson today about this patient.  He recommended starting CyborD while further work-up is underway at [facility] with fat pad biopsy results and cardiology consult given the concern for possible infiltrative process with abnormal troponin and BNP.  He will need chemotherapy teaching for CyborD.   He will need a calendar developed for him to keep track of his medications given the complexity of this treatment schedule.  I reviewed the risks, benefits, alternatives, and side effects of CyborD including, but not limited to, peripheral edema, DVT, hypertension, peripheral neuropathy, fatigue, arthralgia, headaches, paresthesia, dizziness, skin rash, diarrhea, nausea, vomiting, anorexia, abdominal pain, decreased appetite, pancytopenia, neutropenia, leukopenia, hemorrhage, increased risk for infection, weakness, dyspnea, increased risk for pneumonia, and fever, alopecia, altered hormone level, amenorrhea, abdominal pain, anorexia, diarrhea, mucositis, nausea and vomiting, stomatitis, azoospermia,  hemorrhagic cystitis, sterility, anemia, bone marrow depression, febrile neutropenia, leukopenia, neutropenia, pancytopenia, and infection, anxiety, insomnia, agitation, death.  He does not want to start CyborD until after Easter.  He will be scheduled for Day 1 of treatment on 4/3.  CyborD treatment plan is built using VIA pathways.  He will get Sq Velcade days 1, 4, 8, 11 every 21 days after hemodialysis.  Cyclophosphamide will be reduced by 50-75% due to hemodialysis and will be given after hemodialysis in accordance with the PI.  I have started low and we can always increase the dose based upon tolerability.  He will start Dexamethasone 40 mg weekly on 10/29/2016.  Rx printed for Acyclovir for Zoster prophylaxis.  Will wait for further recommendations from Dr. Norma Fredrickson in the future.  Labs on Day 1 each cycle: CBC diff, CMET, SPEP +IFE, light chain assay, and B2M.  Labs weekly: CBC diff, CMET.  Return in Day 8 or 11 of cycle 1 for follow-up.

## 2016-10-25 NOTE — Patient Instructions (Addendum)
[place] at [facility] Discharge Instructions  RECOMMENDATIONS MADE BY THE CONSULTANT AND ANY TEST RESULTS WILL BE SENT TO YOUR REFERRING PHYSICIAN.  You saw Kirby Crigler, PA-C, today. Take Dexamethasone 40 mg on Tuesday 3/27. Start taking Acyclovir daily on Tuesday 3/27. Chemo teaching will be 3/28. See Amy at checkout for appointments.  Thank you for choosing [place] at [facility] to provide your oncology and hematology care.  To afford each patient quality time with our provider, please arrive at least 15 minutes before your scheduled appointment time.    If you have a lab appointment with the [facility] please come in thru the  Main Entrance and check in at the main information desk  You need to re-schedule your appointment should you arrive 10 or more minutes late.  We strive to give you quality time with our providers, and arriving late affects you and other patients whose appointments are after yours.  Also, if you no show three or more times for appointments you may be dismissed from the clinic at the providers discretion.     Again, thank you for choosing [facility].  Our hope is that these requests will decrease the amount of time that you wait before being seen by our physicians.       _____________________________________________________________  Should you have questions after your visit to [facility] please contact our office at [phone] between the hours of 8:30 a.m. and 4:30 p.m.  Voicemails left after 4:30 p.m. will not be returned until the following business day.  For prescription refill requests, have your pharmacy contact our office.       Resources For Cancer Patients and their Caregivers ? American Cancer Society: Can assist with transportation, wigs, general needs, runs Look Good Feel Better.        414-503-7451 ? Cancer Care: Provides financial assistance, online support  groups, medication/co-pay assistance.  1-800-813-HOPE (251) 578-0145) ? [facility] Assists [place] cancer patients and their families through emotional , educational and financial support.  352 113 2072 ? [place] DSS Where to apply for food stamps, Medicaid and utility assistance. 805-620-6898 ? [employer]: Transportation to medical appointments. 212-096-0840 ? Social Security Administration: May apply for disability if have a Stage IV cancer. 239-582-7420 (225)409-0219 ? [employer], Disability and Transit Services: Assists with nutrition, care and transit needs. [place] Support Programs: @10RELATIVEDAYS @ > Cancer Support Group  2nd Tuesday of the month 1pm-2pm, Journey Room  > Creative Journey  3rd Tuesday of the month 1130am-1pm, Journey Room  > Look Good Feel Better  1st Wednesday of the month 10am-12 noon, Journey Room (Call [place] to register [phone])

## 2016-10-27 NOTE — Patient Instructions (Addendum)
[place]   CHEMOTHERAPY INSTRUCTIONS  You have Stage III Multiple Myeloma. The goal of treatment is to control your multiple myeloma.  The treatment of choice for your cancer is a treatment regimen known as CyborD. This is an abbreviation for the drugs you are receiving (Cytoxan, Velcade, & Dexamethasone).  Cytoxan - General (Cytoxan) Side Effects  Low white blood cell count which can increase the risk of infection  Nausea or vomiting, which can be acute (first 24 hours) or delayed (Days 2 - 5)  Hair loss, which is usually reversible. Hair may grow back a different color or texture (curly vs. straight)  Diarrhea Changes in skin/nails --- dry skin, dry brittle nails. Changes in color of skin/nail beds. Blood in urine    You will take this medication once a week on Tuesdays. Please refer to your calendar.   Velcade - You will take this on Day 1, 4, 8, 11 every 21 days here at the [facility]. This will be an injection in your abdominal tissue. Side Effects: peripheral neuropathy, (numbness, tingling, burning in hands/fingers/feet/toes - this is worse if given through an IV), hypotension, nausea, vomiting, diarrhea, blurred vision, fatigue, bone marrow suppression (low white blood cells-fight infection, low red blood cells- this makes up your hemoglobin & circulating blood volume, low platelets-this is what helps your blood to clot)   Dexamethasone - this is a steroid. Dexamethasone is useful in myeloma treatment because they can stop white blood cells from traveling to areas where cancerous myeloma cells are causing damage. This decreases the amount of swelling or inflammation in those areas and relieves associated pain and pressure. More importantly, in high doses, dexamethasone can actually kill myeloma cells. When combined with other myeloma drugs, it can also make those drugs work even better. Please refer to your calendar for dexamethasone  administration.   POTENTIAL SIDE EFFECTS OF TREATMENT: Increased Susceptibility to Infection, Vomiting, Hair Thinning, Changes in Character of Skin and Nails (brittleness, dryness,etc.), Bone Marrow Suppression, Blood in Urine, Complete Hair Loss, Nausea, Diarrhea, Sun Sensitivity and Mouth Sores    EDUCATIONAL MATERIALS GIVEN AND REVIEWED: Chemotherapy and You book  Specific Instruction Sheets: (Please refer to/review other teaching materials that have been provided to you in this blue folder - What to Know During Chemo, What to know After Chemo, General Advice about chemo, Constipation Sheet, Diarrhea Sheet, Nausea Sheet, Self Care Activities While on Chemo)    SELF CARE ACTIVITIES WHILE ON CHEMOTHERAPY: Increase your fluid intake 48 hours prior to treatment and drink at least 2 quarts per day after treatment., No alcohol intake., No aspirin or other medications unless approved by your oncologist., Eat foods that are light and easy to digest., Eat foods at cold or room temperature., No fried, fatty, or spicy foods immediately before or after treatment., Have teeth cleaned professionally before starting treatment. Keep dentures and partial plates clean., Use soft toothbrush and do not use mouthwashes that contain alcohol. Biotene is a good mouthwash that is available at most pharmacies or may be ordered by calling [phone]., Use warm salt water gargles (1 teaspoon salt per 1 quart warm water) before and after meals and at bedtime. Or you may rinse with 2 tablespoons of three -percent hydrogen peroxide mixed in eight ounces of water., Always use sunscreen with SPF (Sun Protection Factor) of 30 or higher., Use your nausea medication as directed to prevent nausea., Use your stool softener or laxative as directed to prevent constipation.  and Use your anti-diarrheal medication as directed to stop diarrhea.   MEDICATIONS: You have been given prescriptions for the following  medications:  Zofran/Ondansetron 61m tablet. Take 1 tablet every 8 hours as needed for nausea/vomiting. (#1 nausea med to take, this can constipate)  Compazine/Prochlorperazine 160mtablet. Take 1 tablet every 6 hours as needed for nausea/vomiting. (#2 nausea med to take, this can make you sleepy)  Acyclovir - take 1 tablet by mouth two times a day.  Dexamethasone - take 10 tablets (4024motal) on Tuesdays every week. Take 2 tablets (8mg48mtal) on Days 3, 4, 9, & 10. Refer to your calendar.  Over-the-Counter Meds:  Miralax 1 capful in 8 oz of fluid daily. May increase to two times a day if needed. This is a stool softener. If this doesn't work proceed you can add:  Senokot S  - start with 1 tablet two times a day and increase to 4 tablets two times a day if needed. (total of 8 tablets in a 24 hour period). This is a stimulant laxative.   Call us [facility] does not help your bowels move.   Imodium 2mg 37msule. Take 2 capsules after the 1st loose stool and then 1 capsule every 2 hours until you go a total of 12 hours without having a loose stool. Call the [facility] st[place] continue. If diarrhea occurs @ bedtime, take 2 capsules @ bedtime. Then take 2 capsules every 4 hours until morning. Call [facility] TO REPORT AS SOON AS POSSIBLE AFTER TREATMENT:  FEVER GREATER THAN 100.5 F  CHILLS WITH OR WITHOUT FEVER  NAUSEA AND VOMITING THAT IS NOT CONTROLLED WITH YOUR NAUSEA MEDICATION  UNUSUAL SHORTNESS OF BREATH  UNUSUAL BRUISING OR BLEEDING  TENDERNESS IN MOUTH AND THROAT WITH OR WITHOUT PRESENCE OF ULCERS  URINARY PROBLEMS  BOWEL PROBLEMS  UNUSUAL RASH    Wear comfortable clothing and clothing appropriate for easy access to any Portacath or PICC line. Let us [place] if there is anything that we can do to make your therapy better!      I have been informed and understand all of the instructions given to me and have received a copy. I have been  instructed to call the clinic [phone] family physician as soon as possible for continued medical care, if indicated. I do not have any more questions at this time but understand that I may call the [facility] Patient Navigator at [phone] office hours should I have questions or need assistance in obtaining follow-up care.

## 2016-10-28 ENCOUNTER — Other Ambulatory Visit (HOSPITAL_COMMUNITY): Payer: Medicare Other

## 2016-10-28 ENCOUNTER — Other Ambulatory Visit (HOSPITAL_COMMUNITY): Payer: Self-pay | Admitting: Oncology

## 2016-10-28 ENCOUNTER — Ambulatory Visit (HOSPITAL_COMMUNITY): Payer: Medicare Other | Admitting: Hematology & Oncology

## 2016-10-28 DIAGNOSIS — D509 Iron deficiency anemia, unspecified: Secondary | ICD-10-CM | POA: Diagnosis not present

## 2016-10-28 DIAGNOSIS — Z79899 Other long term (current) drug therapy: Secondary | ICD-10-CM | POA: Diagnosis not present

## 2016-10-28 DIAGNOSIS — E785 Hyperlipidemia, unspecified: Secondary | ICD-10-CM | POA: Diagnosis not present

## 2016-10-28 DIAGNOSIS — Z992 Dependence on renal dialysis: Secondary | ICD-10-CM | POA: Diagnosis not present

## 2016-10-28 DIAGNOSIS — D472 Monoclonal gammopathy: Secondary | ICD-10-CM | POA: Diagnosis not present

## 2016-10-28 DIAGNOSIS — N2581 Secondary hyperparathyroidism of renal origin: Secondary | ICD-10-CM | POA: Diagnosis not present

## 2016-10-28 DIAGNOSIS — N186 End stage renal disease: Secondary | ICD-10-CM | POA: Diagnosis not present

## 2016-10-28 MED ORDER — CYCLOPHOSPHAMIDE 50 MG PO CAPS: ORAL_CAPSULE | ORAL | 5 refills | Status: DC

## 2016-10-29 MED ORDER — PROCHLORPERAZINE MALEATE 10 MG PO TABS: 10.0000 mg | ORAL_TABLET | Freq: Four times a day (QID) | ORAL | 2 refills | Status: DC | PRN

## 2016-10-29 MED ORDER — ONDANSETRON HCL 8 MG PO TABS: 8.0000 mg | ORAL_TABLET | Freq: Three times a day (TID) | ORAL | 2 refills | Status: DC | PRN

## 2016-10-30 ENCOUNTER — Encounter (HOSPITAL_COMMUNITY): Payer: Medicare Other

## 2016-10-30 DIAGNOSIS — Z992 Dependence on renal dialysis: Secondary | ICD-10-CM | POA: Diagnosis not present

## 2016-10-30 DIAGNOSIS — D472 Monoclonal gammopathy: Secondary | ICD-10-CM | POA: Diagnosis not present

## 2016-10-30 DIAGNOSIS — N2581 Secondary hyperparathyroidism of renal origin: Secondary | ICD-10-CM | POA: Diagnosis not present

## 2016-10-30 DIAGNOSIS — C9000 Multiple myeloma not having achieved remission: Secondary | ICD-10-CM

## 2016-10-30 DIAGNOSIS — D509 Iron deficiency anemia, unspecified: Secondary | ICD-10-CM | POA: Diagnosis not present

## 2016-10-30 DIAGNOSIS — N186 End stage renal disease: Secondary | ICD-10-CM | POA: Diagnosis not present

## 2016-10-30 NOTE — Progress Notes (Signed)
Chemo teaching done for CyborD (Velcade, Dexamethasone, Cytoxan). Consent signed. Blue folder given. Chemo alert card given. Patient given med calendar and appt calendar. Pt will need reinforcement @ Tuesday 11/05/16 of calendar and side effects. Patient's son lives with him. Pt has been on dialysis for 20 years. Pt brought his Dexamethasone with him. He took 8mg  yesterday. Tom instructed pt to take 32mg  today to =40mg  total. Tom wanted pt to take 40mg  Dex on 10/29/16. Pt not due to start chemo until 11/05/16 and he will bring his meds that day so that we can help him with administration. Distress screening done with patient.

## 2016-11-01 DIAGNOSIS — Z992 Dependence on renal dialysis: Secondary | ICD-10-CM | POA: Diagnosis not present

## 2016-11-01 DIAGNOSIS — N2581 Secondary hyperparathyroidism of renal origin: Secondary | ICD-10-CM | POA: Diagnosis not present

## 2016-11-01 DIAGNOSIS — D472 Monoclonal gammopathy: Secondary | ICD-10-CM | POA: Diagnosis not present

## 2016-11-01 DIAGNOSIS — D509 Iron deficiency anemia, unspecified: Secondary | ICD-10-CM | POA: Diagnosis not present

## 2016-11-01 DIAGNOSIS — N186 End stage renal disease: Secondary | ICD-10-CM | POA: Diagnosis not present

## 2016-11-02 DIAGNOSIS — Z992 Dependence on renal dialysis: Secondary | ICD-10-CM | POA: Diagnosis not present

## 2016-11-02 DIAGNOSIS — N186 End stage renal disease: Secondary | ICD-10-CM | POA: Diagnosis not present

## 2016-11-03 DIAGNOSIS — D472 Monoclonal gammopathy: Secondary | ICD-10-CM | POA: Diagnosis not present

## 2016-11-03 DIAGNOSIS — N186 End stage renal disease: Secondary | ICD-10-CM | POA: Diagnosis not present

## 2016-11-03 DIAGNOSIS — D509 Iron deficiency anemia, unspecified: Secondary | ICD-10-CM | POA: Diagnosis not present

## 2016-11-03 DIAGNOSIS — N2581 Secondary hyperparathyroidism of renal origin: Secondary | ICD-10-CM | POA: Diagnosis not present

## 2016-11-03 DIAGNOSIS — Z992 Dependence on renal dialysis: Secondary | ICD-10-CM | POA: Diagnosis not present

## 2016-11-04 DIAGNOSIS — D472 Monoclonal gammopathy: Secondary | ICD-10-CM | POA: Diagnosis not present

## 2016-11-04 DIAGNOSIS — D509 Iron deficiency anemia, unspecified: Secondary | ICD-10-CM | POA: Diagnosis not present

## 2016-11-04 DIAGNOSIS — N186 End stage renal disease: Secondary | ICD-10-CM | POA: Diagnosis not present

## 2016-11-04 DIAGNOSIS — N2581 Secondary hyperparathyroidism of renal origin: Secondary | ICD-10-CM | POA: Diagnosis not present

## 2016-11-04 DIAGNOSIS — Z992 Dependence on renal dialysis: Secondary | ICD-10-CM | POA: Diagnosis not present

## 2016-11-05 ENCOUNTER — Encounter (HOSPITAL_COMMUNITY): Payer: Self-pay

## 2016-11-05 ENCOUNTER — Encounter (HOSPITAL_COMMUNITY): Payer: Medicare Other | Attending: Oncology

## 2016-11-05 ENCOUNTER — Encounter (HOSPITAL_COMMUNITY): Payer: Medicare Other

## 2016-11-05 VITALS — BP 101/68 | HR 75 | Temp 98.0°F | Resp 20 | Wt 146.4 lb

## 2016-11-05 DIAGNOSIS — C9000 Multiple myeloma not having achieved remission: Secondary | ICD-10-CM

## 2016-11-05 DIAGNOSIS — Z5112 Encounter for antineoplastic immunotherapy: Secondary | ICD-10-CM | POA: Diagnosis present

## 2016-11-05 LAB — COMPREHENSIVE METABOLIC PANEL
ALBUMIN: 4.2 g/dL (ref 3.5–5.0)
ALT: 21 U/L (ref 17–63)
AST: 18 U/L (ref 15–41)
Alkaline Phosphatase: 50 U/L (ref 38–126)
Anion gap: 15 (ref 5–15)
BUN: 50 mg/dL — AB (ref 6–20)
CHLORIDE: 96 mmol/L — AB (ref 101–111)
CO2: 29 mmol/L (ref 22–32)
Calcium: 9.3 mg/dL (ref 8.9–10.3)
Creatinine, Ser: 8.59 mg/dL — ABNORMAL HIGH (ref 0.61–1.24)
GFR calc Af Amer: 7 mL/min — ABNORMAL LOW (ref >60)
GFR calc non Af Amer: 6 mL/min — ABNORMAL LOW (ref >60)
GLUCOSE: 91 mg/dL (ref 65–99)
POTASSIUM: 4.1 mmol/L (ref 3.5–5.1)
Sodium: 140 mmol/L (ref 135–145)
Total Bilirubin: 0.8 mg/dL (ref 0.3–1.2)
Total Protein: 8.8 g/dL — ABNORMAL HIGH (ref 6.5–8.1)

## 2016-11-05 LAB — CBC WITH DIFFERENTIAL/PLATELET
BASOS ABS: 0 10*3/uL (ref 0.0–0.1)
BASOS PCT: 0 %
Eosinophils Absolute: 0.2 10*3/uL (ref 0.0–0.7)
Eosinophils Relative: 1 %
HEMATOCRIT: 38 % — AB (ref 39.0–52.0)
Hemoglobin: 12.5 g/dL — ABNORMAL LOW (ref 13.0–17.0)
Lymphocytes Relative: 21 %
Lymphs Abs: 2.2 10*3/uL (ref 0.7–4.0)
MCH: 26 pg (ref 26.0–34.0)
MCHC: 32.9 g/dL (ref 30.0–36.0)
MCV: 79 fL (ref 78.0–100.0)
MONO ABS: 0.7 10*3/uL (ref 0.1–1.0)
Monocytes Relative: 7 %
NEUTROS ABS: 7.4 10*3/uL (ref 1.7–7.7)
NEUTROS PCT: 71 %
Platelets: 173 10*3/uL (ref 150–400)
RBC: 4.81 MIL/uL (ref 4.22–5.81)
RDW: 14.3 % (ref 11.5–15.5)
WBC: 10.5 10*3/uL (ref 4.0–10.5)

## 2016-11-05 LAB — LACTATE DEHYDROGENASE: LDH: 154 U/L (ref 98–192)

## 2016-11-05 MED ORDER — PALONOSETRON HCL INJECTION 0.25 MG/5ML: 0.2500 mg | Freq: Once | INTRAVENOUS | Status: DC

## 2016-11-05 MED ORDER — BORTEZOMIB CHEMO SQ INJECTION 3.5 MG (2.5MG/ML)
1.3000 mg/m2 | Freq: Once | INTRAMUSCULAR | Status: AC
  Administered 2016-11-05: 2.5 mg via SUBCUTANEOUS
  Filled 2016-11-05: qty 2.5

## 2016-11-05 NOTE — Progress Notes (Signed)
Brian Liu tolerated Velcade injection well without complaints or incident. Labs reviewed with Dr. Talbert Cage prior to administering chemo injectio.Pt took his own Decadron pills while here as prescribed. Pt has not received his Cytoxan pills yet so he can start taking them next week per Kirby Crigler PA-C. Pt reminded to take his Decadron 2 pills Thurs and bring his pills back on Friday so we can make sure he is taking everything correctly. Pt verbalized understanding. Pt discharged self ambulatory in satisfactory condition

## 2016-11-05 NOTE — Patient Instructions (Signed)
[facility] Discharge Instructions for Patients Receiving Chemotherapy   Beginning January 23rd 2017 lab work for the Cancer Center will be done in the  Main lab at [place] on 1st floor. If you have a lab appointment with the Cancer Center please come in thru the  Main Entrance and check in at the main information desk   Today you received the following chemotherapy agents Velcade injection. Follow-up as scheduled. Call clinic for any questions or concerns  To help prevent nausea and vomiting after your treatment, we encourage you to take your nausea medication   If you develop nausea and vomiting, or diarrhea that is not controlled by your medication, call the clinic.  The clinic phone number is [phone]. Office hours are Monday-Friday 8:30am-5:00pm.  BELOW ARE SYMPTOMS THAT SHOULD BE REPORTED IMMEDIATELY:  *FEVER GREATER THAN 101.0 F  *CHILLS WITH OR WITHOUT FEVER  NAUSEA AND VOMITING THAT IS NOT CONTROLLED WITH YOUR NAUSEA MEDICATION  *UNUSUAL SHORTNESS OF BREATH  *UNUSUAL BRUISING OR BLEEDING  TENDERNESS IN MOUTH AND THROAT WITH OR WITHOUT PRESENCE OF ULCERS  *URINARY PROBLEMS  *BOWEL PROBLEMS  UNUSUAL RASH Items with * indicate a potential emergency and should be followed up as soon as possible. If you have an emergency after office hours please contact your primary care physician or go to the nearest emergency department.  Please call the clinic during office hours if you have any questions or concerns.   You may also contact the Patient Navigator at [phone] should you have any questions or need assistance in obtaining follow up care.      Resources For Cancer Patients and their Caregivers ? American Cancer Society: Can assist with transportation, wigs, general needs, runs Look Good Feel Better.        1-888-227-6333 ? Cancer Care: Provides financial assistance, online support groups, medication/co-pay assistance.   1-800-813-HOPE (4673) ? [facility] Assists [place] cancer patients and their families through emotional , educational and financial support.  336-427-4357 ? [place] DSS Where to apply for food stamps, Medicaid and utility assistance. 336-342-1394 ? RCATS: Transportation to medical appointments. 336-347-2287 ? Social Security Administration: May apply for disability if have a Stage IV cancer. 336-342-7796 1-800-772-1213 ? [place] Aging, Disability and Transit Services: Assists with nutrition, care and transit needs. 336-349-2343         

## 2016-11-06 ENCOUNTER — Telehealth (HOSPITAL_COMMUNITY): Payer: Self-pay | Admitting: *Deleted

## 2016-11-06 ENCOUNTER — Other Ambulatory Visit (HOSPITAL_COMMUNITY): Payer: Self-pay | Admitting: Pharmacist

## 2016-11-06 DIAGNOSIS — N2581 Secondary hyperparathyroidism of renal origin: Secondary | ICD-10-CM | POA: Diagnosis not present

## 2016-11-06 DIAGNOSIS — Z992 Dependence on renal dialysis: Secondary | ICD-10-CM | POA: Diagnosis not present

## 2016-11-06 DIAGNOSIS — N186 End stage renal disease: Secondary | ICD-10-CM | POA: Diagnosis not present

## 2016-11-06 DIAGNOSIS — D509 Iron deficiency anemia, unspecified: Secondary | ICD-10-CM | POA: Diagnosis not present

## 2016-11-06 DIAGNOSIS — D472 Monoclonal gammopathy: Secondary | ICD-10-CM | POA: Diagnosis not present

## 2016-11-06 LAB — PROTEIN ELECTROPHORESIS, SERUM
A/G Ratio: 1.1 (ref 0.7–1.7)
ALPHA-2-GLOBULIN: 1 g/dL (ref 0.4–1.0)
Albumin ELP: 4.4 g/dL (ref 2.9–4.4)
Alpha-1-Globulin: 0.2 g/dL (ref 0.0–0.4)
BETA GLOBULIN: 0.8 g/dL (ref 0.7–1.3)
GLOBULIN, TOTAL: 4 g/dL — AB (ref 2.2–3.9)
Gamma Globulin: 2 g/dL — ABNORMAL HIGH (ref 0.4–1.8)
M-SPIKE, %: 0.4 g/dL — AB
Total Protein ELP: 8.4 g/dL (ref 6.0–8.5)

## 2016-11-06 LAB — IGG, IGA, IGM
IGA: 239 mg/dL (ref 61–437)
IGG (IMMUNOGLOBIN G), SERUM: 1889 mg/dL — AB (ref 700–1600)
IgM, Serum: 146 mg/dL (ref 20–172)

## 2016-11-06 LAB — KAPPA/LAMBDA LIGHT CHAINS
KAPPA FREE LGHT CHN: 164.3 mg/L — AB (ref 3.3–19.4)
Kappa, lambda light chain ratio: 1.09 (ref 0.26–1.65)
LAMDA FREE LIGHT CHAINS: 150.3 mg/L — AB (ref 5.7–26.3)

## 2016-11-06 LAB — BETA 2 MICROGLOBULIN, SERUM: Beta-2 Microglobulin: 18.1 mg/L — ABNORMAL HIGH (ref 0.6–2.4)

## 2016-11-06 NOTE — Telephone Encounter (Signed)
24h call back: Patient is doing okay post velcade injection. He is supposed to be getting his Cytoxan chemo pills today or tomorrow. Pt will bring those with him to his appt. Pt has his 2 nausea meds at home if he needs to take them. I told him to call us with any questions/problems. He said ok.

## 2016-11-07 DIAGNOSIS — E7800 Pure hypercholesterolemia, unspecified: Secondary | ICD-10-CM | POA: Diagnosis not present

## 2016-11-07 DIAGNOSIS — I10 Essential (primary) hypertension: Secondary | ICD-10-CM | POA: Diagnosis not present

## 2016-11-08 ENCOUNTER — Encounter (HOSPITAL_COMMUNITY): Payer: Self-pay

## 2016-11-08 ENCOUNTER — Encounter (HOSPITAL_BASED_OUTPATIENT_CLINIC_OR_DEPARTMENT_OTHER): Payer: Medicare Other

## 2016-11-08 ENCOUNTER — Other Ambulatory Visit (HOSPITAL_COMMUNITY): Payer: Medicare Other

## 2016-11-08 VITALS — BP 102/60 | HR 72 | Temp 97.8°F | Resp 18

## 2016-11-08 DIAGNOSIS — D472 Monoclonal gammopathy: Secondary | ICD-10-CM | POA: Diagnosis not present

## 2016-11-08 DIAGNOSIS — C9000 Multiple myeloma not having achieved remission: Secondary | ICD-10-CM

## 2016-11-08 DIAGNOSIS — N2581 Secondary hyperparathyroidism of renal origin: Secondary | ICD-10-CM | POA: Diagnosis not present

## 2016-11-08 DIAGNOSIS — Z992 Dependence on renal dialysis: Secondary | ICD-10-CM | POA: Diagnosis not present

## 2016-11-08 DIAGNOSIS — D509 Iron deficiency anemia, unspecified: Secondary | ICD-10-CM | POA: Diagnosis not present

## 2016-11-08 DIAGNOSIS — N186 End stage renal disease: Secondary | ICD-10-CM | POA: Diagnosis not present

## 2016-11-08 DIAGNOSIS — Z5112 Encounter for antineoplastic immunotherapy: Secondary | ICD-10-CM

## 2016-11-08 LAB — IMMUNOFIXATION ELECTROPHORESIS
IGA: UNDETERMINED mg/dL
IGG (IMMUNOGLOBIN G), SERUM: UNDETERMINED mg/dL
IGM, SERUM: UNDETERMINED mg/dL
Immunofixation Result, Serum: UNDETERMINED
Total Protein ELP: 8.3 g/dL (ref 6.0–8.5)

## 2016-11-08 MED ORDER — ONDANSETRON HCL 4 MG PO TABS
8.0000 mg | ORAL_TABLET | Freq: Once | ORAL | Status: AC
  Administered 2016-11-08: 8 mg via ORAL
  Filled 2016-11-08: qty 2

## 2016-11-08 MED ORDER — BORTEZOMIB CHEMO SQ INJECTION 3.5 MG (2.5MG/ML)
1.3000 mg/m2 | Freq: Once | INTRAMUSCULAR | Status: AC
  Administered 2016-11-08: 2.5 mg via SUBCUTANEOUS
  Filled 2016-11-08: qty 2.5

## 2016-11-08 NOTE — Patient Instructions (Signed)
[facility] Discharge Instructions for Patients Receiving Chemotherapy   Beginning January 23rd 2017 lab work for the Cancer Center will be done in the  Main lab at [place] on 1st floor. If you have a lab appointment with the Cancer Center please come in thru the  Main Entrance and check in at the main information desk   Today you received the following chemotherapy agents Velcade. Follow-up as scheduled. Call clinic for any questions or concerns  To help prevent nausea and vomiting after your treatment, we encourage you to take your nausea medication   If you develop nausea and vomiting, or diarrhea that is not controlled by your medication, call the clinic.  The clinic phone number is [phone]. Office hours are Monday-Friday 8:30am-5:00pm.  BELOW ARE SYMPTOMS THAT SHOULD BE REPORTED IMMEDIATELY:  *FEVER GREATER THAN 101.0 F  *CHILLS WITH OR WITHOUT FEVER  NAUSEA AND VOMITING THAT IS NOT CONTROLLED WITH YOUR NAUSEA MEDICATION  *UNUSUAL SHORTNESS OF BREATH  *UNUSUAL BRUISING OR BLEEDING  TENDERNESS IN MOUTH AND THROAT WITH OR WITHOUT PRESENCE OF ULCERS  *URINARY PROBLEMS  *BOWEL PROBLEMS  UNUSUAL RASH Items with * indicate a potential emergency and should be followed up as soon as possible. If you have an emergency after office hours please contact your primary care physician or go to the nearest emergency department.  Please call the clinic during office hours if you have any questions or concerns.   You may also contact the Patient Navigator at [phone] should you have any questions or need assistance in obtaining follow up care.      Resources For Cancer Patients and their Caregivers ? American Cancer Society: Can assist with transportation, wigs, general needs, runs Look Good Feel Better.        1-888-227-6333 ? Cancer Care: Provides financial assistance, online support groups, medication/co-pay assistance.  1-800-813-HOPE  (4673) ? [facility] Assists [place] cancer patients and their families through emotional , educational and financial support.  336-427-4357 ? [place] DSS Where to apply for food stamps, Medicaid and utility assistance. 336-342-1394 ? RCATS: Transportation to medical appointments. 336-347-2287 ? Social Security Administration: May apply for disability if have a Stage IV cancer. 336-342-7796 1-800-772-1213 ? [place] Aging, Disability and Transit Services: Assists with nutrition, care and transit needs. [phone]         

## 2016-11-08 NOTE — Progress Notes (Signed)
Brian Liu tolerated Velcade injection well without complaints or incident. VSS Pt took his Decadron dose as prescribed today Pt discharged self ambulatory in satisfactory condition accompanied by family members

## 2016-11-11 DIAGNOSIS — D509 Iron deficiency anemia, unspecified: Secondary | ICD-10-CM | POA: Diagnosis not present

## 2016-11-11 DIAGNOSIS — N2581 Secondary hyperparathyroidism of renal origin: Secondary | ICD-10-CM | POA: Diagnosis not present

## 2016-11-11 DIAGNOSIS — D472 Monoclonal gammopathy: Secondary | ICD-10-CM | POA: Diagnosis not present

## 2016-11-11 DIAGNOSIS — N186 End stage renal disease: Secondary | ICD-10-CM | POA: Diagnosis not present

## 2016-11-11 DIAGNOSIS — Z992 Dependence on renal dialysis: Secondary | ICD-10-CM | POA: Diagnosis not present

## 2016-11-12 ENCOUNTER — Encounter (HOSPITAL_COMMUNITY): Payer: Self-pay | Admitting: Oncology

## 2016-11-12 ENCOUNTER — Encounter (HOSPITAL_BASED_OUTPATIENT_CLINIC_OR_DEPARTMENT_OTHER): Payer: Medicare Other

## 2016-11-12 ENCOUNTER — Other Ambulatory Visit (HOSPITAL_COMMUNITY): Payer: Medicare Other

## 2016-11-12 ENCOUNTER — Encounter (HOSPITAL_BASED_OUTPATIENT_CLINIC_OR_DEPARTMENT_OTHER): Payer: Medicare Other | Admitting: Oncology

## 2016-11-12 ENCOUNTER — Encounter (HOSPITAL_COMMUNITY): Payer: Medicare Other

## 2016-11-12 VITALS — BP 111/68 | HR 67 | Temp 97.5°F | Resp 18 | Ht 72.0 in | Wt 145.5 lb

## 2016-11-12 DIAGNOSIS — N186 End stage renal disease: Secondary | ICD-10-CM | POA: Diagnosis not present

## 2016-11-12 DIAGNOSIS — S61511A Laceration without foreign body of right wrist, initial encounter: Secondary | ICD-10-CM

## 2016-11-12 DIAGNOSIS — Z720 Tobacco use: Secondary | ICD-10-CM

## 2016-11-12 DIAGNOSIS — C9000 Multiple myeloma not having achieved remission: Secondary | ICD-10-CM

## 2016-11-12 DIAGNOSIS — Z5112 Encounter for antineoplastic immunotherapy: Secondary | ICD-10-CM | POA: Diagnosis present

## 2016-11-12 LAB — CBC WITH DIFFERENTIAL/PLATELET
BASOS ABS: 0 10*3/uL (ref 0.0–0.1)
BASOS PCT: 0 %
Eosinophils Absolute: 0.1 10*3/uL (ref 0.0–0.7)
Eosinophils Relative: 1 %
HEMATOCRIT: 39.6 % (ref 39.0–52.0)
Hemoglobin: 13.1 g/dL (ref 13.0–17.0)
Lymphocytes Relative: 24 %
Lymphs Abs: 2.1 10*3/uL (ref 0.7–4.0)
MCH: 26 pg (ref 26.0–34.0)
MCHC: 33.1 g/dL (ref 30.0–36.0)
MCV: 78.7 fL (ref 78.0–100.0)
MONO ABS: 0.7 10*3/uL (ref 0.1–1.0)
Monocytes Relative: 7 %
NEUTROS ABS: 5.9 10*3/uL (ref 1.7–7.7)
Neutrophils Relative %: 67 %
PLATELETS: 105 10*3/uL — AB (ref 150–400)
RBC: 5.03 MIL/uL (ref 4.22–5.81)
RDW: 14.6 % (ref 11.5–15.5)
WBC: 8.7 10*3/uL (ref 4.0–10.5)

## 2016-11-12 LAB — COMPREHENSIVE METABOLIC PANEL
ALBUMIN: 4 g/dL (ref 3.5–5.0)
ALT: 26 U/L (ref 17–63)
AST: 16 U/L (ref 15–41)
Alkaline Phosphatase: 54 U/L (ref 38–126)
Anion gap: 14 (ref 5–15)
BUN: 55 mg/dL — AB (ref 6–20)
CHLORIDE: 97 mmol/L — AB (ref 101–111)
CO2: 28 mmol/L (ref 22–32)
Calcium: 9.5 mg/dL (ref 8.9–10.3)
Creatinine, Ser: 8.76 mg/dL — ABNORMAL HIGH (ref 0.61–1.24)
GFR calc Af Amer: 7 mL/min — ABNORMAL LOW (ref >60)
GFR calc non Af Amer: 6 mL/min — ABNORMAL LOW (ref >60)
Glucose, Bld: 80 mg/dL (ref 65–99)
POTASSIUM: 4.1 mmol/L (ref 3.5–5.1)
Sodium: 139 mmol/L (ref 135–145)
Total Bilirubin: 0.6 mg/dL (ref 0.3–1.2)
Total Protein: 8.3 g/dL — ABNORMAL HIGH (ref 6.5–8.1)

## 2016-11-12 MED ORDER — BORTEZOMIB CHEMO SQ INJECTION 3.5 MG (2.5MG/ML)
1.3000 mg/m2 | Freq: Once | INTRAMUSCULAR | Status: AC
  Administered 2016-11-12: 2.5 mg via SUBCUTANEOUS
  Filled 2016-11-12: qty 2.5

## 2016-11-12 MED ORDER — PALONOSETRON HCL INJECTION 0.25 MG/5ML
0.2500 mg | Freq: Once | INTRAVENOUS | Status: AC
  Administered 2016-11-12: 0.25 mg via INTRAVENOUS

## 2016-11-12 MED ORDER — PALONOSETRON HCL INJECTION 0.25 MG/5ML
INTRAVENOUS | Status: AC
  Filled 2016-11-12: qty 5

## 2016-11-12 MED ORDER — SODIUM CHLORIDE 0.9 % IV SOLN
INTRAVENOUS | Status: DC
  Administered 2016-11-12: 15:00:00 via INTRAVENOUS

## 2016-11-12 NOTE — Assessment & Plan Note (Addendum)
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  He has been seen by Dr. Norma Fredrickson.  On hemodialysis on M-W-F (6-10 AM).  He started CyBorD on 11/05/2016.  Oncology history is updated.    Staging in [facility] problem list is updated.  I have reviewed Dr. Norma Fredrickson note from [facility] on 10/04/2016:  IgG kappa myeloma and possible amyloid: The bone marrow biopsy findings of amyloid are not consistent with typical pattern of amyloid and represent formalin pigments. Recommend the block be sent to the [facility] for mass spectrometry and further characterization. We will perform a fat pad biopsy based on abnormal troponin and BNP that may be suspicious for infiltrative process. Will schedule Mr. Ferrucci for a 2D echo. He will RTC in 4 weeks to go over his results and determine final diagnosis. His lytic lesions and amount of clonal plasma cells in the marrow are consistent with active myeloma but will benefit from clarification on amyloid involvement before starting any therapy.   I have reviewed CareEverywhere and there is an urgent referral for cardiology at [facility] for possible cardiac involvement of amyloid.  Labs today: CBC diff, CMET.  I personally reviewed and went over laboratory results with the patient.  The results are noted within this dictation.  Mild thrombocytopenia is noted, but he has a minimal thrombocytopenia at baseline.  Will need to monitor moving forward.  Treatment parameters satisfied today.  I reviewed the risks, benefits, alternatives, and side effects of CyborD including, but not limited to, peripheral edema, DVT, hypertension, peripheral neuropathy, fatigue, arthralgia, headaches, paresthesia, dizziness, skin rash, diarrhea, nausea, vomiting, anorexia, abdominal pain, decreased appetite, pancytopenia, neutropenia, leukopenia, hemorrhage, increased risk for infection, weakness, dyspnea, increased risk for pneumonia, and fever, alopecia, altered hormone level,  amenorrhea, abdominal pain, anorexia, diarrhea, mucositis, nausea and vomiting, stomatitis, azoospermia, hemorrhagic cystitis, sterility, anemia, bone marrow depression, febrile neutropenia, leukopenia, neutropenia, pancytopenia, and infection, anxiety, insomnia, agitation, death.  Will wait for further recommendations from Dr. Norma Fredrickson in the future.  Labs on Day 1 each cycle: CBC diff, CMET, SPEP +IFE, light chain assay, and B2M.  Labs weekly: CBC diff, CMET.  Problem list reviewed with patient and edited accordingly.  Medications are reviewed with the patient and edited accordingly.  Return in 2 weeks for follow-up.  More than 50% of the time spent with the patient was utilized for counseling and coordination of care.

## 2016-11-12 NOTE — Patient Instructions (Signed)
[facility] Discharge Instructions for Patients Receiving Chemotherapy   Beginning January 23rd 2017 lab work for the Cancer Center will be done in the  Main lab at [place] on 1st floor. If you have a lab appointment with the Cancer Center please come in thru the  Main Entrance and check in at the main information desk   Today you received the following chemotherapy agents   To help prevent nausea and vomiting after your treatment, we encourage you to take your nausea medication     If you develop nausea and vomiting, or diarrhea that is not controlled by your medication, call the clinic.  The clinic phone number is [phone]. Office hours are Monday-Friday 8:30am-5:00pm.  BELOW ARE SYMPTOMS THAT SHOULD BE REPORTED IMMEDIATELY:  *FEVER GREATER THAN 101.0 F  *CHILLS WITH OR WITHOUT FEVER  NAUSEA AND VOMITING THAT IS NOT CONTROLLED WITH YOUR NAUSEA MEDICATION  *UNUSUAL SHORTNESS OF BREATH  *UNUSUAL BRUISING OR BLEEDING  TENDERNESS IN MOUTH AND THROAT WITH OR WITHOUT PRESENCE OF ULCERS  *URINARY PROBLEMS  *BOWEL PROBLEMS  UNUSUAL RASH Items with * indicate a potential emergency and should be followed up as soon as possible. If you have an emergency after office hours please contact your primary care physician or go to the nearest emergency department.  Please call the clinic during office hours if you have any questions or concerns.   You may also contact the Patient Navigator at [phone] should you have any questions or need assistance in obtaining follow up care.      Resources For Cancer Patients and their Caregivers ? American Cancer Society: Can assist with transportation, wigs, general needs, runs Look Good Feel Better.        1-888-227-6333 ? Cancer Care: Provides financial assistance, online support groups, medication/co-pay assistance.  1-800-813-HOPE (4673) ? [facility] Assists [place] cancer  patients and their families through emotional , educational and financial support.  336-427-4357 ? [place] DSS Where to apply for food stamps, Medicaid and utility assistance. 336-342-1394 ? RCATS: Transportation to medical appointments. 336-347-2287 ? Social Security Administration: May apply for disability if have a Stage IV cancer. 336-342-7796 1-800-772-1213 ? [place] Aging, Disability and Transit Services: Assists with nutrition, care and transit needs. [phone]         

## 2016-11-12 NOTE — Patient Instructions (Addendum)
[place] [facility] Discharge Instructions  RECOMMENDATIONS MADE BY THE CONSULTANT AND ANY TEST RESULTS WILL BE SENT TO YOUR REFERRING PHYSICIAN.  You were seen today by Kirby Crigler PA-C. Velcade today. Continue treatment as planned, return as scheduled.  Thank you for choosing [place] at [facility] to provide your oncology and hematology care.  To afford each patient quality time with our provider, please arrive at least 15 minutes before your scheduled appointment time.    If you have a lab appointment with the [facility] please come in thru the  Main Entrance and check in at the main information desk  You need to re-schedule your appointment should you arrive 10 or more minutes late.  We strive to give you quality time with our providers, and arriving late affects you and other patients whose appointments are after yours.  Also, if you no show three or more times for appointments you may be dismissed from the clinic at the providers discretion.     Again, thank you for choosing [facility].  Our hope is that these requests will decrease the amount of time that you wait before being seen by our physicians.       _____________________________________________________________  Should you have questions after your visit to [facility], please contact our office at [phone] between the hours of 8:30 a.m. and 4:30 p.m.  Voicemails left after 4:30 p.m. will not be returned until the following business day.  For prescription refill requests, have your pharmacy contact our office.       Resources For Cancer Patients and their Caregivers ? American Cancer Society: Can assist with transportation, wigs, general needs, runs Look Good Feel Better.        (231)165-1398 ? Cancer Care: Provides financial assistance, online support groups, medication/co-pay assistance.  1-800-813-HOPE (267)783-2379) ? [facility] Assists [place] cancer patients and their families through emotional , educational and financial support.  8632579898 ? [place] DSS Where to apply for food stamps, Medicaid and utility assistance. (646) 619-4495 ? RCATS: Transportation to medical appointments. 352-825-6893 ? Social Security Administration: May apply for disability if have a Stage IV cancer. 423-094-4422 405-547-7455 ? [employer], Disability and Transit Services: Assists with nutrition, care and transit needs. [place] Support Programs: @10RELATIVEDAYS @ > Cancer Support Group  2nd Tuesday of the month 1pm-2pm, Journey Room  > Creative Journey  3rd Tuesday of the month 1130am-1pm, Journey Room  > Look Good Feel Better  1st Wednesday of the month 10am-12 noon, Journey Room (Call [facility] to register [phone])

## 2016-11-12 NOTE — Assessment & Plan Note (Addendum)
ESRD, on hemodialysis (M-W-F 6AM- 10AM). Cyclophosphamide dose reduced accordingly.

## 2016-11-12 NOTE — Progress Notes (Signed)
Labs reviewed with MD, proceed with treatment.  Brian Liu presents today for injection per MD orders. Velcade 2.5mg  administered SQ in left Abdomen. Administration without incident. Patient tolerated well.   Patient was given premeds, patient brought his own medications from home today. Went back over his calender with him. He took his dexamethasone and his cytoxan as prescribed while here for his velcade injection. Grandson was present in room as I re-instructed patient on his calender and medications.Vitals stable and discharged home ambulatory from clinic. Follow up as scheduled.

## 2016-11-12 NOTE — Progress Notes (Signed)
Brian Chroman, MD [place] [address]  ESRD (end stage renal disease) [facility])  Multiple myeloma not having achieved remission ([place])  Tobacco abuse  CURRENT THERAPY: CyBorD beginning on 11/05/2016  INTERVAL HISTORY: Brian Liu 66 y.o. 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  He has been seen by Dr. Norma Fredrickson at [facility].  He is now on CyBorD beginning on 11/05/2016 at the request of Dr. Norma Fredrickson.  He is doing very well.  He is tolerating therapy well without any difficulties.  He denies any nausea or vomiting.  He reports compliance with his chemotherapeutic agents at home.  He does have a calendar to help illustrate when he is due to take cyclophosphamide and dexamethasone.    He reports having a cardiology appointment in May at [facility].  This is also noted in care everywhere on my review chart.  He denies any issues with constipation or diarrhea.  He denies any denies any hospitalizations or need for antibiotics.  On exam, of note 3 small skin tears less than 1 cm in size on the dorsal aspect of his right wrist.  He notes this is secondary to minor trauma.  Appetite and weight are stable.  Review of Systems  Constitutional: Negative.  Negative for chills, fever and weight loss.  HENT: Negative.   Eyes: Negative.   Respiratory: Negative.  Negative for cough.   Cardiovascular: Negative.  Negative for chest pain.  Gastrointestinal: Negative.  Negative for blood in stool, constipation, diarrhea, melena, nausea and vomiting.  Genitourinary: Negative.   Musculoskeletal: Negative.   Skin: Negative.   Neurological: Negative.  Negative for weakness.  Endo/Heme/Allergies: Negative.   Psychiatric/Behavioral: Negative.     Past Medical History:  Diagnosis Date  . Atrial fibrillation (HCC)    Permanent, Not on Coumadin because of history of lower GI bleed  . Diastolic dysfunction    Restrictive   diastolic filling pattern echo,  February, 2013  . Dyslipidemia   . Ejection fraction    EF 60-65%, echo, 2013  . ESRD (end stage renal disease) ([place])    Dialysis, fistula in left arm  . Hypertension   . Lower gastrointestinal bleed   . LVH (left ventricular hypertrophy)   . 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 Mitral regurgitation    Mild by echo, 2013  . Multiple myeloma ([place]) 04/30/2016  . Pancytopenia ([place])    Dr Ignacia Bayley  . Pulmonary hypertension    Severe, PA pressure 65-70 mm of mercury, echo, 2013  . Tobacco abuse     Past Surgical History:  Procedure Laterality Date  . AV FISTULA PLACEMENT Left 1992   Left arm   . 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 Other Mother     Deceased, 22  . Stomach cancer Father     Deceased, 24  . Kidney failure Son   . Healthy Son     Social History   Social History  . Marital status: Widowed    Spouse name: N/A  . Number of children: N/A  . Years of education: N/A   Social History Main Topics  . Smoking status: Never Smoker  . Smokeless tobacco: Never Used  . Alcohol use No  . Drug use: No  . Sexual activity: Not  Asked   Other Topics Concern  . None   Social History Narrative   Lives alone in a one story home.  Has 2 sons.     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     Education high school.      PHYSICAL EXAMINATION  ECOG PERFORMANCE STATUS: 1 - Symptomatic but completely ambulatory  Vitals:   11/12/16 1337  BP: 111/68  Pulse: 67  Resp: 18  Temp: 97.5 F (36.4 C)    GENERAL:alert, no distress, well developed, comfortable, cooperative, smiling and accompanied by his son, Key. SKIN: skin color, texture, turgor are normal, no rashes or significant lesions.  He has three small (1 cm) skin tears on right dorsal aspect of wrist. HEAD:  Normocephalic, No masses, lesions, tenderness or abnormalities EYES: normal EARS: External ears normal OROPHARYNX:lips, buccal mucosa, and tongue normal and mucous membranes are moist  NECK: supple, trachea midline LYMPH:  no palpable lymphadenopathy BREAST:not examined LUNGS: clear to auscultation without wheezes, rales, rhonchi. HEART: regular rate & rhythm and systolic ejection murmur heard best in LSB, 2nd ICS ABDOMEN:abdomen soft and normal bowel sounds BACK: Back symmetric, no curvature. EXTREMITIES:less then 2 second capillary refill, no joint deformities, effusion, or inflammation, no skin discoloration, no cyanosis  NEURO: alert & oriented x 3 with fluent speech, no focal motor/sensory deficits, gait normal   LABORATORY DATA: CBC    Component Value Date/Time   WBC 8.7 11/12/2016 1207   RBC 5.03 11/12/2016 1207   HGB 13.1 11/12/2016 1207   HCT 39.6 11/12/2016 1207   PLT 105 (L) 11/12/2016 1207   MCV 78.7 11/12/2016 1207   MCH 26.0 11/12/2016 1207   MCHC 33.1 11/12/2016 1207   RDW 14.6 11/12/2016 1207   LYMPHSABS 2.1 11/12/2016 1207   MONOABS 0.7 11/12/2016 1207   EOSABS 0.1 11/12/2016 1207   BASOSABS 0.0 11/12/2016 1207      Chemistry      Component Value Date/Time   NA 139 11/12/2016 1207   K 4.1 11/12/2016 1207   CL 97 (L) 11/12/2016 1207   CO2 28 11/12/2016 1207   BUN 55 (H) 11/12/2016 1207   CREATININE 8.76 (H) 11/12/2016 1207      Component Value Date/Time   CALCIUM 9.5 11/12/2016 1207   ALKPHOS 54 11/12/2016 1207   AST 16 11/12/2016 1207   ALT 26 11/12/2016 1207   BILITOT 0.6 11/12/2016 1207     Lab Results  Component Value Date   PROT 8.3 (H) 11/12/2016   ALBUMINELP 4.4 11/05/2016   A1GS 0.2 11/05/2016   A2GS 1.0 11/05/2016   BETS 0.8 11/05/2016   GAMS 2.0 (H) 11/05/2016   MSPIKE 0.4 (H) 11/05/2016   SPEI Comment 11/05/2016   SPECOM Comment 11/05/2016   IGGSERUM 1,889 (H) 11/05/2016   IGGSERUM QUANTITY NOT SUFFICIENT, UNABLE TO PERFORM  TEST 11/05/2016   IGA 239 11/05/2016   IGA QUANTITY NOT SUFFICIENT, UNABLE TO PERFORM TEST 11/05/2016   IGMSERUM 146 11/05/2016   IGMSERUM QUANTITY NOT SUFFICIENT, UNABLE TO PERFORM TEST 11/05/2016   KPAFRELGTCHN 164.3 (H) 11/05/2016   LAMBDASER 150.3 (H) 11/05/2016   KAPLAMBRATIO 1.09 11/05/2016    PENDING LABS:   RADIOGRAPHIC STUDIES:  No results found.   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  He has been seen by Dr. Norma Fredrickson.  On hemodialysis on M-W-F (6-10 AM).  He started  CyBorD on 11/05/2016.  Oncology history is updated.    Staging in [facility] problem list is updated.  I have reviewed Dr. Norma Fredrickson note from [facility] on 10/04/2016:  IgG kappa myeloma and possible amyloid: The bone marrow biopsy findings of amyloid are not consistent with typical pattern of amyloid and represent formalin pigments. Recommend the block be sent to the [facility] for mass spectrometry and further characterization. We will perform a fat pad biopsy based on abnormal troponin and BNP that may be suspicious for infiltrative process. Will schedule Mr. Klepper for a 2D echo. He will RTC in 4 weeks to go over his results and determine final diagnosis. His lytic lesions and amount of clonal plasma cells in the marrow are consistent with active myeloma but will benefit from clarification on amyloid involvement before starting any therapy.   I have reviewed CareEverywhere and there is an urgent referral for cardiology at [facility] for possible cardiac involvement of amyloid.  Labs today: CBC diff, CMET.  I personally reviewed and went over laboratory results with the patient.  The results are noted within this dictation.  Mild thrombocytopenia is noted, but he has a minimal thrombocytopenia at baseline.  Will need to monitor moving forward.  Treatment parameters satisfied today.  I reviewed the risks, benefits, alternatives,  and side effects of CyborD including, but not limited to, peripheral edema, DVT, hypertension, peripheral neuropathy, fatigue, arthralgia, headaches, paresthesia, dizziness, skin rash, diarrhea, nausea, vomiting, anorexia, abdominal pain, decreased appetite, pancytopenia, neutropenia, leukopenia, hemorrhage, increased risk for infection, weakness, dyspnea, increased risk for pneumonia, and fever, alopecia, altered hormone level, amenorrhea, abdominal pain, anorexia, diarrhea, mucositis, nausea and vomiting, stomatitis, azoospermia, hemorrhagic cystitis, sterility, anemia, bone marrow depression, febrile neutropenia, leukopenia, neutropenia, pancytopenia, and infection, anxiety, insomnia, agitation, death.  Will wait for further recommendations from Dr. Norma Fredrickson in the future.  Labs on Day 1 each cycle: CBC diff, CMET, SPEP +IFE, light chain assay, and B2M.  Labs weekly: CBC diff, CMET.  Problem list reviewed with patient and edited accordingly.  Medications are reviewed with the patient and edited accordingly.  Return in 2 weeks for follow-up.  More than 50% of the time spent with the patient was utilized for counseling and coordination of care.  ESRD (end stage renal disease) ([place]) ESRD, on hemodialysis (M-W-F 6AM- 10AM). Cyclophosphamide dose reduced accordingly.    ORDERS PLACED FOR THIS ENCOUNTER: No orders of the defined types were placed in this encounter.   MEDICATIONS PRESCRIBED THIS ENCOUNTER: Meds ordered this encounter  Medications  . midodrine (PROAMATINE) 10 MG tablet    THERAPY PLAN:  Will start CyborD soon as outlined above while awaiting further recommendations from [facility].  All questions were answered. The patient knows to call the clinic with any problems, questions or concerns. We can certainly see the patient much sooner if necessary.  Patient and plan discussed with Dr. Twana First and she is in agreement with the aforementioned.   This note is electronically  signed by: Robynn Pane, PA-C 11/12/2016 2:00 PM

## 2016-11-13 DIAGNOSIS — D472 Monoclonal gammopathy: Secondary | ICD-10-CM | POA: Diagnosis not present

## 2016-11-13 DIAGNOSIS — Z992 Dependence on renal dialysis: Secondary | ICD-10-CM | POA: Diagnosis not present

## 2016-11-13 DIAGNOSIS — N2581 Secondary hyperparathyroidism of renal origin: Secondary | ICD-10-CM | POA: Diagnosis not present

## 2016-11-13 DIAGNOSIS — N186 End stage renal disease: Secondary | ICD-10-CM | POA: Diagnosis not present

## 2016-11-13 DIAGNOSIS — D509 Iron deficiency anemia, unspecified: Secondary | ICD-10-CM | POA: Diagnosis not present

## 2016-11-15 ENCOUNTER — Encounter (HOSPITAL_BASED_OUTPATIENT_CLINIC_OR_DEPARTMENT_OTHER): Payer: Medicare Other

## 2016-11-15 ENCOUNTER — Other Ambulatory Visit (HOSPITAL_COMMUNITY): Payer: Medicare Other

## 2016-11-15 ENCOUNTER — Encounter (HOSPITAL_COMMUNITY): Payer: Self-pay

## 2016-11-15 VITALS — BP 107/77 | HR 70 | Temp 97.8°F | Resp 18

## 2016-11-15 DIAGNOSIS — Z5112 Encounter for antineoplastic immunotherapy: Secondary | ICD-10-CM

## 2016-11-15 DIAGNOSIS — C9000 Multiple myeloma not having achieved remission: Secondary | ICD-10-CM | POA: Diagnosis not present

## 2016-11-15 DIAGNOSIS — H401123 Primary open-angle glaucoma, left eye, severe stage: Secondary | ICD-10-CM | POA: Diagnosis not present

## 2016-11-15 MED ORDER — ONDANSETRON HCL 4 MG PO TABS
8.0000 mg | ORAL_TABLET | Freq: Once | ORAL | Status: AC
  Administered 2016-11-15: 8 mg via ORAL

## 2016-11-15 MED ORDER — ONDANSETRON HCL 4 MG PO TABS
ORAL_TABLET | ORAL | Status: AC
Start: 2016-11-15 — End: 2016-11-15
  Filled 2016-11-15: qty 2

## 2016-11-15 MED ORDER — BORTEZOMIB CHEMO SQ INJECTION 3.5 MG (2.5MG/ML)
1.3000 mg/m2 | Freq: Once | INTRAMUSCULAR | Status: AC
  Administered 2016-11-15: 2.5 mg via SUBCUTANEOUS
  Filled 2016-11-15: qty 2.5

## 2016-11-15 NOTE — Progress Notes (Signed)
Brian Liu presents today for injection per MD orders. Velcade administered SQ in left Abdomen. Administration without incident. Patient tolerated well.

## 2016-11-15 NOTE — Patient Instructions (Signed)
[facility] Discharge Instructions for Patients Receiving Chemotherapy   Beginning January 23rd 2017 lab work for the [facility] will be done in the  Main lab at [facility] on 1st floor. If you have a lab appointment with the [place] please come in thru the  Main Entrance and check in at the main information desk   Today you received the following chemotherapy agent: Velcade   If you develop nausea and vomiting, or diarrhea that is not controlled by your medication, call the clinic.  The clinic phone number is [phone]. Office hours are Monday-Friday 8:30am-5:00pm.  BELOW ARE SYMPTOMS THAT SHOULD BE REPORTED IMMEDIATELY:  *FEVER GREATER THAN 101.0 F  *CHILLS WITH OR WITHOUT FEVER  NAUSEA AND VOMITING THAT IS NOT CONTROLLED WITH YOUR NAUSEA MEDICATION  *UNUSUAL SHORTNESS OF BREATH  *UNUSUAL BRUISING OR BLEEDING  TENDERNESS IN MOUTH AND THROAT WITH OR WITHOUT PRESENCE OF ULCERS  *URINARY PROBLEMS  *BOWEL PROBLEMS  UNUSUAL RASH Items with * indicate a potential emergency and should be followed up as soon as possible. If you have an emergency after office hours please contact your primary care physician or go to the nearest emergency department.  Please call the clinic during office hours if you have any questions or concerns.   You may also contact the Patient Navigator at [phone] should you have any questions or need assistance in obtaining follow up care.      Resources For Cancer Patients and their Caregivers ? American Cancer Society: Can assist with transportation, wigs, general needs, runs Look Good Feel Better.        ([phone] ? Cancer Care: Provides financial assistance, online support groups, medication/co-pay assistance.  1-800-813-HOPE (608)792-5143) ? [place] Assists [place] cancer patients and their families through emotional , educational and financial support.   4161130539 ? [place] DSS Where to apply for food stamps, Medicaid and utility assistance. 763-859-9645 ? RCATS: Transportation to medical appointments. 878-487-4811 ? Social Security Administration: May apply for disability if have a Stage IV cancer. [phone] ? [employer], Disability and Transit Services: Assists with nutrition, care and transit needs. [phone]

## 2016-11-16 DIAGNOSIS — D509 Iron deficiency anemia, unspecified: Secondary | ICD-10-CM | POA: Diagnosis not present

## 2016-11-16 DIAGNOSIS — N2581 Secondary hyperparathyroidism of renal origin: Secondary | ICD-10-CM | POA: Diagnosis not present

## 2016-11-16 DIAGNOSIS — D472 Monoclonal gammopathy: Secondary | ICD-10-CM | POA: Diagnosis not present

## 2016-11-16 DIAGNOSIS — N186 End stage renal disease: Secondary | ICD-10-CM | POA: Diagnosis not present

## 2016-11-16 DIAGNOSIS — Z992 Dependence on renal dialysis: Secondary | ICD-10-CM | POA: Diagnosis not present

## 2016-11-18 DIAGNOSIS — Z992 Dependence on renal dialysis: Secondary | ICD-10-CM | POA: Diagnosis not present

## 2016-11-18 DIAGNOSIS — D472 Monoclonal gammopathy: Secondary | ICD-10-CM | POA: Diagnosis not present

## 2016-11-18 DIAGNOSIS — N186 End stage renal disease: Secondary | ICD-10-CM | POA: Diagnosis not present

## 2016-11-18 DIAGNOSIS — D509 Iron deficiency anemia, unspecified: Secondary | ICD-10-CM | POA: Diagnosis not present

## 2016-11-18 DIAGNOSIS — N2581 Secondary hyperparathyroidism of renal origin: Secondary | ICD-10-CM | POA: Diagnosis not present

## 2016-11-19 ENCOUNTER — Encounter (HOSPITAL_COMMUNITY): Payer: Medicare Other

## 2016-11-19 ENCOUNTER — Telehealth (HOSPITAL_COMMUNITY): Payer: Self-pay | Admitting: Emergency Medicine

## 2016-11-19 DIAGNOSIS — C9000 Multiple myeloma not having achieved remission: Secondary | ICD-10-CM | POA: Diagnosis not present

## 2016-11-19 LAB — COMPREHENSIVE METABOLIC PANEL
ALBUMIN: 3.6 g/dL (ref 3.5–5.0)
ALK PHOS: 49 U/L (ref 38–126)
ALT: 18 U/L (ref 17–63)
AST: 14 U/L — AB (ref 15–41)
Anion gap: 15 (ref 5–15)
BILIRUBIN TOTAL: 0.6 mg/dL (ref 0.3–1.2)
BUN: 35 mg/dL — ABNORMAL HIGH (ref 6–20)
CALCIUM: 8.9 mg/dL (ref 8.9–10.3)
CO2: 28 mmol/L (ref 22–32)
CREATININE: 7.78 mg/dL — AB (ref 0.61–1.24)
Chloride: 95 mmol/L — ABNORMAL LOW (ref 101–111)
GFR calc Af Amer: 8 mL/min — ABNORMAL LOW (ref >60)
GFR calc non Af Amer: 6 mL/min — ABNORMAL LOW (ref >60)
GLUCOSE: 94 mg/dL (ref 65–99)
Potassium: 3.5 mmol/L (ref 3.5–5.1)
Sodium: 138 mmol/L (ref 135–145)
TOTAL PROTEIN: 7.3 g/dL (ref 6.5–8.1)

## 2016-11-19 LAB — CBC WITH DIFFERENTIAL/PLATELET
BASOS ABS: 0 10*3/uL (ref 0.0–0.1)
BASOS PCT: 0 %
Eosinophils Absolute: 0.1 10*3/uL (ref 0.0–0.7)
Eosinophils Relative: 1 %
HEMATOCRIT: 38.6 % — AB (ref 39.0–52.0)
Hemoglobin: 12.8 g/dL — ABNORMAL LOW (ref 13.0–17.0)
Lymphocytes Relative: 12 %
Lymphs Abs: 0.9 10*3/uL (ref 0.7–4.0)
MCH: 26 pg (ref 26.0–34.0)
MCHC: 33.2 g/dL (ref 30.0–36.0)
MCV: 78.5 fL (ref 78.0–100.0)
Monocytes Absolute: 0.5 10*3/uL (ref 0.1–1.0)
Monocytes Relative: 7 %
NEUTROS ABS: 6.1 10*3/uL (ref 1.7–7.7)
NEUTROS PCT: 80 %
Platelets: 24 10*3/uL — CL (ref 150–400)
RBC: 4.92 MIL/uL (ref 4.22–5.81)
RDW: 14.6 % (ref 11.5–15.5)
WBC: 7.7 10*3/uL (ref 4.0–10.5)

## 2016-11-19 NOTE — Telephone Encounter (Signed)
Called to tell pt to hold the cytoxan until he comes in to see the doctor next Tuesday.  Pt had already taken his weekly dose of cytoxan this morning.  I told him to hold it next Tuesday until he comes in to see the doctor.  He verbalized understanding.  I told him this was because his platelets were low today on his blood work.

## 2016-11-19 NOTE — Progress Notes (Signed)
CRITICAL VALUE ALERT Critical value received:  Platelets-24 Date of notification:  11/19/16 Time of notification: 7542 Critical value read back:  Yes.   Nurse who received alert:  M.Jaice Digioia, LPN MD notified (1st page):  T.Kefalas, PA-C

## 2016-11-20 DIAGNOSIS — Z992 Dependence on renal dialysis: Secondary | ICD-10-CM | POA: Diagnosis not present

## 2016-11-20 DIAGNOSIS — N186 End stage renal disease: Secondary | ICD-10-CM | POA: Diagnosis not present

## 2016-11-20 DIAGNOSIS — D472 Monoclonal gammopathy: Secondary | ICD-10-CM | POA: Diagnosis not present

## 2016-11-20 DIAGNOSIS — D509 Iron deficiency anemia, unspecified: Secondary | ICD-10-CM | POA: Diagnosis not present

## 2016-11-20 DIAGNOSIS — N2581 Secondary hyperparathyroidism of renal origin: Secondary | ICD-10-CM | POA: Diagnosis not present

## 2016-11-22 DIAGNOSIS — Z992 Dependence on renal dialysis: Secondary | ICD-10-CM | POA: Diagnosis not present

## 2016-11-22 DIAGNOSIS — N186 End stage renal disease: Secondary | ICD-10-CM | POA: Diagnosis not present

## 2016-11-22 DIAGNOSIS — N2581 Secondary hyperparathyroidism of renal origin: Secondary | ICD-10-CM | POA: Diagnosis not present

## 2016-11-22 DIAGNOSIS — D509 Iron deficiency anemia, unspecified: Secondary | ICD-10-CM | POA: Diagnosis not present

## 2016-11-22 DIAGNOSIS — D472 Monoclonal gammopathy: Secondary | ICD-10-CM | POA: Diagnosis not present

## 2016-11-25 DIAGNOSIS — N186 End stage renal disease: Secondary | ICD-10-CM | POA: Diagnosis not present

## 2016-11-25 DIAGNOSIS — D472 Monoclonal gammopathy: Secondary | ICD-10-CM | POA: Diagnosis not present

## 2016-11-25 DIAGNOSIS — D509 Iron deficiency anemia, unspecified: Secondary | ICD-10-CM | POA: Diagnosis not present

## 2016-11-25 DIAGNOSIS — N2581 Secondary hyperparathyroidism of renal origin: Secondary | ICD-10-CM | POA: Diagnosis not present

## 2016-11-25 DIAGNOSIS — Z992 Dependence on renal dialysis: Secondary | ICD-10-CM | POA: Diagnosis not present

## 2016-11-26 ENCOUNTER — Encounter (HOSPITAL_COMMUNITY): Payer: Medicare Other

## 2016-11-26 ENCOUNTER — Encounter (HOSPITAL_BASED_OUTPATIENT_CLINIC_OR_DEPARTMENT_OTHER): Payer: Medicare Other

## 2016-11-26 ENCOUNTER — Encounter (HOSPITAL_BASED_OUTPATIENT_CLINIC_OR_DEPARTMENT_OTHER): Payer: Medicare Other | Admitting: Oncology

## 2016-11-26 ENCOUNTER — Encounter (HOSPITAL_COMMUNITY): Payer: Self-pay | Admitting: Oncology

## 2016-11-26 VITALS — BP 92/59 | HR 71 | Temp 98.0°F | Resp 18 | Wt 149.9 lb

## 2016-11-26 DIAGNOSIS — Z992 Dependence on renal dialysis: Secondary | ICD-10-CM | POA: Diagnosis not present

## 2016-11-26 DIAGNOSIS — C9000 Multiple myeloma not having achieved remission: Secondary | ICD-10-CM

## 2016-11-26 DIAGNOSIS — N186 End stage renal disease: Secondary | ICD-10-CM | POA: Diagnosis not present

## 2016-11-26 DIAGNOSIS — Z5112 Encounter for antineoplastic immunotherapy: Secondary | ICD-10-CM | POA: Diagnosis present

## 2016-11-26 LAB — COMPREHENSIVE METABOLIC PANEL
ALK PHOS: 56 U/L (ref 38–126)
ALT: 19 U/L (ref 17–63)
AST: 17 U/L (ref 15–41)
Albumin: 3.8 g/dL (ref 3.5–5.0)
Anion gap: 13 (ref 5–15)
BILIRUBIN TOTAL: 0.8 mg/dL (ref 0.3–1.2)
BUN: 42 mg/dL — ABNORMAL HIGH (ref 6–20)
CALCIUM: 7.7 mg/dL — AB (ref 8.9–10.3)
CO2: 27 mmol/L (ref 22–32)
CREATININE: 8.33 mg/dL — AB (ref 0.61–1.24)
Chloride: 102 mmol/L (ref 101–111)
GFR calc non Af Amer: 6 mL/min — ABNORMAL LOW (ref >60)
GFR, EST AFRICAN AMERICAN: 7 mL/min — AB (ref >60)
GLUCOSE: 107 mg/dL — AB (ref 65–99)
Potassium: 3.8 mmol/L (ref 3.5–5.1)
SODIUM: 142 mmol/L (ref 135–145)
TOTAL PROTEIN: 7.6 g/dL (ref 6.5–8.1)

## 2016-11-26 LAB — CBC WITH DIFFERENTIAL/PLATELET
Basophils Absolute: 0 10*3/uL (ref 0.0–0.1)
Basophils Relative: 0 %
EOS ABS: 0.1 10*3/uL (ref 0.0–0.7)
Eosinophils Relative: 2 %
HEMATOCRIT: 34.8 % — AB (ref 39.0–52.0)
HEMOGLOBIN: 11.3 g/dL — AB (ref 13.0–17.0)
LYMPHS ABS: 0.8 10*3/uL (ref 0.7–4.0)
LYMPHS PCT: 14 %
MCH: 25.8 pg — AB (ref 26.0–34.0)
MCHC: 32.5 g/dL (ref 30.0–36.0)
MCV: 79.5 fL (ref 78.0–100.0)
MONOS PCT: 8 %
Monocytes Absolute: 0.4 10*3/uL (ref 0.1–1.0)
NEUTROS ABS: 4.3 10*3/uL (ref 1.7–7.7)
NEUTROS PCT: 76 %
Platelets: 177 10*3/uL (ref 150–400)
RBC: 4.38 MIL/uL (ref 4.22–5.81)
RDW: 15.4 % (ref 11.5–15.5)
WBC: 5.7 10*3/uL (ref 4.0–10.5)

## 2016-11-26 MED ORDER — CYCLOPHOSPHAMIDE 50 MG PO CAPS: ORAL_CAPSULE | ORAL | 5 refills | Status: DC

## 2016-11-26 MED ORDER — BORTEZOMIB CHEMO SQ INJECTION 3.5 MG (2.5MG/ML)
1.3000 mg/m2 | Freq: Once | INTRAMUSCULAR | Status: AC
  Administered 2016-11-26: 2.5 mg via SUBCUTANEOUS
  Filled 2016-11-26: qty 2.5

## 2016-11-26 MED ORDER — PALONOSETRON HCL INJECTION 0.25 MG/5ML
0.2500 mg | Freq: Once | INTRAVENOUS | Status: AC
Start: 2016-11-26 — End: 2016-11-26
  Administered 2016-11-26: 0.25 mg via INTRAVENOUS
  Filled 2016-11-26: qty 5

## 2016-11-26 NOTE — Assessment & Plan Note (Signed)
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  He has been seen by Dr. Norma Fredrickson.  On hemodialysis on M-W-F (6-10 AM).  He started CyBorD on 11/05/2016.  Cyclophosphamide was dose reduced for hemodialysis with further dose-reduction due to thrombocytopenia.  Oncology history is updated.    I have discussed the patient's case with Dr. Norma Fredrickson at [facility]  He reports that the patient has cardiology follow-up with them to rule out an cardio-infiltrative process.  The patient reports having follow-up with Dr. Norma Fredrickson in May 2018.  Labs today: CBC diff, CMET, SPEP + IFE, light chain assay, B2M.  I personally reviewed and went over laboratory results with the patient.  The results are noted within this dictation.  Mild thrombocytopenia at baseline, but with recent exacerbation, likely secondary to cyclophosphamide.  Now with resolution back to baseline being off cyclophosphamide for 1-2 weeks.  Cyclophosphamide dose is now reduced to 250 mg on days 1, 8, 15 every 21 days.  I have written a new Rx for cyclophosphamide for this dose reduction.  Rx is printed for provided to Angie for approval.  He will restart cyclophosphamide on 5/1 at this reduced dose.  Labs on Day 1 each cycle: CBC diff, CMET, SPEP +IFE, light chain assay, and B2M.  Labs weekly: CBC diff, CMET while on treatment.  Problem list reviewed with patient and edited accordingly.  Medications are reviewed with the patient and edited accordingly.  Return in 3 weeks for follow-up.  More than 50% of the time spent with the patient was utilized for counseling and coordination of care.

## 2016-11-26 NOTE — Progress Notes (Signed)
Venous access obtained with 23 g 3/4 inch butterfly needle to give Aloxi  IVP.  Aloxi administered as ordered and flushed behind with NS 10 ml.  Needle removed intact and pressure dsg applied to site.  Pt tolerated well.  Brian Liu presents today for injection per the provider's orders.  Velcade administration without incident; see MAR for injection details.  Patient tolerated procedure well and without incident.  No questions or complaints noted at this time.  Discharged via wheelchair in c/o family.

## 2016-11-26 NOTE — Patient Instructions (Addendum)
[place] at [facility] Discharge Instructions  RECOMMENDATIONS MADE BY THE CONSULTANT AND ANY TEST RESULTS WILL BE SENT TO YOUR REFERRING PHYSICIAN.  You were seen today by Kirby Crigler PA-C. Labs weekly during treatment. Treatment as planned. Return in 3 weeks for follow up.    Thank you for choosing [place] at [facility] to provide your oncology and hematology care.  To afford each patient quality time with our provider, please arrive at least 15 minutes before your scheduled appointment time.    If you have a lab appointment with the [facility] please come in thru the  Main Entrance and check in at the main information desk  You need to re-schedule your appointment should you arrive 10 or more minutes late.  We strive to give you quality time with our providers, and arriving late affects you and other patients whose appointments are after yours.  Also, if you no show three or more times for appointments you may be dismissed from the clinic at the providers discretion.     Again, thank you for choosing [facility].  Our hope is that these requests will decrease the amount of time that you wait before being seen by our physicians.       _____________________________________________________________  Should you have questions after your visit to [facility], please contact our office at [phone] between the hours of 8:30 a.m. and 4:30 p.m.  Voicemails left after 4:30 p.m. will not be returned until the following business day.  For prescription refill requests, have your pharmacy contact our office.       Resources For Cancer Patients and their Caregivers ? American Cancer Society: Can assist with transportation, wigs, general needs, runs Look Good Feel Better.        321-459-6905 ? Cancer Care: Provides financial assistance, online support groups, medication/co-pay assistance.  1-800-813-HOPE  (313)465-6249) ? [place] Assists [place] cancer patients and their families through emotional , educational and financial support.  205-870-5116 ? [place] DSS Where to apply for food stamps, Medicaid and utility assistance. 586-260-1890 ? RCATS: Transportation to medical appointments. (260)232-8019 ? Social Security Administration: May apply for disability if have a Stage IV cancer. (647)037-8421 (519)489-2252 ? [employer], Disability and Transit Services: Assists with nutrition, care and transit needs. [place] Support Programs: @10RELATIVEDAYS @ > Cancer Support Group  2nd Tuesday of the month 1pm-2pm, Journey Room  > Creative Journey  3rd Tuesday of the month 1130am-1pm, Journey Room  > Look Good Feel Better  1st Wednesday of the month 10am-12 noon, Journey Room (Call [place] to register [phone])

## 2016-11-26 NOTE — Patient Instructions (Signed)
[facility] Discharge Instructions for Patients Receiving Chemotherapy   Beginning January 23rd 2017 lab work for the [facility] will be done in the  Main lab at [facility] on 1st floor. If you have a lab appointment with the [place] please come in thru the  Main Entrance and check in at the main information desk   Today you received the following chemotherapy agents:  Velcade  If you develop nausea and vomiting, or diarrhea that is not controlled by your medication, call the clinic.  The clinic phone number is [phone]. Office hours are Monday-Friday 8:30am-5:00pm.  BELOW ARE SYMPTOMS THAT SHOULD BE REPORTED IMMEDIATELY:  *FEVER GREATER THAN 101.0 F  *CHILLS WITH OR WITHOUT FEVER  NAUSEA AND VOMITING THAT IS NOT CONTROLLED WITH YOUR NAUSEA MEDICATION  *UNUSUAL SHORTNESS OF BREATH  *UNUSUAL BRUISING OR BLEEDING  TENDERNESS IN MOUTH AND THROAT WITH OR WITHOUT PRESENCE OF ULCERS  *URINARY PROBLEMS  *BOWEL PROBLEMS  UNUSUAL RASH Items with * indicate a potential emergency and should be followed up as soon as possible. If you have an emergency after office hours please contact your primary care physician or go to the nearest emergency department.  Please call the clinic during office hours if you have any questions or concerns.   You may also contact the Patient Navigator at [phone] should you have any questions or need assistance in obtaining follow up care.      Resources For Cancer Patients and their Caregivers ? American Cancer Society: Can assist with transportation, wigs, general needs, runs Look Good Feel Better.        5618045166 ? Cancer Care: Provides financial assistance, online support groups, medication/co-pay assistance.  1-800-813-HOPE 682-535-1099) ? [place] Assists [place] cancer patients and their families through emotional , educational and financial support.   514-807-1588 ? [place] DSS Where to apply for food stamps, Medicaid and utility assistance. 862-672-5998 ? RCATS: Transportation to medical appointments. 249-041-3184 ? Social Security Administration: May apply for disability if have a Stage IV cancer. 208-499-2186 205-485-7055 ? [employer], Disability and Transit Services: Assists with nutrition, care and transit needs. [phone]

## 2016-11-26 NOTE — Progress Notes (Signed)
Brian Chroman, MD [place] [address]  Multiple myeloma not having achieved remission ([name]) - Plan: cyclophosphamide (CYTOXAN) 50 MG capsule  ESRD (end stage renal disease) ([place])  CURRENT THERAPY: CyBorD beginning on 11/05/2016.  Cyclophosphamide was dose reduced for hemodialysis with further dose-reduction due to thrombocytopenia.  INTERVAL HISTORY: Brian Liu 66 y.o. 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  He has been seen by Dr. Norma Liu at [facility].  He is now on CyBorD beginning on 11/05/2016 at the request of Dr. Norma Liu.    He is doing very well.  He continues to report good tolerance to chemotherapy.  He denies any nausea or vomiting.  He denies any bleeding or blood in his stool.  He has been compliant with treatment.  Due to significant thrombocytopenia on 11/19/2016, his cyclophosphamide was held.  Since holding cyclophosphamide, his platelet count has improved.  As result, we will restart cyclophosphamide next week with day 8 of treatment.  This will be dose reduced.  He continues with hemodialysis without any significant issues as well.  Review of Systems  Constitutional: Negative.  Negative for chills, fever and weight loss.  HENT: Negative.   Eyes: Negative.   Respiratory: Negative.  Negative for cough.   Cardiovascular: Negative.  Negative for chest pain.  Gastrointestinal: Negative.  Negative for blood in stool, constipation, diarrhea, melena, nausea and vomiting.  Genitourinary: Negative.   Musculoskeletal: Negative.   Skin: Negative.   Neurological: Negative.  Negative for weakness.  Endo/Heme/Allergies: Negative.   Psychiatric/Behavioral: Negative.     Past Medical History:  Diagnosis Date  . Atrial fibrillation (HCC)    Permanent, Not on Coumadin because of history of lower GI bleed  . Diastolic dysfunction    Restrictive  diastolic filling pattern echo,  February, 2013  . Dyslipidemia   .  Ejection fraction    EF 60-65%, echo, 2013  . ESRD (end stage renal disease) ([place])    Dialysis, fistula in left arm  . Hypertension   . Lower gastrointestinal bleed   . LVH (left ventricular hypertrophy)   . 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 Mitral regurgitation    Mild by echo, 2013  . Multiple myeloma ([place]) 04/30/2016  . Pancytopenia ([place])    Dr Ignacia Bayley  . Pulmonary hypertension (HCC)    Severe, PA pressure 65-70 mm of mercury, echo, 2013  . Tobacco abuse     Past Surgical History:  Procedure Laterality Date  . AV FISTULA PLACEMENT Left 1992   Left arm   . 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 Other Mother     Deceased, 66  . Stomach cancer Father     Deceased, 80  . Kidney failure Son   . Healthy Son     Social History   Social History  . Marital status: Widowed    Spouse name: N/A  . Number of children: N/A  . Years of education: N/A   Social History Main Topics  . Smoking status: Never Smoker  . Smokeless tobacco: Never Used  . Alcohol use No  . Drug use: No  . Sexual activity: Not Asked   Other Topics Concern  . None   Social History Narrative   Lives alone in a one story home.  Has 2 sons.  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     Education high school.      PHYSICAL EXAMINATION  ECOG PERFORMANCE STATUS: 1 - Symptomatic but completely ambulatory  There were no vitals filed for this visit.  Vitals - 1 value per visit 03/18/4817  SYSTOLIC 92  DIASTOLIC 59  Pulse 71  Temperature 98  Respirations 18  Weight (lb) 149.9    GENERAL:alert, no distress, well developed, comfortable, cooperative, smiling and accompanied by his son, Brian Liu. SKIN: skin color, texture, turgor are normal, no rashes or significant lesions.  He has three small (1 cm) skin tears on right dorsal aspect  of wrist, healing nicely without any evidence for infection. HEAD: Normocephalic, No masses, lesions, tenderness or abnormalities EYES: normal EARS: External ears normal OROPHARYNX:lips, buccal mucosa, and tongue normal and mucous membranes are moist  NECK: supple, trachea midline LYMPH:  no palpable lymphadenopathy BREAST:not examined LUNGS: clear to auscultation without wheezes, rales, rhonchi. HEART: regular rate & rhythm and systolic ejection murmur heard best in LSB, 2nd ICS but can be auscultated throughout the precordium. ABDOMEN:abdomen soft and normal bowel sounds BACK: Back symmetric, no curvature. EXTREMITIES:less then 2 second capillary refill, no joint deformities, effusion, or inflammation, no skin discoloration, no cyanosis  NEURO: alert & oriented x 3 with fluent speech, no focal motor/sensory deficits, gait normal   LABORATORY DATA: CBC    Component Value Date/Time   WBC 5.7 11/26/2016 1255   RBC 4.38 11/26/2016 1255   HGB 11.3 (L) 11/26/2016 1255   HCT 34.8 (L) 11/26/2016 1255   PLT 177 11/26/2016 1255   MCV 79.5 11/26/2016 1255   MCH 25.8 (L) 11/26/2016 1255   MCHC 32.5 11/26/2016 1255   RDW 15.4 11/26/2016 1255   LYMPHSABS 0.8 11/26/2016 1255   MONOABS 0.4 11/26/2016 1255   EOSABS 0.1 11/26/2016 1255   BASOSABS 0.0 11/26/2016 1255      Chemistry      Component Value Date/Time   NA 142 11/26/2016 1255   K 3.8 11/26/2016 1255   CL 102 11/26/2016 1255   CO2 27 11/26/2016 1255   BUN 42 (H) 11/26/2016 1255   CREATININE 8.33 (H) 11/26/2016 1255      Component Value Date/Time   CALCIUM 7.7 (L) 11/26/2016 1255   ALKPHOS 56 11/26/2016 1255   AST 17 11/26/2016 1255   ALT 19 11/26/2016 1255   BILITOT 0.8 11/26/2016 1255     Lab Results  Component Value Date   PROT 7.6 11/26/2016   ALBUMINELP 4.4 11/05/2016   A1GS 0.2 11/05/2016   A2GS 1.0 11/05/2016   BETS 0.8 11/05/2016   GAMS 2.0 (H) 11/05/2016   MSPIKE 0.4 (H) 11/05/2016   SPEI Comment  11/05/2016   SPECOM Comment 11/05/2016   IGGSERUM 1,889 (H) 11/05/2016   IGGSERUM QUANTITY NOT SUFFICIENT, UNABLE TO PERFORM TEST 11/05/2016   IGA 239 11/05/2016   IGA QUANTITY NOT SUFFICIENT, UNABLE TO PERFORM TEST 11/05/2016   IGMSERUM 146 11/05/2016   IGMSERUM QUANTITY NOT SUFFICIENT, UNABLE TO PERFORM TEST 11/05/2016   KPAFRELGTCHN 164.3 (H) 11/05/2016   LAMBDASER 150.3 (H) 11/05/2016   KAPLAMBRATIO 1.09 11/05/2016    PENDING LABS:   RADIOGRAPHIC STUDIES:  No results found.   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  He has been seen by Dr. Norma Liu.  On hemodialysis on M-W-F (6-10 AM).  He started CyBorD  on 11/05/2016.  Cyclophosphamide was dose reduced for hemodialysis with further dose-reduction due to thrombocytopenia.  Oncology history is updated.    I have discussed the patient's case with Dr. Norma Liu at [facility].  He reports that the patient has cardiology follow-up with them to rule out an cardio-infiltrative process.  The patient reports having follow-up with Dr. Norma Liu in May 2018.  Labs today: CBC diff, CMET, SPEP + IFE, light chain assay, B2M.  I personally reviewed and went over laboratory results with the patient.  The results are noted within this dictation.  Mild thrombocytopenia at baseline, but with recent exacerbation, likely secondary to cyclophosphamide.  Now with resolution back to baseline being off cyclophosphamide for 1-2 weeks.  Cyclophosphamide dose is now reduced to 250 mg on days 1, 8, 15 every 21 days.  I have written a new Rx for cyclophosphamide for this dose reduction.  Rx is printed for provided to Angie for approval.  He will restart cyclophosphamide on 5/1 at this reduced dose.  Labs on Day 1 each cycle: CBC diff, CMET, SPEP +IFE, light chain assay, and B2M.  Labs weekly: CBC diff, CMET while on treatment.  Problem list reviewed with patient  and edited accordingly.  Medications are reviewed with the patient and edited accordingly.  Return in 3 weeks for follow-up.  More than 50% of the time spent with the patient was utilized for counseling and coordination of care.  ESRD (end stage renal disease) ([place]) ESRD, on hemodialysis (M-W-F 6AM- 10AM). Cyclophosphamide dose reduced accordingly.    ORDERS PLACED FOR THIS ENCOUNTER: No orders of the defined types were placed in this encounter.   MEDICATIONS PRESCRIBED THIS ENCOUNTER: Meds ordered this encounter  Medications  . cyclophosphamide (CYTOXAN) 50 MG capsule    Sig: Take  tablets (250 mg total) by mouth daily. Take on days 1,8,15 of chemo 1hr before or 2hr after meals every 21d.    Dispense:  18 capsule    Refill:  5    Cycle #2.  Dose change    Order Specific Question:   Supervising Provider    Answer:   Brunetta Genera [[id number]]    THERAPY PLAN:  CyborD   All questions were answered. The patient knows to call the clinic with any problems, questions or concerns. We can certainly see the patient much sooner if necessary.  Patient and plan discussed with Dr. Twana First and she is in agreement with the aforementioned.   This note is electronically signed by: Doy Mince 11/26/2016 5:58 PM

## 2016-11-26 NOTE — Assessment & Plan Note (Signed)
ESRD, on hemodialysis (M-W-F 6AM- 10AM). Cyclophosphamide dose reduced accordingly.

## 2016-11-27 DIAGNOSIS — D472 Monoclonal gammopathy: Secondary | ICD-10-CM | POA: Diagnosis not present

## 2016-11-27 DIAGNOSIS — N2581 Secondary hyperparathyroidism of renal origin: Secondary | ICD-10-CM | POA: Diagnosis not present

## 2016-11-27 DIAGNOSIS — N186 End stage renal disease: Secondary | ICD-10-CM | POA: Diagnosis not present

## 2016-11-27 DIAGNOSIS — Z992 Dependence on renal dialysis: Secondary | ICD-10-CM | POA: Diagnosis not present

## 2016-11-27 DIAGNOSIS — D509 Iron deficiency anemia, unspecified: Secondary | ICD-10-CM | POA: Diagnosis not present

## 2016-11-27 LAB — IGG, IGA, IGM
IgA: 97 mg/dL (ref 61–437)
IgG (Immunoglobin G), Serum: 1105 mg/dL (ref 700–1600)
IgM, Serum: 88 mg/dL (ref 20–172)

## 2016-11-27 LAB — KAPPA/LAMBDA LIGHT CHAINS
Kappa free light chain: 109.3 mg/L — ABNORMAL HIGH (ref 3.3–19.4)
Kappa, lambda light chain ratio: 1.37 (ref 0.26–1.65)
Lambda free light chains: 79.5 mg/L — ABNORMAL HIGH (ref 5.7–26.3)

## 2016-11-27 LAB — PROTEIN ELECTROPHORESIS, SERUM
A/G RATIO SPE: 1.2 (ref 0.7–1.7)
Albumin ELP: 3.8 g/dL (ref 2.9–4.4)
Alpha-1-Globulin: 0.3 g/dL (ref 0.0–0.4)
Alpha-2-Globulin: 0.9 g/dL (ref 0.4–1.0)
Beta Globulin: 0.8 g/dL (ref 0.7–1.3)
Gamma Globulin: 1.1 g/dL (ref 0.4–1.8)
Globulin, Total: 3.1 g/dL (ref 2.2–3.9)
M-Spike, %: 0.2 g/dL — ABNORMAL HIGH
TOTAL PROTEIN ELP: 6.9 g/dL (ref 6.0–8.5)

## 2016-11-27 LAB — BETA 2 MICROGLOBULIN, SERUM: Beta-2 Microglobulin: 24.7 mg/L — ABNORMAL HIGH (ref 0.6–2.4)

## 2016-11-28 DIAGNOSIS — H16223 Keratoconjunctivitis sicca, not specified as Sjogren's, bilateral: Secondary | ICD-10-CM | POA: Diagnosis not present

## 2016-11-28 DIAGNOSIS — H401133 Primary open-angle glaucoma, bilateral, severe stage: Secondary | ICD-10-CM | POA: Diagnosis not present

## 2016-11-29 ENCOUNTER — Encounter (HOSPITAL_BASED_OUTPATIENT_CLINIC_OR_DEPARTMENT_OTHER): Payer: Medicare Other

## 2016-11-29 VITALS — BP 83/41 | HR 76 | Temp 97.5°F | Resp 18 | Wt 145.2 lb

## 2016-11-29 DIAGNOSIS — N186 End stage renal disease: Secondary | ICD-10-CM | POA: Diagnosis not present

## 2016-11-29 DIAGNOSIS — D509 Iron deficiency anemia, unspecified: Secondary | ICD-10-CM | POA: Diagnosis not present

## 2016-11-29 DIAGNOSIS — N2581 Secondary hyperparathyroidism of renal origin: Secondary | ICD-10-CM | POA: Diagnosis not present

## 2016-11-29 DIAGNOSIS — D472 Monoclonal gammopathy: Secondary | ICD-10-CM | POA: Diagnosis not present

## 2016-11-29 DIAGNOSIS — Z5112 Encounter for antineoplastic immunotherapy: Secondary | ICD-10-CM | POA: Diagnosis present

## 2016-11-29 DIAGNOSIS — C9000 Multiple myeloma not having achieved remission: Secondary | ICD-10-CM | POA: Diagnosis not present

## 2016-11-29 DIAGNOSIS — Z992 Dependence on renal dialysis: Secondary | ICD-10-CM | POA: Diagnosis not present

## 2016-11-29 LAB — IMMUNOFIXATION ELECTROPHORESIS
IGG (IMMUNOGLOBIN G), SERUM: 1131 mg/dL (ref 700–1600)
IGM, SERUM: 83 mg/dL (ref 20–172)
IgA: 96 mg/dL (ref 61–437)
TOTAL PROTEIN ELP: 6.6 g/dL (ref 6.0–8.5)

## 2016-11-29 MED ORDER — ONDANSETRON HCL 4 MG PO TABS
8.0000 mg | ORAL_TABLET | Freq: Once | ORAL | Status: AC
  Administered 2016-11-29: 8 mg via ORAL
  Filled 2016-11-29: qty 2

## 2016-11-29 MED ORDER — BORTEZOMIB CHEMO SQ INJECTION 3.5 MG (2.5MG/ML)
1.3000 mg/m2 | Freq: Once | INTRAMUSCULAR | Status: AC
  Administered 2016-11-29: 2.5 mg via SUBCUTANEOUS
  Filled 2016-11-29: qty 2.5

## 2016-11-29 MED ORDER — ONDANSETRON HCL 4 MG PO TABS
ORAL_TABLET | ORAL | Status: AC
Start: 2016-11-29 — End: 2016-11-29
  Filled 2016-11-29: qty 1

## 2016-11-29 NOTE — Patient Instructions (Signed)
[facility] Discharge Instructions for Patients Receiving Chemotherapy   Beginning January 23rd 2017 lab work for the [facility] will be done in the  Main lab at [facility] on 1st floor. If you have a lab appointment with the [facility] please come in thru the  Main Entrance and check in at the main information desk   Today you received the following chemotherapy agents velcade injection.  If you develop nausea and vomiting, or diarrhea that is not controlled by your medication, call the clinic.  The clinic phone number is [phone]. Office hours are Monday-Friday 8:30am-5:00pm.  BELOW ARE SYMPTOMS THAT SHOULD BE REPORTED IMMEDIATELY:  *FEVER GREATER THAN 101.0 F  *CHILLS WITH OR WITHOUT FEVER  NAUSEA AND VOMITING THAT IS NOT CONTROLLED WITH YOUR NAUSEA MEDICATION  *UNUSUAL SHORTNESS OF BREATH  *UNUSUAL BRUISING OR BLEEDING  TENDERNESS IN MOUTH AND THROAT WITH OR WITHOUT PRESENCE OF ULCERS  *URINARY PROBLEMS  *BOWEL PROBLEMS  UNUSUAL RASH Items with * indicate a potential emergency and should be followed up as soon as possible. If you have an emergency after office hours please contact your primary care physician or go to the nearest emergency department.  Please call the clinic during office hours if you have any questions or concerns.   You may also contact the Patient Navigator at [phone] should you have any questions or need assistance in obtaining follow up care.      Resources For Cancer Patients and their Caregivers ? American Cancer Society: Can assist with transportation, wigs, general needs, runs Look Good Feel Better.        903-525-0219 ? Cancer Care: Provides financial assistance, online support groups, medication/co-pay assistance.  1-800-813-HOPE 520 822 8127) ? [place] Assists Coley Kulikowski [place] cancer patients and their families through emotional , educational and financial support.   [phone] ? [place] DSS Where to apply for food stamps, Medicaid and utility assistance. 712-850-7263 ? [facility]: Transportation to medical appointments. 432-148-2365 ? Social Security Administration: May apply for disability if have a Stage IV cancer. 972-774-2403 650-102-9674 ? [employer], Disability and Transit Services: Assists with nutrition, care and transit needs. [phone]

## 2016-11-29 NOTE — Progress Notes (Signed)
Patient reports receiving Brian Liu bottle cyclophosphamide RX. Patient reports he is taking correctly and was able to verbalize taking 5 pills instead of 6 as on bottle. Denies any complaints, side effects/issues with velcade injection or po medications.  [name] presents today for injection per MD orders. Velcade administered SQ in right Abdomen. Administration without incident. Patient tolerated well.

## 2016-12-02 ENCOUNTER — Other Ambulatory Visit: Payer: Self-pay

## 2016-12-02 ENCOUNTER — Encounter (HOSPITAL_COMMUNITY): Payer: Self-pay

## 2016-12-02 ENCOUNTER — Inpatient Hospital Stay (HOSPITAL_COMMUNITY)
Admission: EM | Admit: 2016-12-02 | Discharge: 2016-12-06 | DRG: 438 | Disposition: A | Discharge status: Home / Self Care | Payer: Medicare Other | Attending: Internal Medicine | Admitting: Internal Medicine

## 2016-12-02 ENCOUNTER — Emergency Department (HOSPITAL_COMMUNITY): Payer: Medicare Other

## 2016-12-02 DIAGNOSIS — K529 Noninfective gastroenteritis and colitis, unspecified: Secondary | ICD-10-CM | POA: Diagnosis present

## 2016-12-02 DIAGNOSIS — K8020 Calculus of gallbladder without cholecystitis without obstruction: Secondary | ICD-10-CM | POA: Diagnosis not present

## 2016-12-02 DIAGNOSIS — D638 Anemia in other chronic diseases classified elsewhere: Secondary | ICD-10-CM | POA: Diagnosis present

## 2016-12-02 DIAGNOSIS — E859 Amyloidosis, unspecified: Secondary | ICD-10-CM | POA: Diagnosis present

## 2016-12-02 DIAGNOSIS — I120 Hypertensive chronic kidney disease with stage 5 chronic kidney disease or end stage renal disease: Secondary | ICD-10-CM | POA: Diagnosis not present

## 2016-12-02 DIAGNOSIS — D61810 Antineoplastic chemotherapy induced pancytopenia: Secondary | ICD-10-CM | POA: Diagnosis present

## 2016-12-02 DIAGNOSIS — D631 Anemia in chronic kidney disease: Secondary | ICD-10-CM | POA: Diagnosis present

## 2016-12-02 DIAGNOSIS — R109 Unspecified abdominal pain: Secondary | ICD-10-CM

## 2016-12-02 DIAGNOSIS — D649 Anemia, unspecified: Secondary | ICD-10-CM

## 2016-12-02 DIAGNOSIS — E8889 Other specified metabolic disorders: Secondary | ICD-10-CM | POA: Diagnosis present

## 2016-12-02 DIAGNOSIS — Z992 Dependence on renal dialysis: Secondary | ICD-10-CM | POA: Diagnosis not present

## 2016-12-02 DIAGNOSIS — K859 Acute pancreatitis without necrosis or infection, unspecified: Secondary | ICD-10-CM | POA: Diagnosis not present

## 2016-12-02 DIAGNOSIS — I9589 Other hypotension: Secondary | ICD-10-CM | POA: Diagnosis present

## 2016-12-02 DIAGNOSIS — C9000 Multiple myeloma not having achieved remission: Secondary | ICD-10-CM | POA: Diagnosis not present

## 2016-12-02 DIAGNOSIS — N2581 Secondary hyperparathyroidism of renal origin: Secondary | ICD-10-CM | POA: Diagnosis not present

## 2016-12-02 DIAGNOSIS — T8089XA Other complications following infusion, transfusion and therapeutic injection, initial encounter: Secondary | ICD-10-CM | POA: Diagnosis not present

## 2016-12-02 DIAGNOSIS — D472 Monoclonal gammopathy: Secondary | ICD-10-CM | POA: Diagnosis not present

## 2016-12-02 DIAGNOSIS — Z681 Body mass index (BMI) 19 or less, adult: Secondary | ICD-10-CM

## 2016-12-02 DIAGNOSIS — Z79899 Other long term (current) drug therapy: Secondary | ICD-10-CM

## 2016-12-02 DIAGNOSIS — N186 End stage renal disease: Secondary | ICD-10-CM | POA: Diagnosis present

## 2016-12-02 DIAGNOSIS — I482 Chronic atrial fibrillation: Secondary | ICD-10-CM | POA: Diagnosis present

## 2016-12-02 DIAGNOSIS — D509 Iron deficiency anemia, unspecified: Secondary | ICD-10-CM | POA: Diagnosis not present

## 2016-12-02 DIAGNOSIS — T8612 Kidney transplant failure: Secondary | ICD-10-CM | POA: Diagnosis present

## 2016-12-02 DIAGNOSIS — E785 Hyperlipidemia, unspecified: Secondary | ICD-10-CM | POA: Diagnosis present

## 2016-12-02 DIAGNOSIS — I340 Nonrheumatic mitral (valve) insufficiency: Secondary | ICD-10-CM | POA: Diagnosis present

## 2016-12-02 DIAGNOSIS — D6959 Other secondary thrombocytopenia: Secondary | ICD-10-CM | POA: Diagnosis present

## 2016-12-02 DIAGNOSIS — H409 Unspecified glaucoma: Secondary | ICD-10-CM | POA: Diagnosis present

## 2016-12-02 DIAGNOSIS — I4891 Unspecified atrial fibrillation: Secondary | ICD-10-CM | POA: Diagnosis present

## 2016-12-02 DIAGNOSIS — E876 Hypokalemia: Secondary | ICD-10-CM

## 2016-12-02 DIAGNOSIS — N281 Cyst of kidney, acquired: Secondary | ICD-10-CM | POA: Diagnosis not present

## 2016-12-02 DIAGNOSIS — D61818 Other pancytopenia: Secondary | ICD-10-CM | POA: Diagnosis present

## 2016-12-02 DIAGNOSIS — T451X5A Adverse effect of antineoplastic and immunosuppressive drugs, initial encounter: Secondary | ICD-10-CM | POA: Diagnosis present

## 2016-12-02 DIAGNOSIS — Y830 Surgical operation with transplant of whole organ as the cause of abnormal reaction of the patient, or of later complication, without mention of misadventure at the time of the procedure: Secondary | ICD-10-CM | POA: Diagnosis present

## 2016-12-02 DIAGNOSIS — B370 Candidal stomatitis: Secondary | ICD-10-CM | POA: Diagnosis present

## 2016-12-02 DIAGNOSIS — R509 Fever, unspecified: Secondary | ICD-10-CM | POA: Diagnosis not present

## 2016-12-02 DIAGNOSIS — I272 Pulmonary hypertension, unspecified: Secondary | ICD-10-CM | POA: Diagnosis present

## 2016-12-02 DIAGNOSIS — I10 Essential (primary) hypertension: Secondary | ICD-10-CM | POA: Diagnosis present

## 2016-12-02 LAB — CBC WITH DIFFERENTIAL/PLATELET
Basophils Absolute: 0 10*3/uL (ref 0.0–0.1)
Basophils Relative: 0 %
Eosinophils Absolute: 0.1 10*3/uL (ref 0.0–0.7)
Eosinophils Relative: 2 %
HEMATOCRIT: 37 % — AB (ref 39.0–52.0)
HEMOGLOBIN: 12.2 g/dL — AB (ref 13.0–17.0)
LYMPHS ABS: 0.6 10*3/uL — AB (ref 0.7–4.0)
Lymphocytes Relative: 12 %
MCH: 26 pg (ref 26.0–34.0)
MCHC: 33 g/dL (ref 30.0–36.0)
MCV: 78.9 fL (ref 78.0–100.0)
MONOS PCT: 7 %
Monocytes Absolute: 0.4 10*3/uL (ref 0.1–1.0)
NEUTROS ABS: 4 10*3/uL (ref 1.7–7.7)
NEUTROS PCT: 79 %
Platelets: 74 10*3/uL — ABNORMAL LOW (ref 150–400)
RBC: 4.69 MIL/uL (ref 4.22–5.81)
RDW: 15 % (ref 11.5–15.5)
WBC: 5.1 10*3/uL (ref 4.0–10.5)

## 2016-12-02 LAB — COMPREHENSIVE METABOLIC PANEL
ALBUMIN: 3.9 g/dL (ref 3.5–5.0)
ALK PHOS: 57 U/L (ref 38–126)
ALT: 21 U/L (ref 17–63)
AST: 28 U/L (ref 15–41)
Anion gap: 16 — ABNORMAL HIGH (ref 5–15)
BILIRUBIN TOTAL: 0.7 mg/dL (ref 0.3–1.2)
BUN: 24 mg/dL — AB (ref 6–20)
CALCIUM: 8.4 mg/dL — AB (ref 8.9–10.3)
CO2: 26 mmol/L (ref 22–32)
Chloride: 97 mmol/L — ABNORMAL LOW (ref 101–111)
Creatinine, Ser: 6.1 mg/dL — ABNORMAL HIGH (ref 0.61–1.24)
GFR calc Af Amer: 10 mL/min — ABNORMAL LOW (ref >60)
GFR calc non Af Amer: 9 mL/min — ABNORMAL LOW (ref >60)
GLUCOSE: 76 mg/dL (ref 65–99)
Potassium: 3.4 mmol/L — ABNORMAL LOW (ref 3.5–5.1)
SODIUM: 139 mmol/L (ref 135–145)
TOTAL PROTEIN: 7.8 g/dL (ref 6.5–8.1)

## 2016-12-02 LAB — LIPASE, BLOOD: Lipase: 66 U/L — ABNORMAL HIGH (ref 11–51)

## 2016-12-02 LAB — PROTIME-INR
INR: 1.08
Prothrombin Time: 14 seconds (ref 11.4–15.2)

## 2016-12-02 LAB — I-STAT CG4 LACTIC ACID, ED: Lactic Acid, Venous: 1.58 mmol/L (ref 0.5–1.9)

## 2016-12-02 MED ORDER — LEVOTHYROXINE SODIUM 25 MCG PO TABS
137.0000 ug | ORAL_TABLET | Freq: Every day | ORAL | Status: DC
  Administered 2016-12-04 – 2016-12-06 (×3): 137 ug via ORAL
  Filled 2016-12-02 (×4): qty 1

## 2016-12-02 MED ORDER — FENTANYL CITRATE (PF) 100 MCG/2ML IJ SOLN
50.0000 ug | Freq: Once | INTRAMUSCULAR | Status: AC
  Administered 2016-12-02: 50 ug via INTRAVENOUS
  Filled 2016-12-02: qty 2

## 2016-12-02 MED ORDER — ACYCLOVIR 200 MG PO CAPS: 400.0000 mg | ORAL_CAPSULE | Freq: Two times a day (BID) | ORAL | Status: DC

## 2016-12-02 MED ORDER — ACETAMINOPHEN 325 MG PO TABS
650.0000 mg | ORAL_TABLET | Freq: Four times a day (QID) | ORAL | Status: DC | PRN
  Administered 2016-12-03: 650 mg via ORAL
  Filled 2016-12-02: qty 2

## 2016-12-02 MED ORDER — LANTHANUM CARBONATE 500 MG PO CHEW: 1000.0000 mg | CHEWABLE_TABLET | Freq: Three times a day (TID) | ORAL | Status: DC

## 2016-12-02 MED ORDER — DIFLUPREDNATE 0.05 % OP EMUL
1.0000 [drp] | Freq: Every day | OPHTHALMIC | Status: DC
  Administered 2016-12-05 – 2016-12-06 (×9): 1 [drp] via OPHTHALMIC
  Filled 2016-12-02: qty 5

## 2016-12-02 MED ORDER — SACCHAROMYCES BOULARDII 250 MG PO CAPS
250.0000 mg | ORAL_CAPSULE | Freq: Two times a day (BID) | ORAL | Status: DC
  Administered 2016-12-02 – 2016-12-06 (×7): 250 mg via ORAL
  Filled 2016-12-02 (×9): qty 1

## 2016-12-02 MED ORDER — GABAPENTIN 300 MG PO CAPS
300.0000 mg | ORAL_CAPSULE | Freq: Three times a day (TID) | ORAL | Status: DC
  Administered 2016-12-02: 300 mg via ORAL
  Filled 2016-12-02: qty 1

## 2016-12-02 MED ORDER — LATANOPROST 0.005 % OP SOLN
1.0000 [drp] | Freq: Every day | OPHTHALMIC | Status: DC
  Administered 2016-12-03 – 2016-12-05 (×2): 1 [drp] via OPHTHALMIC
  Filled 2016-12-02: qty 2.5

## 2016-12-02 MED ORDER — LANTHANUM CARBONATE 500 MG PO CHEW
500.0000 mg | CHEWABLE_TABLET | ORAL | Status: DC | PRN
  Filled 2016-12-02: qty 1

## 2016-12-02 MED ORDER — BRINZOLAMIDE 1 % OP SUSP
1.0000 [drp] | Freq: Two times a day (BID) | OPHTHALMIC | Status: DC
  Administered 2016-12-03 – 2016-12-06 (×7): 1 [drp] via OPHTHALMIC
  Filled 2016-12-02: qty 10

## 2016-12-02 MED ORDER — KETOROLAC TROMETHAMINE 0.5 % OP SOLN
1.0000 [drp] | Freq: Every day | OPHTHALMIC | Status: DC
  Administered 2016-12-03 – 2016-12-06 (×4): 1 [drp] via OPHTHALMIC
  Filled 2016-12-02: qty 3

## 2016-12-02 MED ORDER — CINACALCET HCL 30 MG PO TABS
30.0000 mg | ORAL_TABLET | Freq: Every day | ORAL | Status: DC
  Administered 2016-12-05 – 2016-12-06 (×2): 30 mg via ORAL
  Filled 2016-12-02 (×4): qty 1

## 2016-12-02 MED ORDER — PANTOPRAZOLE SODIUM 40 MG IV SOLR
40.0000 mg | Freq: Two times a day (BID) | INTRAVENOUS | Status: DC
  Administered 2016-12-02 – 2016-12-06 (×8): 40 mg via INTRAVENOUS
  Filled 2016-12-02 (×8): qty 40

## 2016-12-02 MED ORDER — ONDANSETRON HCL 4 MG/2ML IJ SOLN
4.0000 mg | Freq: Once | INTRAMUSCULAR | Status: AC
  Administered 2016-12-02: 4 mg via INTRAVENOUS
  Filled 2016-12-02: qty 2

## 2016-12-02 MED ORDER — NON FORMULARY: 1.0000 [drp] | Freq: Two times a day (BID) | Status: DC

## 2016-12-02 MED ORDER — CYCLOSPORINE 0.05 % OP EMUL
1.0000 [drp] | Freq: Two times a day (BID) | OPHTHALMIC | Status: DC
Start: 2016-12-02 — End: 2016-12-06
  Administered 2016-12-03 – 2016-12-06 (×6): 1 [drp] via OPHTHALMIC
  Filled 2016-12-02 (×9): qty 1

## 2016-12-02 MED ORDER — LABETALOL HCL 200 MG PO TABS: 100.0000 mg | ORAL_TABLET | Freq: Two times a day (BID) | ORAL | Status: DC

## 2016-12-02 MED ORDER — PROCHLORPERAZINE MALEATE 5 MG PO TABS: 10.0000 mg | ORAL_TABLET | Freq: Four times a day (QID) | ORAL | Status: DC | PRN

## 2016-12-02 MED ORDER — ACETAMINOPHEN 650 MG RE SUPP: 650.0000 mg | Freq: Four times a day (QID) | RECTAL | Status: DC | PRN

## 2016-12-02 MED ORDER — SODIUM CHLORIDE 0.9 % IV SOLN
INTRAVENOUS | Status: AC
  Administered 2016-12-02: 18:00:00 via INTRAVENOUS

## 2016-12-02 MED ORDER — SIMVASTATIN 20 MG PO TABS
20.0000 mg | ORAL_TABLET | Freq: Every day | ORAL | Status: DC
  Administered 2016-12-02 – 2016-12-05 (×3): 20 mg via ORAL
  Filled 2016-12-02 (×3): qty 1

## 2016-12-02 MED ORDER — ADULT MULTIVITAMIN W/MINERALS CH
1.0000 | ORAL_TABLET | Freq: Every day | ORAL | Status: DC
  Administered 2016-12-04 – 2016-12-06 (×3): 1 via ORAL
  Filled 2016-12-02 (×4): qty 1

## 2016-12-02 MED ORDER — LANTHANUM CARBONATE 500 MG PO CHEW
1000.0000 mg | CHEWABLE_TABLET | Freq: Three times a day (TID) | ORAL | Status: DC
  Administered 2016-12-04 – 2016-12-06 (×7): 1000 mg via ORAL
  Filled 2016-12-02 (×21): qty 2

## 2016-12-02 MED ORDER — FOLIC ACID 1 MG PO TABS
1.0000 mg | ORAL_TABLET | Freq: Every day | ORAL | Status: DC
  Administered 2016-12-02 – 2016-12-06 (×4): 1 mg via ORAL
  Filled 2016-12-02 (×5): qty 1

## 2016-12-02 MED ORDER — BRIMONIDINE TARTRATE 0.2 % OP SOLN
1.0000 [drp] | Freq: Two times a day (BID) | OPHTHALMIC | Status: DC
  Administered 2016-12-03 – 2016-12-06 (×7): 1 [drp] via OPHTHALMIC
  Filled 2016-12-02: qty 5

## 2016-12-02 MED ORDER — HEPARIN SODIUM (PORCINE) 5000 UNIT/ML IJ SOLN
5000.0000 [IU] | Freq: Three times a day (TID) | INTRAMUSCULAR | Status: DC
  Administered 2016-12-02 – 2016-12-03 (×3): 5000 [IU] via SUBCUTANEOUS
  Filled 2016-12-02 (×3): qty 1

## 2016-12-02 MED ORDER — HYDROMORPHONE HCL 1 MG/ML IJ SOLN
1.0000 mg | INTRAMUSCULAR | Status: DC | PRN
  Administered 2016-12-02 – 2016-12-04 (×3): 1 mg via INTRAVENOUS
  Filled 2016-12-02 (×3): qty 1

## 2016-12-02 NOTE — H&P (Addendum)
TRH H&P   Patient Demographics:    Brian Liu, is a 66 y.o. male  MRN: [id number]   DOB - 1950-11-04  Admit Date - 12/02/2016  Outpatient Primary MD for the patient is [name], MD  Referring MD/NP/PA:  Dimas Chyle  Outpatient Specialists: Hinda Lenis (nephrology), Bronson Ing (cardiology)  Patient coming from: dialysis  Chief Complaint  Patient presents with  . Abdominal Pain      HPI:    Brian Liu  is a 66 y.o. male, w Hypertension, Pafib (chadsvasc2=3), ESRD on HD (M, W, F), apparently c/o n/v, starting on Sunday, and it made his stomach sore inside.  When he got to dialysis today his stomach was sore and his back was hurting. Pt points to the epigastric area.  Dull pain.  + heartburn.  Denies fever, chills, hematemesis, diarrhea, brbpr, black stool.  Pt denies etoh use, or family history.   Pt was sent to ED for evaluation.    In ED,  Pt had CT abd/pelvis, => mild wall thickening involving the left upper quadrant small bowel loops, nonspecific in nature may reflect generalized anasarca secondary to renal failure, can not r/o enteritis. 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  RUQ us=> cholelithiasis, without evidence of acute cholecystitis. Lipase was elevated at 66.  Pt admitted for epigastric pain, / pancreatitis.     Review of systems:    In addition to the HPI above,  No Fever-chills, No Headache, No changes with Vision or hearing, No problems swallowing food or Liquids, No Chest pain, Cough or Shortness of Breath,  No Blood in stool or Urine, No dysuria, No new skin rashes or bruises, No new joints pains-aches,  No new weakness, tingling, numbness in any extremity, No recent weight gain or loss, No polyuria, polydypsia or polyphagia, No significant Mental Stressors.  A full 10 point Review of Systems was done, except as stated above,  all other Review of Systems were negative.   With Past History of the following :    Past Medical History:  Diagnosis Date  . Atrial fibrillation (HCC)    Permanent, Not on Coumadin because of history of lower GI bleed  . Diastolic dysfunction    Restrictive  diastolic filling pattern echo,  February, 2013  . Dyslipidemia   . Ejection fraction    EF 60-65%, echo, 2013  . ESRD (end stage renal disease) ([place])    Dialysis, fistula in left arm  . Hypertension   . Lower gastrointestinal bleed   . LVH (left ventricular hypertrophy)   . 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 Mitral regurgitation    Mild by echo, 2013  . Multiple myeloma ([place]) 04/30/2016  . Pancytopenia ([place])    Dr Ignacia Bayley  . Pulmonary hypertension (HCC)    Severe, PA pressure 65-70 mm of mercury, echo, 2013  . Tobacco abuse  Past Surgical History:  Procedure Laterality Date  . AV FISTULA PLACEMENT Left 1992   Left arm   . 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 Smoking status: Never Smoker  . Smokeless tobacco: Never Used  . Alcohol use No     Lives - at home Mobility -  Walk by self w cane   Family History :     Family History  Problem Relation Age of Onset  . Other Mother     Deceased, 105  . Stomach cancer Father     Deceased, 40  . Kidney failure Son   . Healthy Son       Home Medications:   Prior to Admission medications   Medication Sig Start Date End Date Taking? Authorizing Provider  acyclovir (ZOVIRAX) 400 MG tablet Take 1 tablet (400 mg total) by mouth 2 (two) times daily. 10/25/16  Yes Manon Hilding Kefalas, PA-C  Bromfenac Sodium (PROLENSA) 0.07 % SOLN Apply 1 drop to eye daily.   Yes Historical Provider, MD  cycloSPORINE (RESTASIS) 0.05 % ophthalmic emulsion Place 1 drop into both eyes 2 (two) times daily.   Yes Historical Provider, MD    dexamethasone (DECADRON) 4 MG tablet Take 10 tablets (40 mg) on days 1, 8, and 15 of chemo. Repeat every 21 days. Take 2 tablets (101m) daily for 2 days starting after days 1 and 8 of chemotherapy. 10/25/16  Yes TManon HildingKefalas, PA-C  Difluprednate (DUREZOL) 0.05 % EMUL Place 1 drop into the right eye 6 (six) times daily.   Yes Historical Provider, MD  latanoprost (XALATAN) 0.005 % ophthalmic solution Place 1 drop into both eyes at bedtime. 07/13/14  Yes Historical Provider, MD  midodrine (PROAMATINE) 10 MG tablet Take 10 mg by mouth 2 (two) times daily.  10/26/16  Yes Historical Provider, MD  Multiple Vitamin (DAILY VITE) TABS Take 1 tablet by mouth daily.    Yes Historical Provider, MD  ondansetron (ZOFRAN) 8 MG tablet Take 1 tablet (8 mg total) by mouth every 8 (eight) hours as needed for nausea or vomiting. 10/29/16  Yes TBaird Cancer PA-C  prochlorperazine (COMPAZINE) 10 MG tablet Take 1 tablet (10 mg total) by mouth every 6 (six) hours as needed for nausea or vomiting. 10/29/16  Yes Thomas S Kefalas, PA-C  SIMBRINZA 1-0.2 % SUSP INSTILL 1 DROP IN INTO BOTH EYES TWICE A DAY 01/03/15  Yes Historical Provider, MD  Bortezomib (VELCADE IJ) Inject as directed. Days 1, 4, 8, 11 every 21 days.    Historical Provider, MD  cinacalcet (SENSIPAR) 30 MG tablet Take 30 mg by mouth.    Historical Provider, MD  cyclophosphamide (CYTOXAN) 50 MG capsule Take  tablets (250 mg total) by mouth daily. Take on days 1,8,15 of chemo 1hr before or 2hr after meals every 21d. 11/26/16   TBaird Cancer PA-C  folic acid (FOLVITE) 1 MG tablet Take 1 mg by mouth daily.    Historical Provider, MD  gabapentin (NEURONTIN) 300 MG capsule Take 1 capsule (300 mg total) by mouth 3 (three) times daily. 01/19/15   Donika K Patel, DO  labetalol (NORMODYNE) 100 MG tablet TAKE 1 BY MOUTH TWO TIMES  DAILY 07/04/16   SHerminio Commons MD  lanthanum (FOSRENOL) 500 MG chewable tablet Chew 1,000 mg by mouth 3 (three) times daily with meals.  And 1 with snacks    Historical Provider, MD  levothyroxine (SYNTHROID, LEVOTHROID) 137 MCG tablet Take 137 mcg by mouth daily before breakfast.    Historical Provider, MD  minoxidil (LONITEN) 2.5 MG tablet  05/13/16   Historical Provider, MD  simvastatin (ZOCOR) 20 MG tablet Take 20 mg by mouth daily with supper.      Historical Provider, MD     Allergies:    No Known Allergies   Physical Exam:   Vitals  Blood pressure (!) 155/111, pulse 91, temperature 97.7 F (36.5 C), temperature source Oral, resp. rate 16, height 6' (1.829 m), weight 63.7 kg (140 lb 8 oz), SpO2 100 %.   1. General  lying in bed in NAD,    2. Normal affect and insight, Not Suicidal or Homicidal, Awake Alert, Oriented X 3.  3. No F.N deficits, ALL C.Nerves Intact, Strength 5/5 all 4 extremities, Sensation intact all 4 extremities, Plantars down going.  4. Ears and Eyes appear Normal, Conjunctivae clear, PERRLA. Moist Oral Mucosa.  5. Supple Neck, No JVD, No cervical lymphadenopathy appriciated, No Carotid Bruits.  6. Symmetrical Chest wall movement, Good air movement bilaterally, CTAB.  7. RRR, No Gallops, Rubs or Murmurs, No Parasternal Heave.  8. Positive Bowel Sounds, Abdomen Soft, No tenderness, No organomegaly appriciated,No rebound -guarding or rigidity.  9.  No Cyanosis, Normal Skin Turgor, No Skin Rash or Bruise.  10. Good muscle tone,  joints appear normal , no effusions, Normal ROM.  11. No Palpable Lymph Nodes in Neck or Axillae    Data Review:    CBC  Recent Labs Lab 11/26/16 1255 12/02/16 1127  WBC 5.7 5.1  HGB 11.3* 12.2*  HCT 34.8* 37.0*  PLT 177 74*  MCV 79.5 78.9  MCH 25.8* 26.0  MCHC 32.5 33.0  RDW 15.4 15.0  LYMPHSABS 0.8 0.6*  MONOABS 0.4 0.4  EOSABS 0.1 0.1  BASOSABS 0.0 0.0   ------------------------------------------------------------------------------------------------------------------  Chemistries   Recent Labs Lab 11/26/16 1255 12/02/16 1127  NA 142  139  K 3.8 3.4*  CL 102 97*  CO2 27 26  GLUCOSE 107* 76  BUN 42* 24*  CREATININE 8.33* 6.10*  CALCIUM 7.7* 8.4*  AST 17 28  ALT 19 21  ALKPHOS 56 57  BILITOT 0.8 0.7   ------------------------------------------------------------------------------------------------------------------ estimated creatinine clearance is 11 mL/min (A) (by C-G formula based on SCr of 6.1 mg/dL (H)). ------------------------------------------------------------------------------------------------------------------ No results for input(s): TSH, T4TOTAL, T3FREE, THYROIDAB in the last 72 hours.  Invalid input(s): FREET3  Coagulation profile  Recent Labs Lab 12/02/16 1127  INR 1.08   ------------------------------------------------------------------------------------------------------------------- No results for input(s): DDIMER in the last 72 hours. -------------------------------------------------------------------------------------------------------------------  Cardiac Enzymes No results for input(s): CKMB, TROPONINI, MYOGLOBIN in the last 168 hours.  Invalid input(s): CK ------------------------------------------------------------------------------------------------------------------ No results found for: BNP   ---------------------------------------------------------------------------------------------------------------  Urinalysis No results found for: COLORURINE, APPEARANCEUR, LABSPEC, [place], GLUCOSEU, HGBUR, BILIRUBINUR, KETONESUR, PROTEINUR, UROBILINOGEN, NITRITE, LEUKOCYTESUR  ----------------------------------------------------------------------------------------------------------------   Imaging Results:    US Abdomen Complete  Result Date: 12/02/2016 CLINICAL DATA:  66 year old male with history of right upper quadrant abdominal pain for 1 day. EXAM: ABDOMEN ULTRASOUND COMPLETE COMPARISON:  No priors.  CT of abdomen and pelvis 12/02/2016. FINDINGS: Gallbladder: Small echogenic  foci without significant posterior acoustic shadowing, compatible with the small gallstones noted on recent CT examination. Gallbladder is only moderately distended. Gallbladder wall thickness is normal (2.1 mm). No pericholecystic fluid. Per report from the sonographer, the patient did not exhibit a sonographic Murphy's sign  on examination. Common bile duct: Diameter: 5.5 mm Liver: Heterogeneous echotexture throughout the hepatic parenchyma, without discrete hepatic lesions. No intra hepatic biliary ductal dilatation. Normal hepatopetal flow in the portal vein. IVC: No abnormality visualized. Pancreas: Visualized portion unremarkable. Spleen: Normal in size. 2.1 x 2.3 x 1.9 cm anechoic to hypoechoic lesion with a few internal septations, increase through transmission and no internal blood flow, indeterminate, but favored to represent a minimally complex cyst. Notably, a lesion of this size has been present on prior CT examinations dating back to 2013, presumably benign. Right Kidney: Length: 11.3 cm. Diffuse cortical thinning and diffusely increased cortical echogenicity, indicative of underlying medical renal disease. No hydronephrosis visualized. Multiple anechoic lesions with increased through transmission, compatible with simple cysts, measuring up to 2.8 x 2.8 x 2.9 cm in the upper pole. Innumerable other smaller lesions range from anechoic to hypoechoic, incompletely characterized, but favored to represent small cysts of varying degrees of complexity. Left Kidney: Length: 7.3 cm. Diffuse cortical thinning and diffusely increased cortical echogenicity, indicative of underlying medical renal disease. No hydronephrosis visualized. Multiple anechoic lesions with increased through transmission, compatible with simple cysts, measuring up to 1.2 x 1.2 x 1.2 cm in the interpolar region. Innumerable other smaller lesions range from anechoic to hypoechoic, incompletely characterized, but favored to represent small  cysts of varying degrees of complexity. Abdominal aorta: No aneurysm visualized. Other findings: None. IMPRESSION: 1. Severe cortical thinning in the kidneys bilaterally, with diffuse increased echogenicity throughout the renal parenchyma, suggestive of underlying medical renal disease. Associated with this, there are innumerable cystic lesions in the kidneys bilaterally, likely related to underlying renal disease. 2. Cholelithiasis without evidence of acute cholecystitis at this time. 3. Indeterminate lesion in the spleen which appears stable compared to numerous prior CT examinations going back several years, presumably a benign mildly complex cyst. Electronically Signed   By: Vinnie Langton M.D.   On: 12/02/2016 14:32   Ct Renal Stone Study  Result Date: 12/02/2016 CLINICAL DATA:  Abdominal pain and vomiting. EXAM: CT ABDOMEN AND PELVIS WITHOUT CONTRAST TECHNIQUE: Multidetector CT imaging of the abdomen and pelvis was performed following the standard protocol without IV contrast. COMPARISON:  10/01/2011 FINDINGS: Lower chest: No acute abnormality.  Cardiac enlargement noted. Hepatobiliary: No focal liver abnormality. Small stones are identified within the dependent portion of the gallbladder. Pancreas: Unremarkable. No pancreatic ductal dilatation or surrounding inflammatory changes. Spleen: No change in 2.5 cm low-attenuation structure within the spleen, image number 9 of series 2. Adrenals/Urinary Tract: The adrenal glands are normal. End-stage bilateral kidneys identified containing multiple cysts. Bilateral renal calculi are identified on the left these measure up to 5 mm. On the right these measure up to 5 mm. No hydronephrosis or hydroureter identified. No ureteral lithiasis. Calcified mass within the left lower quadrant of the abdomen compatible with chronic infarcted renal graft. Urinary bladder is unremarkable for degree of distention. Stomach/Bowel: The stomach appears normal. Left upper quadrant  small bowel loops exhibit mild wall thickening. No abnormal dilatation of the small or large bowel loops. No evidence for obstruction. Vascular/Lymphatic: Aortic atherosclerosis. No aneurysm. No upper abdominal adenopathy. Reproductive: Prostate is unremarkable. Other: No abdominal wall hernia or abnormality. No abdominopelvic ascites. Musculoskeletal: Changes of renal osteodystrophy identified. Multiple Schmorl node deformities are noted within the lumbar spine. IMPRESSION: 1. There is mild wall thickening involving left upper quadrant small bowel loops. Nonspecific in may reflect generalized anasarca secondary to renal failure. Cannot rule out enteritis. 2. Bilateral end-stage cystic kidneys. Renal  calculi are noted bilaterally without evidence for hydronephrosis. 3. 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 4.  Aortic Atherosclerosis (ICD10-I70.0). 5. Gallstones .er Electronically Signed   By: Kerby Moors M.D.   On: 12/02/2016 13:05       Assessment & Plan:    Active Problems:   Hypertension   ESRD (end stage renal disease) (HCC)   Pancreatitis   Hypokalemia   Anemia    Epigastric pain ? Viral gastroenteritis, ddx Gastritis, PUD   , pancreatitis No CT evidence for pancreatitis, but lipase elevated mildly  ? Due to ESRD Start protonix 23m iv bid Can convert to PO in am.  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  Glaucoma Cont home eye drops  DVT Prophylaxis Heparin - SCDs  AM Labs Ordered, also please review Full Orders  Family Communication:  Admission, patients condition and plan of care including tests being ordered have been discussed with the patient  who indicate understanding and agree with the plan and Code Status.  Code Status FULL CODE  Likely DC to  home  Condition GUARDED   Consults called: none  Admission status: observation  Time spent in minutes : 45 minutes   JJani GravelM.D on 12/02/2016 at 6:52 PM  Between 7am to 7pm - Pager - [phone] 7pm go to [email or web address] - password [email or web address] - Office  [phone]

## 2016-12-02 NOTE — ED Notes (Signed)
Pt does not create urine.

## 2016-12-02 NOTE — ED Notes (Signed)
Report given to 300 at this time.  

## 2016-12-02 NOTE — ED Triage Notes (Addendum)
Pt reports abd pain and vomiting since last night.  Reports pain got worse at dialysis this morning so he left 69mn early.  Denies diarrhea.  LBM was yesterday.  Pt reports received chemo for multiple myeloma.   Last treatment was Friday.

## 2016-12-02 NOTE — ED Provider Notes (Signed)
[facility] DEPT Provider Note   CSN: [id number] Arrival date & time: 12/02/16  1046  History   Chief Complaint Chief Complaint  Patient presents with  . Abdominal Pain    HPI Brian Liu is a 66 y.o. male with PMH of alcoholic pancreatitis, afib not on anticoagulation, ESRD on dialysis, and multiple myeloma presenting with abdominal pain.   Symptoms started last night without any clear precipitating event. Pain is located diffusely across his abdomen and is severe in nature. He has some associated vomiting since last night. No diarrhea. No fevers. No treatments tried. He was at dialysis earlier today but had to leave early due to the pain.   HPI  Past Medical History:  Diagnosis Date  . Atrial fibrillation (HCC)    Permanent, Not on Coumadin because of history of lower GI bleed  . Diastolic dysfunction    Restrictive  diastolic filling pattern echo,  February, 2013  . Dyslipidemia   . Ejection fraction    EF 60-65%, echo, 2013  . ESRD (end stage renal disease) ([place])    Dialysis, fistula in left arm  . Hypertension   . Lower gastrointestinal bleed   . LVH (left ventricular hypertrophy)   . 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 Mitral regurgitation    Mild by echo, 2013  . Multiple myeloma ([place]) 04/30/2016  . Pancytopenia ([place])    Dr Ignacia Bayley  . Pulmonary hypertension (HCC)    Severe, PA pressure 65-70 mm of mercury, echo, 2013  . Tobacco abuse     Patient Active Problem List   Diagnosis Date Noted  . Multiple myeloma ([place]) 04/30/2016  . Alcoholic pancreatitis 66/01/3015  . Complication of dialysis access insertion 05/17/2014  . ESRD (end stage renal disease) ([place])   . Atrial fibrillation ([place])   . Hypertension   . Pulmonary hypertension ([place])   . Mitral regurgitation   . Tobacco abuse   . Dyslipidemia   . Lower gastrointestinal bleed   . Pancytopenia ([place])   . Ejection fraction   . Diastolic dysfunction   . LVH (left  ventricular hypertrophy)     Past Surgical History:  Procedure Laterality Date  . AV FISTULA PLACEMENT Left 1992   Left arm   . 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 Authorizing Provider  acyclovir (ZOVIRAX) 400 MG tablet Take 1 tablet (400 mg total) by mouth 2 (two) times daily. 10/25/16   Baird Cancer, PA-C  AZOPT 1 % ophthalmic suspension  05/22/14   Historical Provider, MD  bimatoprost (LUMIGAN) 0.03 % ophthalmic solution 1 drop.    Historical Provider, MD  Bortezomib (VELCADE IJ) Inject as directed. Days 1, 4, 8, 11 every 21 days.    Historical Provider, MD  cinacalcet (SENSIPAR) 30 MG tablet Take 30 mg by mouth.    Historical Provider, MD  cyclophosphamide (CYTOXAN) 50 MG capsule Take  tablets (250 mg total) by mouth daily. Take on days 1,8,15 of chemo 1hr before or 2hr after meals every 21d. 11/26/16   Baird Cancer, PA-C  dexamethasone (DECADRON) 4 MG tablet Take 10 tablets (40 mg) on days 1, 8, and 15 of chemo. Repeat every 21 days. Take 2 tablets (84m) daily for 2 days starting after days 1 and 8 of chemotherapy. 10/25/16   TManon Hilding  Kefalas, PA-C  folic acid (FOLVITE) 1 MG tablet Take 1 mg by mouth daily.    Historical Provider, MD  gabapentin (NEURONTIN) 300 MG capsule Take 1 capsule (300 mg total) by mouth 3 (three) times daily. 01/19/15   Donika K Patel, DO  labetalol (NORMODYNE) 100 MG tablet TAKE 1 BY MOUTH TWO TIMES  DAILY 07/04/16   Herminio Commons, MD  lanthanum (FOSRENOL) 500 MG chewable tablet Chew 1,000 mg by mouth 3 (three) times daily with meals. And 1 with snacks    Historical Provider, MD  latanoprost (XALATAN) 0.005 % ophthalmic solution Place 1 drop into both eyes at bedtime. 07/13/14   Historical Provider, MD  levobunolol (BETAGAN) 0.5 % ophthalmic solution  06/28/16   Historical Provider, MD  levothyroxine  (SYNTHROID, LEVOTHROID) 137 MCG tablet Take 137 mcg by mouth daily before breakfast.    Historical Provider, MD  midodrine (PROAMATINE) 10 MG tablet  10/26/16   Historical Provider, MD  minoxidil (LONITEN) 2.5 MG tablet  05/13/16   Historical Provider, MD  Multiple Vitamin (DAILY VITE) TABS Take 1 tablet by mouth daily.     Historical Provider, MD  ondansetron (ZOFRAN) 8 MG tablet Take 1 tablet (8 mg total) by mouth every 8 (eight) hours as needed for nausea or vomiting. 10/29/16   Baird Cancer, PA-C  prochlorperazine (COMPAZINE) 10 MG tablet Take 1 tablet (10 mg total) by mouth every 6 (six) hours as needed for nausea or vomiting. 10/29/16   Manon Hilding Kefalas, PA-C  SIMBRINZA 1-0.2 % SUSP INSTILL 1 DROP IN INTO BOTH EYES TWICE A DAY 01/03/15   Historical Provider, MD  simvastatin (ZOCOR) 20 MG tablet Take 20 mg by mouth daily with supper.      Historical Provider, MD  UNABLE TO FIND Take 1 tablet by mouth. Dailyvite 800 mg    Historical Provider, MD    Family History Family History  Problem Relation Age of Onset  . Other Mother     Deceased, 36  . Stomach cancer Father     Deceased, 43  . Kidney failure Son   . Healthy Son     Social History Social History  Substance Use Topics  . Smoking status: Never Smoker  . Smokeless tobacco: Never Used  . Alcohol use No     Allergies   Patient has no known allergies.   Review of Systems Review of Systems  Constitutional: Negative.  Negative for fever.  HENT: Negative.   Eyes: Negative.   Respiratory: Negative.   Cardiovascular: Negative.   Gastrointestinal: Positive for abdominal pain, nausea and vomiting. Negative for diarrhea.  Endocrine: Negative.   Genitourinary: Negative.   Musculoskeletal: Negative.   Skin: Negative.   Allergic/Immunologic: Negative.   Neurological: Negative.   Hematological: Negative.   Psychiatric/Behavioral: Negative.      Physical Exam Updated Vital Signs BP (!) 163/100   Pulse 66   Temp 97.6  F (36.4 C) (Rectal)   Resp (!) 6   Ht 6' (1.829 m)   Wt 65.8 kg   SpO2 95%   BMI 19.67 kg/m   Physical Exam  Constitutional: He is oriented to person, place, and time. He appears well-developed. He appears distressed (Appears in pain).  HENT:  Head: Normocephalic and atraumatic.  Eyes: EOM are normal. Pupils are equal, round, and reactive to light.  Neck: Normal range of motion. Neck supple.  Cardiovascular: Normal rate.  An irregularly irregular rhythm present.  No murmur heard. Pulmonary/Chest: Effort normal and breath  sounds normal. No respiratory distress. He has no wheezes.  Abdominal: Normal appearance. There is tenderness (Diffuse tenderness). There is guarding.  Musculoskeletal: Normal range of motion.  Neurological: He is alert and oriented to person, place, and time.  Skin: Skin is warm and dry.  Psychiatric: He has a normal mood and affect. His behavior is normal. Judgment and thought content normal.  Nursing note and vitals reviewed.    ED Treatments / Results  Labs (all labs ordered are listed, but only abnormal results are displayed) Labs Reviewed  COMPREHENSIVE METABOLIC PANEL - Abnormal; Notable for the following:       Result Value   Potassium 3.4 (*)    Chloride 97 (*)    BUN 24 (*)    Creatinine, Ser 6.10 (*)    Calcium 8.4 (*)    GFR calc non Af Amer 9 (*)    GFR calc Af Amer 10 (*)    Anion gap 16 (*)    All other components within normal limits  CBC WITH DIFFERENTIAL/PLATELET - Abnormal; Notable for the following:    Hemoglobin 12.2 (*)    HCT 37.0 (*)    Platelets 74 (*)    Lymphs Abs 0.6 (*)    All other components within normal limits  LIPASE, BLOOD - Abnormal; Notable for the following:    Lipase 66 (*)    All other components within normal limits  CULTURE, BLOOD (ROUTINE X 2)  CULTURE, BLOOD (ROUTINE X 2)  PROTIME-INR  URINALYSIS, ROUTINE W REFLEX MICROSCOPIC  I-STAT CG4 LACTIC ACID, ED    EKG  EKG Interpretation None        Radiology US Abdomen Complete  Result Date: 12/02/2016 CLINICAL DATA:  66 year old male with history of right upper quadrant abdominal pain for 1 day. EXAM: ABDOMEN ULTRASOUND COMPLETE COMPARISON:  No priors.  CT of abdomen and pelvis 12/02/2016. FINDINGS: Gallbladder: Small echogenic foci without significant posterior acoustic shadowing, compatible with the small gallstones noted on recent CT examination. Gallbladder is only moderately distended. Gallbladder wall thickness is normal (2.1 mm). No pericholecystic fluid. Per report from the sonographer, the patient did not exhibit a sonographic Murphy's sign on examination. Common bile duct: Diameter: 5.5 mm Liver: Heterogeneous echotexture throughout the hepatic parenchyma, without discrete hepatic lesions. No intra hepatic biliary ductal dilatation. Normal hepatopetal flow in the portal vein. IVC: No abnormality visualized. Pancreas: Visualized portion unremarkable. Spleen: Normal in size. 2.1 x 2.3 x 1.9 cm anechoic to hypoechoic lesion with a few internal septations, increase through transmission and no internal blood flow, indeterminate, but favored to represent a minimally complex cyst. Notably, a lesion of this size has been present on prior CT examinations dating back to 2013, presumably benign. Right Kidney: Length: 11.3 cm. Diffuse cortical thinning and diffusely increased cortical echogenicity, indicative of underlying medical renal disease. No hydronephrosis visualized. Multiple anechoic lesions with increased through transmission, compatible with simple cysts, measuring up to 2.8 x 2.8 x 2.9 cm in the upper pole. Innumerable other smaller lesions range from anechoic to hypoechoic, incompletely characterized, but favored to represent small cysts of varying degrees of complexity. Left Kidney: Length: 7.3 cm. Diffuse cortical thinning and diffusely increased cortical echogenicity, indicative of underlying medical renal disease. No hydronephrosis  visualized. Multiple anechoic lesions with increased through transmission, compatible with simple cysts, measuring up to 1.2 x 1.2 x 1.2 cm in the interpolar region. Innumerable other smaller lesions range from anechoic to hypoechoic, incompletely characterized, but favored to represent small cysts of  varying degrees of complexity. Abdominal aorta: No aneurysm visualized. Other findings: None. IMPRESSION: 1. Severe cortical thinning in the kidneys bilaterally, with diffuse increased echogenicity throughout the renal parenchyma, suggestive of underlying medical renal disease. Associated with this, there are innumerable cystic lesions in the kidneys bilaterally, likely related to underlying renal disease. 2. Cholelithiasis without evidence of acute cholecystitis at this time. 3. Indeterminate lesion in the spleen which appears stable compared to numerous prior CT examinations going back several years, presumably a benign mildly complex cyst. Electronically Signed   By: Vinnie Langton M.D.   On: 12/02/2016 14:32   Ct Renal Stone Study  Result Date: 12/02/2016 CLINICAL DATA:  Abdominal pain and vomiting. EXAM: CT ABDOMEN AND PELVIS WITHOUT CONTRAST TECHNIQUE: Multidetector CT imaging of the abdomen and pelvis was performed following the standard protocol without IV contrast. COMPARISON:  10/01/2011 FINDINGS: Lower chest: No acute abnormality.  Cardiac enlargement noted. Hepatobiliary: No focal liver abnormality. Small stones are identified within the dependent portion of the gallbladder. Pancreas: Unremarkable. No pancreatic ductal dilatation or surrounding inflammatory changes. Spleen: No change in 2.5 cm low-attenuation structure within the spleen, image number 9 of series 2. Adrenals/Urinary Tract: The adrenal glands are normal. End-stage bilateral kidneys identified containing multiple cysts. Bilateral renal calculi are identified on the left these measure up to 5 mm. On the right these measure up to 5 mm. No  hydronephrosis or hydroureter identified. No ureteral lithiasis. Calcified mass within the left lower quadrant of the abdomen compatible with chronic infarcted renal graft. Urinary bladder is unremarkable for degree of distention. Stomach/Bowel: The stomach appears normal. Left upper quadrant small bowel loops exhibit mild wall thickening. No abnormal dilatation of the small or large bowel loops. No evidence for obstruction. Vascular/Lymphatic: Aortic atherosclerosis. No aneurysm. No upper abdominal adenopathy. Reproductive: Prostate is unremarkable. Other: No abdominal wall hernia or abnormality. No abdominopelvic ascites. Musculoskeletal: Changes of renal osteodystrophy identified. Multiple Schmorl node deformities are noted within the lumbar spine. IMPRESSION: 1. There is mild wall thickening involving left upper quadrant small bowel loops. Nonspecific in may reflect generalized anasarca secondary to renal failure. Cannot rule out enteritis. 2. Bilateral end-stage cystic kidneys. Renal calculi are noted bilaterally without evidence for hydronephrosis. 3. 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 4.  Aortic Atherosclerosis (ICD10-I70.0). 5. Gallstones .er Electronically Signed   By: Kerby Moors M.D.   On: 12/02/2016 13:05    Procedures Procedures (including critical care time)  Medications Ordered in ED Medications  fentaNYL (SUBLIMAZE) injection 50 mcg (50 mcg Intravenous Given 12/02/16 1147)  ondansetron (ZOFRAN) injection 4 mg (4 mg Intravenous Given 12/02/16 1147)  fentaNYL (SUBLIMAZE) injection 50 mcg (50 mcg Intravenous Given 12/02/16 1325)  fentaNYL (SUBLIMAZE) injection 50 mcg (50 mcg Intravenous Given 12/02/16 1501)     Initial Impression / Assessment and Plan / ED Course  I have reviewed the triage vital signs and the nursing notes.  Pertinent labs & imaging results that were available during my care of the patient were reviewed by me and considered in my medical decision  making (see chart for details).  Patient is a 66 year old male presenting with severe abdominal pain for 1 day. Diffuse pain and guarding noted on exam. Will check CBC with differential, CMET, lipase, lactic acid and CT abdomen. Will give a dose of fentanyl and zofran.   1:19 PM Symptoms improved after fentanyl and zofran. Work up thus far notable for elevated lipase to 66. CT abdomen shows possible enteritis and gallstones. Will  give another dose of fentanyl and check abdominal ultrasound to rule out cholecystitis vs choledocolithiasis.   2:53 PM Patient now with significant worsening of his symptoms. RUQ only with gallstones and no signs of cholecystitis or choledocolithiasis. Unclear etiology for patient's pain - he does have an elevated lipase and may have a degree of pancreatitis not visualized on CT. Given that his symptoms have not resolved with several rounds of IV pain medications, will admit for symptom control.  3:23 PM Discussed with triad hospitalist who will admit.   Final Clinical Impressions(s) / ED Diagnoses   Final diagnoses:  Abdominal pain, unspecified abdominal location    New Prescriptions New Prescriptions   No medications on file     Vivi Barrack, MD 12/02/16 [place], MD 12/06/16 1250

## 2016-12-03 ENCOUNTER — Telehealth (HOSPITAL_COMMUNITY): Payer: Self-pay | Admitting: Emergency Medicine

## 2016-12-03 ENCOUNTER — Other Ambulatory Visit (HOSPITAL_COMMUNITY): Payer: Self-pay | Admitting: Oncology

## 2016-12-03 ENCOUNTER — Inpatient Hospital Stay (HOSPITAL_COMMUNITY): Payer: Medicare Other

## 2016-12-03 ENCOUNTER — Other Ambulatory Visit (HOSPITAL_COMMUNITY): Payer: Medicare Other

## 2016-12-03 ENCOUNTER — Other Ambulatory Visit (HOSPITAL_COMMUNITY): Payer: Self-pay | Admitting: Emergency Medicine

## 2016-12-03 DIAGNOSIS — I120 Hypertensive chronic kidney disease with stage 5 chronic kidney disease or end stage renal disease: Secondary | ICD-10-CM | POA: Diagnosis present

## 2016-12-03 DIAGNOSIS — Y830 Surgical operation with transplant of whole organ as the cause of abnormal reaction of the patient, or of later complication, without mention of misadventure at the time of the procedure: Secondary | ICD-10-CM | POA: Diagnosis present

## 2016-12-03 DIAGNOSIS — K529 Noninfective gastroenteritis and colitis, unspecified: Secondary | ICD-10-CM | POA: Diagnosis not present

## 2016-12-03 DIAGNOSIS — E859 Amyloidosis, unspecified: Secondary | ICD-10-CM | POA: Diagnosis present

## 2016-12-03 DIAGNOSIS — I272 Pulmonary hypertension, unspecified: Secondary | ICD-10-CM | POA: Diagnosis present

## 2016-12-03 DIAGNOSIS — I482 Chronic atrial fibrillation: Secondary | ICD-10-CM | POA: Diagnosis not present

## 2016-12-03 DIAGNOSIS — T8612 Kidney transplant failure: Secondary | ICD-10-CM | POA: Diagnosis present

## 2016-12-03 DIAGNOSIS — N186 End stage renal disease: Secondary | ICD-10-CM | POA: Diagnosis not present

## 2016-12-03 DIAGNOSIS — D6959 Other secondary thrombocytopenia: Secondary | ICD-10-CM | POA: Diagnosis present

## 2016-12-03 DIAGNOSIS — C9000 Multiple myeloma not having achieved remission: Secondary | ICD-10-CM

## 2016-12-03 DIAGNOSIS — R197 Diarrhea, unspecified: Secondary | ICD-10-CM | POA: Diagnosis not present

## 2016-12-03 DIAGNOSIS — D61818 Other pancytopenia: Secondary | ICD-10-CM | POA: Diagnosis not present

## 2016-12-03 DIAGNOSIS — D631 Anemia in chronic kidney disease: Secondary | ICD-10-CM | POA: Diagnosis not present

## 2016-12-03 DIAGNOSIS — D472 Monoclonal gammopathy: Secondary | ICD-10-CM | POA: Diagnosis not present

## 2016-12-03 DIAGNOSIS — B370 Candidal stomatitis: Secondary | ICD-10-CM | POA: Diagnosis not present

## 2016-12-03 DIAGNOSIS — Z5112 Encounter for antineoplastic immunotherapy: Secondary | ICD-10-CM | POA: Diagnosis not present

## 2016-12-03 DIAGNOSIS — H409 Unspecified glaucoma: Secondary | ICD-10-CM | POA: Diagnosis present

## 2016-12-03 DIAGNOSIS — I340 Nonrheumatic mitral (valve) insufficiency: Secondary | ICD-10-CM | POA: Diagnosis present

## 2016-12-03 DIAGNOSIS — E876 Hypokalemia: Secondary | ICD-10-CM | POA: Diagnosis not present

## 2016-12-03 DIAGNOSIS — R109 Unspecified abdominal pain: Secondary | ICD-10-CM | POA: Diagnosis not present

## 2016-12-03 DIAGNOSIS — Z992 Dependence on renal dialysis: Secondary | ICD-10-CM | POA: Diagnosis not present

## 2016-12-03 DIAGNOSIS — N2581 Secondary hyperparathyroidism of renal origin: Secondary | ICD-10-CM | POA: Diagnosis present

## 2016-12-03 DIAGNOSIS — T451X5A Adverse effect of antineoplastic and immunosuppressive drugs, initial encounter: Secondary | ICD-10-CM | POA: Diagnosis present

## 2016-12-03 DIAGNOSIS — I700 Atherosclerosis of aorta: Secondary | ICD-10-CM | POA: Diagnosis not present

## 2016-12-03 DIAGNOSIS — I9589 Other hypotension: Secondary | ICD-10-CM | POA: Diagnosis present

## 2016-12-03 DIAGNOSIS — E785 Hyperlipidemia, unspecified: Secondary | ICD-10-CM | POA: Diagnosis present

## 2016-12-03 DIAGNOSIS — R509 Fever, unspecified: Secondary | ICD-10-CM | POA: Diagnosis not present

## 2016-12-03 DIAGNOSIS — R112 Nausea with vomiting, unspecified: Secondary | ICD-10-CM | POA: Diagnosis not present

## 2016-12-03 DIAGNOSIS — Z681 Body mass index (BMI) 19 or less, adult: Secondary | ICD-10-CM | POA: Diagnosis not present

## 2016-12-03 DIAGNOSIS — D638 Anemia in other chronic diseases classified elsewhere: Secondary | ICD-10-CM | POA: Diagnosis present

## 2016-12-03 DIAGNOSIS — T8089XA Other complications following infusion, transfusion and therapeutic injection, initial encounter: Secondary | ICD-10-CM | POA: Diagnosis not present

## 2016-12-03 DIAGNOSIS — K859 Acute pancreatitis without necrosis or infection, unspecified: Secondary | ICD-10-CM | POA: Diagnosis not present

## 2016-12-03 DIAGNOSIS — D61810 Antineoplastic chemotherapy induced pancytopenia: Secondary | ICD-10-CM | POA: Diagnosis present

## 2016-12-03 LAB — CBC
HCT: 35.2 % — ABNORMAL LOW (ref 39.0–52.0)
HEMOGLOBIN: 11.5 g/dL — AB (ref 13.0–17.0)
MCH: 26 pg (ref 26.0–34.0)
MCHC: 32.7 g/dL (ref 30.0–36.0)
MCV: 79.6 fL (ref 78.0–100.0)
Platelets: 51 10*3/uL — ABNORMAL LOW (ref 150–400)
RBC: 4.42 MIL/uL (ref 4.22–5.81)
RDW: 15 % (ref 11.5–15.5)
WBC: 5.1 10*3/uL (ref 4.0–10.5)

## 2016-12-03 LAB — COMPREHENSIVE METABOLIC PANEL
ALK PHOS: 56 U/L (ref 38–126)
ALT: 15 U/L — ABNORMAL LOW (ref 17–63)
AST: 14 U/L — AB (ref 15–41)
Albumin: 3.4 g/dL — ABNORMAL LOW (ref 3.5–5.0)
Anion gap: 15 (ref 5–15)
BILIRUBIN TOTAL: 0.7 mg/dL (ref 0.3–1.2)
BUN: 32 mg/dL — AB (ref 6–20)
CALCIUM: 8.2 mg/dL — AB (ref 8.9–10.3)
CO2: 27 mmol/L (ref 22–32)
CREATININE: 7.45 mg/dL — AB (ref 0.61–1.24)
Chloride: 98 mmol/L — ABNORMAL LOW (ref 101–111)
GFR calc Af Amer: 8 mL/min — ABNORMAL LOW (ref >60)
GFR, EST NON AFRICAN AMERICAN: 7 mL/min — AB (ref >60)
GLUCOSE: 74 mg/dL (ref 65–99)
POTASSIUM: 3.4 mmol/L — AB (ref 3.5–5.1)
Sodium: 140 mmol/L (ref 135–145)
TOTAL PROTEIN: 6.8 g/dL (ref 6.5–8.1)

## 2016-12-03 LAB — HIV ANTIBODY (ROUTINE TESTING W REFLEX): HIV Screen 4th Generation wRfx: NONREACTIVE

## 2016-12-03 LAB — MRSA PCR SCREENING: MRSA BY PCR: NEGATIVE

## 2016-12-03 MED ORDER — METRONIDAZOLE IN NACL 5-0.79 MG/ML-% IV SOLN
500.0000 mg | Freq: Three times a day (TID) | INTRAVENOUS | Status: DC
  Administered 2016-12-03 – 2016-12-06 (×10): 500 mg via INTRAVENOUS
  Filled 2016-12-03 (×11): qty 100

## 2016-12-03 MED ORDER — SODIUM CHLORIDE 0.9 % IV BOLUS (SEPSIS)
500.0000 mL | Freq: Once | INTRAVENOUS | Status: AC
  Administered 2016-12-03: 500 mL via INTRAVENOUS

## 2016-12-03 MED ORDER — CIPROFLOXACIN IN D5W 400 MG/200ML IV SOLN
400.0000 mg | INTRAVENOUS | Status: DC
  Administered 2016-12-03 – 2016-12-06 (×4): 400 mg via INTRAVENOUS
  Filled 2016-12-03 (×4): qty 200

## 2016-12-03 NOTE — Care Management Obs Status (Signed)
[facility] NOTIFICATION   Patient Details  Name: Brian Liu MRN: [id number] Date of Birth: [birth year]   Medicare Observation Status Notification Given:  Yes    Sherald Barge, RN 12/03/2016, 2:00 PM

## 2016-12-03 NOTE — Care Management Note (Signed)
Case Management Note  Patient Details  Name: Brian Liu MRN: [id number] Date of Birth: 1951/02/17  Subjective/Objective:                  Pt admitted with abd pain. He is from home, lives with his son. Son is at bedside. Pt reports being ind with bathing and dressing, he uses cane with ambulation. He drives himself to appointments. He has PCP, he goes to OP HD MWF at [facility] in [place]. He has no HH serivces PTA. He is undergoing chemo for myeloma. NO needs communicated.   Action/Plan: Pt and son plan on pt returning home with self care. CM will follow to DC.   Expected Discharge Date:      12/03/2016            Expected Discharge Plan:  Home/Self Care  In-House Referral:  NA  Discharge planning Services  CM Consult  Post Acute Care Choice:  NA Choice offered to:  NA  Status of Service:  In process, will continue to follow  Sherald Barge, RN 12/03/2016, 2:00 PM

## 2016-12-03 NOTE — Telephone Encounter (Signed)
Spoke with pts brother about coming to the [facility] clinic.  Explained it is help on the second floor from 9-11 am on may 8th (Tuesday).  I explained he would see social work, dietician, and occupational therapy.  He verbalized understanding.

## 2016-12-03 NOTE — Progress Notes (Signed)
Pharmacy Antibiotic Note  RIAD WAGLEY is a 66 y.o. male admitted on 12/02/2016 with intra abdominal infection.  Pharmacy has been consulted for CIPRO dosing.  Plan: Cipro 400mg  IV q24hrs (renally adjusted) Monitor labs, progress, c/s  Height: 6' (182.9 cm) Weight: 140 lb 8 oz (63.7 kg) IBW/kg (Calculated) : 77.6  Temp (24hrs), Avg:97.6 F (36.4 C), Min:97.4 F (36.3 C), Max:97.7 F (36.5 C)   Recent Labs Lab 11/26/16 1255 12/02/16 1127 12/02/16 1140 12/03/16 0514  WBC 5.7 5.1  --  5.1  CREATININE 8.33* 6.10*  --  7.45*  LATICACIDVEN  --   --  1.58  --     Estimated Creatinine Clearance: 9 mL/min (A) (by C-G formula based on SCr of 7.45 mg/dL (H)).    No Known Allergies  Antimicrobials this admission: Cipro 5/1 >>  Flagyl 5/1 >>   Dose adjustments this admission:  Microbiology results:  BCx: pending  MRSA PCR: negative  Thank you for allowing pharmacy to be a part of this patient's care.  Hart Robinsons A 12/03/2016 10:46 AM

## 2016-12-03 NOTE — Progress Notes (Signed)
Notified the MD that I would not be able to obtain the UA due the patient being an dialysis patient.  He stated that he does not make urine.  MD stated that it was okay to dc the order.

## 2016-12-03 NOTE — Progress Notes (Signed)
Patient refused to take any of his scheduled medications this am, except for the ones charted.  He states that he needs to see his oncologist prior to taking the medication. He is adamant and  Anxious that taking this med will cause his labs to be abnormal or not accurate. Voiced to him the meds that he is taking and that  the meds should not affect his medications since the MD has reviewed his medications.  He states that he may take them after his labs or he sees the oncologist.  I voiced to him that I would be glad to bring them back later if he chooses to take them as long as the do not overlap.  He verbalized understanding.

## 2016-12-03 NOTE — Progress Notes (Signed)
PROGRESS NOTE                                                                                                                                                                                                             Patient Demographics:    Brian Liu, is a 66 y.o. male, DOB - [birth year], GGY:[id number]  Admit date - 12/02/2016   Admitting Physician Jani Gravel, MD  Outpatient Primary MD for the patient is [name], MD  LOS - 0  Chief Complaint  Patient presents with  . Abdominal Pain       Brief Narrative    66 y.o. male, w Hypertension, Pafib (chadsvasc2=3), ESRD on HD (M, W, F), apparently c/o n/v, starting on Sunday, CT abdomen pelvis suspicious for enteritis.   Subjective:    Brian Liu today has, No headache, No chest pain, Still reports abdominal pain, currently denies any nausea and vomiting and asking for food this a.m. .   Assessment  & Plan :    Active Problems:   Hypertension   ESRD (end stage renal disease) (HCC)   Pancreatitis   Hypokalemia   Anemia   Abdominal pain   Abdominal pain, nausea vomiting - This is most likely related to enteritis as evident on CT abdomen pelvis, as well he reports diarrhea last Sunday, will start on ciprofloxacin and Flagyl. - moderately elevated lipase, but no evidence of pancreatitis on imaging,does not look ischemic colitis as pain is not associated with food. - Continue with Protonix as gastritis/PUD on the differentials. - We'll start on clear liquid diet and advance as tolerated - No recurrence of diarrhea,   Hypokalemia - Will be managed with dialysis  Anemia - Secondary to multiple myeloma and end-stage renal disease, hemoglobin at baseline.  Multiple myeloma - Was supposed to receive chemotherapy today, discussed with him from oncology, has a scheduled follow-up appointment with them for this coming Friday .  Thrombocytopenia - Most likely related to chemotherapy,  discontinue subcutaneous heparin  End-stage renal disease - We'll consult renal as patient is anticipated to be in the hospital tomorrow  Hyperlipidemia - Continue with statin   Code Status : Full  Family Communication  : None at bedside  Disposition Plan  : home when stable  Consults  :  Renal, D/W Oncology via phone  Procedures  :  none  DVT Prophylaxis  :  SCDs   Lab Results  Component Value Date   PLT 51 (L) 12/03/2016    Antibiotics  :    Anti-infectives    Start     Dose/Rate Route Frequency Ordered Stop   12/03/16 1100  ciprofloxacin (CIPRO) IVPB 400 mg     400 mg 200 mL/hr over 60 Minutes Intravenous Every 24 hours 12/03/16 1016     12/03/16 1000  metroNIDAZOLE (FLAGYL) IVPB 500 mg     500 mg 100 mL/hr over 60 Minutes Intravenous Every 8 hours 12/03/16 0949     12/02/16 2200  acyclovir (ZOVIRAX) 200 MG capsule 400 mg  Status:  Discontinued     400 mg Oral 2 times daily 12/02/16 1725 12/02/16 1909        Objective:   Vitals:   12/02/16 1630 12/02/16 1747 12/02/16 2229 12/03/16 0601  BP: (!) 142/89 (!) 155/111 109/60 110/71  Pulse:  91 90 85  Resp:  '16 20 18  ' Temp:  97.7 F (36.5 C) 97.5 F (36.4 C) 97.4 F (36.3 C)  TempSrc:  Oral Oral Oral  SpO2:  100% 99% 98%  Weight:  63.7 kg (140 lb 8 oz)    Height:  6' (1.829 m)      Wt Readings from Last 3 Encounters:  12/02/16 63.7 kg (140 lb 8 oz)  11/29/16 65.9 kg (145 lb 3.2 oz)  11/26/16 68 kg (149 lb 14.4 oz)     Intake/Output Summary (Last 24 hours) at 12/03/16 1324 Last data filed at 12/02/16 1820  Gross per 24 hour  Intake               60 ml  Output                0 ml  Net               60 ml     Physical Exam  Awake Alert, Oriented X 3, chronically-ill-appearing Supple Neck,No JVD, .  Symmetrical Chest wall movement, Good air movement bilaterally, CTAB RRR,No Gallops,Rubs or new Murmurs, No Parasternal Heave +ve B.Sounds, Abd Soft, mild epigastric tenderness, No rebound - guarding  or rigidity. No Cyanosis, Clubbing or edema, No new Rash or bruise     Data Review:    CBC  Recent Labs Lab 12/02/16 1127 12/03/16 0514  WBC 5.1 5.1  HGB 12.2* 11.5*  HCT 37.0* 35.2*  PLT 74* 51*  MCV 78.9 79.6  MCH 26.0 26.0  MCHC 33.0 32.7  RDW 15.0 15.0  LYMPHSABS 0.6*  --   MONOABS 0.4  --   EOSABS 0.1  --   BASOSABS 0.0  --     Chemistries   Recent Labs Lab 12/02/16 1127 12/03/16 0514  NA 139 140  K 3.4* 3.4*  CL 97* 98*  CO2 26 27  GLUCOSE 76 74  BUN 24* 32*  CREATININE 6.10* 7.45*  CALCIUM 8.4* 8.2*  AST 28 14*  ALT 21 15*  ALKPHOS 57 56  BILITOT 0.7 0.7   ------------------------------------------------------------------------------------------------------------------ No results for input(s): CHOL, HDL, LDLCALC, TRIG, CHOLHDL, LDLDIRECT in the last 72 hours.  No results found for: HGBA1C ------------------------------------------------------------------------------------------------------------------ No results for input(s): TSH, T4TOTAL, T3FREE, THYROIDAB in the last 72 hours.  Invalid input(s): FREET3 ------------------------------------------------------------------------------------------------------------------ No results for input(s): VITAMINB12, FOLATE, FERRITIN, TIBC, IRON, RETICCTPCT in the last 72 hours.  Coagulation profile  Recent Labs Lab 12/02/16 1127  INR 1.08    No results  for input(s): DDIMER in the last 72 hours.  Cardiac Enzymes No results for input(s): CKMB, TROPONINI, MYOGLOBIN in the last 168 hours.  Invalid input(s): CK ------------------------------------------------------------------------------------------------------------------ No results found for: BNP  Inpatient Medications  Scheduled Meds: . brinzolamide  1 drop Both Eyes BID   And  . brimonidine  1 drop Both Eyes BID  . cinacalcet  30 mg Oral Q breakfast  . cycloSPORINE  1 drop Both Eyes BID  . Difluprednate  1 drop Right Eye 6 X Daily  . folic  acid  1 mg Oral Daily  . ketorolac  1 drop Both Eyes Daily  . lanthanum  1,000 mg Oral TID WC  . latanoprost  1 drop Both Eyes QHS  . levothyroxine  137 mcg Oral QAC breakfast  . multivitamin with minerals  1 tablet Oral Daily  . pantoprazole (PROTONIX) IV  40 mg Intravenous Q12H  . saccharomyces boulardii  250 mg Oral BID  . simvastatin  20 mg Oral Q supper   Continuous Infusions: . ciprofloxacin Stopped (12/03/16 1359)  . metronidazole 500 mg (12/03/16 1110)   PRN Meds:.acetaminophen **OR** acetaminophen, HYDROmorphone (DILAUDID) injection, lanthanum **AND** lanthanum, prochlorperazine  Micro Results Recent Results (from the past 240 hour(s))  Culture, blood (Routine x 2)     Status: None (Preliminary result)   Collection Time: 12/02/16 11:32 AM  Result Value Ref Range Status   Specimen Description BLOOD RIGHT ARM  Final   Special Requests   Final    BOTTLES DRAWN AEROBIC AND ANAEROBIC Blood Culture results may not be optimal due to an inadequate volume of blood received in culture bottles   Culture NO GROWTH < 24 HOURS  Final   Report Status PENDING  Incomplete  Culture, blood (Routine x 2)     Status: None (Preliminary result)   Collection Time: 12/02/16 11:40 AM  Result Value Ref Range Status   Specimen Description BLOOD RIGHT HAND  Final   Special Requests   Final    BOTTLES DRAWN AEROBIC AND ANAEROBIC Blood Culture results may not be optimal due to an inadequate volume of blood received in culture bottles   Culture NO GROWTH < 24 HOURS  Final   Report Status PENDING  Incomplete  MRSA PCR Screening     Status: None   Collection Time: 12/02/16  6:20 PM  Result Value Ref Range Status   MRSA by PCR NEGATIVE NEGATIVE Final    Comment:        The GeneXpert MRSA Assay (FDA approved for NASAL specimens only), is one component of a comprehensive MRSA colonization surveillance program. It is not intended to diagnose MRSA infection nor to guide or monitor treatment for MRSA  infections.     Radiology Reports US Abdomen Complete  Result Date: 12/02/2016 CLINICAL DATA:  66 year old male with history of right upper quadrant abdominal pain for 1 day. EXAM: ABDOMEN ULTRASOUND COMPLETE COMPARISON:  No priors.  CT of abdomen and pelvis 12/02/2016. FINDINGS: Gallbladder: Small echogenic foci without significant posterior acoustic shadowing, compatible with the small gallstones noted on recent CT examination. Gallbladder is only moderately distended. Gallbladder wall thickness is normal (2.1 mm). No pericholecystic fluid. Per report from the sonographer, the patient did not exhibit a sonographic Murphy's sign on examination. Common bile duct: Diameter: 5.5 mm Liver: Heterogeneous echotexture throughout the hepatic parenchyma, without discrete hepatic lesions. No intra hepatic biliary ductal dilatation. Normal hepatopetal flow in the portal vein. IVC: No abnormality visualized. Pancreas: Visualized portion unremarkable. Spleen: Normal in  size. 2.1 x 2.3 x 1.9 cm anechoic to hypoechoic lesion with a few internal septations, increase through transmission and no internal blood flow, indeterminate, but favored to represent a minimally complex cyst. Notably, a lesion of this size has been present on prior CT examinations dating back to 2013, presumably benign. Right Kidney: Length: 11.3 cm. Diffuse cortical thinning and diffusely increased cortical echogenicity, indicative of underlying medical renal disease. No hydronephrosis visualized. Multiple anechoic lesions with increased through transmission, compatible with simple cysts, measuring up to 2.8 x 2.8 x 2.9 cm in the upper pole. Innumerable other smaller lesions range from anechoic to hypoechoic, incompletely characterized, but favored to represent small cysts of varying degrees of complexity. Left Kidney: Length: 7.3 cm. Diffuse cortical thinning and diffusely increased cortical echogenicity, indicative of underlying medical renal  disease. No hydronephrosis visualized. Multiple anechoic lesions with increased through transmission, compatible with simple cysts, measuring up to 1.2 x 1.2 x 1.2 cm in the interpolar region. Innumerable other smaller lesions range from anechoic to hypoechoic, incompletely characterized, but favored to represent small cysts of varying degrees of complexity. Abdominal aorta: No aneurysm visualized. Other findings: None. IMPRESSION: 1. Severe cortical thinning in the kidneys bilaterally, with diffuse increased echogenicity throughout the renal parenchyma, suggestive of underlying medical renal disease. Associated with this, there are innumerable cystic lesions in the kidneys bilaterally, likely related to underlying renal disease. 2. Cholelithiasis without evidence of acute cholecystitis at this time. 3. Indeterminate lesion in the spleen which appears stable compared to numerous prior CT examinations going back several years, presumably a benign mildly complex cyst. Electronically Signed   By: Vinnie Langton M.D.   On: 12/02/2016 14:32   Ct Renal Stone Study  Result Date: 12/02/2016 CLINICAL DATA:  Abdominal pain and vomiting. EXAM: CT ABDOMEN AND PELVIS WITHOUT CONTRAST TECHNIQUE: Multidetector CT imaging of the abdomen and pelvis was performed following the standard protocol without IV contrast. COMPARISON:  10/01/2011 FINDINGS: Lower chest: No acute abnormality.  Cardiac enlargement noted. Hepatobiliary: No focal liver abnormality. Small stones are identified within the dependent portion of the gallbladder. Pancreas: Unremarkable. No pancreatic ductal dilatation or surrounding inflammatory changes. Spleen: No change in 2.5 cm low-attenuation structure within the spleen, image number 9 of series 2. Adrenals/Urinary Tract: The adrenal glands are normal. End-stage bilateral kidneys identified containing multiple cysts. Bilateral renal calculi are identified on the left these measure up to 5 mm. On the right  these measure up to 5 mm. No hydronephrosis or hydroureter identified. No ureteral lithiasis. Calcified mass within the left lower quadrant of the abdomen compatible with chronic infarcted renal graft. Urinary bladder is unremarkable for degree of distention. Stomach/Bowel: The stomach appears normal. Left upper quadrant small bowel loops exhibit mild wall thickening. No abnormal dilatation of the small or large bowel loops. No evidence for obstruction. Vascular/Lymphatic: Aortic atherosclerosis. No aneurysm. No upper abdominal adenopathy. Reproductive: Prostate is unremarkable. Other: No abdominal wall hernia or abnormality. No abdominopelvic ascites. Musculoskeletal: Changes of renal osteodystrophy identified. Multiple Schmorl node deformities are noted within the lumbar spine. IMPRESSION: 1. There is mild wall thickening involving left upper quadrant small bowel loops. Nonspecific in may reflect generalized anasarca secondary to renal failure. Cannot rule out enteritis. 2. Bilateral end-stage cystic kidneys. Renal calculi are noted bilaterally without evidence for hydronephrosis. 3. 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 4.  Aortic Atherosclerosis (ICD10-I70.0). 5. Gallstones .er Electronically Signed   By: Kerby Moors M.D.   On: 12/02/2016 13:05  [facility], Kearia Yin M.D on 12/03/2016 at 1:24 PM  Between 7am to 7pm - Pager - [phone]  After 7pm go to [email or web address] - password [email or web address]  [facility] -  Office  [phone]

## 2016-12-03 NOTE — Consult Note (Signed)
Reason for Consult: End-stage renal disease Referring Physician: Dr. Donivan Scull is an 66 y.o. male.  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 According the patient she went to dialysis yesterday but he didn't have any improvement. Hence decided to come to the hospital. Patient denies any fever, chills or sweating. He denies also any diarrhea. Presently his abdominal pain seems to be getting better.  Past Medical History:  Diagnosis Date  . Atrial fibrillation (HCC)    Permanent, Not on Coumadin because of history of lower GI bleed  . Diastolic dysfunction    Restrictive  diastolic filling pattern echo,  February, 2013  . Dyslipidemia   . Ejection fraction    EF 60-65%, echo, 2013  . ESRD (end stage renal disease) ([place])    Dialysis, fistula in left arm  . Hypertension   . Lower gastrointestinal bleed   . LVH (left ventricular hypertrophy)   . 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 Mitral regurgitation    Mild by echo, 2013  . Multiple myeloma ([place]) 04/30/2016  . Pancytopenia ([place])    Dr Ignacia Bayley  . Pulmonary hypertension (HCC)    Severe, PA pressure 65-70 mm of mercury, echo, 2013  . Tobacco abuse     Past Surgical History:  Procedure Laterality Date  . AV FISTULA PLACEMENT Left 1992   Left arm   . 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 Other Mother     Deceased, 25  . Stomach cancer Father     Deceased, 12  . Kidney failure Son   . Healthy Son     Social History:  reports that he has never smoked. He has never used smokeless tobacco. He reports that he does not drink alcohol or use drugs.  Allergies: No  Known Allergies  Medications: I have reviewed the patient's current medications.  Results for orders placed or performed during the hospital encounter of 12/02/16 (from the past 48 hour(s))  Comprehensive metabolic panel     Status: Abnormal   Collection Time: 12/02/16 11:27 AM  Result Value Ref Range   Sodium 139 135 - 145 mmol/L   Potassium 3.4 (L) 3.5 - 5.1 mmol/L   Chloride 97 (L) 101 - 111 mmol/L   CO2 26 22 - 32 mmol/L   Glucose, Bld 76 65 - 99 mg/dL   BUN 24 (H) 6 - 20 mg/dL   Creatinine, Ser 6.10 (H) 0.61 - 1.24 mg/dL   Calcium 8.4 (L) 8.9 - 10.3 mg/dL   Total Protein 7.8 6.5 - 8.1 g/dL   Albumin 3.9 3.5 - 5.0 g/dL   AST 28 15 - 41 U/L   ALT 21 17 - 63 U/L   Alkaline Phosphatase 57 38 - 126 U/L   Total Bilirubin 0.7 0.3 - 1.2 mg/dL   GFR calc non Af Amer 9 (L) >60 mL/min   GFR calc Af Amer 10 (L) >60 mL/min    Comment: (NOTE) The eGFR has been calculated using the CKD EPI equation. This calculation has not been validated in all clinical situations. eGFR's persistently <60 mL/min signify possible Chronic Kidney Disease.    Anion gap 16 (H) 5 -  15  CBC with Differential     Status: Abnormal   Collection Time: 12/02/16 11:27 AM  Result Value Ref Range   WBC 5.1 4.0 - 10.5 K/uL   RBC 4.69 4.22 - 5.81 MIL/uL   Hemoglobin 12.2 (L) 13.0 - 17.0 g/dL   HCT 37.0 (L) 39.0 - 52.0 %   MCV 78.9 78.0 - 100.0 fL   MCH 26.0 26.0 - 34.0 pg   MCHC 33.0 30.0 - 36.0 g/dL   RDW 15.0 11.5 - 15.5 %   Platelets 74 (L) 150 - 400 K/uL    Comment: SPECIMEN CHECKED FOR CLOTS PLATELET COUNT CONFIRMED BY SMEAR    Neutrophils Relative % 79 %   Neutro Abs 4.0 1.7 - 7.7 K/uL   Lymphocytes Relative 12 %   Lymphs Abs 0.6 (L) 0.7 - 4.0 K/uL   Monocytes Relative 7 %   Monocytes Absolute 0.4 0.1 - 1.0 K/uL   Eosinophils Relative 2 %   Eosinophils Absolute 0.1 0.0 - 0.7 K/uL   Basophils Relative 0 %   Basophils Absolute 0.0 0.0 - 0.1 K/uL  Protime-INR     Status: None   Collection Time:  12/02/16 11:27 AM  Result Value Ref Range   Prothrombin Time 14.0 11.4 - 15.2 seconds   INR 1.08   Culture, blood (Routine x 2)     Status: None (Preliminary result)   Collection Time: 12/02/16 11:32 AM  Result Value Ref Range   Specimen Description BLOOD RIGHT ARM    Special Requests      BOTTLES DRAWN AEROBIC AND ANAEROBIC Blood Culture results may not be optimal due to an inadequate volume of blood received in culture bottles   Culture NO GROWTH < 24 HOURS    Report Status PENDING   Lipase, blood     Status: Abnormal   Collection Time: 12/02/16 11:32 AM  Result Value Ref Range   Lipase 66 (H) 11 - 51 U/L  HIV antibody (Routine Testing)     Status: None   Collection Time: 12/02/16 11:32 AM  Result Value Ref Range   HIV Screen 4th Generation wRfx Non Reactive Non Reactive    Comment: (NOTE) Performed At: [facility] [address] [place], [place] [id number] Lindon Romp MD CB:[id number]   I-Stat CG4 Lactic Acid, ED     Status: None   Collection Time: 12/02/16 11:40 AM  Result Value Ref Range   Lactic Acid, Venous 1.58 0.5 - 1.9 mmol/L  Culture, blood (Routine x 2)     Status: None (Preliminary result)   Collection Time: 12/02/16 11:40 AM  Result Value Ref Range   Specimen Description BLOOD RIGHT HAND    Special Requests      BOTTLES DRAWN AEROBIC AND ANAEROBIC Blood Culture results may not be optimal due to an inadequate volume of blood received in culture bottles   Culture NO GROWTH < 24 HOURS    Report Status PENDING   MRSA PCR Screening     Status: None   Collection Time: 12/02/16  6:20 PM  Result Value Ref Range   MRSA by PCR NEGATIVE NEGATIVE    Comment:        The GeneXpert MRSA Assay (FDA approved for NASAL specimens only), is one component of a comprehensive MRSA colonization surveillance program. It is not intended to diagnose MRSA infection nor to guide or monitor treatment for MRSA infections.   CBC     Status: Abnormal   Collection Time:  12/03/16  5:14 AM  Result Value Ref Range   WBC 5.1 4.0 - 10.5 K/uL   RBC 4.42 4.22 - 5.81 MIL/uL   Hemoglobin 11.5 (L) 13.0 - 17.0 g/dL   HCT 35.2 (L) 39.0 - 52.0 %   MCV 79.6 78.0 - 100.0 fL   MCH 26.0 26.0 - 34.0 pg   MCHC 32.7 30.0 - 36.0 g/dL   RDW 15.0 11.5 - 15.5 %   Platelets 51 (L) 150 - 400 K/uL    Comment: SPECIMEN CHECKED FOR CLOTS LARGE PLATELETS PRESENT PLATELETS APPEAR DECREASED   Comprehensive metabolic panel     Status: Abnormal   Collection Time: 12/03/16  5:14 AM  Result Value Ref Range   Sodium 140 135 - 145 mmol/L   Potassium 3.4 (L) 3.5 - 5.1 mmol/L   Chloride 98 (L) 101 - 111 mmol/L   CO2 27 22 - 32 mmol/L   Glucose, Bld 74 65 - 99 mg/dL   BUN 32 (H) 6 - 20 mg/dL   Creatinine, Ser 7.45 (H) 0.61 - 1.24 mg/dL   Calcium 8.2 (L) 8.9 - 10.3 mg/dL   Total Protein 6.8 6.5 - 8.1 g/dL   Albumin 3.4 (L) 3.5 - 5.0 g/dL   AST 14 (L) 15 - 41 U/L   ALT 15 (L) 17 - 63 U/L   Alkaline Phosphatase 56 38 - 126 U/L   Total Bilirubin 0.7 0.3 - 1.2 mg/dL   GFR calc non Af Amer 7 (L) >60 mL/min   GFR calc Af Amer 8 (L) >60 mL/min    Comment: (NOTE) The eGFR has been calculated using the CKD EPI equation. This calculation has not been validated in all clinical situations. eGFR's persistently <60 mL/min signify possible Chronic Kidney Disease.    Anion gap 15 5 - 15    US Abdomen Complete  Result Date: 12/02/2016 CLINICAL DATA:  66 year old male with history of right upper quadrant abdominal pain for 1 day. EXAM: ABDOMEN ULTRASOUND COMPLETE COMPARISON:  No priors.  CT of abdomen and pelvis 12/02/2016. FINDINGS: Gallbladder: Small echogenic foci without significant posterior acoustic shadowing, compatible with the small gallstones noted on recent CT examination. Gallbladder is only moderately distended. Gallbladder wall thickness is normal (2.1 mm). No pericholecystic fluid. Per report from the sonographer, the patient did not exhibit a sonographic Murphy's sign on  examination. Common bile duct: Diameter: 5.5 mm Liver: Heterogeneous echotexture throughout the hepatic parenchyma, without discrete hepatic lesions. No intra hepatic biliary ductal dilatation. Normal hepatopetal flow in the portal vein. IVC: No abnormality visualized. Pancreas: Visualized portion unremarkable. Spleen: Normal in size. 2.1 x 2.3 x 1.9 cm anechoic to hypoechoic lesion with a few internal septations, increase through transmission and no internal blood flow, indeterminate, but favored to represent a minimally complex cyst. Notably, a lesion of this size has been present on prior CT examinations dating back to 2013, presumably benign. Right Kidney: Length: 11.3 cm. Diffuse cortical thinning and diffusely increased cortical echogenicity, indicative of underlying medical renal disease. No hydronephrosis visualized. Multiple anechoic lesions with increased through transmission, compatible with simple cysts, measuring up to 2.8 x 2.8 x 2.9 cm in the upper pole. Innumerable other smaller lesions range from anechoic to hypoechoic, incompletely characterized, but favored to represent small cysts of varying degrees of complexity. Left Kidney: Length: 7.3 cm. Diffuse cortical thinning and diffusely increased cortical echogenicity, indicative of underlying medical renal disease. No hydronephrosis visualized. Multiple anechoic lesions with increased through transmission, compatible with simple cysts, measuring up to 1.2  x 1.2 x 1.2 cm in the interpolar region. Innumerable other smaller lesions range from anechoic to hypoechoic, incompletely characterized, but favored to represent small cysts of varying degrees of complexity. Abdominal aorta: No aneurysm visualized. Other findings: None. IMPRESSION: 1. Severe cortical thinning in the kidneys bilaterally, with diffuse increased echogenicity throughout the renal parenchyma, suggestive of underlying medical renal disease. Associated with this, there are innumerable  cystic lesions in the kidneys bilaterally, likely related to underlying renal disease. 2. Cholelithiasis without evidence of acute cholecystitis at this time. 3. Indeterminate lesion in the spleen which appears stable compared to numerous prior CT examinations going back several years, presumably a benign mildly complex cyst. Electronically Signed   By: Vinnie Langton M.D.   On: 12/02/2016 14:32   Ct Renal Stone Study  Result Date: 12/02/2016 CLINICAL DATA:  Abdominal pain and vomiting. EXAM: CT ABDOMEN AND PELVIS WITHOUT CONTRAST TECHNIQUE: Multidetector CT imaging of the abdomen and pelvis was performed following the standard protocol without IV contrast. COMPARISON:  10/01/2011 FINDINGS: Lower chest: No acute abnormality.  Cardiac enlargement noted. Hepatobiliary: No focal liver abnormality. Small stones are identified within the dependent portion of the gallbladder. Pancreas: Unremarkable. No pancreatic ductal dilatation or surrounding inflammatory changes. Spleen: No change in 2.5 cm low-attenuation structure within the spleen, image number 9 of series 2. Adrenals/Urinary Tract: The adrenal glands are normal. End-stage bilateral kidneys identified containing multiple cysts. Bilateral renal calculi are identified on the left these measure up to 5 mm. On the right these measure up to 5 mm. No hydronephrosis or hydroureter identified. No ureteral lithiasis. Calcified mass within the left lower quadrant of the abdomen compatible with chronic infarcted renal graft. Urinary bladder is unremarkable for degree of distention. Stomach/Bowel: The stomach appears normal. Left upper quadrant small bowel loops exhibit mild wall thickening. No abnormal dilatation of the small or large bowel loops. No evidence for obstruction. Vascular/Lymphatic: Aortic atherosclerosis. No aneurysm. No upper abdominal adenopathy. Reproductive: Prostate is unremarkable. Other: No abdominal wall hernia or abnormality. No abdominopelvic  ascites. Musculoskeletal: Changes of renal osteodystrophy identified. Multiple Schmorl node deformities are noted within the lumbar spine. IMPRESSION: 1. There is mild wall thickening involving left upper quadrant small bowel loops. Nonspecific in may reflect generalized anasarca secondary to renal failure. Cannot rule out enteritis. 2. Bilateral end-stage cystic kidneys. Renal calculi are noted bilaterally without evidence for hydronephrosis. 3. 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 4.  Aortic Atherosclerosis (ICD10-I70.0). 5. Gallstones .er Electronically Signed   By: Kerby Moors M.D.   On: 12/02/2016 13:05    Review of Systems  Constitutional: Negative for chills and fever.  Eyes: Positive for blurred vision.  Respiratory: Negative for cough, hemoptysis and shortness of breath.   Cardiovascular: Negative for orthopnea.  Gastrointestinal: Positive for abdominal pain, nausea and vomiting. Negative for diarrhea.   Blood pressure 110/71, pulse 85, temperature 97.4 F (36.3 C), temperature source Oral, resp. rate 18, height 6' (1.829 m), weight 63.7 kg (140 lb 8 oz), SpO2 98 %. Physical Exam  Constitutional: He is oriented to person, place, and time. No distress.  Eyes: No scleral icterus.  Neck: No JVD present.  Cardiovascular: Normal rate and regular rhythm.   Murmur heard. Respiratory: He has no wheezes. He has no rales.  GI: He exhibits no distension. There is tenderness. There is no rebound.  Musculoskeletal: He exhibits no edema.  Neurological: He is alert and oriented to person, place, and time.    Assessment/Plan: Problem #1 abdominal pain:  Patient with elevated lipase and hence thought to be secondary to pancreatitis. Presently he is feeling better. Problem #2 end-stage renal disease: He is status post hemodialysis yesterday. Presently his potassium is normal. 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 Presently patient is on Vacade and cyclophosphamide. 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 Plan: 1]We'll make arrangements for patient to get dialysis tomorrow 2]We will dialyze him for 3-1/2 hours 3]We'll remove 1 L if systolic blood pressures above 90 4]We'll check his renal panel and CBC in the morning.  Deniah Saia S 12/03/2016, 2:27 PM

## 2016-12-03 NOTE — Telephone Encounter (Signed)
Left message for son to call me back about [facility] clinic.

## 2016-12-03 NOTE — Progress Notes (Signed)
Spoke with the patients sons and made the patient and them aware that we do not have the Difluprednate drops and if he wanted to take the med in the hospital to please bring them from home.  Son verbalized understanding.

## 2016-12-04 ENCOUNTER — Telehealth (HOSPITAL_COMMUNITY): Payer: Self-pay | Admitting: Emergency Medicine

## 2016-12-04 DIAGNOSIS — K529 Noninfective gastroenteritis and colitis, unspecified: Secondary | ICD-10-CM | POA: Diagnosis present

## 2016-12-04 DIAGNOSIS — B370 Candidal stomatitis: Secondary | ICD-10-CM | POA: Diagnosis present

## 2016-12-04 DIAGNOSIS — D61818 Other pancytopenia: Secondary | ICD-10-CM

## 2016-12-04 DIAGNOSIS — I482 Chronic atrial fibrillation: Secondary | ICD-10-CM

## 2016-12-04 DIAGNOSIS — C9000 Multiple myeloma not having achieved remission: Secondary | ICD-10-CM

## 2016-12-04 LAB — RENAL FUNCTION PANEL
ALBUMIN: 2.7 g/dL — AB (ref 3.5–5.0)
Anion gap: 12 (ref 5–15)
BUN: 42 mg/dL — AB (ref 6–20)
CALCIUM: 7.2 mg/dL — AB (ref 8.9–10.3)
CO2: 26 mmol/L (ref 22–32)
Chloride: 97 mmol/L — ABNORMAL LOW (ref 101–111)
Creatinine, Ser: 8.97 mg/dL — ABNORMAL HIGH (ref 0.61–1.24)
GFR calc Af Amer: 6 mL/min — ABNORMAL LOW (ref >60)
GFR, EST NON AFRICAN AMERICAN: 5 mL/min — AB (ref >60)
Glucose, Bld: 76 mg/dL (ref 65–99)
PHOSPHORUS: 6.4 mg/dL — AB (ref 2.5–4.6)
POTASSIUM: 2.9 mmol/L — AB (ref 3.5–5.1)
SODIUM: 135 mmol/L (ref 135–145)

## 2016-12-04 LAB — CBC
HEMATOCRIT: 26.9 % — AB (ref 39.0–52.0)
HEMOGLOBIN: 8.9 g/dL — AB (ref 13.0–17.0)
MCH: 26.3 pg (ref 26.0–34.0)
MCHC: 33.1 g/dL (ref 30.0–36.0)
MCV: 79.4 fL (ref 78.0–100.0)
Platelets: 16 10*3/uL — CL (ref 150–400)
RBC: 3.39 MIL/uL — ABNORMAL LOW (ref 4.22–5.81)
RDW: 14.6 % (ref 11.5–15.5)
WBC: 3.7 10*3/uL — AB (ref 4.0–10.5)

## 2016-12-04 LAB — TYPE AND SCREEN
ABO/RH(D): O POS
Antibody Screen: NEGATIVE

## 2016-12-04 MED ORDER — SODIUM CHLORIDE 0.9 % IV SOLN
Freq: Once | INTRAVENOUS | Status: AC
  Administered 2016-12-04: 18:00:00 via INTRAVENOUS

## 2016-12-04 MED ORDER — MIDODRINE HCL 5 MG PO TABS
10.0000 mg | ORAL_TABLET | Freq: Three times a day (TID) | ORAL | Status: DC
  Administered 2016-12-04 – 2016-12-06 (×7): 10 mg via ORAL
  Filled 2016-12-04 (×6): qty 2

## 2016-12-04 MED ORDER — PENTAFLUOROPROP-TETRAFLUOROETH EX AERO
1.0000 | INHALATION_SPRAY | CUTANEOUS | Status: DC | PRN
Start: 2016-12-04 — End: 2016-12-06

## 2016-12-04 MED ORDER — HEPARIN SODIUM (PORCINE) 1000 UNIT/ML DIALYSIS
1000.0000 [IU] | INTRAMUSCULAR | Status: DC | PRN
  Filled 2016-12-04: qty 1

## 2016-12-04 MED ORDER — ALTEPLASE 2 MG IJ SOLR
2.0000 mg | Freq: Once | INTRAMUSCULAR | Status: DC | PRN
  Filled 2016-12-04: qty 2

## 2016-12-04 MED ORDER — MAGIC MOUTHWASH
10.0000 mL | Freq: Three times a day (TID) | ORAL | Status: DC
  Administered 2016-12-04 – 2016-12-06 (×5): 10 mL via ORAL
  Filled 2016-12-04 (×5): qty 10

## 2016-12-04 MED ORDER — LIDOCAINE VISCOUS 2 % MT SOLN
10.0000 mL | Freq: Three times a day (TID) | OROMUCOSAL | Status: DC
  Administered 2016-12-04 – 2016-12-06 (×5): 10 mL via OROMUCOSAL
  Filled 2016-12-04 (×5): qty 15

## 2016-12-04 MED ORDER — MAGIC MOUTHWASH W/LIDOCAINE: 10.0000 mL | Freq: Three times a day (TID) | ORAL | Status: DC

## 2016-12-04 MED ORDER — SODIUM CHLORIDE 0.9 % IV SOLN: 100.0000 mL | INTRAVENOUS | Status: DC | PRN

## 2016-12-04 MED ORDER — GUAIFENESIN ER 600 MG PO TB12
1200.0000 mg | ORAL_TABLET | Freq: Two times a day (BID) | ORAL | Status: DC
  Administered 2016-12-04 – 2016-12-06 (×4): 1200 mg via ORAL
  Filled 2016-12-04 (×4): qty 2

## 2016-12-04 MED ORDER — SODIUM CHLORIDE 0.9 % IV BOLUS (SEPSIS)
250.0000 mL | Freq: Once | INTRAVENOUS | Status: AC
  Administered 2016-12-04: 250 mL via INTRAVENOUS

## 2016-12-04 MED ORDER — MIDODRINE HCL 5 MG PO TABS
10.0000 mg | ORAL_TABLET | Freq: Three times a day (TID) | ORAL | Status: DC
  Filled 2016-12-04: qty 2

## 2016-12-04 MED ORDER — LIDOCAINE-PRILOCAINE 2.5-2.5 % EX CREA: 1.0000 "application " | TOPICAL_CREAM | CUTANEOUS | Status: DC | PRN

## 2016-12-04 MED ORDER — POTASSIUM CHLORIDE CRYS ER 20 MEQ PO TBCR: 40.0000 meq | EXTENDED_RELEASE_TABLET | Freq: Once | ORAL | Status: DC

## 2016-12-04 MED ORDER — LIDOCAINE HCL (PF) 1 % IJ SOLN: 5.0000 mL | INTRAMUSCULAR | Status: DC | PRN

## 2016-12-04 MED ORDER — HYDROCODONE-ACETAMINOPHEN 5-325 MG PO TABS: 1.0000 | ORAL_TABLET | ORAL | Status: DC | PRN

## 2016-12-04 NOTE — Progress Notes (Signed)
Patient had low blood pressure, 70/40 manually.  Paged mid-level to notify of patient blood pressure.  Received order for 250cc bolus.  Will recheck blood pressure and will continue to monitor patient.

## 2016-12-04 NOTE — Progress Notes (Signed)
Brian Liu  MRN: [id number]  DOB/AGE: 65-Jan-1952 65 y.o.  Primary Care Physician:Dhruv Barbette Reichmann, MD  Admit date: 12/02/2016  Chief Complaint:  Chief Complaint  Patient presents with  . Abdominal Pain    S-Pt presented on  12/02/2016 with  Chief Complaint  Patient presents with  . Abdominal Pain  .    Pt today feels better  Meds . brinzolamide  1 drop Both Eyes BID   And  . brimonidine  1 drop Both Eyes BID  . cinacalcet  30 mg Oral Q breakfast  . cycloSPORINE  1 drop Both Eyes BID  . Difluprednate  1 drop Right Eye 6 X Daily  . folic acid  1 mg Oral Daily  . ketorolac  1 drop Both Eyes Daily  . lanthanum  1,000 mg Oral TID WC  . latanoprost  1 drop Both Eyes QHS  . levothyroxine  137 mcg Oral QAC breakfast  . multivitamin with minerals  1 tablet Oral Daily  . pantoprazole (PROTONIX) IV  40 mg Intravenous Q12H  . saccharomyces boulardii  250 mg Oral BID  . simvastatin  20 mg Oral Q supper      Physical Exam: Vital signs in last 24 hours: Temp:  [98.3 F (36.8 C)-99.6 F (37.6 C)] 99.6 F (37.6 C) (05/02 0533) Pulse Rate:  [67-82] 72 (05/02 0800) Resp:  [16-20] 20 (05/02 0800) BP: (55-141)/(36-114) 141/114 (05/02 0800) SpO2:  [96 %-99 %] 97 % (05/02 0533) Weight:  [147 lb 11.2 oz (67 kg)] 147 lb 11.2 oz (67 kg) (05/02 0609) Weight change: 2 lb 11.2 oz (1.225 kg) Last BM Date: 12/31/16  Intake/Output from previous day: 05/01 0701 - 05/02 0700 In: 660 [P.O.:360; IV Piggyback:300] Out: -  No intake/output data recorded.   Physical Exam: General- pt is awake,alert, oriented to time place and person Resp- No acute REsp distress,  Rhonchi+ CVS- S1S2 regular ij rate and rhythm GIT- BS+, soft, NT, ND EXT- NO LE Edema, Cyanosis Access- AVF +   Lab Results: CBC  Recent Labs  12/03/16 0514 12/04/16 0512  WBC 5.1 3.7*  HGB 11.5* 8.9*  HCT 35.2* 26.9*  PLT 51* 16*    BMET  Recent Labs  12/03/16 0514 12/04/16 0512  NA 140 135  K 3.4* 2.9*   CL 98* 97*  CO2 27 26  GLUCOSE 74 76  BUN 32* 42*  CREATININE 7.45* 8.97*  CALCIUM 8.2* 7.2*    MICRO Recent Results (from the past 240 hour(s))  Culture, blood (Routine x 2)     Status: None (Preliminary result)   Collection Time: 12/02/16 11:32 AM  Result Value Ref Range Status   Specimen Description BLOOD RIGHT ARM  Final   Special Requests   Final    BOTTLES DRAWN AEROBIC AND ANAEROBIC Blood Culture results may not be optimal due to an inadequate volume of blood received in culture bottles   Culture NO GROWTH 2 DAYS  Final   Report Status PENDING  Incomplete  Culture, blood (Routine x 2)     Status: None (Preliminary result)   Collection Time: 12/02/16 11:40 AM  Result Value Ref Range Status   Specimen Description BLOOD RIGHT HAND  Final   Special Requests   Final    BOTTLES DRAWN AEROBIC AND ANAEROBIC Blood Culture results may not be optimal due to an inadequate volume of blood received in culture bottles   Culture NO GROWTH 2 DAYS  Final   Report Status PENDING  Incomplete  MRSA PCR Screening     Status: None   Collection Time: 12/02/16  6:20 PM  Result Value Ref Range Status   MRSA by PCR NEGATIVE NEGATIVE Final    Comment:        The GeneXpert MRSA Assay (FDA approved for NASAL specimens only), is one component of a comprehensive MRSA colonization surveillance program. It is not intended to diagnose MRSA infection nor to guide or monitor treatment for MRSA infections.       Lab Results  Component Value Date   CALCIUM 7.2 (L) 12/04/2016   PHOS 6.4 (H) 12/04/2016   Albumin 2.7 corrected calcium  8.2            Impression: 1)Renal  ESRD on Hd                Hx of failed transplant                Pt is on Monday/Wednesday/Friday schedule                Will dialyze today  2)CVS- Hypotension    Not on pressors yet    Will start Midodrine  3)Anemia In ESRD the goal for HGb is 9--11 No need of epo   4)CKD Mineral-Bone Disorder Secondary  Hyperparathyroidism  present.    On sensipar Phosphorus not at goal     On binder Calcium is below the goal.   HD should help  5)oncology-hx of Multiple Myeloma On chemo as outpt Primary MD following  6)Electrolytes  hypokalemic  NOrmonatremic   7)Acid base Co2 at goal     Plan:  Will use 3k/4k bath bath as pt potassium is on lower side Wil recheck calcium and   k in am Will keep on epo      [name] S 12/04/2016, 10:01 AM

## 2016-12-04 NOTE — Progress Notes (Signed)
Dr Marin Comment made aware of hypotension. No new orders received. Will continue to monitor.

## 2016-12-04 NOTE — Progress Notes (Signed)
PROGRESS NOTE    Brian Liu  HMC:[id number] DOB: Jun 19, 1951 DOA: 12/02/2016 PCP: [name], MD    Brief Narrative:  66 year old male with history of multiple myeloma on chemotherapy, end-stage renal disease, chronic atrial fibrillation, presents to the hospital with complaints of abdominal pain, nausea and vomiting. CT scan of the abdomen and pelvis indicated possible enteritis. He was admitted for further management.   Assessment & Plan:   Active Problems:   Hypertension   Pancytopenia ([place])   ESRD (end stage renal disease) ([place])   Atrial fibrillation (HCC)   Multiple myeloma (HCC)   Pancreatitis   Hypokalemia   Anemia   Abdominal pain   Enteritis   Thrush   1. Enteritis. Patient initially admitted with abdominal pain, nausea and vomiting. CT scan indicated underlying enteritis. He was started on ciprofloxacin and Flagyl. Overall his symptoms have improved. Initially started on clear liquids and appears to be tolerating this. Will advance to soft foods.  2. . Pancytopenia. Related to recent chemotherapy for myeloma. Platelet count is 16,000, but no evidence of bleeding. Discussed with oncology who recommended transfusing 1 unit of irradiated platelets.  3. End-stage renal disease on hemodialysis. Nephrology following.  4. Multiple myeloma. Follow-up with oncology after discharge.  5. Chronic atrial fibrillation. Heart rate appears to be controlled. Not on anticoagulation due to history of GI bleeding.  6. Hypokalemia. Replace  7. Chronic hypotension. Likely related to autonomic dysfunction. He was taking midodrine as an outpatient that was not continued on admission. This will be restarted.  8. Oral thrush. Start on Magic mouthwash.   DVT prophylaxis: scd Code Status: full  Family Communication: no family present Disposition Plan: discharge home once improved   Consultants:   Oncology (phone)  Nephrology  Procedures:     Antimicrobials:   cipro  5/1>>  Flagyl 5/1>>    Subjective: Abdominal pain is better. Tolerating clear liquids  Objective: Vitals:   12/04/16 1430 12/04/16 1500 12/04/16 1530 12/04/16 1600  BP: (!) 83/51 (!) 79/43 (!) 77/43 (!) 88/46  Pulse: 90 89 88 94  Resp:      Temp:      TempSrc:      SpO2:      Weight:      Height:        Intake/Output Summary (Last 24 hours) at 12/04/16 1642 Last data filed at 12/04/16 1303  Gross per 24 hour  Intake             1200 ml  Output                0 ml  Net             1200 ml   Filed Weights   12/02/16 1747 12/04/16 0609 12/04/16 1315  Weight: 63.7 kg (140 lb 8 oz) 67 kg (147 lb 11.2 oz) 66.9 kg (147 lb 7.8 oz)    Examination:  General exam: Appears calm and comfortable  Respiratory system: bilateral rhonchi. Respiratory effort normal. Cardiovascular system: S1 & S2 heard, RRR. No JVD, murmurs, rubs, gallops or clicks. No pedal edema. Gastrointestinal system: Abdomen is nondistended, soft and nontender. No organomegaly or masses felt. Normal bowel sounds heard. Central nervous system: Alert and oriented. No focal neurological deficits. Extremities: Symmetric 5 x 5 power. Skin: No rashes, lesions or ulcers Psychiatry: Judgement and insight appear normal. Mood & affect appropriate.     Data Reviewed: I have personally reviewed following labs and imaging studies  CBC:  Recent Labs Lab 12/02/16 1127 12/03/16 0514 12/04/16 0512  WBC 5.1 5.1 3.7*  NEUTROABS 4.0  --   --   HGB 12.2* 11.5* 8.9*  HCT 37.0* 35.2* 26.9*  MCV 78.9 79.6 79.4  PLT 74* 51* 16*   Basic Metabolic Panel:  Recent Labs Lab 12/02/16 1127 12/03/16 0514 12/04/16 0512  NA 139 140 135  K 3.4* 3.4* 2.9*  CL 97* 98* 97*  CO2 '26 27 26  ' GLUCOSE 76 74 76  BUN 24* 32* 42*  CREATININE 6.10* 7.45* 8.97*  CALCIUM 8.4* 8.2* 7.2*  PHOS  --   --  6.4*   GFR: Estimated Creatinine Clearance: 7.9 mL/min (A) (by C-G formula based on SCr of 8.97 mg/dL (H)). Liver Function  Tests:  Recent Labs Lab 12/02/16 1127 12/03/16 0514 12/04/16 0512  AST 28 14*  --   ALT 21 15*  --   ALKPHOS 57 56  --   BILITOT 0.7 0.7  --   PROT 7.8 6.8  --   ALBUMIN 3.9 3.4* 2.7*    Recent Labs Lab 12/02/16 1132  LIPASE 66*   No results for input(s): AMMONIA in the last 168 hours. Coagulation Profile:  Recent Labs Lab 12/02/16 1127  INR 1.08   Cardiac Enzymes: No results for input(s): CKTOTAL, CKMB, CKMBINDEX, TROPONINI in the last 168 hours. BNP (last 3 results) No results for input(s): PROBNP in the last 8760 hours. HbA1C: No results for input(s): HGBA1C in the last 72 hours. CBG: No results for input(s): GLUCAP in the last 168 hours. Lipid Profile: No results for input(s): CHOL, HDL, LDLCALC, TRIG, CHOLHDL, LDLDIRECT in the last 72 hours. Thyroid Function Tests: No results for input(s): TSH, T4TOTAL, FREET4, T3FREE, THYROIDAB in the last 72 hours. Anemia Panel: No results for input(s): VITAMINB12, FOLATE, FERRITIN, TIBC, IRON, RETICCTPCT in the last 72 hours. Sepsis Labs:  Recent Labs Lab 12/02/16 1140  LATICACIDVEN 1.58    Recent Results (from the past 240 hour(s))  Culture, blood (Routine x 2)     Status: None (Preliminary result)   Collection Time: 12/02/16 11:32 AM  Result Value Ref Range Status   Specimen Description BLOOD RIGHT ARM  Final   Special Requests   Final    BOTTLES DRAWN AEROBIC AND ANAEROBIC Blood Culture results may not be optimal due to an inadequate volume of blood received in culture bottles   Culture NO GROWTH 2 DAYS  Final   Report Status PENDING  Incomplete  Culture, blood (Routine x 2)     Status: None (Preliminary result)   Collection Time: 12/02/16 11:40 AM  Result Value Ref Range Status   Specimen Description BLOOD RIGHT HAND  Final   Special Requests   Final    BOTTLES DRAWN AEROBIC AND ANAEROBIC Blood Culture results may not be optimal due to an inadequate volume of blood received in culture bottles   Culture NO  GROWTH 2 DAYS  Final   Report Status PENDING  Incomplete  MRSA PCR Screening     Status: None   Collection Time: 12/02/16  6:20 PM  Result Value Ref Range Status   MRSA by PCR NEGATIVE NEGATIVE Final    Comment:        The GeneXpert MRSA Assay (FDA approved for NASAL specimens only), is one component of a comprehensive MRSA colonization surveillance program. It is not intended to diagnose MRSA infection nor to guide or monitor treatment for MRSA infections.          Radiology  Studies: No results found.      Scheduled Meds: . brinzolamide  1 drop Both Eyes BID   And  . brimonidine  1 drop Both Eyes BID  . cinacalcet  30 mg Oral Q breakfast  . cycloSPORINE  1 drop Both Eyes BID  . Difluprednate  1 drop Right Eye 6 X Daily  . folic acid  1 mg Oral Daily  . guaiFENesin  1,200 mg Oral BID  . ketorolac  1 drop Both Eyes Daily  . lanthanum  1,000 mg Oral TID WC  . latanoprost  1 drop Both Eyes QHS  . levothyroxine  137 mcg Oral QAC breakfast  . magic mouthwash  10 mL Oral TID   And  . lidocaine  10 mL Mouth/Throat TID  . midodrine  10 mg Oral TID WC  . multivitamin with minerals  1 tablet Oral Daily  . pantoprazole (PROTONIX) IV  40 mg Intravenous Q12H  . potassium chloride  40 mEq Oral Once  . saccharomyces boulardii  250 mg Oral BID  . simvastatin  20 mg Oral Q supper   Continuous Infusions: . sodium chloride    . sodium chloride    . sodium chloride    . ciprofloxacin Stopped (12/04/16 1303)  . metronidazole Stopped (12/04/16 1117)     LOS: 1 day    Time spent: 71mns    MEMON,JEHANZEB, MD [facility] Pager [phone] If 7PM-7AM, please contact night-coverage [email or web address] Password [facility]5/09/2016, 4:42 PM

## 2016-12-04 NOTE — Telephone Encounter (Signed)
Called son to let him know that we would be cancelling the clinic for May but I would keep [place] in mind for the clinic in June.

## 2016-12-04 NOTE — Procedures (Signed)
   HEMODIALYSIS TREATMENT NOTE:  3.5 hour heparin-free dialysis completed via left forearm AVF (16g/antegrade). Goal NOT met: pre-HD BP 65/32 one hour after Midodrine '10mg'$  was given. Pt was asymptomatic and stated, "I't always low....they can't ever pull off any fluid (at outpatient dialysis clinic). They just clean my blood."  Net UF 268cc. All blood was returned and hemostasis was achieved within 20 minutes. Report given to Tedd Sias, RN.  Rockwell Alexandria, RN, CDN

## 2016-12-05 ENCOUNTER — Other Ambulatory Visit (HOSPITAL_COMMUNITY): Payer: Self-pay | Admitting: Oncology

## 2016-12-05 ENCOUNTER — Inpatient Hospital Stay (HOSPITAL_COMMUNITY): Payer: Medicare Other

## 2016-12-05 DIAGNOSIS — R509 Fever, unspecified: Secondary | ICD-10-CM

## 2016-12-05 LAB — PREPARE PLATELET PHERESIS: Unit division: 0

## 2016-12-05 LAB — BPAM PLATELET PHERESIS
Blood Product Expiration Date: 201805042359
ISSUE DATE / TIME: 201805021814
Unit Type and Rh: 7300

## 2016-12-05 LAB — CBC WITH DIFFERENTIAL/PLATELET
BASOS ABS: 0 10*3/uL (ref 0.0–0.1)
BASOS PCT: 0 %
EOS ABS: 0 10*3/uL (ref 0.0–0.7)
EOS PCT: 1 %
HCT: 28 % — ABNORMAL LOW (ref 39.0–52.0)
Hemoglobin: 9.1 g/dL — ABNORMAL LOW (ref 13.0–17.0)
Lymphocytes Relative: 13 %
Lymphs Abs: 0.6 10*3/uL — ABNORMAL LOW (ref 0.7–4.0)
MCH: 25.7 pg — ABNORMAL LOW (ref 26.0–34.0)
MCHC: 32.5 g/dL (ref 30.0–36.0)
MCV: 79.1 fL (ref 78.0–100.0)
MONO ABS: 0.5 10*3/uL (ref 0.1–1.0)
Monocytes Relative: 11 %
Neutro Abs: 3.5 10*3/uL (ref 1.7–7.7)
Neutrophils Relative %: 75 %
PLATELETS: 24 10*3/uL — AB (ref 150–400)
RBC: 3.54 MIL/uL — ABNORMAL LOW (ref 4.22–5.81)
RDW: 14.8 % (ref 11.5–15.5)
WBC: 4.6 10*3/uL (ref 4.0–10.5)

## 2016-12-05 LAB — BASIC METABOLIC PANEL
ANION GAP: 10 (ref 5–15)
BUN: 26 mg/dL — ABNORMAL HIGH (ref 6–20)
CALCIUM: 7.6 mg/dL — AB (ref 8.9–10.3)
CO2: 27 mmol/L (ref 22–32)
Chloride: 96 mmol/L — ABNORMAL LOW (ref 101–111)
Creatinine, Ser: 5.87 mg/dL — ABNORMAL HIGH (ref 0.61–1.24)
GFR, EST AFRICAN AMERICAN: 11 mL/min — AB (ref >60)
GFR, EST NON AFRICAN AMERICAN: 9 mL/min — AB (ref >60)
GLUCOSE: 82 mg/dL (ref 65–99)
Potassium: 3.3 mmol/L — ABNORMAL LOW (ref 3.5–5.1)
SODIUM: 133 mmol/L — AB (ref 135–145)

## 2016-12-05 LAB — HEPATITIS B SURFACE ANTIGEN: Hepatitis B Surface Ag: NEGATIVE

## 2016-12-05 MED ORDER — POTASSIUM CHLORIDE CRYS ER 20 MEQ PO TBCR
40.0000 meq | EXTENDED_RELEASE_TABLET | Freq: Once | ORAL | Status: AC
  Administered 2016-12-05: 40 meq via ORAL
  Filled 2016-12-05: qty 2

## 2016-12-05 MED ORDER — SODIUM CHLORIDE 0.9 % IV SOLN: Freq: Once | INTRAVENOUS | Status: DC

## 2016-12-05 NOTE — Progress Notes (Signed)
PROGRESS NOTE    Brian Liu  ZCH:[id number] DOB: [birth year] DOA: 12/02/2016 PCP: [name], MD    Brief Narrative:  66 year old male with history of multiple myeloma on chemotherapy, end-stage renal disease, chronic atrial fibrillation, presents to the hospital with complaints of abdominal pain, nausea and vomiting. CT scan of the abdomen and pelvis indicated possible enteritis. He was admitted for further management.   Assessment & Plan:   Active Problems:   Hypertension   Pancytopenia ([place])   ESRD (end stage renal disease) ([place])   Atrial fibrillation (HCC)   Multiple myeloma (HCC)   Pancreatitis   Hypokalemia   Anemia   Abdominal pain   Enteritis   Thrush   1. Enteritis. Patient initially admitted with abdominal pain, nausea and vomiting. CT scan indicated underlying enteritis. He was started on ciprofloxacin and Flagyl. Overall his symptoms have improved. Diet has been advanced to solid food and he appears to be tolerating this.  2. . Pancytopenia. Related to recent chemotherapy for myeloma. Platelet count is 24,000 after 1 unit of platelets. No evidence of bleeding. Continue to monitor.  3. End-stage renal disease on hemodialysis. Nephrology following.  4. Multiple myeloma. Follow-up with oncology after discharge.  5. Chronic atrial fibrillation. Heart rate appears to be controlled. Not on anticoagulation due to history of GI bleeding.  6. Hypokalemia. Replace  7. Chronic hypotension. Likely related to autonomic dysfunction. He was taking midodrine as an outpatient that was not continued on admission. This has been restarted. Current blood pressure readings are likely inaccurate. Patient is asymptomatic.  8. Oral thrush. Start on Magic mouthwash.  9. Fever. Unclear etiology. Since he has productive cough, will check chest xray. Also check blood cultures and urinalysis.   DVT prophylaxis: scd Code Status: full  Family Communication: no family  present Disposition Plan: discharge home once improved   Consultants:   Oncology (phone)  Nephrology  Procedures:     Antimicrobials:   cipro 5/1>>  Flagyl 5/1>>    Subjective: Tolerating solid foods. No abdominal pain. Had fever overnight. Continues to have productive cough  Objective: Vitals:   12/05/16 0545 12/05/16 0700 12/05/16 1020 12/05/16 1300  BP: (!) 66/43 (!) 80/40  (!) 61/49  Pulse: 75   77  Resp: 15   16  Temp: 99.3 F (37.4 C)   98 F (36.7 C)  TempSrc: Oral   Oral  SpO2: 92%  91% 98%  Weight: 69.3 kg (152 lb 12.8 oz)     Height:        Intake/Output Summary (Last 24 hours) at 12/05/16 1555 Last data filed at 12/05/16 1200  Gross per 24 hour  Intake             1030 ml  Output              268 ml  Net              762 ml   Filed Weights   12/04/16 0609 12/04/16 1315 12/05/16 0545  Weight: 67 kg (147 lb 11.2 oz) 66.9 kg (147 lb 7.8 oz) 69.3 kg (152 lb 12.8 oz)    Examination:  General exam: Appears calm and comfortable  Respiratory system: bilateral rhonchi. Respiratory effort normal. Cardiovascular system: S1 & S2 heard, RRR. No JVD, murmurs, rubs, gallops or clicks. No pedal edema. Gastrointestinal system: Abdomen is nondistended, soft and nontender. No organomegaly or masses felt. Normal bowel sounds heard. Central nervous system: Alert and oriented. No focal neurological  deficits. Extremities: Symmetric 5 x 5 power. Skin: No rashes, lesions or ulcers Psychiatry: Judgement and insight appear normal. Mood & affect appropriate.     Data Reviewed: I have personally reviewed following labs and imaging studies  CBC:  Recent Labs Lab 12/02/16 1127 12/03/16 0514 12/04/16 0512 12/05/16 0618  WBC 5.1 5.1 3.7* 4.6  NEUTROABS 4.0  --   --  3.5  HGB 12.2* 11.5* 8.9* 9.1*  HCT 37.0* 35.2* 26.9* 28.0*  MCV 78.9 79.6 79.4 79.1  PLT 74* 51* 16* 24*   Basic Metabolic Panel:  Recent Labs Lab 12/02/16 1127 12/03/16 0514 12/04/16 0512  12/05/16 0618  NA 139 140 135 133*  K 3.4* 3.4* 2.9* 3.3*  CL 97* 98* 97* 96*  CO2 '26 27 26 27  ' GLUCOSE 76 74 76 82  BUN 24* 32* 42* 26*  CREATININE 6.10* 7.45* 8.97* 5.87*  CALCIUM 8.4* 8.2* 7.2* 7.6*  PHOS  --   --  6.4*  --    GFR: Estimated Creatinine Clearance: 12.5 mL/min (A) (by C-G formula based on SCr of 5.87 mg/dL (H)). Liver Function Tests:  Recent Labs Lab 12/02/16 1127 12/03/16 0514 12/04/16 0512  AST 28 14*  --   ALT 21 15*  --   ALKPHOS 57 56  --   BILITOT 0.7 0.7  --   PROT 7.8 6.8  --   ALBUMIN 3.9 3.4* 2.7*    Recent Labs Lab 12/02/16 1132  LIPASE 66*   No results for input(s): AMMONIA in the last 168 hours. Coagulation Profile:  Recent Labs Lab 12/02/16 1127  INR 1.08   Cardiac Enzymes: No results for input(s): CKTOTAL, CKMB, CKMBINDEX, TROPONINI in the last 168 hours. BNP (last 3 results) No results for input(s): PROBNP in the last 8760 hours. HbA1C: No results for input(s): HGBA1C in the last 72 hours. CBG: No results for input(s): GLUCAP in the last 168 hours. Lipid Profile: No results for input(s): CHOL, HDL, LDLCALC, TRIG, CHOLHDL, LDLDIRECT in the last 72 hours. Thyroid Function Tests: No results for input(s): TSH, T4TOTAL, FREET4, T3FREE, THYROIDAB in the last 72 hours. Anemia Panel: No results for input(s): VITAMINB12, FOLATE, FERRITIN, TIBC, IRON, RETICCTPCT in the last 72 hours. Sepsis Labs:  Recent Labs Lab 12/02/16 1140  LATICACIDVEN 1.58    Recent Results (from the past 240 hour(s))  Culture, blood (Routine x 2)     Status: None (Preliminary result)   Collection Time: 12/02/16 11:32 AM  Result Value Ref Range Status   Specimen Description BLOOD RIGHT ARM  Final   Special Requests   Final    BOTTLES DRAWN AEROBIC AND ANAEROBIC Blood Culture results may not be optimal due to an inadequate volume of blood received in culture bottles   Culture NO GROWTH 3 DAYS  Final   Report Status PENDING  Incomplete  Culture,  blood (Routine x 2)     Status: None (Preliminary result)   Collection Time: 12/02/16 11:40 AM  Result Value Ref Range Status   Specimen Description BLOOD RIGHT HAND  Final   Special Requests   Final    BOTTLES DRAWN AEROBIC AND ANAEROBIC Blood Culture results may not be optimal due to an inadequate volume of blood received in culture bottles   Culture NO GROWTH 3 DAYS  Final   Report Status PENDING  Incomplete  MRSA PCR Screening     Status: None   Collection Time: 12/02/16  6:20 PM  Result Value Ref Range Status   MRSA by  PCR NEGATIVE NEGATIVE Final    Comment:        The GeneXpert MRSA Assay (FDA approved for NASAL specimens only), is one component of a comprehensive MRSA colonization surveillance program. It is not intended to diagnose MRSA infection nor to guide or monitor treatment for MRSA infections.          Radiology Studies: No results found.      Scheduled Meds: . brinzolamide  1 drop Both Eyes BID   And  . brimonidine  1 drop Both Eyes BID  . cinacalcet  30 mg Oral Q breakfast  . cycloSPORINE  1 drop Both Eyes BID  . Difluprednate  1 drop Right Eye 6 X Daily  . folic acid  1 mg Oral Daily  . guaiFENesin  1,200 mg Oral BID  . ketorolac  1 drop Both Eyes Daily  . lanthanum  1,000 mg Oral TID WC  . latanoprost  1 drop Both Eyes QHS  . levothyroxine  137 mcg Oral QAC breakfast  . magic mouthwash  10 mL Oral TID   And  . lidocaine  10 mL Mouth/Throat TID  . midodrine  10 mg Oral TID WC  . multivitamin with minerals  1 tablet Oral Daily  . pantoprazole (PROTONIX) IV  40 mg Intravenous Q12H  . potassium chloride  40 mEq Oral Once  . saccharomyces boulardii  250 mg Oral BID  . simvastatin  20 mg Oral Q supper   Continuous Infusions: . sodium chloride    . sodium chloride    . ciprofloxacin Stopped (12/05/16 1157)  . metronidazole Stopped (12/05/16 0941)     LOS: 2 days    Time spent: 30mns    Compton Brigance, MD [facility] Pager  [phone] If 7PM-7AM, please contact night-coverage [email or web address] Password [email or web address] 12/05/2016, 3:55 PM

## 2016-12-05 NOTE — Progress Notes (Signed)
Patient been stable during shift.  BP low, but otherwise stable. No complaints of pain or issues during night.

## 2016-12-05 NOTE — Progress Notes (Signed)
CRITICAL VALUE ALERT  Critical value received:  Platelets 24  Date of notification: 12/05/2016  Time of notification:  0718  Critical value read back:Yes  Nurse who received alert:  Vista Deck  MD notified (1st page):  Dr. Roderic Palau  Time of first page: 0720

## 2016-12-05 NOTE — Progress Notes (Addendum)
Subjective: Interval History: has complaints cough with some whitish sputum production. He denies any difficulty breathing. Patient also denies any nausea or vomiting..  Objective: Vital signs in last 24 hours: Temp:  [98.6 F (37 C)-101 F (38.3 C)] 99.3 F (37.4 C) (05/03 0545) Pulse Rate:  [74-115] 75 (05/03 0545) Resp:  [15-18] 15 (05/03 0545) BP: (61-120)/(32-90) 80/40 (05/03 0700) SpO2:  [92 %-100 %] 92 % (05/03 0545) Weight:  [66.9 kg (147 lb 7.8 oz)-69.3 kg (152 lb 12.8 oz)] 69.3 kg (152 lb 12.8 oz) (05/03 0545) Weight change: -0.096 kg (-3.4 oz)  Intake/Output from previous day: 05/02 0701 - 05/03 0700 In: 1090 [P.O.:360; I.V.:250; Blood:180; IV Piggyback:300] Out: 268  Intake/Output this shift: No intake/output data recorded.  General appearance: alert, cooperative and no distress Resp: clear to auscultation bilaterally Cardio: regular rate and rhythm Extremities: No edema  Lab Results:  Recent Labs  12/04/16 0512 12/05/16 0618  WBC 3.7* 4.6  HGB 8.9* 9.1*  HCT 26.9* 28.0*  PLT 16* 24*   BMET:  Recent Labs  12/03/16 0514 12/04/16 0512  NA 140 135  K 3.4* 2.9*  CL 98* 97*  CO2 27 26  GLUCOSE 74 76  BUN 32* 42*  CREATININE 7.45* 8.97*  CALCIUM 8.2* 7.2*   No results for input(s): PTH in the last 72 hours. Iron Studies: No results for input(s): IRON, TIBC, TRANSFERRIN, FERRITIN in the last 72 hours.  Studies/Results: No results found.  I have reviewed the patient's current medications.  Assessment/Plan: Problem #1 end-stage renal disease: He is status post hemodialysis yesterday. Presently he denies any nausea or vomiting. Problem #2 anemia: His hemoglobin is range Problem #3 abdominal pain, nausea vomiting. Presently patient is feeling much better. Problem #4 metabolic bone disease: His calcium is range but phosphorus is high. Presently is on a binder. Problem #5 history of cough Problem #6 history of multiple myeloma and possible  amyloidosis. Presently patient on chemotherapy. Problem #7 pancytopenia: Today his white blood cell and platelets shows some improvement Problem #8 hypotension: He is on midodrine. His blood pressure generally low. Plan: 1] We'll make arrangements for patient to get dialysis tomorrow 2] We'll check his renal panel and CBC in the morning 3] We'll use 3 K/2.5 calcium bath. 4] Will remove one and half liters tomorrow if his blood pressure tolerates    LOS: 2 days   Brian Liu S 12/05/2016,10:25 AM

## 2016-12-06 ENCOUNTER — Inpatient Hospital Stay (HOSPITAL_BASED_OUTPATIENT_CLINIC_OR_DEPARTMENT_OTHER): Payer: Medicare Other

## 2016-12-06 DIAGNOSIS — C9000 Multiple myeloma not having achieved remission: Secondary | ICD-10-CM

## 2016-12-06 DIAGNOSIS — Z5112 Encounter for antineoplastic immunotherapy: Secondary | ICD-10-CM

## 2016-12-06 LAB — RENAL FUNCTION PANEL
ALBUMIN: 2.9 g/dL — AB (ref 3.5–5.0)
ANION GAP: 12 (ref 5–15)
BUN: 38 mg/dL — AB (ref 6–20)
CHLORIDE: 95 mmol/L — AB (ref 101–111)
CO2: 26 mmol/L (ref 22–32)
CREATININE: 7.57 mg/dL — AB (ref 0.61–1.24)
Calcium: 7.4 mg/dL — ABNORMAL LOW (ref 8.9–10.3)
GFR, EST AFRICAN AMERICAN: 8 mL/min — AB (ref >60)
GFR, EST NON AFRICAN AMERICAN: 7 mL/min — AB (ref >60)
Glucose, Bld: 87 mg/dL (ref 65–99)
POTASSIUM: 4 mmol/L (ref 3.5–5.1)
Phosphorus: 4.7 mg/dL — ABNORMAL HIGH (ref 2.5–4.6)
Sodium: 133 mmol/L — ABNORMAL LOW (ref 135–145)

## 2016-12-06 LAB — CBC
HEMATOCRIT: 27 % — AB (ref 39.0–52.0)
HEMOGLOBIN: 9 g/dL — AB (ref 13.0–17.0)
MCH: 26 pg (ref 26.0–34.0)
MCHC: 33.3 g/dL (ref 30.0–36.0)
MCV: 78 fL (ref 78.0–100.0)
Platelets: 34 10*3/uL — ABNORMAL LOW (ref 150–400)
RBC: 3.46 MIL/uL — ABNORMAL LOW (ref 4.22–5.81)
RDW: 14.9 % (ref 11.5–15.5)
WBC: 5.3 10*3/uL (ref 4.0–10.5)

## 2016-12-06 MED ORDER — HEPARIN SODIUM (PORCINE) 1000 UNIT/ML DIALYSIS
1000.0000 [IU] | INTRAMUSCULAR | Status: DC | PRN
  Filled 2016-12-06: qty 1

## 2016-12-06 MED ORDER — CIPROFLOXACIN HCL 500 MG PO TABS: 500.0000 mg | ORAL_TABLET | Freq: Every day | ORAL | 0 refills | Status: DC

## 2016-12-06 MED ORDER — BORTEZOMIB CHEMO SQ INJECTION 3.5 MG (2.5MG/ML)
1.3000 mg/m2 | Freq: Once | INTRAMUSCULAR | Status: AC
  Administered 2016-12-06: 2.5 mg via SUBCUTANEOUS
  Filled 2016-12-06: qty 2.5

## 2016-12-06 MED ORDER — GUAIFENESIN ER 600 MG PO TB12: 600.0000 mg | ORAL_TABLET | Freq: Two times a day (BID) | ORAL | 0 refills | Status: DC

## 2016-12-06 MED ORDER — ONDANSETRON HCL 4 MG PO TABS
8.0000 mg | ORAL_TABLET | Freq: Once | ORAL | Status: AC
  Administered 2016-12-06: 8 mg via ORAL

## 2016-12-06 MED ORDER — ALTEPLASE 2 MG IJ SOLR
2.0000 mg | Freq: Once | INTRAMUSCULAR | Status: DC | PRN
  Filled 2016-12-06: qty 2

## 2016-12-06 MED ORDER — SODIUM CHLORIDE 0.9 % IV SOLN: 100.0000 mL | INTRAVENOUS | Status: DC | PRN

## 2016-12-06 MED ORDER — METRONIDAZOLE 500 MG PO TABS: 500.0000 mg | ORAL_TABLET | Freq: Three times a day (TID) | ORAL | 0 refills | Status: DC

## 2016-12-06 NOTE — Progress Notes (Signed)
Pharmacy Antibiotic Note  Brian Liu is a 66 y.o. male admitted on 12/02/2016 with intra abdominal infection.  Pharmacy has been consulted for CIPRO dosing. Still has some cough but seems to be better. CT scan of the abdomen and pelvis indicated possible enteritis and overall his symptoms have improved. Diet was advanced to solid food and appears to be tolerating.  Plan: Cipro 400mg  IV q24hrs (renally adjusted) Also on Flagyl 500mg  IV  q8h Monitor labs, progress, c/s Consider transition to PO  Height: 6' (182.9 cm) Weight: 152 lb 5.4 oz (69.1 kg) IBW/kg (Calculated) : 77.6  Temp (24hrs), Avg:98.1 F (36.7 C), Min:97.7 F (36.5 C), Max:98.7 F (37.1 C)   Recent Labs Lab 12/02/16 1127 12/02/16 1140 12/03/16 0514 12/04/16 0512 12/05/16 0618 12/06/16 0541  WBC 5.1  --  5.1 3.7* 4.6 5.3  CREATININE 6.10*  --  7.45* 8.97* 5.87* 7.57*  LATICACIDVEN  --  1.58  --   --   --   --     Estimated Creatinine Clearance: 9.6 mL/min (A) (by C-G formula based on SCr of 7.57 mg/dL (H)).    No Known Allergies  Antimicrobials this admission: Cipro 5/1 >>  Flagyl 5/1 >>   Dose adjustments this admission:  Microbiology results: 5/3 BCx: ngtd 4/30 MRSA PCR: negative  Thank you for allowing pharmacy to be a part of this patient's care.  Isac Sarna, BS Pharm D, BCPS Clinical Pharmacist Pager [phone] 12/06/2016 10:40 AM

## 2016-12-06 NOTE — Care Management Important Message (Signed)
Important Message  Patient Details  Name: Brian Liu MRN: [id number] Date of Birth: 1950/11/14   Medicare Important Message Given:  Yes    Sherald Barge, RN 12/06/2016, 9:24 AM

## 2016-12-06 NOTE — Progress Notes (Signed)
Spoke to lab about patient platelets.  Platlets not available at this time, platelets have to be brought in by courier.  Lab will call when platelets are ready.

## 2016-12-06 NOTE — Discharge Summary (Signed)
Physician Discharge Summary  Brian Liu QMV:[id number] DOB: [birth year] DOA: 12/02/2016  PCP: [name], MD  Admit date: 12/02/2016 Discharge date: 12/06/2016  Admitted From: home Disposition:  home  Recommendations for Outpatient Follow-up:  1. Follow up with PCP in 1-2 weeks 2. Please obtain BMP/CBC in one week 3. Follow-up with nephrology on 5/7 for next dialysis 4. Follow-up with oncology next week as previously scheduled. Cyclophosphamide on hold until follow-up with oncology  Discharge Condition: stable CODE STATUS: full  Diet recommendation: Heart Healthy   Brief/Interim Summary: 66 year old male with history of multiple myeloma on chemotherapy, end-stage renal disease, chronic atrial fibrillation, presents to the hospital with complaints of abdominal pain, nausea and vomiting. CT scan of the abdomen and pelvis indicated possible enteritis. He was admitted for further management.  Discharge Diagnoses:  Active Problems:   Hypertension   Pancytopenia ([place])   ESRD (end stage renal disease) ([place])   Atrial fibrillation (H[facility])   Multiple myeloma (HCC)   Pancreatitis   Hypokalemia   Anemia   Abdominal pain   Enteritis   Thrush  1. Enteritis. Patient initially admitted with abdominal pain, nausea and vomiting. CT scan indicated underlying enteritis. He was started on ciprofloxacin and Flagyl. Overall his symptoms have improved. Diet has been advanced to solid food and he appears to be tolerating this. He transitioned to oral antibiotics to complete his course  2. . Pancytopenia. Related to recent chemotherapy for myeloma. Labs were reviewed with oncology who recommended platelet transfusion for thrombocytopenia. He received total of 2 units of platelets. He will follow up with oncology for further management..  3. End-stage renal disease on hemodialysis. Nephrology followed for dialysis needs  4. Multiple myeloma. Follow-up with oncology after discharge.  5. Chronic  atrial fibrillation. Heart rate appears to be controlled. Not on anticoagulation due to history of GI bleeding.  6. Hypokalemia. Replace  7. Chronic hypotension. Likely related to autonomic dysfunction. Once he was restarted on home dose of Midodrine, blood pressure improved. He is otherwise asymptomatic.  8. Oral thrush. Treated with Magic mouthwash.  9. Fever. Unclear etiology. Chest x-ray did not show any evidence of pneumonia. Blood cultures were unrevealing. Possibly related to platelet transfusion. No further fevers at the time of discharge.  Discharge Instructions  Discharge Instructions    Diet - low sodium heart healthy    Complete by:  As directed    Increase activity slowly    Complete by:  As directed      Allergies as of 12/06/2016   No Known Allergies     Medication List    STOP taking these medications   cyclophosphamide 50 MG capsule Commonly known as:  CYTOXAN     TAKE these medications   acyclovir 400 MG tablet Commonly known as:  ZOVIRAX Take 1 tablet (400 mg total) by mouth 2 (two) times daily.   cinacalcet 30 MG tablet Commonly known as:  SENSIPAR Take 30 mg by mouth.   ciprofloxacin 500 MG tablet Commonly known as:  CIPRO Take 1 tablet (500 mg total) by mouth daily with breakfast.   cycloSPORINE 0.05 % ophthalmic emulsion Commonly known as:  RESTASIS Place 1 drop into both eyes 2 (two) times daily.   DAILY VITE Tabs Take 1 tablet by mouth daily.   dexamethasone 4 MG tablet Commonly known as:  DECADRON Take 10 tablets (40 mg) on days 1, 8, and 15 of chemo. Repeat every 21 days. Take 2 tablets (67m) daily for 2 days starting  after days 1 and 8 of chemotherapy.   DUREZOL 0.05 % Emul Generic drug:  Difluprednate Place 1 drop into the right eye 6 (six) times daily.   folic acid 1 MG tablet Commonly known as:  FOLVITE Take 1 mg by mouth daily.   guaiFENesin 600 MG 12 hr tablet Commonly known as:  MUCINEX Take 1 tablet (600 mg total) by  mouth 2 (two) times daily.   lanthanum 500 MG chewable tablet Commonly known as:  FOSRENOL Chew 1,000 mg by mouth 3 (three) times daily with meals. And 1 with snacks   latanoprost 0.005 % ophthalmic solution Commonly known as:  XALATAN Place 1 drop into both eyes at bedtime.   levothyroxine 150 MCG tablet Commonly known as:  SYNTHROID, LEVOTHROID Take 150 mcg by mouth daily before breakfast.   metroNIDAZOLE 500 MG tablet Commonly known as:  FLAGYL Take 1 tablet (500 mg total) by mouth 3 (three) times daily.   midodrine 10 MG tablet Commonly known as:  PROAMATINE Take 10 mg by mouth 2 (two) times daily.   ondansetron 8 MG tablet Commonly known as:  ZOFRAN Take 1 tablet (8 mg total) by mouth every 8 (eight) hours as needed for nausea or vomiting.   prochlorperazine 10 MG tablet Commonly known as:  COMPAZINE Take 1 tablet (10 mg total) by mouth every 6 (six) hours as needed for nausea or vomiting.   PROLENSA 0.07 % Soln Generic drug:  Bromfenac Sodium Apply 1 drop to eye daily.   SIMBRINZA 1-0.2 % Susp Generic drug:  Brinzolamide-Brimonidine INSTILL 1 DROP IN INTO BOTH EYES TWICE A DAY   simvastatin 20 MG tablet Commonly known as:  ZOCOR Take 20 mg by mouth daily with supper.   VELCADE IJ Inject as directed. Days 1, 4, 8, 11 every 21 days.       No Known Allergies  Consultations:  Nephrology   Procedures/Studies: Dg Chest 2 View  Result Date: 12/05/2016 CLINICAL DATA:  Fever. EXAM: CHEST  2 VIEW COMPARISON:  Radiographs of February 13, 2015. FINDINGS: Stable cardiomediastinal silhouette and mild central pulmonary vascular congestion. Atherosclerosis of thoracic aorta is noted. Status post right shoulder arthroplasty. No pneumothorax or pleural effusion is noted. No acute pulmonary disease is noted. IMPRESSION: No active cardiopulmonary disease.  Aortic atherosclerosis. Electronically Signed   By: Marijo Conception, M.D.   On: 12/05/2016 16:52   US Abdomen  Complete  Result Date: 12/02/2016 CLINICAL DATA:  66 year old male with history of right upper quadrant abdominal pain for 1 day. EXAM: ABDOMEN ULTRASOUND COMPLETE COMPARISON:  No priors.  CT of abdomen and pelvis 12/02/2016. FINDINGS: Gallbladder: Small echogenic foci without significant posterior acoustic shadowing, compatible with the small gallstones noted on recent CT examination. Gallbladder is only moderately distended. Gallbladder wall thickness is normal (2.1 mm). No pericholecystic fluid. Per report from the sonographer, the patient did not exhibit a sonographic Murphy's sign on examination. Common bile duct: Diameter: 5.5 mm Liver: Heterogeneous echotexture throughout the hepatic parenchyma, without discrete hepatic lesions. No intra hepatic biliary ductal dilatation. Normal hepatopetal flow in the portal vein. IVC: No abnormality visualized. Pancreas: Visualized portion unremarkable. Spleen: Normal in size. 2.1 x 2.3 x 1.9 cm anechoic to hypoechoic lesion with a few internal septations, increase through transmission and no internal blood flow, indeterminate, but favored to represent a minimally complex cyst. Notably, a lesion of this size has been present on prior CT examinations dating back to 2013, presumably benign. Right Kidney: Length: 11.3 cm. Diffuse cortical  thinning and diffusely increased cortical echogenicity, indicative of underlying medical renal disease. No hydronephrosis visualized. Multiple anechoic lesions with increased through transmission, compatible with simple cysts, measuring up to 2.8 x 2.8 x 2.9 cm in the upper pole. Innumerable other smaller lesions range from anechoic to hypoechoic, incompletely characterized, but favored to represent small cysts of varying degrees of complexity. Left Kidney: Length: 7.3 cm. Diffuse cortical thinning and diffusely increased cortical echogenicity, indicative of underlying medical renal disease. No hydronephrosis visualized. Multiple anechoic  lesions with increased through transmission, compatible with simple cysts, measuring up to 1.2 x 1.2 x 1.2 cm in the interpolar region. Innumerable other smaller lesions range from anechoic to hypoechoic, incompletely characterized, but favored to represent small cysts of varying degrees of complexity. Abdominal aorta: No aneurysm visualized. Other findings: None. IMPRESSION: 1. Severe cortical thinning in the kidneys bilaterally, with diffuse increased echogenicity throughout the renal parenchyma, suggestive of underlying medical renal disease. Associated with this, there are innumerable cystic lesions in the kidneys bilaterally, likely related to underlying renal disease. 2. Cholelithiasis without evidence of acute cholecystitis at this time. 3. Indeterminate lesion in the spleen which appears stable compared to numerous prior CT examinations going back several years, presumably a benign mildly complex cyst. Electronically Signed   By: Vinnie Langton M.D.   On: 12/02/2016 14:32   Ct Renal Stone Study  Result Date: 12/02/2016 CLINICAL DATA:  Abdominal pain and vomiting. EXAM: CT ABDOMEN AND PELVIS WITHOUT CONTRAST TECHNIQUE: Multidetector CT imaging of the abdomen and pelvis was performed following the standard protocol without IV contrast. COMPARISON:  10/01/2011 FINDINGS: Lower chest: No acute abnormality.  Cardiac enlargement noted. Hepatobiliary: No focal liver abnormality. Small stones are identified within the dependent portion of the gallbladder. Pancreas: Unremarkable. No pancreatic ductal dilatation or surrounding inflammatory changes. Spleen: No change in 2.5 cm low-attenuation structure within the spleen, image number 9 of series 2. Adrenals/Urinary Tract: The adrenal glands are normal. End-stage bilateral kidneys identified containing multiple cysts. Bilateral renal calculi are identified on the left these measure up to 5 mm. On the right these measure up to 5 mm. No hydronephrosis or hydroureter  identified. No ureteral lithiasis. Calcified mass within the left lower quadrant of the abdomen compatible with chronic infarcted renal graft. Urinary bladder is unremarkable for degree of distention. Stomach/Bowel: The stomach appears normal. Left upper quadrant small bowel loops exhibit mild wall thickening. No abnormal dilatation of the small or large bowel loops. No evidence for obstruction. Vascular/Lymphatic: Aortic atherosclerosis. No aneurysm. No upper abdominal adenopathy. Reproductive: Prostate is unremarkable. Other: No abdominal wall hernia or abnormality. No abdominopelvic ascites. Musculoskeletal: Changes of renal osteodystrophy identified. Multiple Schmorl node deformities are noted within the lumbar spine. IMPRESSION: 1. There is mild wall thickening involving left upper quadrant small bowel loops. Nonspecific in may reflect generalized anasarca secondary to renal failure. Cannot rule out enteritis. 2. Bilateral end-stage cystic kidneys. Renal calculi are noted bilaterally without evidence for hydronephrosis. 3. 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 4.  Aortic Atherosclerosis (ICD10-I70.0). 5. Gallstones .er Electronically Signed   By: Kerby Moors M.D.   On: 12/02/2016 13:05       Subjective: Feeling better today. No complaints  Discharge Exam: Vitals:   12/06/16 1315 12/06/16 1320  BP: (!) 98/50 (!) 92/49  Pulse: 84 87  Resp:  18  Temp:  98.4 F (36.9 C)   Vitals:   12/06/16 1230 12/06/16 1300 12/06/16 1315 12/06/16 1320  BP: (!) 92/57 98/61 (!) 98/50 [name])  92/49  Pulse: 88 84 84 87  Resp:    18  Temp:    98.4 F (36.9 C)  TempSrc:    Oral  SpO2:      Weight:      Height:        General: Pt is alert, awake, not in acute distress Cardiovascular: RRR, S1/S2 +, no rubs, no gallops Respiratory: CTA bilaterally, no wheezing, no rhonchi Abdominal: Soft, NT, ND, bowel sounds + Extremities: no edema, no cyanosis    The results of significant  diagnostics from this hospitalization (including imaging, microbiology, ancillary and laboratory) are listed below for reference.     Microbiology: Recent Results (from the past 240 hour(s))  Culture, blood (Routine x 2)     Status: None (Preliminary result)   Collection Time: 12/02/16 11:32 AM  Result Value Ref Range Status   Specimen Description BLOOD RIGHT ARM  Final   Special Requests   Final    BOTTLES DRAWN AEROBIC AND ANAEROBIC Blood Culture results may not be optimal due to an inadequate volume of blood received in culture bottles   Culture NO GROWTH 4 DAYS  Final   Report Status PENDING  Incomplete  Culture, blood (Routine x 2)     Status: None (Preliminary result)   Collection Time: 12/02/16 11:40 AM  Result Value Ref Range Status   Specimen Description BLOOD RIGHT HAND  Final   Special Requests   Final    BOTTLES DRAWN AEROBIC AND ANAEROBIC Blood Culture results may not be optimal due to an inadequate volume of blood received in culture bottles   Culture NO GROWTH 4 DAYS  Final   Report Status PENDING  Incomplete  MRSA PCR Screening     Status: None   Collection Time: 12/02/16  6:20 PM  Result Value Ref Range Status   MRSA by PCR NEGATIVE NEGATIVE Final    Comment:        The GeneXpert MRSA Assay (FDA approved for NASAL specimens only), is one component of a comprehensive MRSA colonization surveillance program. It is not intended to diagnose MRSA infection nor to guide or monitor treatment for MRSA infections.   Culture, blood (routine x 2)     Status: None (Preliminary result)   Collection Time: 12/05/16  4:11 PM  Result Value Ref Range Status   Specimen Description BLOOD RIGHT ANTECUBITAL  Final   Special Requests   Final    BOTTLES DRAWN AEROBIC AND ANAEROBIC Blood Culture results may not be optimal due to an inadequate volume of blood received in culture bottles   Culture NO GROWTH < 24 HOURS  Final   Report Status PENDING  Incomplete  Culture, blood  (routine x 2)     Status: None (Preliminary result)   Collection Time: 12/05/16  4:11 PM  Result Value Ref Range Status   Specimen Description BLOOD BLOOD RIGHT HAND  Final   Special Requests   Final    BOTTLES DRAWN AEROBIC AND ANAEROBIC Blood Culture results may not be optimal due to an inadequate volume of blood received in culture bottles   Culture NO GROWTH < 24 HOURS  Final   Report Status PENDING  Incomplete     Labs: BNP (last 3 results) No results for input(s): BNP in the last 8760 hours. Basic Metabolic Panel:  Recent Labs Lab 12/02/16 1127 12/03/16 0514 12/04/16 0512 12/05/16 0618 12/06/16 0541  NA 139 140 135 133* 133*  K 3.4* 3.4* 2.9* 3.3* 4.0  CL 97*  98* 97* 96* 95*  CO2 _0 GLUCOSE 76 74 76 82 87  BUN 24* 32* 42* 26* 38*  CREATININE 6.10* 7.45* 8.97* 5.87* 7.57*  CALCIUM 8.4* 8.2* 7.2* 7.6* 7.4*  PHOS  --   --  6.4*  --  4.7*   Liver Function Tests:  Recent Labs Lab 12/02/16 1127 12/03/16 0514 12/04/16 0512 12/06/16 0541  AST 28 14*  --   --   ALT 21 15*  --   --   ALKPHOS 57 56  --   --   BILITOT 0.7 0.7  --   --   PROT 7.8 6.8  --   --   ALBUMIN 3.9 3.4* 2.7* 2.9*    Recent Labs Lab 12/02/16 1132  LIPASE 66*   No results for input(s): AMMONIA in the last 168 hours. CBC:  Recent Labs Lab 12/02/16 1127 12/03/16 0514 12/04/16 0512 12/05/16 0618 12/06/16 0541  WBC 5.1 5.1 3.7* 4.6 5.3  NEUTROABS 4.0  --   --  3.5  --   HGB 12.2* 11.5* 8.9* 9.1* 9.0*  HCT 37.0* 35.2* 26.9* 28.0* 27.0*  MCV 78.9 79.6 79.4 79.1 78.0  PLT 74* 51* 16* 24* 34*   Cardiac Enzymes: No results for input(s): CKTOTAL, CKMB, CKMBINDEX, TROPONINI in the last 168 hours. BNP: Invalid input(s): POCBNP CBG: No results for input(s): GLUCAP in the last 168 hours. D-Dimer No results for input(s): DDIMER in the last 72 hours. Hgb A1c No results for input(s): HGBA1C in the last 72 hours. Lipid Profile No results for input(s): CHOL, HDL, LDLCALC,  TRIG, CHOLHDL, LDLDIRECT in the last 72 hours. Thyroid function studies No results for input(s): TSH, T4TOTAL, T3FREE, THYROIDAB in the last 72 hours.  Invalid input(s): FREET3 Anemia work up No results for input(s): VITAMINB12, FOLATE, FERRITIN, TIBC, IRON, RETICCTPCT in the last 72 hours. Urinalysis No results found for: COLORURINE, APPEARANCEUR, [place], [place], [place], [place], [place], [place], PROTEINUR, UROBILINOGEN, NITRITE, LEUKOCYTESUR Sepsis Labs Invalid input(s): PROCALCITONIN,  WBC,  LACTICIDVEN Microbiology Recent Results (from the past 240 hour(s))  Culture, blood (Routine x 2)     Status: None (Preliminary result)   Collection Time: 12/02/16 11:32 AM  Result Value Ref Range Status   Specimen Description BLOOD RIGHT ARM  Final   Special Requests   Final    BOTTLES DRAWN AEROBIC AND ANAEROBIC Blood Culture results may not be optimal due to an inadequate volume of blood received in culture bottles   Culture NO GROWTH 4 DAYS  Final   Report Status PENDING  Incomplete  Culture, blood (Routine x 2)     Status: None (Preliminary result)   Collection Time: 12/02/16 11:40 AM  Result Value Ref Range Status   Specimen Description BLOOD RIGHT HAND  Final   Special Requests   Final    BOTTLES DRAWN AEROBIC AND ANAEROBIC Blood Culture results may not be optimal due to an inadequate volume of blood received in culture bottles   Culture NO GROWTH 4 DAYS  Final   Report Status PENDING  Incomplete  MRSA PCR Screening     Status: None   Collection Time: 12/02/16  6:20 PM  Result Value Ref Range Status   MRSA by PCR NEGATIVE NEGATIVE Final    Comment:        The GeneXpert MRSA Assay (FDA approved for NASAL specimens only), is one component of a comprehensive MRSA colonization surveillance program. It is not intended to diagnose MRSA infection nor to guide  or monitor treatment for MRSA infections.   Culture, blood (routine x 2)     Status: None (Preliminary result)    Collection Time: 12/05/16  4:11 PM  Result Value Ref Range Status   Specimen Description BLOOD RIGHT ANTECUBITAL  Final   Special Requests   Final    BOTTLES DRAWN AEROBIC AND ANAEROBIC Blood Culture results may not be optimal due to an inadequate volume of blood received in culture bottles   Culture NO GROWTH < 24 HOURS  Final   Report Status PENDING  Incomplete  Culture, blood (routine x 2)     Status: None (Preliminary result)   Collection Time: 12/05/16  4:11 PM  Result Value Ref Range Status   Specimen Description BLOOD BLOOD RIGHT HAND  Final   Special Requests   Final    BOTTLES DRAWN AEROBIC AND ANAEROBIC Blood Culture results may not be optimal due to an inadequate volume of blood received in culture bottles   Culture NO GROWTH < 24 HOURS  Final   Report Status PENDING  Incomplete     Time coordinating discharge: Over 30 minutes  SIGNED:   Kathie Dike, MD  [facility] 12/06/2016, 7:26 PM Pager   If 7PM-7AM, please contact night-coverage [email or web address] Password [email or web address]

## 2016-12-06 NOTE — Progress Notes (Signed)
Labs reviewed with Mike Craze NP, will proceed with treatment.  Went to patients room and gave velcade per orders today. Patient is being discharged today. No complaints with chemotherapy injection so far. Follow up as scheduled.

## 2016-12-06 NOTE — Patient Instructions (Signed)
[facility] Discharge Instructions for Patients Receiving Chemotherapy   Beginning January 23rd 2017 lab work for the Cancer Center will be done in the  Main lab at [place] on 1st floor. If you have a lab appointment with the Cancer Center please come in thru the  Main Entrance and check in at the main information desk   Today you received the following chemotherapy agents   To help prevent nausea and vomiting after your treatment, we encourage you to take your nausea medication     If you develop nausea and vomiting, or diarrhea that is not controlled by your medication, call the clinic.  The clinic phone number is [phone]. Office hours are Monday-Friday 8:30am-5:00pm.  BELOW ARE SYMPTOMS THAT SHOULD BE REPORTED IMMEDIATELY:  *FEVER GREATER THAN 101.0 F  *CHILLS WITH OR WITHOUT FEVER  NAUSEA AND VOMITING THAT IS NOT CONTROLLED WITH YOUR NAUSEA MEDICATION  *UNUSUAL SHORTNESS OF BREATH  *UNUSUAL BRUISING OR BLEEDING  TENDERNESS IN MOUTH AND THROAT WITH OR WITHOUT PRESENCE OF ULCERS  *URINARY PROBLEMS  *BOWEL PROBLEMS  UNUSUAL RASH Items with * indicate a potential emergency and should be followed up as soon as possible. If you have an emergency after office hours please contact your primary care physician or go to the nearest emergency department.  Please call the clinic during office hours if you have any questions or concerns.   You may also contact the Patient Navigator at [phone] should you have any questions or need assistance in obtaining follow up care.      Resources For Cancer Patients and their Caregivers ? American Cancer Society: Can assist with transportation, wigs, general needs, runs Look Good Feel Better.        1-888-227-6333 ? Cancer Care: Provides financial assistance, online support groups, medication/co-pay assistance.  1-800-813-HOPE (4673) ? [facility] Assists [place] cancer  patients and their families through emotional , educational and financial support.  336-427-4357 ? [place] DSS Where to apply for food stamps, Medicaid and utility assistance. 336-342-1394 ? [facility]: Transportation to medical appointments. 336-347-2287 ? Social Security Administration: May apply for disability if have a Stage IV cancer. 336-342-7796 1-800-772-1213 ? [place] Aging, Disability and Transit Services: Assists with nutrition, care and transit needs. 336-349-2343         

## 2016-12-06 NOTE — Progress Notes (Addendum)
Subjective: Interval History: Still he has some cough but seems to be better. Complains of wheezing and some difficulty breathing  Objective: Vital signs in last 24 hours: Temp:  [98 F (36.7 C)-98.7 F (37.1 C)] 98.7 F (37.1 C) (05/04 0612) Pulse Rate:  [48-79] 79 (05/04 0612) Resp:  [15-18] 18 (05/04 0612) BP: (61-130)/(48-90) 92/57 (05/04 0612) SpO2:  [91 %-98 %] 97 % (05/04 0612) Weight:  [69.1 kg (152 lb 6.4 oz)] 69.1 kg (152 lb 6.4 oz) (05/04 0500) Weight change: 2.228 kg (4 lb 14.6 oz)  Intake/Output from previous day: 05/03 0701 - 05/04 0700 In: 1440 [P.O.:840; IV Piggyback:600] Out: -  Intake/Output this shift: No intake/output data recorded.  Generally patient is alert and in no apparent distress Chest: He has bilateral expiratory rhonchi and wheezing Heart exam revealed regular rate and rhythm Extremities no edema  Lab Results:  Recent Labs  12/05/16 0618 12/06/16 0541  WBC 4.6 5.3  HGB 9.1* 9.0*  HCT 28.0* 27.0*  PLT 24* 34*   BMET:   Recent Labs  12/05/16 0618 12/06/16 0541  NA 133* 133*  K 3.3* 4.0  CL 96* 95*  CO2 27 26  GLUCOSE 82 87  BUN 26* 38*  CREATININE 5.87* 7.57*  CALCIUM 7.6* 7.4*   No results for input(s): PTH in the last 72 hours. Iron Studies: No results for input(s): IRON, TIBC, TRANSFERRIN, FERRITIN in the last 72 hours.  Studies/Results: Dg Chest 2 View  Result Date: 12/05/2016 CLINICAL DATA:  Fever. EXAM: CHEST  2 VIEW COMPARISON:  Radiographs of February 13, 2015. FINDINGS: Stable cardiomediastinal silhouette and mild central pulmonary vascular congestion. Atherosclerosis of thoracic aorta is noted. Status post right shoulder arthroplasty. No pneumothorax or pleural effusion is noted. No acute pulmonary disease is noted. IMPRESSION: No active cardiopulmonary disease.  Aortic atherosclerosis. Electronically Signed   By: Marijo Conception, M.D.   On: 12/05/2016 16:52    I have reviewed the patient's current  medications.  Assessment/Plan: Problem #1 end-stage renal disease: He is status post hemodialysis On Thursday. Patient presently is asymptomatic. His potassium is normal Problem #2 anemia: His hemoglobin is below our target range. His off Epogen because of his multiple myeloma Problem #3 abdominal pain, nausea vomiting. Presently patient is feeling much better. Problem #4 metabolic bone disease: His calcium and phosphorus E0 range. Problem #5 history of cough : Still coughing. Chest x-ray no acute finding. 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 Presently patient on chemotherapy. Problem #7 pancytopenia: his white blood cell and platelets are improving.  Problem #8 hypotension: He is on midodrine. His blood pressure generally low. Plan: 1] We'll make arrangements for patient to get dialysis today 2] We'll check his renal panel and CBC in the morning 3] We'll use 4K/2.5 calcium bath. 4] Will remove  21/2 if his blood pressure tolerates    LOS: 3 days   Ellise Kovack S 12/06/2016,7:45 AM

## 2016-12-07 LAB — CULTURE, BLOOD (ROUTINE X 2)
CULTURE: NO GROWTH
Culture: NO GROWTH

## 2016-12-07 LAB — PREPARE PLATELET PHERESIS: Unit division: 0

## 2016-12-07 LAB — BPAM PLATELET PHERESIS
Blood Product Expiration Date: 201805062359
ISSUE DATE / TIME: 201805041004
UNIT TYPE AND RH: 5100

## 2016-12-09 DIAGNOSIS — D631 Anemia in chronic kidney disease: Secondary | ICD-10-CM | POA: Diagnosis not present

## 2016-12-09 DIAGNOSIS — Z992 Dependence on renal dialysis: Secondary | ICD-10-CM | POA: Diagnosis not present

## 2016-12-09 DIAGNOSIS — N2581 Secondary hyperparathyroidism of renal origin: Secondary | ICD-10-CM | POA: Diagnosis not present

## 2016-12-09 DIAGNOSIS — D472 Monoclonal gammopathy: Secondary | ICD-10-CM | POA: Diagnosis not present

## 2016-12-09 DIAGNOSIS — N186 End stage renal disease: Secondary | ICD-10-CM | POA: Diagnosis not present

## 2016-12-10 ENCOUNTER — Ambulatory Visit (HOSPITAL_COMMUNITY): Payer: Medicare Other

## 2016-12-10 LAB — CULTURE, BLOOD (ROUTINE X 2)
CULTURE: NO GROWTH
CULTURE: NO GROWTH

## 2016-12-11 DIAGNOSIS — Z992 Dependence on renal dialysis: Secondary | ICD-10-CM | POA: Diagnosis not present

## 2016-12-11 DIAGNOSIS — D631 Anemia in chronic kidney disease: Secondary | ICD-10-CM | POA: Diagnosis not present

## 2016-12-11 DIAGNOSIS — N186 End stage renal disease: Secondary | ICD-10-CM | POA: Diagnosis not present

## 2016-12-11 DIAGNOSIS — D472 Monoclonal gammopathy: Secondary | ICD-10-CM | POA: Diagnosis not present

## 2016-12-11 DIAGNOSIS — N2581 Secondary hyperparathyroidism of renal origin: Secondary | ICD-10-CM | POA: Diagnosis not present

## 2016-12-12 DIAGNOSIS — K529 Noninfective gastroenteritis and colitis, unspecified: Secondary | ICD-10-CM | POA: Diagnosis not present

## 2016-12-12 DIAGNOSIS — Z09 Encounter for follow-up examination after completed treatment for conditions other than malignant neoplasm: Secondary | ICD-10-CM | POA: Diagnosis not present

## 2016-12-12 DIAGNOSIS — I272 Pulmonary hypertension, unspecified: Secondary | ICD-10-CM | POA: Diagnosis not present

## 2016-12-12 DIAGNOSIS — N186 End stage renal disease: Secondary | ICD-10-CM | POA: Diagnosis not present

## 2016-12-12 DIAGNOSIS — J209 Acute bronchitis, unspecified: Secondary | ICD-10-CM | POA: Diagnosis not present

## 2016-12-12 DIAGNOSIS — Z789 Other specified health status: Secondary | ICD-10-CM | POA: Diagnosis not present

## 2016-12-12 DIAGNOSIS — I4891 Unspecified atrial fibrillation: Secondary | ICD-10-CM | POA: Diagnosis not present

## 2016-12-12 DIAGNOSIS — J449 Chronic obstructive pulmonary disease, unspecified: Secondary | ICD-10-CM | POA: Diagnosis not present

## 2016-12-12 DIAGNOSIS — Z299 Encounter for prophylactic measures, unspecified: Secondary | ICD-10-CM | POA: Diagnosis not present

## 2016-12-12 DIAGNOSIS — Z992 Dependence on renal dialysis: Secondary | ICD-10-CM | POA: Diagnosis not present

## 2016-12-12 DIAGNOSIS — I10 Essential (primary) hypertension: Secondary | ICD-10-CM | POA: Diagnosis not present

## 2016-12-12 DIAGNOSIS — C9000 Multiple myeloma not having achieved remission: Secondary | ICD-10-CM | POA: Diagnosis not present

## 2016-12-13 DIAGNOSIS — N2581 Secondary hyperparathyroidism of renal origin: Secondary | ICD-10-CM | POA: Diagnosis not present

## 2016-12-13 DIAGNOSIS — D631 Anemia in chronic kidney disease: Secondary | ICD-10-CM | POA: Diagnosis not present

## 2016-12-13 DIAGNOSIS — N186 End stage renal disease: Secondary | ICD-10-CM | POA: Diagnosis not present

## 2016-12-13 DIAGNOSIS — D472 Monoclonal gammopathy: Secondary | ICD-10-CM | POA: Diagnosis not present

## 2016-12-13 DIAGNOSIS — Z992 Dependence on renal dialysis: Secondary | ICD-10-CM | POA: Diagnosis not present

## 2016-12-16 DIAGNOSIS — I480 Paroxysmal atrial fibrillation: Secondary | ICD-10-CM | POA: Diagnosis not present

## 2016-12-16 DIAGNOSIS — R0602 Shortness of breath: Secondary | ICD-10-CM | POA: Diagnosis not present

## 2016-12-16 DIAGNOSIS — N186 End stage renal disease: Secondary | ICD-10-CM | POA: Diagnosis not present

## 2016-12-16 DIAGNOSIS — Z992 Dependence on renal dialysis: Secondary | ICD-10-CM | POA: Diagnosis not present

## 2016-12-16 DIAGNOSIS — I493 Ventricular premature depolarization: Secondary | ICD-10-CM | POA: Diagnosis not present

## 2016-12-16 DIAGNOSIS — I340 Nonrheumatic mitral (valve) insufficiency: Secondary | ICD-10-CM | POA: Diagnosis not present

## 2016-12-16 DIAGNOSIS — I120 Hypertensive chronic kidney disease with stage 5 chronic kidney disease or end stage renal disease: Secondary | ICD-10-CM | POA: Diagnosis not present

## 2016-12-16 DIAGNOSIS — D631 Anemia in chronic kidney disease: Secondary | ICD-10-CM | POA: Diagnosis not present

## 2016-12-16 DIAGNOSIS — I4891 Unspecified atrial fibrillation: Secondary | ICD-10-CM | POA: Diagnosis not present

## 2016-12-16 DIAGNOSIS — C9000 Multiple myeloma not having achieved remission: Secondary | ICD-10-CM | POA: Diagnosis not present

## 2016-12-16 DIAGNOSIS — Z7901 Long term (current) use of anticoagulants: Secondary | ICD-10-CM | POA: Diagnosis not present

## 2016-12-17 ENCOUNTER — Encounter (HOSPITAL_COMMUNITY): Payer: Medicare Other | Attending: Oncology

## 2016-12-17 ENCOUNTER — Encounter (HOSPITAL_COMMUNITY): Payer: Medicare Other

## 2016-12-17 ENCOUNTER — Encounter (HOSPITAL_BASED_OUTPATIENT_CLINIC_OR_DEPARTMENT_OTHER): Payer: Medicare Other | Admitting: Oncology

## 2016-12-17 ENCOUNTER — Encounter (HOSPITAL_COMMUNITY): Payer: Self-pay

## 2016-12-17 VITALS — BP 86/55 | HR 89 | Temp 97.6°F | Resp 18 | Wt 150.0 lb

## 2016-12-17 DIAGNOSIS — R634 Abnormal weight loss: Secondary | ICD-10-CM

## 2016-12-17 DIAGNOSIS — C9000 Multiple myeloma not having achieved remission: Secondary | ICD-10-CM

## 2016-12-17 DIAGNOSIS — R5383 Other fatigue: Secondary | ICD-10-CM

## 2016-12-17 DIAGNOSIS — R630 Anorexia: Secondary | ICD-10-CM

## 2016-12-17 DIAGNOSIS — D696 Thrombocytopenia, unspecified: Secondary | ICD-10-CM

## 2016-12-17 DIAGNOSIS — Z992 Dependence on renal dialysis: Secondary | ICD-10-CM | POA: Diagnosis not present

## 2016-12-17 DIAGNOSIS — Z5112 Encounter for antineoplastic immunotherapy: Secondary | ICD-10-CM

## 2016-12-17 LAB — COMPREHENSIVE METABOLIC PANEL
ALBUMIN: 3 g/dL — AB (ref 3.5–5.0)
ALT: 12 U/L — AB (ref 17–63)
AST: 23 U/L (ref 15–41)
Alkaline Phosphatase: 64 U/L (ref 38–126)
Anion gap: 12 (ref 5–15)
BILIRUBIN TOTAL: 0.8 mg/dL (ref 0.3–1.2)
BUN: 33 mg/dL — AB (ref 6–20)
CO2: 27 mmol/L (ref 22–32)
Calcium: 7 mg/dL — ABNORMAL LOW (ref 8.9–10.3)
Chloride: 99 mmol/L — ABNORMAL LOW (ref 101–111)
Creatinine, Ser: 11.38 mg/dL — ABNORMAL HIGH (ref 0.61–1.24)
GFR calc Af Amer: 5 mL/min — ABNORMAL LOW (ref >60)
GFR, EST NON AFRICAN AMERICAN: 4 mL/min — AB (ref >60)
GLUCOSE: 93 mg/dL (ref 65–99)
Potassium: 3.5 mmol/L (ref 3.5–5.1)
Sodium: 138 mmol/L (ref 135–145)
TOTAL PROTEIN: 6.4 g/dL — AB (ref 6.5–8.1)

## 2016-12-17 LAB — CBC WITH DIFFERENTIAL/PLATELET
BASOS ABS: 0 10*3/uL (ref 0.0–0.1)
BASOS PCT: 1 %
Eosinophils Absolute: 0.1 10*3/uL (ref 0.0–0.7)
Eosinophils Relative: 2 %
HEMATOCRIT: 27 % — AB (ref 39.0–52.0)
Hemoglobin: 8.8 g/dL — ABNORMAL LOW (ref 13.0–17.0)
LYMPHS PCT: 28 %
Lymphs Abs: 1.7 10*3/uL (ref 0.7–4.0)
MCH: 25.7 pg — ABNORMAL LOW (ref 26.0–34.0)
MCHC: 32.6 g/dL (ref 30.0–36.0)
MCV: 78.7 fL (ref 78.0–100.0)
Monocytes Absolute: 0.5 10*3/uL (ref 0.1–1.0)
Monocytes Relative: 9 %
NEUTROS PCT: 60 %
Neutro Abs: 3.6 10*3/uL (ref 1.7–7.7)
Platelets: 181 10*3/uL (ref 150–400)
RBC: 3.43 MIL/uL — ABNORMAL LOW (ref 4.22–5.81)
RDW: 15.4 % (ref 11.5–15.5)
WBC: 6 10*3/uL (ref 4.0–10.5)

## 2016-12-17 MED ORDER — ONDANSETRON HCL 4 MG PO TABS
8.0000 mg | ORAL_TABLET | Freq: Once | ORAL | Status: AC
  Administered 2016-12-17: 8 mg via ORAL
  Filled 2016-12-17: qty 2

## 2016-12-17 MED ORDER — BORTEZOMIB CHEMO SQ INJECTION 3.5 MG (2.5MG/ML)
1.3000 mg/m2 | Freq: Once | INTRAMUSCULAR | Status: AC
  Administered 2016-12-17: 2.5 mg via SUBCUTANEOUS
  Filled 2016-12-17: qty 2.5

## 2016-12-17 MED ORDER — MEGESTROL ACETATE 20 MG PO TABS: 20.0000 mg | ORAL_TABLET | Freq: Two times a day (BID) | ORAL | 2 refills | Status: DC

## 2016-12-17 NOTE — Patient Instructions (Signed)
[facility] at [facility] Discharge Instructions  RECOMMENDATIONS MADE BY THE CONSULTANT AND ANY TEST RESULTS WILL BE SENT TO YOUR REFERRING PHYSICIAN.  You were seen today by Dr. Louise Zhou Follow up in 4 weeks   Thank you for choosing [facility] at [facility] to provide your oncology and hematology care.  To afford each patient quality time with our provider, please arrive at least 15 minutes before your scheduled appointment time.    If you have a lab appointment with the Cancer Center please come in thru the  Main Entrance and check in at the main information desk  You need to re-schedule your appointment should you arrive 10 or more minutes late.  We strive to give you quality time with our providers, and arriving late affects you and other patients whose appointments are after yours.  Also, if you no show three or more times for appointments you may be dismissed from the clinic at the providers discretion.     Again, thank you for choosing [facility].  Our hope is that these requests will decrease the amount of time that you wait before being seen by our physicians.       _____________________________________________________________  Should you have questions after your visit to [facility], please contact our office at [phone] between the hours of 8:30 a.m. and 4:30 p.m.  Voicemails left after 4:30 p.m. will not be returned until the following business day.  For prescription refill requests, have your pharmacy contact our office.       Resources For Cancer Patients and their Caregivers ? American Cancer Society: Can assist with transportation, wigs, general needs, runs Look Good Feel Better.        1-888-227-6333 ? Cancer Care: Provides financial assistance, online support groups, medication/co-pay assistance.  1-800-813-HOPE (4673) ? [facility] Assists [place] cancer  patients and their families through emotional , educational and financial support.  336-427-4357 ? [place] DSS Where to apply for food stamps, Medicaid and utility assistance. 336-342-1394 ? RCATS: Transportation to medical appointments. 336-347-2287 ? Social Security Administration: May apply for disability if have a Stage IV cancer. 336-342-7796 1-800-772-1213 ? [place] Aging, Disability and Transit Services: Assists with nutrition, care and transit needs. 336-349-2343  Cancer Center Support Programs: @[email or web address]@ > Cancer Support Group  2nd Tuesday of the month 1pm-2pm, Journey Room  > Creative Journey  3rd Tuesday of the month 1130am-1pm, Journey Room  > Look Good Feel Better  1st Wednesday of the month 10am-12 noon, Journey Room (Call American Cancer Society to register 1-800-395-5775)    

## 2016-12-17 NOTE — Patient Instructions (Signed)
[facility] Discharge Instructions for Patients Receiving Chemotherapy   Beginning January 23rd 2017 lab work for the [facility] will be done in the  Main lab at [facility] on 1st floor. If you have a lab appointment with the [facility] please come in thru the  Main Entrance and check in at the main information desk   Today you received the following chemotherapy agents velcade  To help prevent nausea and vomiting after your treatment, we encourage you to take your nausea medication     If you develop nausea and vomiting, or diarrhea that is not controlled by your medication, call the clinic.  The clinic phone number is [phone]. Office hours are Monday-Friday 8:30am-5:00pm.  BELOW ARE SYMPTOMS THAT SHOULD BE REPORTED IMMEDIATELY:  *FEVER GREATER THAN 101.0 F  *CHILLS WITH OR WITHOUT FEVER  NAUSEA AND VOMITING THAT IS NOT CONTROLLED WITH YOUR NAUSEA MEDICATION  *UNUSUAL SHORTNESS OF BREATH  *UNUSUAL BRUISING OR BLEEDING  TENDERNESS IN MOUTH AND THROAT WITH OR WITHOUT PRESENCE OF ULCERS  *URINARY PROBLEMS  *BOWEL PROBLEMS  UNUSUAL RASH Items with * indicate a potential emergency and should be followed up as soon as possible. If you have an emergency after office hours please contact your primary care physician or go to the nearest emergency department.  Please call the clinic during office hours if you have any questions or concerns.   You may also contact the Patient Navigator at [phone] should you have any questions or need assistance in obtaining follow up care.      Resources For Cancer Patients and their Caregivers ? American Cancer Society: Can assist with transportation, wigs, general needs, runs Look Good Feel Better.        519-272-3460 ? Cancer Care: Provides financial assistance, online support groups, medication/co-pay assistance.  1-800-813-HOPE (404)258-5879) ? [facility] Assists [place]  cancer patients and their families through emotional , educational and financial support.  (919)135-5470 ? [place] DSS Where to apply for food stamps, Medicaid and utility assistance. 904-235-6320 ? RCATS: Transportation to medical appointments. (817)738-2551 ? Social Security Administration: May apply for disability if have a Stage IV cancer. 251-191-7730 365-492-1791 ? [employer], Disability and Transit Services: Assists with nutrition, care and transit needs. 385-755-1667

## 2016-12-17 NOTE — Progress Notes (Signed)
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  Cyclophosphamide was dose reduced for hemodialysis with further dose-reduction due to thrombocytopenia.  HISTORY OF PRESENTING ILLNESS:  Brian Liu 66 y.o. 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  He has been seen by Dr. Norma Fredrickson at [facility].  He is now on CyBorD beginning on 11/05/2016 at the request of Dr. Norma Fredrickson.  Cyclophosphamide was dose reduced for hemodialysis with further dose-reduction due to thrombocytopenia.  The patient is accompanied by family today. He reports he is doing well except worsening fatigue and decreased appetite. He denies chest or abdominal pain. The patient reports shortness of breath. He reports he has lost 5-6 lbs recently. He denies bleeding or bruising concerns.   The patient went for follow up with Dr. Norma Fredrickson at [facility] for potential bone marrow transplant followup and he is scheduled to undergo bone marrow biopsy next week.     MEDICAL HISTORY:  Past Medical History:  Diagnosis Date  . Atrial fibrillation (HCC)    Permanent, Not on Coumadin because of history of lower GI bleed  . Diastolic dysfunction    Restrictive  diastolic filling pattern echo,  February, 2013  . Dyslipidemia   . Ejection fraction    EF 60-65%, echo, 2013  . ESRD (end stage renal disease) ([place])    Dialysis, fistula in left arm  . Hypertension   . Lower gastrointestinal bleed   . LVH (left ventricular hypertrophy)   . 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 Mitral regurgitation    Mild by echo, 2013  . Multiple myeloma ([place]) 04/30/2016  . Pancytopenia  ([place])    Dr Ignacia Bayley  . Pulmonary hypertension (HCC)    Severe, PA pressure 65-70 mm of mercury, echo, 2013  . Tobacco abuse     SURGICAL HISTORY: Past Surgical History:  Procedure Laterality Date  . AV FISTULA PLACEMENT Left 1992   Left arm   . 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 Marital status: Widowed    Spouse name: N/A  . Number of children: N/A  . Years of education: N/A   Occupational History  . Not on file.   Social History Main Topics  . Smoking status: Never Smoker  . Smokeless tobacco: Never Used  . Alcohol use No  . Drug use: No  . Sexual activity: Not on file   Other Topics Concern  . Not on file   Social History Narrative   Lives alone in a one story home.  Has 2 sons.     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     Education high school.   Widowed since 2003. 2 boys. One grandson. Used to drive a forklift. Born here in [place]. Non/never smoker No Pets. Loves basketball. Used to play frequently, now just watches.   FAMILY HISTORY: Family History  Problem Relation Age of Onset  . Other Mother        Deceased, 55  . Stomach cancer Father        Deceased,  44  . Kidney failure Son   . Healthy Son   7 sisters, 8 living 4 brothers, 2 living Mother died at 74 from natural causes Father died at 55 from stomach cancer He notes his deceased siblings died from stomach cancer, complications of HTN, heart issues  ALLERGIES:  has No Known Allergies.  MEDICATIONS:  Current Outpatient Prescriptions  Medication Sig Dispense Refill  . acyclovir (ZOVIRAX) 400 MG tablet Take 1 tablet (400 mg total) by mouth 2 (two) times daily. 60 tablet 3  . Bortezomib (VELCADE IJ) Inject as directed. Days 1, 4, 8, 11 every 21 days.    . Bromfenac Sodium (PROLENSA) 0.07 % SOLN Apply 1 drop to eye daily.    . cinacalcet  (SENSIPAR) 30 MG tablet Take 30 mg by mouth.    . ciprofloxacin (CIPRO) 500 MG tablet Take 1 tablet (500 mg total) by mouth daily with breakfast. 4 tablet 0  . cycloSPORINE (RESTASIS) 0.05 % ophthalmic emulsion Place 1 drop into both eyes 2 (two) times daily.    [name] dexamethasone (DECADRON) 4 MG tablet Take 10 tablets (40 mg) on days 1, 8, and 15 of chemo. Repeat every 21 days. Take 2 tablets (11m) daily for 2 days starting after days 1 and 8 of chemotherapy. 40 tablet 3  . Difluprednate (DUREZOL) 0.05 % EMUL Place 1 drop into the right eye 6 (six) times daily.    . folic acid (FOLVITE) 1 MG tablet Take 1 mg by mouth daily.    .Marland KitchenguaiFENesin (MUCINEX) 600 MG 12 hr tablet Take 1 tablet (600 mg total) by mouth 2 (two) times daily. 30 tablet 0  . lanthanum (FOSRENOL) 500 MG chewable tablet Chew 1,000 mg by mouth 3 (three) times daily with meals. And 1 with snacks    . latanoprost (XALATAN) 0.005 % ophthalmic solution Place 1 drop into both eyes at bedtime.  6  . levothyroxine (SYNTHROID, LEVOTHROID) 150 MCG tablet Take 150 mcg by mouth daily before breakfast.    . metroNIDAZOLE (FLAGYL) 500 MG tablet Take 1 tablet (500 mg total) by mouth 3 (three) times daily. 12 tablet 0  . midodrine (PROAMATINE) 10 MG tablet Take 10 mg by mouth 2 (two) times daily.     . Multiple Vitamin (DAILY VITE) TABS Take 1 tablet by mouth daily.     . ondansetron (ZOFRAN) 8 MG tablet Take 1 tablet (8 mg total) by mouth every 8 (eight) hours as needed for nausea or vomiting. 30 tablet 2  . prochlorperazine (COMPAZINE) 10 MG tablet Take 1 tablet (10 mg total) by mouth every 6 (six) hours as needed for nausea or vomiting. 30 tablet 2  . SIMBRINZA 1-0.2 % SUSP INSTILL 1 DROP IN INTO BOTH EYES TWICE A DAY  6  . simvastatin (ZOCOR) 20 MG tablet Take 20 mg by mouth daily with supper.       No current facility-administered medications for this visit.     Review of Systems  Constitutional: Positive for malaise/fatigue and weight loss  (5-6 lbs).       Decreased appetite  HENT: Negative.   Eyes: Negative.   Respiratory: Positive for shortness of breath.   Cardiovascular: Negative.  Negative for chest pain and leg swelling.  Gastrointestinal: Negative.  Negative for abdominal pain.  Genitourinary: Negative.   Musculoskeletal: Negative.   Skin: Negative.   Neurological: Positive for weakness.  Endo/Heme/Allergies: Negative.  Does not bruise/bleed easily.  Psychiatric/Behavioral: Negative.   All other systems reviewed and are negative.  14 point ROS was done and is otherwise as detailed above or in HPI  PHYSICAL EXAMINATION: ECOG PERFORMANCE STATUS: 1 - Symptomatic but completely ambulatory  Vitals:   12/17/16 1411  BP: (!) 86/55  Pulse: 89  Resp: 18  Temp: 97.6 F (36.4 C)   Filed Weights   12/17/16 1411  Weight: 150 lb (68 kg)     Physical Exam  Constitutional: He is oriented to person, place, and time. He appears well-developed and well-nourished.  HENT:  Head: Normocephalic and atraumatic.  Cardiovascular: Normal rate and regular rhythm.  Exam reveals no gallop and no friction rub.   Murmur heard.  Systolic murmur is present with a grade of 3/6  Pulmonary/Chest: Effort normal and breath sounds normal. No respiratory distress. He has no wheezes. He has no rales. He exhibits no tenderness.  Abdominal: Soft. He exhibits no distension and no mass. There is no tenderness. There is no guarding.  Musculoskeletal: Normal range of motion. He exhibits no edema.  Neurological: He is alert and oriented to person, place, and time.  Skin: Skin is warm and dry.  Psychiatric: He has a normal mood and affect. His behavior is normal. Judgment and thought content normal.     LABORATORY DATA:  I have reviewed the data as listed:  Lab Results  Component Value Date   WBC 5.3 12/06/2016   HGB 9.0 (L) 12/06/2016   HCT 27.0 (L) 12/06/2016   MCV 78.0 12/06/2016   PLT 34 (L) 12/06/2016   CMP     Component  Value Date/Time   NA 133 (L) 12/06/2016 0541   K 4.0 12/06/2016 0541   CL 95 (L) 12/06/2016 0541   CO2 26 12/06/2016 0541   GLUCOSE 87 12/06/2016 0541   BUN 38 (H) 12/06/2016 0541   CREATININE 7.57 (H) 12/06/2016 0541   CALCIUM 7.4 (L) 12/06/2016 0541   PROT 6.8 12/03/2016 0514   ALBUMIN 2.9 (L) 12/06/2016 0541   AST 14 (L) 12/03/2016 0514   ALT 15 (L) 12/03/2016 0514   ALKPHOS 56 12/03/2016 0514   BILITOT 0.7 12/03/2016 0514   GFRNONAA 7 (L) 12/06/2016 0541   GFRAA 8 (L) 12/06/2016 0541    PATHOLOGY:   RADIOGRAPHIC STUDIES: I have personally reviewed the radiological images as listed and agreed with the findings in the report. No results found.   Study Result   CLINICAL DATA:  Initial treatment strategy for multiple myeloma.  EXAM: NUCLEAR MEDICINE PET WHOLE BODY  TECHNIQUE: Seven point for mCi F-18 FDG was injected intravenously. Full-ring PET imaging was performed from the vertex to the feet after the radiotracer. CT data was obtained and used for attenuation correction and anatomic localization.  FASTING BLOOD GLUCOSE:  Value: 92 mg/dl  COMPARISON:  None.  FINDINGS: NECK  Hypermetabolic left-sided level II lymph node demonstrates SUV max = 4.3. Increased activity is identified in the left sphenoid sinus. CT images confirm changes of chronic sinusitis.  CHEST  1.5 cm short axis subcarinal lymph node is hypermetabolic with SUV max = 6.6. 1.1 cm short axis right hilar lymph node is hypermetabolic with SUV max = 5.6. Small AP window lymph nodes measure up to about 10 mm short axis and demonstrate hypermetabolic FDG accumulation. 6 mm left lower lobe pulmonary nodule visualized image 57 series 8, without discernible discrete FDG uptake on PET imaging. Biapical pleural-parenchymal scarring is evident.  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 Patient is status post right shoulder replacement with evidence of FDG  accumulation in the region of the prosthesis. FDG uptake is seen in the left shoulder, consistent with a degenerative change.  ABDOMEN/PELVIS  2.9 cm hypo attenuating lesion in the dome of the spleen shows no hypermetabolism on PET images. Kidneys are atrophic with non hypermetabolic cystic lesions noted bilaterally.  Calcified renal transplant identified left lower quadrant. Abdominal aortic atherosclerosis is evident.  SKELETON  Degenerative changes noted in the hips bilaterally, better evaluated on prior dedicated CT imaging. Mild FDG uptake in the hips is compatible with the known bony changes. Scattered areas of subtle FDG uptake are seen in the thoracolumbar spine, associated with what appears to be Schmorl's nodes.  IMPRESSION: 1. Hypermetabolic level II left-sided cervical lymph node associated with hypermetabolic subcarinal, AP window, and right hilar lymphadenopathy. 2. No definite suspicious hypermetabolic bony lesions. 3. 6 mm left lower lobe pulmonary nodule. Attention on follow-up recommended. 4. 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 5. Hypo attenuating 2.9 cm splenic lesion without hypermetabolism. 6. Abdominal aortic atherosclerosis. 7. Degenerative changes in the left shoulder and both hips.   Electronically Signed   By: Misty Stanley M.D.   On: 07/04/2016 12:26   Study Result   CLINICAL DATA:  Lytic lesions in the femoral necks iliac crests in patient with history of end-stage renal disease. Status post bone biopsy 06/11/2016.  EXAM: CT PELVIS WITHOUT CONTRAST  TECHNIQUE: Multidetector CT imaging of the pelvis was performed following the standard protocol without intravenous contrast.  COMPARISON:  CT abdomen and pelvis 10/01/2011.  FINDINGS: Erosions are seen about the sacroiliac joints bilaterally. More extensive erosive changes also present about the femoral heads and necks, much worse on  the right. Erosions are present about the SI joints and femoral necks on the prior CT scan but have progressed since that study. Intramedullary spaces of image bones demonstrate subtle increased density. No fracture is identified. No notable degenerative change about the hips.  Marked synovial/capsular thickening is present about the hips bilaterally, much worse on the right. Volume of joint effusions is decreased since the prior CT scan. Musculature about the pelvis is intact.  There is partial visualization of a calcified left lower quadrant renal transplant. Atherosclerotic vascular disease is noted. Atrophy of the left rectus abdominis is identified.  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 Erosive change about the hips could also be due to coexistent amyloidosis. The appearance is not suggestive neoplastic process.  Decreased hip joint fusion since the prior CT scan with persist capsular thickening compatible synovitis like related to renal osteodystrophy.  Atherosclerosis.   Electronically Signed   By: Inge Rise M.D.   On: 06/14/2016 09:14    Study Result   CLINICAL DATA:  Lytic bone lesions on x-ray. Chronic renal failure on dialysis. History of failed renal transplant  EXAM: CT LUMBAR SPINE WITHOUT CONTRAST  TECHNIQUE: Multidetector CT imaging of the lumbar spine was performed without intravenous contrast administration. Multiplanar CT image reconstructions were also generated.  COMPARISON:  CT pelvis 06/13/2016  FINDINGS: Segmentation: Normal  Alignment: Normal  Vertebrae: Multiple lytic lesions are seen in the vertebral body endplates from E36 through L4. L4-5 and L5-S1 are spared. These lytic lesions communicate with the disc space and are surrounded by sclerotic change. No lesions are  seen within the central portion of the vertebra. Small lytic lesions are seen around the facet joint on the  right is L2-3. Posterior elements otherwise spared. No vertebral fracture. 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 This has a similar appearance and etiology as the lytic lesions around the femoral head and neck bilaterally on the CT pelvis. Similar but less prominent lesions in the SI joints bilaterally.  Paraspinal and other soft tissues: Calcified mass in the left pelvis compatible with failed renal transplant. Atherosclerotic disease. Bilateral renal atrophy atrophy from renal failure. Bilateral renal lesions compatible with cysts.  Disc levels: L1-2: Disc and facet degeneration. Disc bulging. Mild spinal stenosis.  L2-3: Moderate spinal stenosis. Disc bulging and facet and ligamentum flavum hypertrophy.  L3-4: Mild spinal stenosis. Disc bulging and posterior element hypertrophy.  L4-5: Disc bulging and mild facet degeneration. Subarticular stenosis bilaterally.  L5-S1: Mild disc and facet degeneration without significant stenosis.  IMPRESSION: Multiple endplate lytic lesions are seen from T11 through L4. These are associated with surrounding sclerosis and have a benign appearance. 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 Similar lesions are seen in the SI joints and in the hips bilaterally.  Bilateral renal atrophy and calcified failed renal transplant in the left pelvis.  Multilevel degenerative change and spinal stenosis due to disc and facet degeneration.   Electronically Signed   By: Franchot Gallo M.D.   On: 06/14/2016 06:57     ASSESSMENT & PLAN:  IgG kappa multiple myeloma, Stage III by ISS criteria and possible amyloid.  He has been seen by Dr. Norma Fredrickson.  On hemodialysis on M-W-F (6-10 AM).  He started CyBorD on 11/05/2016.  Cyclophosphamide was dose  reduced for hemodialysis with further dose-reduction due to thrombocytopenia. Cyclophosphamide dose is now reduced to 250 mg on days 1, 8, 15 every 21 days.   PLAN: Labs reviewed with patient today. His thrombocytopenia has improved since cytoxan has been dose reduced; proceed with cycle 3 day 11 today.   I will prescribe megace as an appetite stimulant for the patient. He understands this may carry a risk of blood clots, and will contact the clinic if he has any concerns.  Follow up with Dr. Norma Fredrickson at [facility] as scheduled, repeat bone marrow bx planned for next Tuesday.   RTC in 4 weeks for follow up.    All questions were answered. The patient knows to call the clinic with any problems, questions or concerns.  This document serves as a record of services personally performed by Twana First, MD. It was created on her behalf by Maryla Morrow, a trained medical scribe. The creation of this record is based on the scribe's personal observations and the provider's statements to them. This document has been checked and approved by the attending provider.  I have reviewed the above documentation for accuracy and completeness and I agree with the above.  This note was electronically signed by:  Twana First, MD 12/17/2016 2:31 PM

## 2016-12-17 NOTE — Progress Notes (Signed)
Brian Liu presents today for injection per MD orders. Velcade 2.5mg  administered SQ in right Abdomen. Administration without incident. Patient tolerated well.

## 2016-12-18 DIAGNOSIS — D472 Monoclonal gammopathy: Secondary | ICD-10-CM | POA: Diagnosis not present

## 2016-12-18 DIAGNOSIS — D631 Anemia in chronic kidney disease: Secondary | ICD-10-CM | POA: Diagnosis not present

## 2016-12-18 DIAGNOSIS — N2581 Secondary hyperparathyroidism of renal origin: Secondary | ICD-10-CM | POA: Diagnosis not present

## 2016-12-18 DIAGNOSIS — Z992 Dependence on renal dialysis: Secondary | ICD-10-CM | POA: Diagnosis not present

## 2016-12-18 DIAGNOSIS — N186 End stage renal disease: Secondary | ICD-10-CM | POA: Diagnosis not present

## 2016-12-20 ENCOUNTER — Encounter (HOSPITAL_COMMUNITY): Payer: Self-pay

## 2016-12-20 ENCOUNTER — Encounter (HOSPITAL_BASED_OUTPATIENT_CLINIC_OR_DEPARTMENT_OTHER): Payer: Medicare Other

## 2016-12-20 VITALS — BP 106/67 | HR 69 | Temp 97.9°F | Resp 18

## 2016-12-20 DIAGNOSIS — Z5112 Encounter for antineoplastic immunotherapy: Secondary | ICD-10-CM

## 2016-12-20 DIAGNOSIS — N186 End stage renal disease: Secondary | ICD-10-CM | POA: Diagnosis not present

## 2016-12-20 DIAGNOSIS — C9000 Multiple myeloma not having achieved remission: Secondary | ICD-10-CM | POA: Diagnosis not present

## 2016-12-20 DIAGNOSIS — Z992 Dependence on renal dialysis: Secondary | ICD-10-CM | POA: Diagnosis not present

## 2016-12-20 DIAGNOSIS — N2581 Secondary hyperparathyroidism of renal origin: Secondary | ICD-10-CM | POA: Diagnosis not present

## 2016-12-20 DIAGNOSIS — D472 Monoclonal gammopathy: Secondary | ICD-10-CM | POA: Diagnosis not present

## 2016-12-20 DIAGNOSIS — D631 Anemia in chronic kidney disease: Secondary | ICD-10-CM | POA: Diagnosis not present

## 2016-12-20 MED ORDER — ONDANSETRON HCL 4 MG PO TABS
8.0000 mg | ORAL_TABLET | Freq: Once | ORAL | Status: AC
  Administered 2016-12-20: 8 mg via ORAL
  Filled 2016-12-20: qty 2

## 2016-12-20 MED ORDER — BORTEZOMIB CHEMO SQ INJECTION 3.5 MG (2.5MG/ML)
1.3000 mg/m2 | Freq: Once | INTRAMUSCULAR | Status: AC
  Administered 2016-12-20: 2.5 mg via SUBCUTANEOUS
  Filled 2016-12-20: qty 1.4

## 2016-12-20 MED ORDER — ONDANSETRON 8 MG PO TBDP
ORAL_TABLET | ORAL | Status: AC
  Filled 2016-12-20: qty 1

## 2016-12-20 NOTE — Patient Instructions (Signed)
[facility] Discharge Instructions for Patients Receiving Chemotherapy   Beginning January 23rd 2017 lab work for the Cancer Center will be done in the  Main lab at  on 1st floor. If you have a lab appointment with the Cancer Center please come in thru the  Main Entrance and check in at the main information desk   Today you received the following chemotherapy agents Velcade injection. Follow-up as scheduled. Call clinic for any questions or concerns  To help prevent nausea and vomiting after your treatment, we encourage you to take your nausea medication   If you develop nausea and vomiting, or diarrhea that is not controlled by your medication, call the clinic.  The clinic phone number is [phone]. Office hours are Monday-Friday 8:30am-5:00pm.  BELOW ARE SYMPTOMS THAT SHOULD BE REPORTED IMMEDIATELY:  *FEVER GREATER THAN 101.0 F  *CHILLS WITH OR WITHOUT FEVER  NAUSEA AND VOMITING THAT IS NOT CONTROLLED WITH YOUR NAUSEA MEDICATION  *UNUSUAL SHORTNESS OF BREATH  *UNUSUAL BRUISING OR BLEEDING  TENDERNESS IN MOUTH AND THROAT WITH OR WITHOUT PRESENCE OF ULCERS  *URINARY PROBLEMS  *BOWEL PROBLEMS  UNUSUAL RASH Items with * indicate a potential emergency and should be followed up as soon as possible. If you have an emergency after office hours please contact your primary care physician or go to the nearest emergency department.  Please call the clinic during office hours if you have any questions or concerns.   You may also contact the Patient Navigator at [phone] should you have any questions or need assistance in obtaining follow up care.      Resources For Cancer Patients and their Caregivers ? American Cancer Society: Can assist with transportation, wigs, general needs, runs Look Good Feel Better.        1-888-227-6333 ? Cancer Care: Provides financial assistance, online support groups, medication/co-pay assistance.   1-800-813-HOPE (4673) ? [facility] Assists [place] cancer patients and their families through emotional , educational and financial support.  336-427-4357 ? [place] DSS Where to apply for food stamps, Medicaid and utility assistance. 336-342-1394 ? [facility]: Transportation to medical appointments. 336-347-2287 ? Social Security Administration: May apply for disability if have a Stage IV cancer. 336-342-7796 1-800-772-1213 ? [place] Aging, Disability and Transit Services: Assists with nutrition, care and transit needs. 336-349-2343         

## 2016-12-20 NOTE — Progress Notes (Signed)
Brian Liu tolerated Velcade injection well without complaints or incident. Labs reviewed from 12/17/16. VSS Pt discharged via wheelchair in satisfactory condition accompanied by family

## 2016-12-23 ENCOUNTER — Telehealth (HOSPITAL_COMMUNITY): Payer: Self-pay

## 2016-12-23 ENCOUNTER — Inpatient Hospital Stay (HOSPITAL_COMMUNITY): Payer: Medicare Other

## 2016-12-23 NOTE — Telephone Encounter (Signed)
See telephone encounter nhote

## 2016-12-24 ENCOUNTER — Encounter (HOSPITAL_COMMUNITY): Payer: Self-pay

## 2016-12-24 ENCOUNTER — Other Ambulatory Visit: Payer: Self-pay

## 2016-12-24 ENCOUNTER — Telehealth (HOSPITAL_COMMUNITY): Payer: Self-pay | Admitting: Emergency Medicine

## 2016-12-24 ENCOUNTER — Other Ambulatory Visit (HOSPITAL_COMMUNITY): Payer: Medicare Other

## 2016-12-24 ENCOUNTER — Inpatient Hospital Stay (HOSPITAL_COMMUNITY): Payer: Medicare Other

## 2016-12-24 ENCOUNTER — Emergency Department (HOSPITAL_COMMUNITY): Payer: Medicare Other

## 2016-12-24 ENCOUNTER — Inpatient Hospital Stay (HOSPITAL_COMMUNITY)
Admission: EM | Admit: 2016-12-24 | Discharge: 2016-12-26 | DRG: 391 | Disposition: A | Discharge status: Home Health | Payer: Medicare Other | Attending: Internal Medicine | Admitting: Internal Medicine

## 2016-12-24 DIAGNOSIS — D696 Thrombocytopenia, unspecified: Secondary | ICD-10-CM | POA: Diagnosis present

## 2016-12-24 DIAGNOSIS — D631 Anemia in chronic kidney disease: Secondary | ICD-10-CM | POA: Diagnosis present

## 2016-12-24 DIAGNOSIS — E876 Hypokalemia: Secondary | ICD-10-CM | POA: Diagnosis present

## 2016-12-24 DIAGNOSIS — R778 Other specified abnormalities of plasma proteins: Secondary | ICD-10-CM

## 2016-12-24 DIAGNOSIS — M8580 Other specified disorders of bone density and structure, unspecified site: Secondary | ICD-10-CM | POA: Diagnosis present

## 2016-12-24 DIAGNOSIS — I482 Chronic atrial fibrillation: Secondary | ICD-10-CM | POA: Diagnosis not present

## 2016-12-24 DIAGNOSIS — R197 Diarrhea, unspecified: Secondary | ICD-10-CM | POA: Diagnosis not present

## 2016-12-24 DIAGNOSIS — R404 Transient alteration of awareness: Secondary | ICD-10-CM | POA: Diagnosis not present

## 2016-12-24 DIAGNOSIS — Z885 Allergy status to narcotic agent status: Secondary | ICD-10-CM

## 2016-12-24 DIAGNOSIS — Z66 Do not resuscitate: Secondary | ICD-10-CM | POA: Diagnosis present

## 2016-12-24 DIAGNOSIS — I120 Hypertensive chronic kidney disease with stage 5 chronic kidney disease or end stage renal disease: Secondary | ICD-10-CM | POA: Diagnosis present

## 2016-12-24 DIAGNOSIS — T451X5A Adverse effect of antineoplastic and immunosuppressive drugs, initial encounter: Secondary | ICD-10-CM | POA: Diagnosis present

## 2016-12-24 DIAGNOSIS — C9000 Multiple myeloma not having achieved remission: Secondary | ICD-10-CM | POA: Diagnosis present

## 2016-12-24 DIAGNOSIS — R7989 Other specified abnormal findings of blood chemistry: Secondary | ICD-10-CM

## 2016-12-24 DIAGNOSIS — E860 Dehydration: Secondary | ICD-10-CM | POA: Diagnosis not present

## 2016-12-24 DIAGNOSIS — I340 Nonrheumatic mitral (valve) insufficiency: Secondary | ICD-10-CM | POA: Diagnosis present

## 2016-12-24 DIAGNOSIS — R748 Abnormal levels of other serum enzymes: Secondary | ICD-10-CM | POA: Diagnosis present

## 2016-12-24 DIAGNOSIS — R531 Weakness: Secondary | ICD-10-CM | POA: Diagnosis not present

## 2016-12-24 DIAGNOSIS — Z79899 Other long term (current) drug therapy: Secondary | ICD-10-CM

## 2016-12-24 DIAGNOSIS — K529 Noninfective gastroenteritis and colitis, unspecified: Secondary | ICD-10-CM

## 2016-12-24 DIAGNOSIS — N186 End stage renal disease: Secondary | ICD-10-CM | POA: Diagnosis not present

## 2016-12-24 DIAGNOSIS — I4891 Unspecified atrial fibrillation: Secondary | ICD-10-CM | POA: Diagnosis present

## 2016-12-24 DIAGNOSIS — Z992 Dependence on renal dialysis: Secondary | ICD-10-CM

## 2016-12-24 DIAGNOSIS — I9589 Other hypotension: Secondary | ICD-10-CM | POA: Diagnosis present

## 2016-12-24 DIAGNOSIS — E785 Hyperlipidemia, unspecified: Secondary | ICD-10-CM | POA: Diagnosis present

## 2016-12-24 DIAGNOSIS — G909 Disorder of the autonomic nervous system, unspecified: Secondary | ICD-10-CM | POA: Diagnosis present

## 2016-12-24 DIAGNOSIS — R112 Nausea with vomiting, unspecified: Secondary | ICD-10-CM | POA: Diagnosis not present

## 2016-12-24 DIAGNOSIS — I272 Pulmonary hypertension, unspecified: Secondary | ICD-10-CM | POA: Diagnosis present

## 2016-12-24 DIAGNOSIS — D472 Monoclonal gammopathy: Secondary | ICD-10-CM | POA: Diagnosis present

## 2016-12-24 LAB — COMPREHENSIVE METABOLIC PANEL
ALT: 12 U/L — ABNORMAL LOW (ref 17–63)
ANION GAP: 18 — AB (ref 5–15)
AST: 23 U/L (ref 15–41)
Albumin: 3.2 g/dL — ABNORMAL LOW (ref 3.5–5.0)
Alkaline Phosphatase: 60 U/L (ref 38–126)
BUN: 37 mg/dL — ABNORMAL HIGH (ref 6–20)
CHLORIDE: 95 mmol/L — AB (ref 101–111)
CO2: 26 mmol/L (ref 22–32)
Calcium: 7.4 mg/dL — ABNORMAL LOW (ref 8.9–10.3)
Creatinine, Ser: 12.15 mg/dL — ABNORMAL HIGH (ref 0.61–1.24)
GFR calc Af Amer: 4 mL/min — ABNORMAL LOW (ref >60)
GFR, EST NON AFRICAN AMERICAN: 4 mL/min — AB (ref >60)
Glucose, Bld: 89 mg/dL (ref 65–99)
POTASSIUM: 3 mmol/L — AB (ref 3.5–5.1)
Sodium: 139 mmol/L (ref 135–145)
Total Bilirubin: 0.6 mg/dL (ref 0.3–1.2)
Total Protein: 6.9 g/dL (ref 6.5–8.1)

## 2016-12-24 LAB — CBC WITH DIFFERENTIAL/PLATELET
BASOS ABS: 0 10*3/uL (ref 0.0–0.1)
Basophils Relative: 0 %
EOS ABS: 0 10*3/uL (ref 0.0–0.7)
EOS PCT: 0 %
HCT: 29.5 % — ABNORMAL LOW (ref 39.0–52.0)
Hemoglobin: 9.7 g/dL — ABNORMAL LOW (ref 13.0–17.0)
Lymphocytes Relative: 14 %
Lymphs Abs: 0.7 10*3/uL (ref 0.7–4.0)
MCH: 25.3 pg — AB (ref 26.0–34.0)
MCHC: 32.9 g/dL (ref 30.0–36.0)
MCV: 77 fL — ABNORMAL LOW (ref 78.0–100.0)
MONO ABS: 0.6 10*3/uL (ref 0.1–1.0)
Monocytes Relative: 11 %
Neutro Abs: 4.1 10*3/uL (ref 1.7–7.7)
Neutrophils Relative %: 75 %
PLATELETS: 79 10*3/uL — AB (ref 150–400)
RBC: 3.83 MIL/uL — ABNORMAL LOW (ref 4.22–5.81)
RDW: 14.8 % (ref 11.5–15.5)
WBC: 5.4 10*3/uL (ref 4.0–10.5)

## 2016-12-24 LAB — LIPASE, BLOOD: LIPASE: 91 U/L — AB (ref 11–51)

## 2016-12-24 LAB — LACTIC ACID, PLASMA
LACTIC ACID, VENOUS: 1.5 mmol/L (ref 0.5–1.9)
LACTIC ACID, VENOUS: 1.2 mmol/L (ref 0.5–1.9)

## 2016-12-24 LAB — TROPONIN I
TROPONIN I: 0.05 ng/mL — AB (ref <0.03)
Troponin I: 0.06 ng/mL (ref <0.03)

## 2016-12-24 LAB — MAGNESIUM: MAGNESIUM: 2 mg/dL (ref 1.7–2.4)

## 2016-12-24 MED ORDER — SODIUM CHLORIDE 0.9 % IV BOLUS (SEPSIS)
500.0000 mL | Freq: Once | INTRAVENOUS | Status: AC
  Administered 2016-12-24: 500 mL via INTRAVENOUS

## 2016-12-24 MED ORDER — SODIUM CHLORIDE 0.9 % IV SOLN
INTRAVENOUS | Status: DC
  Administered 2016-12-24: 14:00:00 via INTRAVENOUS

## 2016-12-24 MED ORDER — SODIUM CHLORIDE 0.9 % IV SOLN
INTRAVENOUS | Status: AC
  Administered 2016-12-24: 17:00:00 via INTRAVENOUS

## 2016-12-24 MED ORDER — HEPARIN SODIUM (PORCINE) 5000 UNIT/ML IJ SOLN: 5000.0000 [IU] | Freq: Three times a day (TID) | INTRAMUSCULAR | Status: DC

## 2016-12-24 MED ORDER — PANTOPRAZOLE SODIUM 40 MG IV SOLR
40.0000 mg | Freq: Every day | INTRAVENOUS | Status: DC
  Administered 2016-12-24 – 2016-12-25 (×2): 40 mg via INTRAVENOUS
  Filled 2016-12-24 (×2): qty 40

## 2016-12-24 MED ORDER — FENTANYL CITRATE (PF) 100 MCG/2ML IJ SOLN
25.0000 ug | INTRAMUSCULAR | Status: DC | PRN
  Administered 2016-12-25 (×3): 25 ug via INTRAVENOUS
  Filled 2016-12-24 (×3): qty 2

## 2016-12-24 NOTE — Progress Notes (Addendum)
Pharmacy Note:  Heparin for VTE prophylaxis ordered.  There is no dose adjustment for dialysis. Consider using mechanical means for VTE px as PLTC is low. Will order heparin 5000 units sq q8 hours.  Pharmacy will sign off.  Please advise if we can be of further assistance.  Excell Seltzer. PharmD

## 2016-12-24 NOTE — ED Triage Notes (Signed)
Pt says he was not supposed to come to the ER.  He wanted ems to take him to the cancer center on the 4th floor.  Calling cancer center for clarification.

## 2016-12-24 NOTE — ED Notes (Signed)
Anderson Malta, RN in cancer center reports she told pt's family to bring him to er for c/o weakness.

## 2016-12-24 NOTE — ED Provider Notes (Signed)
[facility] DEPT Provider Note   CSN: [id number] Arrival date & time: 12/24/16  1100     History   Chief Complaint Chief Complaint  Patient presents with  . Weakness    HPI Brian Liu is a 66 y.o. male.   Weakness    Pt was seen at 1210.  Per pt and his family, c/o gradual onset and worsening of persistent generalized weakness for the past several days. Pt states he had LD chemotherapy Friday (4 days ago), as well as his usual HD treatment. Pt's son states they were told the "chemo medicine was stronger than the last dose." Pt began to have multiple intermittent episodes of N/V/D the day after his chemo treatment. Pt has been unable to tol PO due to his symptoms. Pt did not go to his HD yesterday due to generalized weakness. Pt's family states pt wanted to wait to go to the [facility] today to be seen, but was told to come to the ED for evaluation. States he has been taking zofran without improvement of his symptoms. Denies abd pain, no CP/SOB, no back pain, no fevers, no black or blood in stools or emesis, no focal motor weakness.   Past Medical History:  Diagnosis Date  . Atrial fibrillation (HCC)    Permanent, Not on Coumadin because of history of lower GI bleed  . Diastolic dysfunction    Restrictive  diastolic filling pattern echo,  February, 2013  . Dyslipidemia   . Ejection fraction    EF 60-65%, echo, 2013  . ESRD (end stage renal disease) ([place])    Dialysis, fistula in left arm  . Hypertension   . Lower gastrointestinal bleed   . LVH (left ventricular hypertrophy)   . 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 Mitral regurgitation    Mild by echo, 2013  . Multiple myeloma ([place]) 04/30/2016  . Pancytopenia ([place])    Dr Ignacia Bayley  . Pulmonary hypertension (HCC)    Severe, PA pressure 65-70 mm of mercury, echo, 2013  . Tobacco abuse     Patient Active Problem List   Diagnosis Date Noted  . Enteritis 12/04/2016  . Thrush 12/04/2016   . Pancreatitis 12/02/2016  . Hypokalemia 12/02/2016  . Anemia 12/02/2016  . Abdominal pain   . Multiple myeloma ([place]) 04/30/2016  . 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 Complication of dialysis access insertion 05/17/2014  . ESRD (end stage renal disease) ([place])   . Atrial fibrillation ([place])   . Hypertension   . Pulmonary hypertension ([place])   . Mitral regurgitation   . Tobacco abuse   . Dyslipidemia   . Lower gastrointestinal bleed   . Pancytopenia ([place])   . Ejection fraction   . Diastolic dysfunction   . LVH (left ventricular hypertrophy)     Past Surgical History:  Procedure Laterality Date  . AV FISTULA PLACEMENT Left 1992   Left arm   . 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 Authorizing Provider  acyclovir (ZOVIRAX) 400 MG tablet Take 1 tablet (400 mg total) by mouth 2 (two) times daily. 10/25/16  Yes Kefalas, Manon Hilding, PA-C  Bortezomib (VELCADE IJ) Inject as directed. Days 1, 4, 8, 11 every 21 days.   Yes [provider]  Bromfenac Sodium (PROLENSA) 0.07 % SOLN Apply 1 drop to  eye daily.   Yes [provider]  cinacalcet (SENSIPAR) 30 MG tablet Take 30 mg by mouth.   Yes [provider]  cyclophosphamide (CYTOXAN) 50 MG tablet Take 300 mg by mouth daily. Give on an empty stomach 1 hour before or 2 hours after meals.   Yes [provider]  cycloSPORINE (RESTASIS) 0.05 % ophthalmic emulsion Place 1 drop into both eyes 2 (two) times daily.   Yes [provider]  dexamethasone (DECADRON) 4 MG tablet Take 10 tablets (40 mg) on days 1, 8, and 15 of chemo. Repeat every 21 days. Take 2 tablets (47m) daily for 2 days starting after days 1 and 8 of chemotherapy. 10/25/16  Yes Kefalas, TManon Hilding PA-C  Difluprednate (DUREZOL) 0.05 % EMUL Place 1 drop into the right eye 6 (six) times daily.   Yes  [provider]  fexofenadine (ALLEGRA) 180 MG tablet Take 180 mg by mouth daily.   Yes [provider]  folic acid (FOLVITE) 1 MG tablet Take 1 mg by mouth daily.   Yes [provider]  gabapentin (NEURONTIN) 300 MG capsule Take 300 mg by mouth 3 (three) times daily.   Yes [provider]  guaiFENesin (MUCINEX) 600 MG 12 hr tablet Take 1 tablet (600 mg total) by mouth 2 (two) times daily. 12/06/16  Yes MKathie Dike MD  labetalol (NORMODYNE) 100 MG tablet Take 100 mg by mouth 2 (two) times daily.   Yes [provider]  lanthanum (FOSRENOL) 500 MG chewable tablet Chew 1,000 mg by mouth 3 (three) times daily with meals. And 1 with snacks   Yes [provider]  latanoprost (XALATAN) 0.005 % ophthalmic solution Place 1 drop into both eyes at bedtime. 07/13/14  Yes [provider]  levothyroxine (SYNTHROID, LEVOTHROID) 137 MCG tablet Take 137 mcg by mouth daily before breakfast.   Yes [provider]  megestrol (MEGACE) 20 MG tablet Take 1 tablet (20 mg total) by mouth 2 (two) times daily. 12/17/16  Yes ZTwana First MD  midodrine (PROAMATINE) 10 MG tablet Take 10 mg by mouth 2 (two) times daily.  10/26/16  Yes [provider]  minoxidil (LONITEN) 2.5 MG tablet Take 2.5 mg by mouth daily.   Yes [provider]  Multiple Vitamin (DAILY VITE) TABS Take 1 tablet by mouth daily.    Yes [provider]  ondansetron (ZOFRAN) 8 MG tablet Take 1 tablet (8 mg total) by mouth every 8 (eight) hours as needed for nausea or vomiting. 10/29/16  Yes Kefalas, TManon Hilding PA-C  prochlorperazine (COMPAZINE) 10 MG tablet Take 1 tablet (10 mg total) by mouth every 6 (six) hours as needed for nausea or vomiting. 10/29/16  Yes Kefalas, Thomas S, PA-C  SIMBRINZA 1-0.2 % SUSP INSTILL 1 DROP IN INTO BOTH EYES TWICE A DAY 01/03/15  Yes [provider]  simvastatin (ZOCOR) 20 MG tablet Take 20 mg by mouth daily with supper.     Yes  [provider]  ciprofloxacin (CIPRO) 500 MG tablet Take 1 tablet (500 mg total) by mouth daily with breakfast. Patient not taking: Reported on 12/24/2016 12/06/16   MKathie Dike MD  metroNIDAZOLE (FLAGYL) 500 MG tablet Take 1 tablet (500 mg total) by mouth 3 (three) times daily. Patient not taking: Reported on 12/24/2016 12/06/16   MKathie Dike MD    Family History Family History  Problem Relation Age of Onset  . Other Mother        Deceased, 815 .  Stomach cancer Father        Deceased, 5  . Kidney failure Son   . Healthy Son     Social History Social History  Substance Use Topics  . Smoking status: Never Smoker  . Smokeless tobacco: Never Used  . Alcohol use No     Allergies   Other   Review of Systems Review of Systems  Neurological: Positive for weakness.  ROS: Statement: All systems negative except as marked or noted in the HPI; Constitutional: Negative for fever and chills. +generalized weakness.; ; Eyes: Negative for eye pain, redness and discharge. ; ; ENMT: Negative for ear pain, hoarseness, nasal congestion, sinus pressure and sore throat. ; ; Cardiovascular: Negative for chest pain, palpitations, diaphoresis, dyspnea and peripheral edema. ; ; Respiratory: Negative for cough, wheezing and stridor. ; ; Gastrointestinal: +N/V/D. Negative for abdominal pain, blood in stool, hematemesis, jaundice and rectal bleeding. . ; ; Genitourinary: Negative for dysuria, flank pain and hematuria. ; ; Musculoskeletal: Negative for back pain and neck pain. Negative for swelling and trauma.; ; Skin: Negative for pruritus, rash, abrasions, blisters, bruising and skin lesion.; ; Neuro: Negative for headache, lightheadedness and neck stiffness. Negative for altered level of consciousness, altered mental status, extremity weakness, paresthesias, involuntary movement, seizure and syncope.      Physical Exam Updated Vital Signs BP 91/73   Pulse 80   Temp 97.4 F (36.3 C)  (Oral)   Resp 10   Ht 6' (1.829 m)   Wt 68 kg (150 lb)   SpO2 100%   BMI 20.34 kg/m    12:45 Orthostatic Vital Signs BC  Orthostatic Lying   BP- Lying: 92/72  Pulse- Lying: 73      Orthostatic Sitting  BP- Sitting:  81/63  Pulse- Sitting: 74      Orthostatic Standing at 0 minutes  BP- Standing at 0 minutes:  76/47  Pulse- Standing at 0 minutes: 75    Patient Vitals for the past 24 hrs:  BP Temp Temp src Pulse Resp SpO2 Height Weight  12/24/16 1330 91/73 - - 80 10 100 % - -  12/24/16 1300 112/77 - - 74 14 97 % - -  12/24/16 1230 101/79 - - 77 13 98 % - -  12/24/16 1200 98/71 - - 77 13 99 % - -  12/24/16 1130 98/67 - - 78 15 96 % - -  12/24/16 1109 101/68 - - - - - - -  12/24/16 1104 (!) 88/64 97.4 F (36.3 C) Oral 74 18 94 % - -  12/24/16 1102 - - - - - - 6' (1.829 m) 68 kg (150 lb)     Physical Exam 1215: Physical examination:  Nursing notes reviewed; Vital signs and O2 SAT reviewed;  Constitutional: Thin, In no acute distress; Head:  Normocephalic, atraumatic; Eyes: EOMI, PERRL, No scleral icterus; ENMT: Mouth and pharynx normal, Mucous membranes dry; Neck: Supple, Full range of motion, No lymphadenopathy; Cardiovascular: Irregular rate and rhythm, No gallop; Respiratory: Breath sounds clear & equal bilaterally, No wheezes.  Speaking full sentences with ease, Normal respiratory effort/excursion; Chest: Nontender, Movement normal; Abdomen: Soft, Nontender, Nondistended, Normal bowel sounds; Genitourinary: No CVA tenderness; Extremities: Pulses normal, No tenderness, No edema, No calf edema or asymmetry.; Neuro: AA&Ox3, Major CN grossly intact.  Speech clear. No gross focal motor or sensory deficits in extremities.; Skin: Color normal, Warm, Dry.   ED Treatments / Results  Labs (all labs ordered are listed, but only abnormal results are  displayed)   EKG  EKG Interpretation  Date/Time:  Tuesday Dec 24 2016 11:17:34 EDT Ventricular Rate:  74 PR Interval:    QRS  Duration: 97 QT Interval:  489 QTC Calculation: 543 R Axis:   -75 Text Interpretation:  Age not entered, assumed to be  66 years old for purpose of ECG interpretation Atrial fibrillation Left anterior fascicular block Anterior infarct, old Prolonged QT interval When compared with ECG of 12/02/2016 No significant change was found Confirmed by [facility]  MD, Nunzio Cory [phone]) on 12/24/2016 1:14:32 PM       Radiology   Procedures Procedures (including critical care time)  Medications Ordered in ED Medications  0.9 %  sodium chloride infusion ( Intravenous New Bag/Given 12/24/16 1344)  sodium chloride 0.9 % bolus 500 mL (500 mLs Intravenous New Bag/Given 12/24/16 1344)     Initial Impression / Assessment and Plan / ED Course  I have reviewed the triage vital signs and the nursing notes.  Pertinent labs & imaging results that were available during my care of the patient were reviewed by me and considered in my medical decision making (see chart for details).  MDM Reviewed: previous chart, nursing note and vitals Reviewed previous: labs and ECG Interpretation: labs, ECG and x-ray Total time providing critical care: 30-74 minutes. This excludes time spent performing separately reportable procedures and services. Consults: admitting MD   CRITICAL CARE Performed by: Alfonzo Feller Total critical care time: 35 minutes Critical care time was exclusive of separately billable procedures and treating other patients. Critical care was necessary to treat or prevent imminent or life-threatening deterioration. Critical care was time spent personally by me on the following activities: development of treatment plan with patient and/or surrogate as well as nursing, discussions with consultants, evaluation of patient's response to treatment, examination of patient, obtaining history from patient or surrogate, ordering and performing treatments and interventions, ordering and review of laboratory  studies, ordering and review of radiographic studies, pulse oximetry and re-evaluation of patient's condition.   Results for orders placed or performed during the hospital encounter of 12/24/16  Comprehensive metabolic panel  Result Value Ref Range   Sodium 139 135 - 145 mmol/L   Potassium 3.0 (L) 3.5 - 5.1 mmol/L   Chloride 95 (L) 101 - 111 mmol/L   CO2 26 22 - 32 mmol/L   Glucose, Bld 89 65 - 99 mg/dL   BUN 37 (H) 6 - 20 mg/dL   Creatinine, Ser 12.15 (H) 0.61 - 1.24 mg/dL   Calcium 7.4 (L) 8.9 - 10.3 mg/dL   Total Protein 6.9 6.5 - 8.1 g/dL   Albumin 3.2 (L) 3.5 - 5.0 g/dL   AST 23 15 - 41 U/L   ALT 12 (L) 17 - 63 U/L   Alkaline Phosphatase 60 38 - 126 U/L   Total Bilirubin 0.6 0.3 - 1.2 mg/dL   GFR calc non Af Amer 4 (L) >60 mL/min   GFR calc Af Amer 4 (L) >60 mL/min   Anion gap 18 (H) 5 - 15  Lipase, blood  Result Value Ref Range   Lipase 91 (H) 11 - 51 U/L  Lactic acid, plasma  Result Value Ref Range   Lactic Acid, Venous 1.5 0.5 - 1.9 mmol/L  Troponin I  Result Value Ref Range   Troponin I 0.05 (HH) <0.03 ng/mL  CBC with Differential  Result Value Ref Range   WBC 5.4 4.0 - 10.5 K/uL   RBC 3.83 (L) 4.22 - 5.81 MIL/uL  Hemoglobin 9.7 (L) 13.0 - 17.0 g/dL   HCT 29.5 (L) 39.0 - 52.0 %   MCV 77.0 (L) 78.0 - 100.0 fL   MCH 25.3 (L) 26.0 - 34.0 pg   MCHC 32.9 30.0 - 36.0 g/dL   RDW 14.8 11.5 - 15.5 %   Platelets 79 (L) 150 - 400 K/uL   Neutrophils Relative % 75 %   Neutro Abs 4.1 1.7 - 7.7 K/uL   Lymphocytes Relative 14 %   Lymphs Abs 0.7 0.7 - 4.0 K/uL   Monocytes Relative 11 %   Monocytes Absolute 0.6 0.1 - 1.0 K/uL   Eosinophils Relative 0 %   Eosinophils Absolute 0.0 0.0 - 0.7 K/uL   Basophils Relative 0 %   Basophils Absolute 0.0 0.0 - 0.1 K/uL  Magnesium  Result Value Ref Range   Magnesium 2.0 1.7 - 2.4 mg/dL    Dg Abd Acute W/chest Result Date: 12/24/2016 CLINICAL DATA:  Nausea vomiting diarrhea. Multiple myeloma. Dialysis patient. EXAM: DG ABDOMEN  ACUTE W/ 1V CHEST COMPARISON:  CT abdomen pelvis 12/02/2016 FINDINGS: Cardiac enlargement with mild vascular congestion. Negative for pulmonary edema or effusion. Negative for pneumonia Negative bowel gas pattern. Negative for bowel obstruction or ileus. No free air. Large calcific mass the left abdomen is unchanged consistent with calcified renal transplant. IMPRESSION: Cardiac enlargement with vascular congestion.  Negative for edema Negative bowel gas pattern. Electronically Signed   By: Franchot Gallo M.D.   On: 12/24/2016 13:27     1400:  Pt clinically dehydrated and orthostatic on VS; judicious IVF bolus and gtt started.   Dx and testing d/w pt and family.  Questions answered.  Verb understanding, agreeable to admit.  T/C to [facility] Dr. Adair Patter, case discussed, including:  HPI, pertinent PM/SHx, VS/PE, dx testing, ED course and treatment:  Agreeable to admit.    Final Clinical Impressions(s) / ED Diagnoses   Final diagnoses:  Dehydration  Nausea vomiting and diarrhea  Elevated troponin  ESRD on hemodialysis [facility])    New Prescriptions New Prescriptions   No medications on file      Francine Graven, DO 12/25/16 1114

## 2016-12-24 NOTE — ED Notes (Signed)
CRITICAL VALUE ALERT  Critical value received:  Troponin 0.05  Date of notification:  12/24/16  Time of notification:  1326  Critical value read back:Yes.    Nurse who received alert:  RMinter, RN  MD notified (1st page):  Dr. Thurnell Garbe  Time of first page:  1326  MD notified (2nd page):  Time of second page:  Responding MD:  Dr. Thurnell Garbe  Time MD responded:  [phone]

## 2016-12-24 NOTE — ED Triage Notes (Signed)
EMS reports pt has had decrased oral intake for the past week and reports generalized weakness.  EMS reports bp was 92/60, hr 61.  Pt says his last chemo treatment was Friday and was supposed to have it this morning.  Denies any pain.

## 2016-12-24 NOTE — Telephone Encounter (Signed)
pts brother called this morning and stated that he didn't realized Trestan had chemo scheduled yesterday bc his appts are usually on Tuesday and he has dialysis on Mondays.  He wanted to see if he could come today for chemo.  I put him on the schedule for 1045 labs then velcade after.  He also stated that that Jorgen had diarrhea and nausea with loss of appetite all weekend.  He did not want to go to the ER he want to wait until he came in for treatment to tell someone.  I explained that he needed to be using his nausea pills (both of them around the clock and to get imodium for the diarrhea and explained how to use that).  About an hour later the [facility] called back and stated that Marvelle was just to weak and he called EMS to come get him.  I told him that he needed to go to the ER to be evaluated.  I explained that Coral should not wait 2-3 days until his next treatment when he has had N/V/D and no appetite bc he will become dehydrated and that he is going to have to go to the ER to get evaluated.  He verbalized understanding.  Schedule adjusted accordingly and new calender made for pt.

## 2016-12-24 NOTE — ED Notes (Signed)
Report given to Magda Paganini, RN at this time for room 327.

## 2016-12-24 NOTE — H&P (Signed)
History and Physical    Brian Liu WOE:[id number] DOB: [birth year] DOA: 12/24/2016  PCP: [name], MD   Patient coming from: Home  Chief Complaint: Nausea, vomiting, weakness  HPI: Brian Liu is a 66 y.o. male with medical history significant of multiple myeloma currently undergoing chemotherapy,  HTN, mitral regurgitation and ESRD (on dialysis M, W, F) that presents to the ED after being directed there from the [place].  Per patient's son who is bedside, patient received chemotherapy at a slightly higher dose 4 days prior to admission.  He woke up the next morning after chemo and felt very nauseous and began vomiting.  Patient denies hematemesis.  Patient also felt very weak.  Per patient's son these symptoms persisted over the past three days.  Patient was so weak yesterday he could not go to dialysis.  Patient was trying to make it to his chemotherapy appointment today and did not want to make much of his symptoms.  His son called the cancer center today and was directed to come to the ED for further evaluation and treatment and his chemotherapy was rescheduled.  Patient also voices he has abdominal pain that is generalized and worse with coughing or hiccuping.  He denies anything makes the pain better.  He denies constipation and diarrhea.  Denies shortness of breath, chest pain, chest pressure, fevers or chills.  No sick contacts.    ED Course: Patient seen and evaluated.  He was started on IVF and his blood pressure improved from 88/64 to 116/73.  Labs were obtained and H/H of 9.7/29.5 (about baseline), platelets of 79 (decreased from a week ago but improved from 2 weeks ago), lipase of 91, Potassium of 3.0, Creatinine of 12.15, Calcium of 7.4.  Abdominal xray showing vascular congestion and cardiomegaly but otherwise negative.  TRH was asked to admit for dehydration.  Review of Systems: As per HPI otherwise 10 point review of systems negative.    Past Medical History:    Diagnosis Date  . Atrial fibrillation (HCC)    Permanent, Not on Coumadin because of history of lower GI bleed  . Diastolic dysfunction    Restrictive  diastolic filling pattern echo,  February, 2013  . Dyslipidemia   . Ejection fraction    EF 60-65%, echo, 2013  . ESRD (end stage renal disease) ([place])    Dialysis, fistula in left arm  . Hypertension   . Lower gastrointestinal bleed   . LVH (left ventricular hypertrophy)   . 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 Mitral regurgitation    Mild by echo, 2013  . Multiple myeloma (Brian Liu) 04/30/2016  . Pancytopenia (Brian Liu)    Dr Ignacia Bayley  . Pulmonary hypertension (HCC)    Severe, PA pressure 65-70 mm of mercury, echo, 2013  . Tobacco abuse     Past Surgical History:  Procedure Laterality Date  . AV FISTULA PLACEMENT Left 1992   Left arm   . 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 He has never used smokeless tobacco. He reports that he does not drink alcohol or use drugs.  Allergies  Allergen Reactions  . Other     Hydrocodone cough syrup makes him itch    Family History  Problem Relation Age of Onset  . Other Mother        Deceased, 86  . Stomach cancer Father  Deceased, 19  . Kidney failure Son   . Healthy Son      Prior to Admission medications   Medication Sig Start Date End Date Taking? Authorizing Provider  acyclovir (ZOVIRAX) 400 MG tablet Take 1 tablet (400 mg total) by mouth 2 (two) times daily. 10/25/16  Yes Kefalas, Manon Hilding, PA-C  Bortezomib (VELCADE IJ) Inject as directed. Days 1, 4, 8, 11 every 21 days.   Yes [provider]  Bromfenac Sodium (PROLENSA) 0.07 % SOLN Apply 1 drop to eye daily.   Yes [provider]  cinacalcet (SENSIPAR) 30 MG tablet Take 30 mg by mouth.   Yes [provider]  cyclophosphamide (CYTOXAN) 50 MG tablet Take 300 mg by mouth daily. Give on an  empty stomach 1 hour before or 2 hours after meals.   Yes [provider]  cycloSPORINE (RESTASIS) 0.05 % ophthalmic emulsion Place 1 drop into both eyes 2 (two) times daily.   Yes [provider]  dexamethasone (DECADRON) 4 MG tablet Take 10 tablets (40 mg) on days 1, 8, and 15 of chemo. Repeat every 21 days. Take 2 tablets (52m) daily for 2 days starting after days 1 and 8 of chemotherapy. 10/25/16  Yes Kefalas, TManon Hilding PA-C  Difluprednate (DUREZOL) 0.05 % EMUL Place 1 drop into the right eye 6 (six) times daily.   Yes [provider]  fexofenadine (ALLEGRA) 180 MG tablet Take 180 mg by mouth daily.   Yes [provider]  folic acid (FOLVITE) 1 MG tablet Take 1 mg by mouth daily.   Yes [provider]  gabapentin (NEURONTIN) 300 MG capsule Take 300 mg by mouth 3 (three) times daily.   Yes [provider]  guaiFENesin (MUCINEX) 600 MG 12 hr tablet Take 1 tablet (600 mg total) by mouth 2 (two) times daily. 12/06/16  Yes MKathie Dike MD  labetalol (NORMODYNE) 100 MG tablet Take 100 mg by mouth 2 (two) times daily.   Yes [provider]  lanthanum (FOSRENOL) 500 MG chewable tablet Chew 1,000 mg by mouth 3 (three) times daily with meals. And 1 with snacks   Yes [provider]  latanoprost (XALATAN) 0.005 % ophthalmic solution Place 1 drop into both eyes at bedtime. 07/13/14  Yes [provider]  levothyroxine (SYNTHROID, LEVOTHROID) 137 MCG tablet Take 137 mcg by mouth daily before breakfast.   Yes [provider]  megestrol (MEGACE) 20 MG tablet Take 1 tablet (20 mg total) by mouth 2 (two) times daily. 12/17/16  Yes ZTwana First MD  midodrine (PROAMATINE) 10 MG tablet Take 10 mg by mouth 2 (two) times daily.  10/26/16  Yes [provider]  minoxidil (LONITEN) 2.5 MG tablet Take 2.5 mg by mouth daily.   Yes [provider]  Multiple Vitamin (DAILY VITE) TABS Take 1 tablet by mouth daily.    Yes  [provider]  ondansetron (ZOFRAN) 8 MG tablet Take 1 tablet (8 mg total) by mouth every 8 (eight) hours as needed for nausea or vomiting. 10/29/16  Yes Kefalas, TManon Hilding PA-C  prochlorperazine (COMPAZINE) 10 MG tablet Take 1 tablet (10 mg total) by mouth every 6 (six) hours as needed for nausea or vomiting. 10/29/16  Yes Kefalas, Thomas S, PA-C  SIMBRINZA 1-0.2 % SUSP INSTILL 1 DROP IN INTO BOTH EYES TWICE A DAY 01/03/15  Yes [provider]  simvastatin (ZOCOR) 20 MG tablet Take 20 mg by mouth daily with supper.     Yes [provider]  ciprofloxacin (CIPRO) 500 MG tablet Take 1 tablet (500 mg total) by mouth daily with breakfast. Patient not taking: Reported on 12/24/2016 12/06/16   Kathie Dike, MD  metroNIDAZOLE (FLAGYL) 500 MG tablet Take 1 tablet (500 mg total) by mouth 3 (three) times daily. Patient not taking: Reported on 12/24/2016 12/06/16   Kathie Dike, MD    Physical Exam: Vitals:   12/24/16 1500 12/24/16 1530 12/24/16 1600 12/24/16 1653  BP: 118/85 103/72 107/84 116/73  Pulse: 75 76 76 83  Resp: '12 16 11 18  ' Temp:   97.4 F (36.3 C) 97.6 F (36.4 C)  TempSrc:   Oral Oral  SpO2: 100% 100% 100% 100%  Weight:    64.6 kg (142 lb 6.7 oz)  Height:    '6\' 1"'  (1.854 m)      Constitutional: NAD, calm, comfortable Vitals:   12/24/16 1500 12/24/16 1530 12/24/16 1600 12/24/16 1653  BP: 118/85 103/72 107/84 116/73  Pulse: 75 76 76 83  Resp: '12 16 11 18  ' Temp:   97.4 F (36.3 C) 97.6 F (36.4 C)  TempSrc:   Oral Oral  SpO2: 100% 100% 100% 100%  Weight:    64.6 kg (142 lb 6.7 oz)  Height:    '6\' 1"'  (1.854 m)   Eyes: PERRL, lids and conjunctivae normal ENMT: Mucous membranes are dry. Posterior pharynx clear of any exudate or lesions.Normal dentition.  Neck: normal, supple, no masses, no thyromegaly Respiratory: clear to auscultation bilaterally, no wheezing, no crackles. Normal respiratory effort. No accessory muscle use.  Cardiovascular: Regular  rate and rhythm, systolic murmur heard best in mitral area, no rubs / gallops. No extremity edema. 2+ pedal pulses. No carotid bruits.  Abdomen: minimal diffuse tenderness, no masses palpated. No hepatosplenomegaly. Bowel sounds positive.  Musculoskeletal: no clubbing / cyanosis. No joint deformity upper and lower extremities. Good ROM, no contractures. Normal muscle tone. Palpable thrill in left upper extremity at fistula Skin: no rashes, lesions, ulcers. No induration Neurologic: CN 2-12 grossly intact. Sensation intact, DTR normal. Strength 4/5 in all 4.  Psychiatric: Normal judgment and insight. Alert and oriented x 3. Normal mood.     Labs on Admission: I have personally reviewed following labs and imaging studies  CBC:  Recent Labs Lab 12/24/16 1228  WBC 5.4  NEUTROABS 4.1  HGB 9.7*  HCT 29.5*  MCV 77.0*  PLT 79*   Basic Metabolic Panel:  Recent Labs Lab 12/24/16 1228  NA 139  K 3.0*  CL 95*  CO2 26  GLUCOSE 89  BUN 37*  CREATININE 12.15*  CALCIUM 7.4*  MG 2.0   GFR: Estimated Creatinine Clearance: 5.6 mL/min (A) (by C-G formula based on SCr of 12.15 mg/dL (H)). Liver Function Tests:  Recent Labs Lab 12/24/16 1228  AST 23  ALT 12*  ALKPHOS 60  BILITOT 0.6  PROT 6.9  ALBUMIN 3.2*    Recent Labs Lab 12/24/16 1228  LIPASE 91*   No results for input(s): AMMONIA in the last 168 hours. Coagulation Profile: No results for input(s): INR, PROTIME in the last 168 hours. Cardiac Enzymes:  Recent Labs Lab 12/24/16 1228  TROPONINI 0.05*   BNP (last 3 results) No results for input(s): PROBNP in the last 8760 hours. HbA1C: No results for input(s): HGBA1C in the last 72 hours. CBG: No results for input(s): GLUCAP in the last 168 hours. Lipid Profile: No results for input(s): CHOL, HDL, LDLCALC, TRIG, CHOLHDL, LDLDIRECT in the last 72 hours. Thyroid Function Tests:  No results for input(s): TSH, T4TOTAL, FREET4, T3FREE, THYROIDAB in the last 72  hours. Anemia Panel: No results for input(s): VITAMINB12, FOLATE, FERRITIN, TIBC, IRON, RETICCTPCT in the last 72 hours. Urine analysis: No results found for: COLORURINE, APPEARANCEUR, LABSPEC, PHURINE, GLUCOSEU, HGBUR, BILIRUBINUR, KETONESUR, PROTEINUR, UROBILINOGEN, NITRITE, LEUKOCYTESUR Sepsis Labs: !!!!!!!!!!!!!!!!!!!!!!!!!!!!!!!!!!!!!!!!!!!! '@LABRCNTIP' (procalcitonin:4,lacticidven:4) )No results found for this or any previous visit (from the past 240 hour(s)).   Radiological Exams on Admission: Dg Abd Acute W/chest  Result Date: 12/24/2016 CLINICAL DATA:  Nausea vomiting diarrhea. Multiple myeloma. Dialysis patient. EXAM: DG ABDOMEN ACUTE W/ 1V CHEST COMPARISON:  CT abdomen pelvis 12/02/2016 FINDINGS: Cardiac enlargement with mild vascular congestion. Negative for pulmonary edema or effusion. Negative for pneumonia Negative bowel gas pattern. Negative for bowel obstruction or ileus. No free air. Large calcific mass the left abdomen is unchanged consistent with calcified renal transplant. IMPRESSION: Cardiac enlargement with vascular congestion.  Negative for edema Negative bowel gas pattern. Electronically Signed   By: Franchot Gallo M.D.   On: 12/24/2016 13:27    EKG: Independently reviewed. Atrial fibrillation with left anterior fascicular block  Assessment/Plan Principal Problem:   Dehydration Active Problems:   Mitral regurgitation   Atrial fibrillation (HCC)   Hypokalemia   ESRD on hemodialysis (HCC)   Nausea vomiting and diarrhea     Dehydration - given IVF in ED - continue IVF of 21m/hr for 10 hours to avoid volume overload - monitor BP  ESRD - nephrology consulted - patient is normally a M, W, F dialysis patient - repeat BMP in am  Atrial fibrillation - not on anticoagulation 2/2 GI bleed - telemetry - currently rate controlled  Hypokalemia - will correct with dialysis  Nausea and vomiting - likely worsened by increased chemo dose - concern for prolonged  QT on EKG - will need to be scrupulous with Zofran and Phenergan - give IV protonix - patient nausea could be worsened by skipping dialysis yesterday - telemetry- monitor for QT prolonging medications - restart home medications when patient tolerating PO    DVT prophylaxis: Heparin  Code Status:  Full  Family Communication: Son is bedside Disposition Plan: Likely discharge back to previous home environment when improved  Consults called: None  Admission status: Observation, telemetry   ALoretha StaplerMD [facility] Pager [phone] If 7PM-7AM, please contact night-coverage [email or web address] Password [email or web address] 12/24/2016, 6:11 PM

## 2016-12-25 ENCOUNTER — Observation Stay (HOSPITAL_COMMUNITY): Payer: Medicare Other

## 2016-12-25 DIAGNOSIS — C9000 Multiple myeloma not having achieved remission: Secondary | ICD-10-CM | POA: Diagnosis not present

## 2016-12-25 DIAGNOSIS — D631 Anemia in chronic kidney disease: Secondary | ICD-10-CM | POA: Diagnosis present

## 2016-12-25 DIAGNOSIS — R109 Unspecified abdominal pain: Secondary | ICD-10-CM | POA: Diagnosis not present

## 2016-12-25 DIAGNOSIS — R748 Abnormal levels of other serum enzymes: Secondary | ICD-10-CM | POA: Diagnosis not present

## 2016-12-25 DIAGNOSIS — I482 Chronic atrial fibrillation: Secondary | ICD-10-CM | POA: Diagnosis not present

## 2016-12-25 DIAGNOSIS — I120 Hypertensive chronic kidney disease with stage 5 chronic kidney disease or end stage renal disease: Secondary | ICD-10-CM | POA: Diagnosis present

## 2016-12-25 DIAGNOSIS — Z79899 Other long term (current) drug therapy: Secondary | ICD-10-CM | POA: Diagnosis not present

## 2016-12-25 DIAGNOSIS — M8580 Other specified disorders of bone density and structure, unspecified site: Secondary | ICD-10-CM | POA: Diagnosis present

## 2016-12-25 DIAGNOSIS — Z885 Allergy status to narcotic agent status: Secondary | ICD-10-CM | POA: Diagnosis not present

## 2016-12-25 DIAGNOSIS — Z992 Dependence on renal dialysis: Secondary | ICD-10-CM

## 2016-12-25 DIAGNOSIS — R197 Diarrhea, unspecified: Secondary | ICD-10-CM | POA: Diagnosis not present

## 2016-12-25 DIAGNOSIS — K529 Noninfective gastroenteritis and colitis, unspecified: Secondary | ICD-10-CM

## 2016-12-25 DIAGNOSIS — E785 Hyperlipidemia, unspecified: Secondary | ICD-10-CM | POA: Diagnosis present

## 2016-12-25 DIAGNOSIS — R778 Other specified abnormalities of plasma proteins: Secondary | ICD-10-CM

## 2016-12-25 DIAGNOSIS — I272 Pulmonary hypertension, unspecified: Secondary | ICD-10-CM | POA: Diagnosis present

## 2016-12-25 DIAGNOSIS — E860 Dehydration: Secondary | ICD-10-CM

## 2016-12-25 DIAGNOSIS — R7989 Other specified abnormal findings of blood chemistry: Secondary | ICD-10-CM

## 2016-12-25 DIAGNOSIS — N186 End stage renal disease: Secondary | ICD-10-CM

## 2016-12-25 DIAGNOSIS — R531 Weakness: Secondary | ICD-10-CM | POA: Diagnosis not present

## 2016-12-25 DIAGNOSIS — I340 Nonrheumatic mitral (valve) insufficiency: Secondary | ICD-10-CM | POA: Diagnosis present

## 2016-12-25 DIAGNOSIS — E876 Hypokalemia: Secondary | ICD-10-CM | POA: Diagnosis not present

## 2016-12-25 DIAGNOSIS — D472 Monoclonal gammopathy: Secondary | ICD-10-CM | POA: Diagnosis present

## 2016-12-25 DIAGNOSIS — T451X5A Adverse effect of antineoplastic and immunosuppressive drugs, initial encounter: Secondary | ICD-10-CM | POA: Diagnosis present

## 2016-12-25 DIAGNOSIS — G909 Disorder of the autonomic nervous system, unspecified: Secondary | ICD-10-CM | POA: Diagnosis present

## 2016-12-25 DIAGNOSIS — R112 Nausea with vomiting, unspecified: Principal | ICD-10-CM

## 2016-12-25 DIAGNOSIS — I9589 Other hypotension: Secondary | ICD-10-CM | POA: Diagnosis present

## 2016-12-25 DIAGNOSIS — D696 Thrombocytopenia, unspecified: Secondary | ICD-10-CM | POA: Diagnosis present

## 2016-12-25 DIAGNOSIS — Z66 Do not resuscitate: Secondary | ICD-10-CM | POA: Diagnosis present

## 2016-12-25 LAB — CBC WITH DIFFERENTIAL/PLATELET
Basophils Absolute: 0 10*3/uL (ref 0.0–0.1)
Basophils Relative: 0 %
Eosinophils Absolute: 0 10*3/uL (ref 0.0–0.7)
Eosinophils Relative: 1 %
HCT: 27.4 % — ABNORMAL LOW (ref 39.0–52.0)
Hemoglobin: 9.1 g/dL — ABNORMAL LOW (ref 13.0–17.0)
Lymphocytes Relative: 17 %
Lymphs Abs: 0.8 10*3/uL (ref 0.7–4.0)
MCH: 25.7 pg — ABNORMAL LOW (ref 26.0–34.0)
MCHC: 33.2 g/dL (ref 30.0–36.0)
MCV: 77.4 fL — ABNORMAL LOW (ref 78.0–100.0)
Monocytes Absolute: 0.6 10*3/uL (ref 0.1–1.0)
Monocytes Relative: 13 %
Neutro Abs: 3.2 10*3/uL (ref 1.7–7.7)
Neutrophils Relative %: 69 %
Platelets: 66 10*3/uL — ABNORMAL LOW (ref 150–400)
RBC: 3.54 MIL/uL — ABNORMAL LOW (ref 4.22–5.81)
RDW: 14.6 % (ref 11.5–15.5)
WBC: 4.6 10*3/uL (ref 4.0–10.5)

## 2016-12-25 LAB — BASIC METABOLIC PANEL
ANION GAP: 16 — AB (ref 5–15)
BUN: 39 mg/dL — ABNORMAL HIGH (ref 6–20)
CO2: 24 mmol/L (ref 22–32)
Calcium: 7.5 mg/dL — ABNORMAL LOW (ref 8.9–10.3)
Chloride: 102 mmol/L (ref 101–111)
Creatinine, Ser: 12.51 mg/dL — ABNORMAL HIGH (ref 0.61–1.24)
GFR calc Af Amer: 4 mL/min — ABNORMAL LOW (ref >60)
GFR, EST NON AFRICAN AMERICAN: 4 mL/min — AB (ref >60)
Glucose, Bld: 76 mg/dL (ref 65–99)
POTASSIUM: 3 mmol/L — AB (ref 3.5–5.1)
SODIUM: 142 mmol/L (ref 135–145)

## 2016-12-25 LAB — RENAL FUNCTION PANEL
Albumin: 2.8 g/dL — ABNORMAL LOW (ref 3.5–5.0)
Anion gap: 15 (ref 5–15)
BUN: 42 mg/dL — AB (ref 6–20)
CHLORIDE: 99 mmol/L — AB (ref 101–111)
CO2: 25 mmol/L (ref 22–32)
CREATININE: 12.7 mg/dL — AB (ref 0.61–1.24)
Calcium: 7.7 mg/dL — ABNORMAL LOW (ref 8.9–10.3)
GFR calc Af Amer: 4 mL/min — ABNORMAL LOW (ref >60)
GFR, EST NON AFRICAN AMERICAN: 4 mL/min — AB (ref >60)
Glucose, Bld: 86 mg/dL (ref 65–99)
Phosphorus: 7.3 mg/dL — ABNORMAL HIGH (ref 2.5–4.6)
Potassium: 3 mmol/L — ABNORMAL LOW (ref 3.5–5.1)
SODIUM: 139 mmol/L (ref 135–145)

## 2016-12-25 LAB — CBC
HCT: 27.4 % — ABNORMAL LOW (ref 39.0–52.0)
Hemoglobin: 8.9 g/dL — ABNORMAL LOW (ref 13.0–17.0)
MCH: 25.3 pg — AB (ref 26.0–34.0)
MCHC: 32.5 g/dL (ref 30.0–36.0)
MCV: 77.8 fL — ABNORMAL LOW (ref 78.0–100.0)
PLATELETS: 45 10*3/uL — AB (ref 150–400)
RBC: 3.52 MIL/uL — ABNORMAL LOW (ref 4.22–5.81)
RDW: 14.8 % (ref 11.5–15.5)
WBC: 5.2 10*3/uL (ref 4.0–10.5)

## 2016-12-25 LAB — TROPONIN I
TROPONIN I: 0.05 ng/mL — AB (ref <0.03)
Troponin I: 0.06 ng/mL (ref <0.03)

## 2016-12-25 MED ORDER — LEVOTHYROXINE SODIUM 25 MCG PO TABS
137.0000 ug | ORAL_TABLET | Freq: Every day | ORAL | Status: DC
  Administered 2016-12-26: 137 ug via ORAL
  Filled 2016-12-25: qty 1

## 2016-12-25 MED ORDER — SODIUM CHLORIDE 0.9 % IV SOLN: 100.0000 mL | INTRAVENOUS | Status: DC | PRN

## 2016-12-25 MED ORDER — MIDODRINE HCL 5 MG PO TABS
10.0000 mg | ORAL_TABLET | Freq: Two times a day (BID) | ORAL | Status: DC
  Administered 2016-12-25 – 2016-12-26 (×2): 10 mg via ORAL
  Filled 2016-12-25 (×2): qty 2

## 2016-12-25 MED ORDER — PENTAFLUOROPROP-TETRAFLUOROETH EX AERO: 1.0000 "application " | INHALATION_SPRAY | CUTANEOUS | Status: DC | PRN

## 2016-12-25 MED ORDER — LIDOCAINE-PRILOCAINE 2.5-2.5 % EX CREA
1.0000 | TOPICAL_CREAM | CUTANEOUS | Status: DC | PRN
Start: 2016-12-25 — End: 2016-12-26

## 2016-12-25 MED ORDER — ALTEPLASE 2 MG IJ SOLR
2.0000 mg | Freq: Once | INTRAMUSCULAR | Status: DC | PRN
  Filled 2016-12-25: qty 2

## 2016-12-25 MED ORDER — EPOETIN ALFA 4000 UNIT/ML IJ SOLN
INTRAMUSCULAR | Status: AC
  Administered 2016-12-25: 4000 [IU] via SUBCUTANEOUS
  Filled 2016-12-25: qty 1

## 2016-12-25 MED ORDER — ALBUMIN HUMAN 25 % IV SOLN
25.0000 g | Freq: Once | INTRAVENOUS | Status: AC
  Administered 2016-12-25: 25 g via INTRAVENOUS
  Filled 2016-12-25: qty 100

## 2016-12-25 MED ORDER — ACYCLOVIR 200 MG PO CAPS
200.0000 mg | ORAL_CAPSULE | Freq: Two times a day (BID) | ORAL | Status: DC
  Administered 2016-12-25 – 2016-12-26 (×2): 200 mg via ORAL
  Filled 2016-12-25 (×2): qty 1

## 2016-12-25 MED ORDER — IOPAMIDOL (ISOVUE-300) INJECTION 61%
INTRAVENOUS | Status: AC
  Filled 2016-12-25: qty 30

## 2016-12-25 MED ORDER — EPOETIN ALFA 4000 UNIT/ML IJ SOLN
4000.0000 [IU] | INTRAMUSCULAR | Status: DC
  Administered 2016-12-25: 4000 [IU] via SUBCUTANEOUS
  Filled 2016-12-25 (×2): qty 1

## 2016-12-25 MED ORDER — LIDOCAINE HCL (PF) 1 % IJ SOLN: 5.0000 mL | INTRAMUSCULAR | Status: DC | PRN

## 2016-12-25 MED ORDER — HEPARIN SODIUM (PORCINE) 1000 UNIT/ML DIALYSIS
1000.0000 [IU] | INTRAMUSCULAR | Status: DC | PRN
  Filled 2016-12-25: qty 1

## 2016-12-25 NOTE — Progress Notes (Signed)
PROGRESS NOTE  Brian Liu:[id number] DOB: [birth year] DOA: 12/24/2016 PCP: [name], MD  Brief History:  66 y.o. male with medical history significant of multiple myeloma currently undergoing chemotherapy,  HTN, mitral regurgitation and ESRD (on dialysis M, W, F) that presents to the ED after being directed there from the [place]. The patient last received chemotherapy on 12/20/2016. The patient woke up on 12/21/2016 with recurrent vomiting without hematemesis. Patient was so weak on 5/21 he could not go to dialysis, nor could the patient medicated to his chemotherapy on 12/24/2016.  His son called the cancer center today and was directed to come to the ED for further evaluation and treatment and his chemotherapy was rescheduled.  Patient also voices he has abdominal pain that is generalized  notably, the patient was admitted to the hospital from 12/02/2016 through 12/06/2016 with similar presentation. CT of the abdomen and pelvis at that time showed mild wall thickening of the small bowel concerning for enteritis versus anasarca.  Assessment/Plan: Dehydration -Symptomatically improved after IV fluids 10 hours -Secondary to intractable nausea and vomiting with decreased oral intake  Intractable nausea and vomiting -May be related to the patient's chemotherapy regimen -12/02/2016--CT of the abdomen and pelvis at that time showed mild wall thickening of the small bowel concerning for enteritis versus anasarca -Repeat CT abdomen and pelvis -Improving -continue PPI  ESRD - nephrology consulted - patient is normally a M, W, F dialysis patient - repeat BMP in am  Chronic Atrial fibrillation - not on anticoagulation 2/2 GI bleed - telemetry - currently rate controlled  Hypokalemia - will correct with dialysis  Elevated lipase -secondary to ESRD -does not have clinical syndrome consistent with pancreatitis  Generalized weakness -PT eval -secondary to  acute medical illness  Myeloma -Last received Velcade 12/21/16 -restart acyclovir  Thrombocytopenia -due to chemotherapy -monitor for signs of bleeding  Chronic hypotension. Likely related to autonomic dysfunction. Once he was restarted on home dose of Midodrine, blood pressure improved. He is otherwise asymptomatic.    Disposition Plan:   Home 5/24 or 5/25 Family Communication:   Son updated at bedside 5/24  Consultants:  renal  Code Status:  DNR  DVT Prophylaxis:  [place] Heparin / [place] Lovenox   Procedures: As Listed in Progress Note Above  Antibiotics: None    Subjective:   Objective: Vitals:   12/25/16 1530 12/25/16 1600 12/25/16 1630 12/25/16 1645  BP: (!) 88/55 102/64 92/65 111/63  Pulse: 74 77 74 80  Resp:    20  Temp:    98.7 F (37.1 C)  TempSrc:    Oral  SpO2:      Weight:      Height:        Intake/Output Summary (Last 24 hours) at 12/25/16 1811 Last data filed at 12/25/16 1645  Gross per 24 hour  Intake              870 ml  Output              500 ml  Net              370 ml   Weight change:  Exam:   General:  Pt is alert, follows commands appropriately, not in acute distress  HEENT: No icterus, No thrush, No neck mass, [place]/AT  Cardiovascular: IRRR, S1/S2, no rubs, no gallops  Respiratory: CTA bilaterally, no wheezing, no crackles, no rhonchi  Abdomen: Soft/+BS, non  tender, non distended, no guarding  Extremities: No edema, No lymphangitis, No petechiae, No rashes, no synovitis   Data Reviewed: I have personally reviewed following labs and imaging studies Basic Metabolic Panel:  Recent Labs Lab 12/24/16 1228 12/25/16 0508 12/25/16 1225  NA 139 142 139  K 3.0* 3.0* 3.0*  CL 95* 102 99*  CO2 '26 24 25  ' GLUCOSE 89 76 86  BUN 37* 39* 42*  CREATININE 12.15* 12.51* 12.70*  CALCIUM 7.4* 7.5* 7.7*  MG 2.0  --   --   PHOS  --   --  7.3*   Liver Function Tests:  Recent Labs Lab 12/24/16 1228 12/25/16 1225  AST 23  --   ALT  12*  --   ALKPHOS 60  --   BILITOT 0.6  --   PROT 6.9  --   ALBUMIN 3.2* 2.8*    Recent Labs Lab 12/24/16 1228  LIPASE 91*   No results for input(s): AMMONIA in the last 168 hours. Coagulation Profile: No results for input(s): INR, PROTIME in the last 168 hours. CBC:  Recent Labs Lab 12/24/16 1228 12/25/16 0508 12/25/16 1222  WBC 5.4 4.6 5.2  NEUTROABS 4.1 3.2  --   HGB 9.7* 9.1* 8.9*  HCT 29.5* 27.4* 27.4*  MCV 77.0* 77.4* 77.8*  PLT 79* 66* 45*   Cardiac Enzymes:  Recent Labs Lab 12/24/16 1228 12/24/16 1726 12/24/16 2254 12/25/16 0508  TROPONINI 0.05* 0.06* 0.06* 0.05*   BNP: Invalid input(s): POCBNP CBG: No results for input(s): GLUCAP in the last 168 hours. HbA1C: No results for input(s): HGBA1C in the last 72 hours. Urine analysis: No results found for: COLORURINE, APPEARANCEUR, LABSPEC, PHURINE, GLUCOSEU, HGBUR, BILIRUBINUR, KETONESUR, PROTEINUR, UROBILINOGEN, NITRITE, LEUKOCYTESUR Sepsis Labs: '@LABRCNTIP' (procalcitonin:4,lacticidven:4) )No results found for this or any previous visit (from the past 240 hour(s)).   Scheduled Meds: . acyclovir  200 mg Oral BID  . epoetin (EPOGEN/PROCRIT) injection  4,000 Units Subcutaneous Q M,W,F-HD  . [START ON 12/26/2016] levothyroxine  137 mcg Oral QAC breakfast  . midodrine  10 mg Oral BID WC  . pantoprazole (PROTONIX) IV  40 mg Intravenous QHS   Continuous Infusions: . sodium chloride    . sodium chloride      Procedures/Studies: Dg Chest 2 View  Result Date: 12/05/2016 CLINICAL DATA:  Fever. EXAM: CHEST  2 VIEW COMPARISON:  Radiographs of February 13, 2015. FINDINGS: Stable cardiomediastinal silhouette and mild central pulmonary vascular congestion. Atherosclerosis of thoracic aorta is noted. Status post right shoulder arthroplasty. No pneumothorax or pleural effusion is noted. No acute pulmonary disease is noted. IMPRESSION: No active cardiopulmonary disease.  Aortic atherosclerosis. Electronically Signed   By:  Marijo Conception, M.D.   On: 12/05/2016 16:52   US Abdomen Complete  Result Date: 12/02/2016 CLINICAL DATA:  66 year old male with history of right upper quadrant abdominal pain for 1 day. EXAM: ABDOMEN ULTRASOUND COMPLETE COMPARISON:  No priors.  CT of abdomen and pelvis 12/02/2016. FINDINGS: Gallbladder: Small echogenic foci without significant posterior acoustic shadowing, compatible with the small gallstones noted on recent CT examination. Gallbladder is only moderately distended. Gallbladder wall thickness is normal (2.1 mm). No pericholecystic fluid. Per report from the sonographer, the patient did not exhibit a sonographic Murphy's sign on examination. Common bile duct: Diameter: 5.5 mm Liver: Heterogeneous echotexture throughout the hepatic parenchyma, without discrete hepatic lesions. No intra hepatic biliary ductal dilatation. Normal hepatopetal flow in the portal vein. IVC: No abnormality visualized. Pancreas: Visualized portion unremarkable. Spleen: Normal in size.  2.1 x 2.3 x 1.9 cm anechoic to hypoechoic lesion with a few internal septations, increase through transmission and no internal blood flow, indeterminate, but favored to represent a minimally complex cyst. Notably, a lesion of this size has been present on prior CT examinations dating back to 2013, presumably benign. Right Kidney: Length: 11.3 cm. Diffuse cortical thinning and diffusely increased cortical echogenicity, indicative of underlying medical renal disease. No hydronephrosis visualized. Multiple anechoic lesions with increased through transmission, compatible with simple cysts, measuring up to 2.8 x 2.8 x 2.9 cm in the upper pole. Innumerable other smaller lesions range from anechoic to hypoechoic, incompletely characterized, but favored to represent small cysts of varying degrees of complexity. Left Kidney: Length: 7.3 cm. Diffuse cortical thinning and diffusely increased cortical echogenicity, indicative of underlying medical  renal disease. No hydronephrosis visualized. Multiple anechoic lesions with increased through transmission, compatible with simple cysts, measuring up to 1.2 x 1.2 x 1.2 cm in the interpolar region. Innumerable other smaller lesions range from anechoic to hypoechoic, incompletely characterized, but favored to represent small cysts of varying degrees of complexity. Abdominal aorta: No aneurysm visualized. Other findings: None. IMPRESSION: 1. Severe cortical thinning in the kidneys bilaterally, with diffuse increased echogenicity throughout the renal parenchyma, suggestive of underlying medical renal disease. Associated with this, there are innumerable cystic lesions in the kidneys bilaterally, likely related to underlying renal disease. 2. Cholelithiasis without evidence of acute cholecystitis at this time. 3. Indeterminate lesion in the spleen which appears stable compared to numerous prior CT examinations going back several years, presumably a benign mildly complex cyst. Electronically Signed   By: Vinnie Langton M.D.   On: 12/02/2016 14:32   Dg Abd Acute W/chest  Result Date: 12/24/2016 CLINICAL DATA:  Nausea vomiting diarrhea. Multiple myeloma. Dialysis patient. EXAM: DG ABDOMEN ACUTE W/ 1V CHEST COMPARISON:  CT abdomen pelvis 12/02/2016 FINDINGS: Cardiac enlargement with mild vascular congestion. Negative for pulmonary edema or effusion. Negative for pneumonia Negative bowel gas pattern. Negative for bowel obstruction or ileus. No free air. Large calcific mass the left abdomen is unchanged consistent with calcified renal transplant. IMPRESSION: Cardiac enlargement with vascular congestion.  Negative for edema Negative bowel gas pattern. Electronically Signed   By: Franchot Gallo M.D.   On: 12/24/2016 13:27   Ct Renal Stone Study  Result Date: 12/02/2016 CLINICAL DATA:  Abdominal pain and vomiting. EXAM: CT ABDOMEN AND PELVIS WITHOUT CONTRAST TECHNIQUE: Multidetector CT imaging of the abdomen and pelvis  was performed following the standard protocol without IV contrast. COMPARISON:  10/01/2011 FINDINGS: Lower chest: No acute abnormality.  Cardiac enlargement noted. Hepatobiliary: No focal liver abnormality. Small stones are identified within the dependent portion of the gallbladder. Pancreas: Unremarkable. No pancreatic ductal dilatation or surrounding inflammatory changes. Spleen: No change in 2.5 cm low-attenuation structure within the spleen, image number 9 of series 2. Adrenals/Urinary Tract: The adrenal glands are normal. End-stage bilateral kidneys identified containing multiple cysts. Bilateral renal calculi are identified on the left these measure up to 5 mm. On the right these measure up to 5 mm. No hydronephrosis or hydroureter identified. No ureteral lithiasis. Calcified mass within the left lower quadrant of the abdomen compatible with chronic infarcted renal graft. Urinary bladder is unremarkable for degree of distention. Stomach/Bowel: The stomach appears normal. Left upper quadrant small bowel loops exhibit mild wall thickening. No abnormal dilatation of the small or large bowel loops. No evidence for obstruction. Vascular/Lymphatic: Aortic atherosclerosis. No aneurysm. No upper abdominal adenopathy. Reproductive: Prostate is unremarkable. Other:  No abdominal wall hernia or abnormality. No abdominopelvic ascites. Musculoskeletal: Changes of renal osteodystrophy identified. Multiple Schmorl node deformities are noted within the lumbar spine. IMPRESSION: 1. There is mild wall thickening involving left upper quadrant small bowel loops. Nonspecific in may reflect generalized anasarca secondary to renal failure. Cannot rule out enteritis. 2. Bilateral end-stage cystic kidneys. Renal calculi are noted bilaterally without evidence for hydronephrosis. 3. 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 4.  Aortic Atherosclerosis (ICD10-I70.0). 5. Gallstones .er Electronically Signed   By: Kerby Moors  M.D.   On: 12/02/2016 13:05    Kallum Jorgensen, DO  [facility] Pager [phone]  If 7PM-7AM, please contact night-coverage [email or web address] Password [email or web address] 12/25/2016, 6:11 PM   LOS: 0 days

## 2016-12-25 NOTE — Care Management Note (Signed)
Case Management Note  Patient Details  Name: GENEROSO CROPPER MRN: [id number] Date of Birth: Aug 01, 1951  Subjective/Objective:                  Pt lives at home with son Marcello Moores. Son Chima at bedside. Pt sleeping, information provided by son. Pt is ind at baseline. He uses a cane. He has a walker if he needs it. His sons drive him to appointments. He is on HD MWF and receiving chemo which, per son, has made him very week.   Action/Plan: Sons plan for pt to return home at DC. CM will cont to follow for DC needs.   Expected Discharge Date:  12/26/16               Expected Discharge Plan:  Home/Self Care  In-House Referral:  NA  Discharge planning Services  CM Consult  Post Acute Care Choice:  NA Choice offered to:  NA  Status of Service:  In process, will continue to follow Sherald Barge, RN 12/25/2016, 10:01 AM

## 2016-12-25 NOTE — Consult Note (Signed)
Brian Liu MRN: [id number] DOB/AGE: [birth year] 66 y.o. Primary Care Physician:Vyas, Costella Hatcher, MD Admit date: 12/24/2016 Chief Complaint:  Chief Complaint  Patient presents with  . Weakness   HPI: Pt is a 66 year old  male with past medical history significant of multiple myeloma currently undergoing chemotherapy, ESRD (on dialysis M, W, F) who  Presented  to the ED after being directed there from the [facility].    HPI dates back to past few days when patient started receiving chemotherapy at  higher doses and started complaining of nausea and emesis. NO c/o hematemesis.  Patient later also c/o feeling weak. Slowly progressive over past three-four days.  Patient was so weak yesterday he could not go to dialysis.   Pt was directed to come to the ED for further evaluation and treatment and his chemotherapy was  rescheduled.   Patient also c/o abdominal pain , generalized and worse with coughing or hiccuping.    Pt offer no exacerbating /allevating better. Pt offers no c/o constipation and diarrhea.   NO c/o  shortness of breath. chest pain, chest pressure, fevers or chills.  No sick contacts.   Upon evaluation in ER .Pt was hypotensive at the time of presentation with SBP 88/64 to 116/73.   Pt was admitted for further care. Pt seen on 3rd floor. Pt main concern is " I am still weak"   Past Medical History:  Diagnosis Date  . Atrial fibrillation (HCC)    Permanent, Not on Coumadin because of history of lower GI bleed  . Diastolic dysfunction    Restrictive  diastolic filling pattern echo,  February, 2013  . Dyslipidemia   . Ejection fraction    EF 60-65%, echo, 2013  . ESRD (end stage renal disease) ([place])    Dialysis, fistula in left arm  . Hypertension   . Lower gastrointestinal bleed   . LVH (left ventricular hypertrophy)   . 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 Mitral regurgitation    Mild by echo, 2013  . Multiple myeloma ([place]) 04/30/2016   . Pancytopenia ([place])    Dr Ignacia Bayley  . Pulmonary hypertension (HCC)    Severe, PA pressure 65-70 mm of mercury, echo, 2013  . Tobacco abuse         Family History  Problem Relation Age of Onset  . Other Mother        Deceased, 46  . Stomach cancer Father        Deceased, 31  . Kidney failure Son   . Healthy Son     Social History:  reports that he has never smoked. He has never used smokeless tobacco. He reports that he does not drink alcohol or use drugs.   Allergies:  Allergies  Allergen Reactions  . Other     Hydrocodone cough syrup makes him itch    Medications Prior to Admission  Medication Sig Dispense Refill  . acyclovir (ZOVIRAX) 400 MG tablet Take 1 tablet (400 mg total) by mouth 2 (two) times daily. 60 tablet 3  . Bortezomib (VELCADE IJ) Inject as directed. Days 1, 4, 8, 11 every 21 days.    . Bromfenac Sodium (PROLENSA) 0.07 % SOLN Apply 1 drop to eye daily.    . cinacalcet (SENSIPAR) 30 MG tablet Take 30 mg by mouth.    . cyclophosphamide (CYTOXAN) 50 MG tablet Take 300 mg by mouth daily. Give on an empty stomach 1 hour before or 2 hours after meals.    [name]  cycloSPORINE (RESTASIS) 0.05 % ophthalmic emulsion Place 1 drop into both eyes 2 (two) times daily.    [name] dexamethasone (DECADRON) 4 MG tablet Take 10 tablets (40 mg) on days 1, 8, and 15 of chemo. Repeat every 21 days. Take 2 tablets (19m) daily for 2 days starting after days 1 and 8 of chemotherapy. 40 tablet 3  . Difluprednate (DUREZOL) 0.05 % EMUL Place 1 drop into the right eye 6 (six) times daily.    . fexofenadine (ALLEGRA) 180 MG tablet Take 180 mg by mouth daily.    . folic acid (FOLVITE) 1 MG tablet Take 1 mg by mouth daily.    .Marland Kitchengabapentin (NEURONTIN) 300 MG capsule Take 300 mg by mouth 3 (three) times daily.    .Marland KitchenguaiFENesin (MUCINEX) 600 MG 12 hr tablet Take 1 tablet (600 mg total) by mouth 2 (two) times daily. 30 tablet 0  . labetalol (NORMODYNE) 100 MG tablet Take 100 mg by mouth 2 (two) times  daily.    .Marland Kitchenlanthanum (FOSRENOL) 500 MG chewable tablet Chew 1,000 mg by mouth 3 (three) times daily with meals. And 1 with snacks    . latanoprost (XALATAN) 0.005 % ophthalmic solution Place 1 drop into both eyes at bedtime.  6  . levothyroxine (SYNTHROID, LEVOTHROID) 137 MCG tablet Take 137 mcg by mouth daily before breakfast.    . megestrol (MEGACE) 20 MG tablet Take 1 tablet (20 mg total) by mouth 2 (two) times daily. 60 tablet 2  . midodrine (PROAMATINE) 10 MG tablet Take 10 mg by mouth 2 (two) times daily.     . minoxidil (LONITEN) 2.5 MG tablet Take 2.5 mg by mouth daily.    . Multiple Vitamin (DAILY VITE) TABS Take 1 tablet by mouth daily.     . ondansetron (ZOFRAN) 8 MG tablet Take 1 tablet (8 mg total) by mouth every 8 (eight) hours as needed for nausea or vomiting. 30 tablet 2  . prochlorperazine (COMPAZINE) 10 MG tablet Take 1 tablet (10 mg total) by mouth every 6 (six) hours as needed for nausea or vomiting. 30 tablet 2  . SIMBRINZA 1-0.2 % SUSP INSTILL 1 DROP IN INTO BOTH EYES TWICE A DAY  6  . simvastatin (ZOCOR) 20 MG tablet Take 20 mg by mouth daily with supper.      . ciprofloxacin (CIPRO) 500 MG tablet Take 1 tablet (500 mg total) by mouth daily with breakfast. (Patient not taking: Reported on 12/24/2016) 4 tablet 0  . metroNIDAZOLE (FLAGYL) 500 MG tablet Take 1 tablet (500 mg total) by mouth 3 (three) times daily. (Patient not taking: Reported on 12/24/2016) 12 tablet 0       RPQZ:RAQTMfrom the symptoms mentioned above,there are no other symptoms referable to all systems reviewed.  . pantoprazole (PROTONIX) IV  40 mg Intravenous QHS       Physical Exam: Vital signs in last 24 hours: Temp:  [97.4 F (36.3 C)-98.4 F (36.9 C)] 98.4 F (36.9 C) (05/22 2046) Pulse Rate:  [74-83] 75 (05/22 2046) Resp:  [10-18] 16 (05/22 2046) BP: (88-118)/(59-85) 95/59 (05/22 2046) SpO2:  [94 %-100 %] 98 % (05/22 2046) Weight:  [142 lb 6.7 oz (64.6 kg)-150 lb (68 kg)] 142 lb 6.7 oz  (64.6 kg) (05/22 1653) Weight change:  Last BM Date: 12/22/16  Intake/Output from previous day: 05/22 0701 - 05/23 0700 In: 1250 [I.V.:750; IV Piggyback:500] Out: -  Total I/O In: 750 [I.V.:750] Out: -    Physical Exam: General- pt  is awake,alert, cachectic Resp- No acute REsp distress, CTA B/L NO Rhonchi CVS- S1S2 regular in rate and rhythm GIT- BS+, soft, NT, ND EXT- NO LE Edema, Cyanosis CNS- CN 2-12 grossly intact. Moving all 4 extremities Access-  AVF   Lab Results: CBC  Recent Labs  12/24/16 1228  WBC 5.4  HGB 9.7*  HCT 29.5*  PLT 79*    BMET  Recent Labs  12/24/16 1228  NA 139  K 3.0*  CL 95*  CO2 26  GLUCOSE 89  BUN 37*  CREATININE 12.15*  CALCIUM 7.4*    MICRO No results found for this or any previous visit (from the past 240 hour(s)).    Lab Results  Component Value Date   CALCIUM 7.4 (L) 12/24/2016   PHOS 4.7 (H) 12/06/2016      Impression: 1)Renal  ESRD on HD                 On Monday/Wednesday/Friday schedule                 Missed Hd on Monday                 Will dialyze today  2)CVS stable-admitted with hypotension, now better   3)Anemia In ESRD the goal for HGb is 9--11. HGb is at goal Multiple myeloma also contributing to Anemia   4)CKD Mineral-Bone Disorder Secondary Hyperparathyroidsim present On cinaclcet as outpt  Phosphorus at goal.   5)Oncology-hx of multiple myeloma Primary  MD following  6)Electrolytes Normokalemic NOrmonatremic   7)Acid base Co2 at goal     Plan:  Will dialyze today Will use 3k/4 k bath will keep on epo Will not take any fluid  Off as not much intake or any edema      Odel Schmid S 12/25/2016, 5:49 AM

## 2016-12-25 NOTE — Care Management Obs Status (Signed)
[facility] NOTIFICATION   Patient Details  Name: Brian Liu MRN: [id number] Date of Birth: 11-02-1950   Medicare Observation Status Notification Given:  Yes    Sherald Barge, RN 12/25/2016, 10:00 AM

## 2016-12-26 LAB — CBC
HCT: 26.5 % — ABNORMAL LOW (ref 39.0–52.0)
HEMOGLOBIN: 8.7 g/dL — AB (ref 13.0–17.0)
MCH: 25.6 pg — ABNORMAL LOW (ref 26.0–34.0)
MCHC: 32.8 g/dL (ref 30.0–36.0)
MCV: 77.9 fL — ABNORMAL LOW (ref 78.0–100.0)
Platelets: 46 10*3/uL — ABNORMAL LOW (ref 150–400)
RBC: 3.4 MIL/uL — ABNORMAL LOW (ref 4.22–5.81)
RDW: 14.6 % (ref 11.5–15.5)
WBC: 4.9 10*3/uL (ref 4.0–10.5)

## 2016-12-26 LAB — FOLATE: FOLATE: 17.3 ng/mL (ref >5.9)

## 2016-12-26 LAB — VITAMIN B12: VITAMIN B 12: 929 pg/mL — AB (ref 180–914)

## 2016-12-26 MED ORDER — SEVELAMER CARBONATE 800 MG PO TABS
1600.0000 mg | ORAL_TABLET | Freq: Three times a day (TID) | ORAL | Status: DC
  Administered 2016-12-26: 1600 mg via ORAL
  Filled 2016-12-26: qty 2

## 2016-12-26 NOTE — Care Management (Signed)
    Durable Medical Equipment        Start     Ordered   12/26/16 1118  For home use only DME Walker rolling  Once    Question:  Patient needs a walker to treat with the following condition  Answer:  Generalized weakness   12/26/16 1117

## 2016-12-26 NOTE — Progress Notes (Addendum)
Subjective: Interval History: has no complaint of nausea or vomiting. His feeling weak but is better today. He denies any nausea or vomiting..  Objective: Vital signs in last 24 hours: Temp:  [98 F (36.7 C)-98.7 F (37.1 C)] 98 F (36.7 C) (05/24 0559) Pulse Rate:  [71-87] 73 (05/24 0559) Resp:  [18-20] 18 (05/24 0559) BP: (68-111)/(39-77) 95/61 (05/24 0559) SpO2:  [98 %-100 %] 99 % (05/24 0559) Weight:  [67.8 kg (149 lb 7.6 oz)] 67.8 kg (149 lb 7.6 oz) (05/23 1300) Weight change: -0.24 kg (-8.5 oz)  Intake/Output from previous day: 05/23 0701 - 05/24 0700 In: 120 [P.O.:120] Out: 500  Intake/Output this shift: No intake/output data recorded.  General appearance: alert, cooperative and no distress Resp: clear to auscultation bilaterally Cardio: regular rate and rhythm Extremities: No edema  Lab Results:  Recent Labs  12/25/16 1222 12/26/16 0608  WBC 5.2 4.9  HGB 8.9* 8.7*  HCT 27.4* 26.5*  PLT 45* 46*   BMET:  Recent Labs  12/25/16 0508 12/25/16 1225  NA 142 139  K 3.0* 3.0*  CL 102 99*  CO2 24 25  GLUCOSE 76 86  BUN 39* 42*  CREATININE 12.51* 12.70*  CALCIUM 7.5* 7.7*   No results for input(s): PTH in the last 72 hours. Iron Studies: No results for input(s): IRON, TIBC, TRANSFERRIN, FERRITIN in the last 72 hours.  Studies/Results: Dg Abd Acute W/chest  Result Date: 12/24/2016 CLINICAL DATA:  Nausea vomiting diarrhea. Multiple myeloma. Dialysis patient. EXAM: DG ABDOMEN ACUTE W/ 1V CHEST COMPARISON:  CT abdomen pelvis 12/02/2016 FINDINGS: Cardiac enlargement with mild vascular congestion. Negative for pulmonary edema or effusion. Negative for pneumonia Negative bowel gas pattern. Negative for bowel obstruction or ileus. No free air. Large calcific mass the left abdomen is unchanged consistent with calcified renal transplant. IMPRESSION: Cardiac enlargement with vascular congestion.  Negative for edema Negative bowel gas pattern. Electronically Signed   By:  Franchot Gallo M.D.   On: 12/24/2016 13:27    I have reviewed the patient's current medications.  Assessment/Plan: Problem #1 generalized weakness: Presently feeling better. This could be secondary to hypokalemia and anemia. Problem #2 end-stage renal disease: He is status post hemodialysis yesterday. Patient does not have any uremic signs and symptoms. 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 Patient is on chemotherapy and being followed by oncology. Problem #4 anemia: Most likely secondary to chronic renal failure. His hemoglobin is below our target goal:. Problem #5 metabolic and Bone disorder: Patient calcium is range but phosphorus is high. Problem #6 fluid management: No sign of fluid overload Problem #7 status post failed cadaver transplant Plan: Patient does require dialysis today. 2] will make arrangement for him to get dialysis tomorrow which is his regular schedule. 3] will start patient on Renvela 800 mg 2 tablets by mouth 3 times a day with meals 4] will check his renal panel and CBC in the morning.   LOS: 1 day   Dorothy Landgrebe S 12/26/2016,8:29 AM

## 2016-12-26 NOTE — Progress Notes (Signed)
Brian Liu discharged Home per MD order.  Discharge instructions reviewed and discussed with the patient, all questions and concerns answered. Copy of instructions and scripts given to patient.  Allergies as of 12/26/2016      Reactions   Other    Hydrocodone cough syrup makes him itch      Medication List    STOP taking these medications   ciprofloxacin 500 MG tablet Commonly known as:  CIPRO   guaiFENesin 600 MG 12 hr tablet Commonly known as:  MUCINEX   labetalol 100 MG tablet Commonly known as:  NORMODYNE   metroNIDAZOLE 500 MG tablet Commonly known as:  FLAGYL   minoxidil 2.5 MG tablet Commonly known as:  LONITEN     TAKE these medications   acyclovir 400 MG tablet Commonly known as:  ZOVIRAX Take 1 tablet (400 mg total) by mouth 2 (two) times daily.   cinacalcet 30 MG tablet Commonly known as:  SENSIPAR Take 30 mg by mouth.   cyclophosphamide 50 MG tablet Commonly known as:  CYTOXAN Take 300 mg by mouth daily. Give on an empty stomach 1 hour before or 2 hours after meals.   cycloSPORINE 0.05 % ophthalmic emulsion Commonly known as:  RESTASIS Place 1 drop into both eyes 2 (two) times daily.   DAILY VITE Tabs Take 1 tablet by mouth daily.   dexamethasone 4 MG tablet Commonly known as:  DECADRON Take 10 tablets (40 mg) on days 1, 8, and 15 of chemo. Repeat every 21 days. Take 2 tablets (8mg ) daily for 2 days starting after days 1 and 8 of chemotherapy.   DUREZOL 0.05 % Emul Generic drug:  Difluprednate Place 1 drop into the right eye 6 (six) times daily.   fexofenadine 180 MG tablet Commonly known as:  ALLEGRA Take 180 mg by mouth daily.   folic acid 1 MG tablet Commonly known as:  FOLVITE Take 1 mg by mouth daily.   gabapentin 300 MG capsule Commonly known as:  NEURONTIN Take 300 mg by mouth 3 (three) times daily.   lanthanum 500 MG chewable tablet Commonly known as:  FOSRENOL Chew 1,000 mg by mouth 3 (three) times daily with meals. And 1  with snacks   latanoprost 0.005 % ophthalmic solution Commonly known as:  XALATAN Place 1 drop into both eyes at bedtime.   levothyroxine 137 MCG tablet Commonly known as:  SYNTHROID, LEVOTHROID Take 137 mcg by mouth daily before breakfast.   megestrol 20 MG tablet Commonly known as:  MEGACE Take 1 tablet (20 mg total) by mouth 2 (two) times daily.   midodrine 10 MG tablet Commonly known as:  PROAMATINE Take 10 mg by mouth 2 (two) times daily.   ondansetron 8 MG tablet Commonly known as:  ZOFRAN Take 1 tablet (8 mg total) by mouth every 8 (eight) hours as needed for nausea or vomiting.   prochlorperazine 10 MG tablet Commonly known as:  COMPAZINE Take 1 tablet (10 mg total) by mouth every 6 (six) hours as needed for nausea or vomiting.   PROLENSA 0.07 % Soln Generic drug:  Bromfenac Sodium Apply 1 drop to eye daily.   SIMBRINZA 1-0.2 % Susp Generic drug:  Brinzolamide-Brimonidine INSTILL 1 DROP IN INTO BOTH EYES TWICE A DAY   simvastatin 20 MG tablet Commonly known as:  ZOCOR Take 20 mg by mouth daily with supper.   VELCADE IJ Inject as directed. Days 1, 4, 8, 11 every 21 days.  Durable Medical Equipment        Start     Ordered   12/26/16 1146  For home use only DME standard manual wheelchair with seat cushion  Once    Comments:  Patient suffers from deconditioning and myeloma which impairs their ability to perform daily activities like walking and bathing in the home.  A cane will not resolve  issue with performing activities of daily living. A wheelchair will allow patient to safely perform daily activities. Patient can safely propel the wheelchair in the home or has a caregiver who can provide assistance.  Accessories: elevating leg rests (ELRs), wheel locks, extensions and anti-tippers.   12/26/16 1146   12/26/16 1118  For home use only DME Walker rolling  Once    Question:  Patient needs a walker to treat with the following condition  Answer:   Generalized weakness   12/26/16 1117      Patients skin is clean, dry and intact, no evidence of skin break down. IV site discontinued and catheter remains intact. Site without signs and symptoms of complications. Dressing and pressure applied.  Patient escorted to car by NT in a wheelchair,  no distress noted upon discharge.  Brian Liu Brian Liu 12/26/2016 1:11 PM

## 2016-12-26 NOTE — Discharge Summary (Signed)
Physician Discharge Summary  Brian Liu TGG:[id number] DOB: 1951-02-18 DOA: 12/24/2016  PCP: [name], MD  Admit date: 12/24/2016 Discharge date: 12/26/2016  Admitted From: Home Disposition:  Home   Recommendations for Outpatient Follow-up:  1. Follow up with PCP in 1-2 weeks 2. Please obtain BMP/CBC in one week   Home Health: PT Equipment/Devices:walker and wheelchair  Discharge Condition: Stable CODE STATUS: FULL Diet recommendation: renal diet   Brief/Interim Summary: 66 y.o.malewith medical history significant of multiple myeloma currently undergoing chemotherapy, HTN, mitral regurgitation and ESRD (on dialysis M, W, F) that presents to the ED after being directed there from the [facility]. The patient last received chemotherapy on 12/20/2016. The patient woke up on 12/21/2016 with recurrent vomiting without hematemesis. Patient was so weak on 5/21 he could not go to dialysis, nor could the patient medicated to his chemotherapy on 12/24/2016. His son called the cancer center today and was directed to come to the ED for further evaluation and treatment and his chemotherapy was rescheduled. Patient also voices he has abdominal pain that is generalized  notably, the patient was admitted to the hospital from 12/02/2016 through 12/06/2016 with similar presentation. CT of the abdomen and pelvis at that time showed mild wall thickening of the small bowel concerning for enteritis versus anasarca.  Discharge Diagnoses:  Dehydration -Symptomatically improved after IV fluids 10 hours -Secondary to intractable nausea and vomiting with decreased oral intake  Intractable nausea and vomiting -May be related to the patient's chemotherapy regimen -12/02/2016--CT of the abdomen and pelvis at that time showed mild wall thickening of the small bowel concerning for enteritis versus anasarca -Repeat CT abdomen and pelvis--no small bowel or large bowel thickening, sigmoid  diverticulosis, cholelithiasis without cholecystitis -Improving--> patient's diet was advanced which he tolerated. -continue PPI During the hospitalization  ESRD - nephrology consulted--The patient received his usual maintenance dialysis on the 23rd 2018 - patient is normally a M, W, F dialysis patient  Chronic Atrial fibrillation - not on anticoagulation 2/2 GI bleed - telemetry - currently rate controlled  Hypokalemia - will correct with dialysis  Elevated lipase -secondary to ESRD -does not have clinical syndrome consistent with pancreatitis  Generalized weakness -PT eval-->HHPT -secondary to acute medical illness  Myeloma -Last received Velcade 12/21/16 -restarted acyclovir  Thrombocytopenia -due to chemotherapy -monitor for signs of bleeding  Chronic hypotension.  -Likely related to autonomic dysfunction. Once he was restarted on home dose of Midodrine, blood pressure improved. He is otherwise asymptomatic. -discontinue normodyne and minoxidil   Discharge Instructions  Discharge Instructions    Diet - low sodium heart healthy    Complete by:  As directed    Increase activity slowly    Complete by:  As directed      Allergies as of 12/26/2016      Reactions   Other    Hydrocodone cough syrup makes him itch      Medication List    STOP taking these medications   ciprofloxacin 500 MG tablet Commonly known as:  CIPRO   guaiFENesin 600 MG 12 hr tablet Commonly known as:  MUCINEX   labetalol 100 MG tablet Commonly known as:  NORMODYNE   metroNIDAZOLE 500 MG tablet Commonly known as:  FLAGYL   minoxidil 2.5 MG tablet Commonly known as:  LONITEN     TAKE these medications   acyclovir 400 MG tablet Commonly known as:  ZOVIRAX Take 1 tablet (400 mg total) by mouth 2 (two) times daily.   cinacalcet  30 MG tablet Commonly known as:  SENSIPAR Take 30 mg by mouth.   cyclophosphamide 50 MG tablet Commonly known as:  CYTOXAN Take 300 mg by  mouth daily. Give on an empty stomach 1 hour before or 2 hours after meals.   cycloSPORINE 0.05 % ophthalmic emulsion Commonly known as:  RESTASIS Place 1 drop into both eyes 2 (two) times daily.   DAILY VITE Tabs Take 1 tablet by mouth daily.   dexamethasone 4 MG tablet Commonly known as:  DECADRON Take 10 tablets (40 mg) on days 1, 8, and 15 of chemo. Repeat every 21 days. Take 2 tablets (79m) daily for 2 days starting after days 1 and 8 of chemotherapy.   DUREZOL 0.05 % Emul Generic drug:  Difluprednate Place 1 drop into the right eye 6 (six) times daily.   fexofenadine 180 MG tablet Commonly known as:  ALLEGRA Take 180 mg by mouth daily.   folic acid 1 MG tablet Commonly known as:  FOLVITE Take 1 mg by mouth daily.   gabapentin 300 MG capsule Commonly known as:  NEURONTIN Take 300 mg by mouth 3 (three) times daily.   lanthanum 500 MG chewable tablet Commonly known as:  FOSRENOL Chew 1,000 mg by mouth 3 (three) times daily with meals. And 1 with snacks   latanoprost 0.005 % ophthalmic solution Commonly known as:  XALATAN Place 1 drop into both eyes at bedtime.   levothyroxine 137 MCG tablet Commonly known as:  SYNTHROID, LEVOTHROID Take 137 mcg by mouth daily before breakfast.   megestrol 20 MG tablet Commonly known as:  MEGACE Take 1 tablet (20 mg total) by mouth 2 (two) times daily.   midodrine 10 MG tablet Commonly known as:  PROAMATINE Take 10 mg by mouth 2 (two) times daily.   ondansetron 8 MG tablet Commonly known as:  ZOFRAN Take 1 tablet (8 mg total) by mouth every 8 (eight) hours as needed for nausea or vomiting.   prochlorperazine 10 MG tablet Commonly known as:  COMPAZINE Take 1 tablet (10 mg total) by mouth every 6 (six) hours as needed for nausea or vomiting.   PROLENSA 0.07 % Soln Generic drug:  Bromfenac Sodium Apply 1 drop to eye daily.   SIMBRINZA 1-0.2 % Susp Generic drug:  Brinzolamide-Brimonidine INSTILL 1 DROP IN INTO BOTH EYES  TWICE A DAY   simvastatin 20 MG tablet Commonly known as:  ZOCOR Take 20 mg by mouth daily with supper.   VELCADE IJ Inject as directed. Days 1, 4, 8, 11 every 21 days.       Allergies  Allergen Reactions  . Other     Hydrocodone cough syrup makes him itch    Consultations:  nephrology   Procedures/Studies: Ct Abdomen Pelvis Wo Contrast  Result Date: 12/26/2016 CLINICAL DATA:  Invasion. Abdominal pain and vomiting for several weeks. Follow-up small bowel wall thickening. EXAM: CT ABDOMEN AND PELVIS WITHOUT CONTRAST TECHNIQUE: Multidetector CT imaging of the abdomen and pelvis was performed following the standard protocol without IV contrast. COMPARISON:  12/02/2016 CT abdomen/pelvis. FINDINGS: Lower chest: Mild scarring versus atelectasis in the medial lower lobe bases. Stable small calcified pleural plaque along the left hemidiaphragm. Cardiomegaly. Left pericardial curvilinear calcification is partially visualized. Hepatobiliary: Normal liver with no liver mass. Contracted gallbladder with cholelithiasis. No definite gallbladder wall thickening accounting for the contracted state. Trace pericholecystic fluid. No biliary ductal dilatation. Pancreas: Normal, with no mass or duct dilation. Spleen: Normal size spleen. 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 No new splenic lesions. Adrenals/Urinary Tract: Normal adrenals. No hydronephrosis. Native kidneys are symmetrically severely atrophic. Multiple stable simple or minimally complex renal cysts in both native kidneys measuring up to the 2.9 cm in the anterior interpolar right kidney and 1.3 cm in the posterior lower left kidney, not appreciably changed. 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 Collapsed and grossly normal bladder. Stomach/Bowel: Grossly normal stomach. Normal caliber small bowel with no small bowel wall thickening. Normal appendix. Oral contrast progresses to  the hepatic flexure of the colon. Minimal sigmoid diverticulosis, with no large bowel wall thickening or pericolonic fat stranding. Vascular/Lymphatic: Atherosclerotic nonaneurysmal abdominal aorta. No pathologically enlarged lymph nodes in the abdomen or pelvis. Reproductive: Normal size prostate . Other: No pneumoperitoneum or focal fluid collection. Trace ascites in the perihepatic region and pelvis. Musculoskeletal: No aggressive appearing focal osseous lesions. Diffuse osteopenia with mild rugger [place] spine changes characteristic of renal osteodystrophy. Mild thoracolumbar spondylosis. IMPRESSION: 1. No evidence of bowel obstruction or acute bowel inflammation. No significant bowel wall thickening. 2. Trace ascites. 3. Contracted gallbladder with cholelithiasis. No biliary ductal dilatation. 4. Additional findings include aortic atherosclerosis, cardiomegaly, left pericardial calcification, small calcified left pleural plaque, coarsely calcified left lower quadrant renal transplant, stable findings of renal osteodystrophy and minimal sigmoid diverticulosis. Electronically Signed   By: Ilona Sorrel M.D.   On: 12/26/2016 09:10   Dg Chest 2 View  Result Date: 12/05/2016 CLINICAL DATA:  Fever. EXAM: CHEST  2 VIEW COMPARISON:  Radiographs of February 13, 2015. FINDINGS: Stable cardiomediastinal silhouette and mild central pulmonary vascular congestion. Atherosclerosis of thoracic aorta is noted. Status post right shoulder arthroplasty. No pneumothorax or pleural effusion is noted. No acute pulmonary disease is noted. IMPRESSION: No active cardiopulmonary disease.  Aortic atherosclerosis. Electronically Signed   By: Marijo Conception, M.D.   On: 12/05/2016 16:52   US Abdomen Complete  Result Date: 12/02/2016 CLINICAL DATA:  66 year old male with history of right upper quadrant abdominal pain for 1 day. EXAM: ABDOMEN ULTRASOUND COMPLETE COMPARISON:  No priors.  CT of abdomen and pelvis 12/02/2016. FINDINGS:  Gallbladder: Small echogenic foci without significant posterior acoustic shadowing, compatible with the small gallstones noted on recent CT examination. Gallbladder is only moderately distended. Gallbladder wall thickness is normal (2.1 mm). No pericholecystic fluid. Per report from the sonographer, the patient did not exhibit a sonographic Murphy's sign on examination. Common bile duct: Diameter: 5.5 mm Liver: Heterogeneous echotexture throughout the hepatic parenchyma, without discrete hepatic lesions. No intra hepatic biliary ductal dilatation. Normal hepatopetal flow in the portal vein. IVC: No abnormality visualized. Pancreas: Visualized portion unremarkable. Spleen: Normal in size. 2.1 x 2.3 x 1.9 cm anechoic to hypoechoic lesion with a few internal septations, increase through transmission and no internal blood flow, indeterminate, but favored to represent a minimally complex cyst. Notably, a lesion of this size has been present on prior CT examinations dating back to 2013, presumably benign. Right Kidney: Length: 11.3 cm. Diffuse cortical thinning and diffusely increased cortical echogenicity, indicative of underlying medical renal disease. No hydronephrosis visualized. Multiple anechoic lesions with increased through transmission, compatible with simple cysts, measuring up to 2.8 x 2.8 x 2.9 cm in the upper pole. Innumerable other smaller lesions range from anechoic to hypoechoic, incompletely characterized, but favored to represent small cysts of varying degrees of complexity. Left Kidney: Length: 7.3 cm. Diffuse cortical thinning and diffusely increased cortical echogenicity, indicative of underlying medical renal disease. No hydronephrosis visualized. Multiple anechoic  lesions with increased through transmission, compatible with simple cysts, measuring up to 1.2 x 1.2 x 1.2 cm in the interpolar region. Innumerable other smaller lesions range from anechoic to hypoechoic, incompletely characterized, but  favored to represent small cysts of varying degrees of complexity. Abdominal aorta: No aneurysm visualized. Other findings: None. IMPRESSION: 1. Severe cortical thinning in the kidneys bilaterally, with diffuse increased echogenicity throughout the renal parenchyma, suggestive of underlying medical renal disease. Associated with this, there are innumerable cystic lesions in the kidneys bilaterally, likely related to underlying renal disease. 2. Cholelithiasis without evidence of acute cholecystitis at this time. 3. Indeterminate lesion in the spleen which appears stable compared to numerous prior CT examinations going back several years, presumably a benign mildly complex cyst. Electronically Signed   By: Vinnie Langton M.D.   On: 12/02/2016 14:32   Dg Abd Acute W/chest  Result Date: 12/24/2016 CLINICAL DATA:  Nausea vomiting diarrhea. Multiple myeloma. Dialysis patient. EXAM: DG ABDOMEN ACUTE W/ 1V CHEST COMPARISON:  CT abdomen pelvis 12/02/2016 FINDINGS: Cardiac enlargement with mild vascular congestion. Negative for pulmonary edema or effusion. Negative for pneumonia Negative bowel gas pattern. Negative for bowel obstruction or ileus. No free air. Large calcific mass the left abdomen is unchanged consistent with calcified renal transplant. IMPRESSION: Cardiac enlargement with vascular congestion.  Negative for edema Negative bowel gas pattern. Electronically Signed   By: Franchot Gallo M.D.   On: 12/24/2016 13:27   Ct Renal Stone Study  Result Date: 12/02/2016 CLINICAL DATA:  Abdominal pain and vomiting. EXAM: CT ABDOMEN AND PELVIS WITHOUT CONTRAST TECHNIQUE: Multidetector CT imaging of the abdomen and pelvis was performed following the standard protocol without IV contrast. COMPARISON:  10/01/2011 FINDINGS: Lower chest: No acute abnormality.  Cardiac enlargement noted. Hepatobiliary: No focal liver abnormality. Small stones are identified within the dependent portion of the gallbladder. Pancreas:  Unremarkable. No pancreatic ductal dilatation or surrounding inflammatory changes. Spleen: No change in 2.5 cm low-attenuation structure within the spleen, image number 9 of series 2. Adrenals/Urinary Tract: The adrenal glands are normal. End-stage bilateral kidneys identified containing multiple cysts. Bilateral renal calculi are identified on the left these measure up to 5 mm. On the right these measure up to 5 mm. No hydronephrosis or hydroureter identified. No ureteral lithiasis. Calcified mass within the left lower quadrant of the abdomen compatible with chronic infarcted renal graft. Urinary bladder is unremarkable for degree of distention. Stomach/Bowel: The stomach appears normal. Left upper quadrant small bowel loops exhibit mild wall thickening. No abnormal dilatation of the small or large bowel loops. No evidence for obstruction. Vascular/Lymphatic: Aortic atherosclerosis. No aneurysm. No upper abdominal adenopathy. Reproductive: Prostate is unremarkable. Other: No abdominal wall hernia or abnormality. No abdominopelvic ascites. Musculoskeletal: Changes of renal osteodystrophy identified. Multiple Schmorl node deformities are noted within the lumbar spine. IMPRESSION: 1. There is mild wall thickening involving left upper quadrant small bowel loops. Nonspecific in may reflect generalized anasarca secondary to renal failure. Cannot rule out enteritis. 2. Bilateral end-stage cystic kidneys. Renal calculi are noted bilaterally without evidence for hydronephrosis. 3. 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 4.  Aortic Atherosclerosis (ICD10-I70.0). 5. Gallstones .er Electronically Signed   By: Kerby Moors M.D.   On: 12/02/2016 13:05         Discharge Exam: Vitals:   12/26/16 0559 12/26/16 0918  BP: 95/61 (!) 80/49  Pulse: 73 71  Resp: 18   Temp: 98 F (36.7 C)    Vitals:   12/25/16 2002 12/25/16 2100  12/26/16 0559 12/26/16 0918  BP:  103/77 95/61 (!) 80/49  Pulse:  86 73 71   Resp:  18 18   Temp:  98 F (36.7 C) 98 F (36.7 C)   TempSrc:  Oral Oral   SpO2: 98% 100% 99%   Weight:      Height:        General: Pt is alert, awake, not in acute distress Cardiovascular: RRR, S1/S2 +, no rubs, no gallops Respiratory: CTA bilaterally, no wheezing, no rhonchi Abdominal: Soft, NT, ND, bowel sounds + Extremities: no edema, no cyanosis   The results of significant diagnostics from this hospitalization (including imaging, microbiology, ancillary and laboratory) are listed below for reference.    Significant Diagnostic Studies: Ct Abdomen Pelvis Wo Contrast  Result Date: 12/26/2016 CLINICAL DATA:  Invasion. Abdominal pain and vomiting for several weeks. Follow-up small bowel wall thickening. EXAM: CT ABDOMEN AND PELVIS WITHOUT CONTRAST TECHNIQUE: Multidetector CT imaging of the abdomen and pelvis was performed following the standard protocol without IV contrast. COMPARISON:  12/02/2016 CT abdomen/pelvis. FINDINGS: Lower chest: Mild scarring versus atelectasis in the medial lower lobe bases. Stable small calcified pleural plaque along the left hemidiaphragm. Cardiomegaly. Left pericardial curvilinear calcification is partially visualized. Hepatobiliary: Normal liver with no liver mass. Contracted gallbladder with cholelithiasis. No definite gallbladder wall thickening accounting for the contracted state. Trace pericholecystic fluid. No biliary ductal dilatation. Pancreas: Normal, with no mass or duct dilation. Spleen: Normal size spleen. Stable cystic 2.6 cm central splenic lesion, compatible with a lymphangioma. No new splenic lesions. Adrenals/Urinary Tract: Normal adrenals. No hydronephrosis. Native kidneys are symmetrically severely atrophic. Multiple stable simple or minimally complex renal cysts in both native kidneys measuring up to the 2.9 cm in the anterior interpolar right kidney and 1.3 cm in the posterior lower left kidney, not appreciably changed. Coarsely calcified  left lower quadrant transplant kidney, unchanged. Collapsed and grossly normal bladder. Stomach/Bowel: Grossly normal stomach. Normal caliber small bowel with no small bowel wall thickening. Normal appendix. Oral contrast progresses to the hepatic flexure of the colon. Minimal sigmoid diverticulosis, with no large bowel wall thickening or pericolonic fat stranding. Vascular/Lymphatic: Atherosclerotic nonaneurysmal abdominal aorta. No pathologically enlarged lymph nodes in the abdomen or pelvis. Reproductive: Normal size prostate . Other: No pneumoperitoneum or focal fluid collection. Trace ascites in the perihepatic region and pelvis. Musculoskeletal: No aggressive appearing focal osseous lesions. Diffuse osteopenia with mild rugger [place] spine changes characteristic of renal osteodystrophy. Mild thoracolumbar spondylosis. IMPRESSION: 1. No evidence of bowel obstruction or acute bowel inflammation. No significant bowel wall thickening. 2. Trace ascites. 3. Contracted gallbladder with cholelithiasis. No biliary ductal dilatation. 4. Additional findings include aortic atherosclerosis, cardiomegaly, left pericardial calcification, small calcified left pleural plaque, coarsely calcified left lower quadrant renal transplant, stable findings of renal osteodystrophy and minimal sigmoid diverticulosis. Electronically Signed   By: Ilona Sorrel M.D.   On: 12/26/2016 09:10   Dg Chest 2 View  Result Date: 12/05/2016 CLINICAL DATA:  Fever. EXAM: CHEST  2 VIEW COMPARISON:  Radiographs of February 13, 2015. FINDINGS: Stable cardiomediastinal silhouette and mild central pulmonary vascular congestion. Atherosclerosis of thoracic aorta is noted. Status post right shoulder arthroplasty. No pneumothorax or pleural effusion is noted. No acute pulmonary disease is noted. IMPRESSION: No active cardiopulmonary disease.  Aortic atherosclerosis. Electronically Signed   By: Marijo Conception, M.D.   On: 12/05/2016 16:52   US Abdomen  Complete  Result Date: 12/02/2016 CLINICAL DATA:  66 year old male with history of right upper  quadrant abdominal pain for 1 day. EXAM: ABDOMEN ULTRASOUND COMPLETE COMPARISON:  No priors.  CT of abdomen and pelvis 12/02/2016. FINDINGS: Gallbladder: Small echogenic foci without significant posterior acoustic shadowing, compatible with the small gallstones noted on recent CT examination. Gallbladder is only moderately distended. Gallbladder wall thickness is normal (2.1 mm). No pericholecystic fluid. Per report from the sonographer, the patient did not exhibit a sonographic Murphy's sign on examination. Common bile duct: Diameter: 5.5 mm Liver: Heterogeneous echotexture throughout the hepatic parenchyma, without discrete hepatic lesions. No intra hepatic biliary ductal dilatation. Normal hepatopetal flow in the portal vein. IVC: No abnormality visualized. Pancreas: Visualized portion unremarkable. Spleen: Normal in size. 2.1 x 2.3 x 1.9 cm anechoic to hypoechoic lesion with a few internal septations, increase through transmission and no internal blood flow, indeterminate, but favored to represent a minimally complex cyst. Notably, a lesion of this size has been present on prior CT examinations dating back to 2013, presumably benign. Right Kidney: Length: 11.3 cm. Diffuse cortical thinning and diffusely increased cortical echogenicity, indicative of underlying medical renal disease. No hydronephrosis visualized. Multiple anechoic lesions with increased through transmission, compatible with simple cysts, measuring up to 2.8 x 2.8 x 2.9 cm in the upper pole. Innumerable other smaller lesions range from anechoic to hypoechoic, incompletely characterized, but favored to represent small cysts of varying degrees of complexity. Left Kidney: Length: 7.3 cm. Diffuse cortical thinning and diffusely increased cortical echogenicity, indicative of underlying medical renal disease. No hydronephrosis visualized. Multiple anechoic  lesions with increased through transmission, compatible with simple cysts, measuring up to 1.2 x 1.2 x 1.2 cm in the interpolar region. Innumerable other smaller lesions range from anechoic to hypoechoic, incompletely characterized, but favored to represent small cysts of varying degrees of complexity. Abdominal aorta: No aneurysm visualized. Other findings: None. IMPRESSION: 1. Severe cortical thinning in the kidneys bilaterally, with diffuse increased echogenicity throughout the renal parenchyma, suggestive of underlying medical renal disease. Associated with this, there are innumerable cystic lesions in the kidneys bilaterally, likely related to underlying renal disease. 2. Cholelithiasis without evidence of acute cholecystitis at this time. 3. Indeterminate lesion in the spleen which appears stable compared to numerous prior CT examinations going back several years, presumably a benign mildly complex cyst. Electronically Signed   By: Vinnie Langton M.D.   On: 12/02/2016 14:32   Dg Abd Acute W/chest  Result Date: 12/24/2016 CLINICAL DATA:  Nausea vomiting diarrhea. Multiple myeloma. Dialysis patient. EXAM: DG ABDOMEN ACUTE W/ 1V CHEST COMPARISON:  CT abdomen pelvis 12/02/2016 FINDINGS: Cardiac enlargement with mild vascular congestion. Negative for pulmonary edema or effusion. Negative for pneumonia Negative bowel gas pattern. Negative for bowel obstruction or ileus. No free air. Large calcific mass the left abdomen is unchanged consistent with calcified renal transplant. IMPRESSION: Cardiac enlargement with vascular congestion.  Negative for edema Negative bowel gas pattern. Electronically Signed   By: Franchot Gallo M.D.   On: 12/24/2016 13:27   Ct Renal Stone Study  Result Date: 12/02/2016 CLINICAL DATA:  Abdominal pain and vomiting. EXAM: CT ABDOMEN AND PELVIS WITHOUT CONTRAST TECHNIQUE: Multidetector CT imaging of the abdomen and pelvis was performed following the standard protocol without IV  contrast. COMPARISON:  10/01/2011 FINDINGS: Lower chest: No acute abnormality.  Cardiac enlargement noted. Hepatobiliary: No focal liver abnormality. Small stones are identified within the dependent portion of the gallbladder. Pancreas: Unremarkable. No pancreatic ductal dilatation or surrounding inflammatory changes. Spleen: No change in 2.5 cm low-attenuation structure within the spleen, image number 9 of  series 2. Adrenals/Urinary Tract: The adrenal glands are normal. End-stage bilateral kidneys identified containing multiple cysts. Bilateral renal calculi are identified on the left these measure up to 5 mm. On the right these measure up to 5 mm. No hydronephrosis or hydroureter identified. No ureteral lithiasis. Calcified mass within the left lower quadrant of the abdomen compatible with chronic infarcted renal graft. Urinary bladder is unremarkable for degree of distention. Stomach/Bowel: The stomach appears normal. Left upper quadrant small bowel loops exhibit mild wall thickening. No abnormal dilatation of the small or large bowel loops. No evidence for obstruction. Vascular/Lymphatic: Aortic atherosclerosis. No aneurysm. No upper abdominal adenopathy. Reproductive: Prostate is unremarkable. Other: No abdominal wall hernia or abnormality. No abdominopelvic ascites. Musculoskeletal: Changes of renal osteodystrophy identified. Multiple Schmorl node deformities are noted within the lumbar spine. IMPRESSION: 1. There is mild wall thickening involving left upper quadrant small bowel loops. Nonspecific in may reflect generalized anasarca secondary to renal failure. Cannot rule out enteritis. 2. Bilateral end-stage cystic kidneys. Renal calculi are noted bilaterally without evidence for hydronephrosis. 3. 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 4.  Aortic Atherosclerosis (ICD10-I70.0). 5. Gallstones .er Electronically Signed   By: Kerby Moors M.D.   On: 12/02/2016 13:05     Microbiology: No  results found for this or any previous visit (from the past 240 hour(s)).   Labs: Basic Metabolic Panel:  Recent Labs Lab 12/24/16 1228 12/25/16 0508 12/25/16 1225  NA 139 142 139  K 3.0* 3.0* 3.0*  CL 95* 102 99*  CO2 '26 24 25  ' GLUCOSE 89 76 86  BUN 37* 39* 42*  CREATININE 12.15* 12.51* 12.70*  CALCIUM 7.4* 7.5* 7.7*  MG 2.0  --   --   PHOS  --   --  7.3*   Liver Function Tests:  Recent Labs Lab 12/24/16 1228 12/25/16 1225  AST 23  --   ALT 12*  --   ALKPHOS 60  --   BILITOT 0.6  --   PROT 6.9  --   ALBUMIN 3.2* 2.8*    Recent Labs Lab 12/24/16 1228  LIPASE 91*   No results for input(s): AMMONIA in the last 168 hours. CBC:  Recent Labs Lab 12/24/16 1228 12/25/16 0508 12/25/16 1222 12/26/16 0608  WBC 5.4 4.6 5.2 4.9  NEUTROABS 4.1 3.2  --   --   HGB 9.7* 9.1* 8.9* 8.7*  HCT 29.5* 27.4* 27.4* 26.5*  MCV 77.0* 77.4* 77.8* 77.9*  PLT 79* 66* 45* 46*   Cardiac Enzymes:  Recent Labs Lab 12/24/16 1228 12/24/16 1726 12/24/16 2254 12/25/16 0508  TROPONINI 0.05* 0.06* 0.06* 0.05*   BNP: Invalid input(s): POCBNP CBG: No results for input(s): GLUCAP in the last 168 hours.  Time coordinating discharge:  Greater than 30 minutes  Signed:  Careena Degraffenreid, DO [facility] Pager: [phone] 12/26/2016, 10:01 AM

## 2016-12-26 NOTE — Care Management Note (Signed)
Case Management Note  Patient Details  Name: Brian Liu MRN: [id number] Date of Birth: 1950/11/10  Expected Discharge Date:  12/26/16               Expected Discharge Plan:  [place]  In-House Referral:  NA  Discharge planning Services  CM Consult  Post Acute Care Choice:  Durable Medical Equipment Choice offered to:  Patient  DME Arranged:  [facility] DME Agency:  [place] Arranged:  PT [facility] Agency:  [place]  Status of Service:  Completed, signed off  Additional Comments: Pt discharging home today. He will need RW (WC has been ordered but pt things a RW would be more helpful at this time) He will also need HH PT. He has chosen [facility] from list of [facility] and DME providers. Pt aware HH has 48hrs to make first appointment. Romualdo Bolk, of [facility], aware of referral and will obtain pt info from chart. RW will be delivered to pt room prior to DC. Pt's brother at bedside to transport home. No further needs communicated.   Sherald Barge, RN 12/26/2016, 12:28 PM

## 2016-12-26 NOTE — Evaluation (Signed)
Physical Therapy Evaluation Patient Details Name: Brian Liu MRN: [id number] DOB: February 22, 1951 Today's Date: 12/26/2016   History of Present Illness  66 y.o. male with medical history significant of multiple myeloma currently undergoing chemotherapy,  HTN, mitral regurgitation and ESRD (on dialysis M, W, F) that presents to the ED after being directed there from the [place].  Per patient's son who is bedside, patient received chemotherapy at a slightly higher dose 4 days prior to admission.  He woke up the next morning after chemo and felt very nauseous and began vomiting.  Patient denies hematemesis.  Patient also felt very weak.  Per patient's son these symptoms persisted over the past three days.  Patient was so weak yesterday he could not go to dialysis.  Patient was trying to make it to his chemotherapy appointment today and did not want to make much of his symptoms.  His son called the cancer center today and was directed to come to the ED for further evaluation and treatment and his chemotherapy was rescheduled.  Patient also voices he has abdominal pain that is generalized and worse with coughing or hiccuping.  He denies anything makes the pain better.  He denies constipation and diarrhea.  Denies shortness of breath, chest pain, chest pressure, fevers or chills.  No sick contacts.      Clinical Impression  Pt received in bed, and was agreeable to PT evaluation.  Pt states that he lives with his son, and uses a cane for ambulation sometimes.  He is independent with dressing and bathing, but has not driven since the first of the year due to issues with his eyes.  His son and sister assist with running errands and taking him to appointments.  PT evaluation was very limited today due to hypotension.  He demonstrated ability to sit up on the EOB on his own, however upon doing so he became very dizzy, and needed to return to supine stating he felt as if he was going to pass out.  BP was obtained  at that point, and found to be 80/49 with HR: 71bpm.  RN was notified of this, and further mobility was deferred due to this symptomatic hypotension.  Recommend that pt return home with HHPT, and RW due to reduced strength, and endurance.       Follow Up Recommendations Home health PT    Equipment Recommendations  Rolling walker with 5" wheels    Recommendations for Other Services       Precautions / Restrictions Precautions Precautions: Fall Precaution Comments: no falls in the last 6 months.  But he is a fall risk due to orthostasis.  Restrictions Weight Bearing Restrictions: No      Mobility  Bed Mobility Overal bed mobility: Modified Independent Bed Mobility: Supine to Sit;Sit to Supine     Supine to sit: Modified independent (Device/Increase time) Sit to supine: Modified independent (Device/Increase time)   General bed mobility comments: Pt was able to sit up on the EOB, however, he expressed that he could not do any more than that because he was becomming extremely dizzy, and felt as if he was going to pass out.  Pt returned to supine, and BP obtained and was found to be 80/49, and HR 71bpm.  Further tx deferred due to symptomic hypotension.   Transfers                    Ambulation/Gait  Stairs            Wheelchair Mobility    Modified Rankin (Stroke Patients Only)       Balance Overall balance assessment: Modified Independent Sitting-balance support: Feet supported;Bilateral upper extremity supported Sitting balance-Leahy Scale: Good                                       Pertinent Vitals/Pain Pain Assessment: No/denies pain    Home Living   Living Arrangements: Children (son) Available Help at Discharge: Available 24 hours/day Type of Home: House Home Access: Stairs to enter   [employer] of Steps: 1 Home Layout: One level Home Equipment: Cane - single point      Prior Function      Gait / Transfers Assistance Needed: Pt uses a cane for ambulation some of the time.  Has not been a community ambulator since Jan 2018 due to his eyes  ADL's / Homemaking Assistance Needed: independent with dressing, and bathing.   Comments: Son or sisters will assist with driving pt to doctor's appointments and running errands.      Hand Dominance   Dominant Hand: Left    Extremity/Trunk Assessment   Upper Extremity Assessment Upper Extremity Assessment: Generalized weakness    Lower Extremity Assessment Lower Extremity Assessment: Generalized weakness       Communication   Communication: No difficulties  Cognition Arousal/Alertness: Awake/alert Behavior During Therapy: WFL for tasks assessed/performed Overall Cognitive Status: Within Functional Limits for tasks assessed                                        General Comments      Exercises     Assessment/Plan    PT Assessment Patient needs continued PT services  PT Problem List Decreased strength;Decreased activity tolerance;Decreased balance;Decreased mobility       PT Treatment Interventions DME instruction;Gait training;Functional mobility training;Therapeutic activities;Therapeutic exercise;Balance training    PT Goals (Current goals can be found in the Care Plan section)  Acute Rehab PT Goals Patient Stated Goal: Pt wants to get stronger PT Goal Formulation: With patient Time For Goal Achievement: 01/02/17 Potential to Achieve Goals: Fair    Frequency Min 2X/week   Barriers to discharge        Co-evaluation               AM-PAC PT "6 Clicks" Daily Activity  Outcome Measure Difficulty turning over in bed (including adjusting bedclothes, sheets and blankets)?: None Difficulty moving from lying on back to sitting on the side of the bed? : None Difficulty sitting down on and standing up from a chair with arms (e.g., wheelchair, bedside commode, etc,.)?: A Little Help needed  moving to and from a bed to chair (including a wheelchair)?: A Little Help needed walking in hospital room?: A Little Help needed climbing 3-5 steps with a railing? : A Little 6 Click Score: 20    End of Session   Activity Tolerance: Treatment limited secondary to medical complications (Comment) (orthostatic hypotension) Patient left: in bed;with call bell/phone within reach Nurse Communication: Mobility status Horris Latino, RN notified of BP) PT Visit Diagnosis: Muscle weakness (generalized) (M62.81);Other abnormalities of gait and mobility (R26.89);Dizziness and giddiness (R42);Unsteadiness on feet (R26.81)    Time: 6808-8110 PT Time Calculation (min) (ACUTE ONLY): 19 min  Charges:   PT Evaluation $PT Eval Low Complexity: 1 Procedure     PT G Codes:   PT G-Codes **NOT FOR INPATIENT CLASS** Functional Assessment Tool Used: AM-PAC 6 Clicks Basic Mobility;Clinical judgement Functional Limitation: Mobility: Walking and moving around Mobility: Walking and Moving Around Current Status (I7195): At least 20 percent but less than 40 percent impaired, limited or restricted Mobility: Walking and Moving Around Goal Status [phone]): At least 1 percent but less than 20 percent impaired, limited or restricted    Beth Zenobia Kuennen, PT, DPT X: [phone]

## 2016-12-27 ENCOUNTER — Other Ambulatory Visit (HOSPITAL_COMMUNITY): Payer: Medicare Other

## 2016-12-27 ENCOUNTER — Other Ambulatory Visit: Payer: Self-pay

## 2016-12-27 ENCOUNTER — Emergency Department (HOSPITAL_COMMUNITY)
Admission: EM | Admit: 2016-12-27 | Discharge: 2016-12-27 | Discharge status: Against Medical Advice | Payer: Medicare Other | Attending: Emergency Medicine | Admitting: Emergency Medicine

## 2016-12-27 ENCOUNTER — Emergency Department (HOSPITAL_COMMUNITY): Payer: Medicare Other

## 2016-12-27 ENCOUNTER — Encounter (HOSPITAL_BASED_OUTPATIENT_CLINIC_OR_DEPARTMENT_OTHER): Payer: Medicare Other

## 2016-12-27 ENCOUNTER — Encounter (HOSPITAL_COMMUNITY): Payer: Self-pay

## 2016-12-27 ENCOUNTER — Encounter (HOSPITAL_COMMUNITY): Payer: Self-pay | Admitting: Emergency Medicine

## 2016-12-27 VITALS — BP 63/44 | HR 70 | Temp 97.8°F | Resp 18

## 2016-12-27 DIAGNOSIS — D472 Monoclonal gammopathy: Secondary | ICD-10-CM | POA: Diagnosis not present

## 2016-12-27 DIAGNOSIS — I959 Hypotension, unspecified: Secondary | ICD-10-CM | POA: Insufficient documentation

## 2016-12-27 DIAGNOSIS — E876 Hypokalemia: Secondary | ICD-10-CM | POA: Diagnosis not present

## 2016-12-27 DIAGNOSIS — Z5112 Encounter for antineoplastic immunotherapy: Secondary | ICD-10-CM

## 2016-12-27 DIAGNOSIS — Z79899 Other long term (current) drug therapy: Secondary | ICD-10-CM | POA: Insufficient documentation

## 2016-12-27 DIAGNOSIS — D631 Anemia in chronic kidney disease: Secondary | ICD-10-CM | POA: Diagnosis not present

## 2016-12-27 DIAGNOSIS — N186 End stage renal disease: Secondary | ICD-10-CM | POA: Insufficient documentation

## 2016-12-27 DIAGNOSIS — N2581 Secondary hyperparathyroidism of renal origin: Secondary | ICD-10-CM | POA: Diagnosis not present

## 2016-12-27 DIAGNOSIS — I120 Hypertensive chronic kidney disease with stage 5 chronic kidney disease or end stage renal disease: Secondary | ICD-10-CM | POA: Diagnosis not present

## 2016-12-27 DIAGNOSIS — Z992 Dependence on renal dialysis: Secondary | ICD-10-CM | POA: Insufficient documentation

## 2016-12-27 DIAGNOSIS — C9000 Multiple myeloma not having achieved remission: Secondary | ICD-10-CM | POA: Diagnosis not present

## 2016-12-27 DIAGNOSIS — R0602 Shortness of breath: Secondary | ICD-10-CM | POA: Diagnosis not present

## 2016-12-27 LAB — CBC WITH DIFFERENTIAL/PLATELET
BASOS ABS: 0 10*3/uL (ref 0.0–0.1)
BASOS PCT: 0 %
Eosinophils Absolute: 0.1 10*3/uL (ref 0.0–0.7)
Eosinophils Relative: 1 %
HEMATOCRIT: 26.7 % — AB (ref 39.0–52.0)
HEMOGLOBIN: 8.5 g/dL — AB (ref 13.0–17.0)
LYMPHS PCT: 14 %
Lymphs Abs: 0.9 10*3/uL (ref 0.7–4.0)
MCH: 25 pg — ABNORMAL LOW (ref 26.0–34.0)
MCHC: 31.8 g/dL (ref 30.0–36.0)
MCV: 78.5 fL (ref 78.0–100.0)
Monocytes Absolute: 0.9 10*3/uL (ref 0.1–1.0)
Monocytes Relative: 13 %
NEUTROS ABS: 4.5 10*3/uL (ref 1.7–7.7)
NEUTROS PCT: 71 %
Platelets: 56 10*3/uL — ABNORMAL LOW (ref 150–400)
RBC: 3.4 MIL/uL — AB (ref 4.22–5.81)
RDW: 15 % (ref 11.5–15.5)
WBC: 6.4 10*3/uL (ref 4.0–10.5)

## 2016-12-27 LAB — I-STAT CG4 LACTIC ACID, ED
LACTIC ACID, VENOUS: 2.39 mmol/L — AB (ref 0.5–1.9)
LACTIC ACID, VENOUS: 3.11 mmol/L — AB (ref 0.5–1.9)
LACTIC ACID, VENOUS: 1.13 mmol/L (ref 0.5–1.9)

## 2016-12-27 LAB — BASIC METABOLIC PANEL
ANION GAP: 10 (ref 5–15)
BUN: 14 mg/dL (ref 6–20)
CALCIUM: 8.2 mg/dL — AB (ref 8.9–10.3)
CO2: 28 mmol/L (ref 22–32)
Chloride: 98 mmol/L — ABNORMAL LOW (ref 101–111)
Creatinine, Ser: 4.6 mg/dL — ABNORMAL HIGH (ref 0.61–1.24)
GFR calc non Af Amer: 12 mL/min — ABNORMAL LOW (ref >60)
GFR, EST AFRICAN AMERICAN: 14 mL/min — AB (ref >60)
GLUCOSE: 96 mg/dL (ref 65–99)
POTASSIUM: 2.9 mmol/L — AB (ref 3.5–5.1)
Sodium: 136 mmol/L (ref 135–145)

## 2016-12-27 LAB — BRAIN NATRIURETIC PEPTIDE: B Natriuretic Peptide: 423 pg/mL — ABNORMAL HIGH (ref 0.0–100.0)

## 2016-12-27 LAB — TROPONIN I: Troponin I: 0.05 ng/mL (ref <0.03)

## 2016-12-27 MED ORDER — ONDANSETRON HCL 4 MG PO TABS: 8.0000 mg | ORAL_TABLET | Freq: Once | ORAL | Status: DC

## 2016-12-27 MED ORDER — MIDODRINE HCL 5 MG PO TABS
10.0000 mg | ORAL_TABLET | Freq: Once | ORAL | Status: AC
  Administered 2016-12-27: 10 mg via ORAL
  Filled 2016-12-27: qty 2

## 2016-12-27 MED ORDER — BORTEZOMIB CHEMO SQ INJECTION 3.5 MG (2.5MG/ML)
1.3000 mg/m2 | Freq: Once | INTRAMUSCULAR | Status: AC
  Administered 2016-12-27: 2.5 mg via SUBCUTANEOUS
  Filled 2016-12-27: qty 2.5

## 2016-12-27 MED ORDER — SODIUM CHLORIDE 0.9 % IV BOLUS (SEPSIS)
500.0000 mL | Freq: Once | INTRAVENOUS | Status: AC
Start: 2016-12-27 — End: 2016-12-27
  Administered 2016-12-27: 500 mL via INTRAVENOUS

## 2016-12-27 MED ORDER — SODIUM CHLORIDE 0.9 % IV BOLUS (SEPSIS): 500.0000 mL | Freq: Once | INTRAVENOUS | Status: DC

## 2016-12-27 MED ORDER — SODIUM CHLORIDE 0.9 % IV BOLUS (SEPSIS)
500.0000 mL | Freq: Once | INTRAVENOUS | Status: AC
  Administered 2016-12-27: 500 mL via INTRAVENOUS

## 2016-12-27 NOTE — ED Notes (Signed)
CRITICAL VALUE ALERT  Critical Value:  Troponin 0.05  Date & Time Notied:  12/27/2016 at 1630  Provider Notified: Dr. Thurnell Garbe   Orders Received/Actions taken: none

## 2016-12-27 NOTE — ED Notes (Signed)
Date and time results received:12/27/16   Test: I-stat Critical Value: Lactic Acid  Name of Provider Notified: Dr. Thurnell Garbe  Orders Received? Or Actions Taken?: EDP notified and give verbal order to administer 500 cc NS bolus IV at 936ml/hr. Primary RN aware of result and order as well.

## 2016-12-27 NOTE — Discharge Instructions (Signed)
Make sure you take your usual prescriptions as previously directed.  Call your regular medical doctor on Tuesday to schedule a follow up appointment within the next 3 days.  Return to the Emergency Department immediately sooner if worsening.

## 2016-12-27 NOTE — Patient Instructions (Addendum)
[facility] Discharge Instructions for Patients Receiving Chemotherapy   Beginning January 23rd 2017 lab work for the [facility] will be done in the  Main lab at [facility] on 1st floor. If you have a lab appointment with the [facility] please come in thru the  Main Entrance and check in at the main information desk   Today you received the following chemotherapy agents Velcade injection  To help prevent nausea and vomiting after your treatment, we encourage you to take your nausea medication     If you develop nausea and vomiting, or diarrhea that is not controlled by your medication, call the clinic.  The clinic phone number is [phone]. Office hours are Monday-Friday 8:30am-5:00pm.  BELOW ARE SYMPTOMS THAT SHOULD BE REPORTED IMMEDIATELY:  *FEVER GREATER THAN 101.0 F  *CHILLS WITH OR WITHOUT FEVER  NAUSEA AND VOMITING THAT IS NOT CONTROLLED WITH YOUR NAUSEA MEDICATION  *UNUSUAL SHORTNESS OF BREATH  *UNUSUAL BRUISING OR BLEEDING  TENDERNESS IN MOUTH AND THROAT WITH OR WITHOUT PRESENCE OF ULCERS  *URINARY PROBLEMS  *BOWEL PROBLEMS  UNUSUAL RASH Items with * indicate a potential emergency and should be followed up as soon as possible. If you have an emergency after office hours please contact your primary care physician or go to the nearest emergency department.  Please call the clinic during office hours if you have any questions or concerns.   You may also contact the Patient Navigator at [phone] should you have any questions or need assistance in obtaining follow up care.      Resources For Cancer Patients and their Caregivers ? American Cancer Society: Can assist with transportation, wigs, general needs, runs Look Good Feel Better.        657-729-4532 ? Cancer Care: Provides financial assistance, online support groups, medication/co-pay assistance.  1-800-813-HOPE (820)101-0607) ? [facility] Assists  [place] cancer patients and their families through emotional , educational and financial support.  657-107-2679 ? [place] DSS Where to apply for food stamps, Medicaid and utility assistance. 754-527-6310 ? [facility]: Transportation to medical appointments. 229 145 0406 ? Social Security Administration: May apply for disability if have a Stage IV cancer. 726 475 3471 934-604-9466 ? [employer], Disability and Transit Services: Assists with nutrition, care and transit needs. 518-525-5430

## 2016-12-27 NOTE — ED Provider Notes (Signed)
[facility] DEPT Provider Note   CSN: [id number] Arrival date & time: 12/27/16  1424     History   Chief Complaint Chief Complaint  Patient presents with  . Shortness of Breath    HPI Brian Liu is a 66 y.o. male.   Shortness of Breath     Pt was seen at 1500. Per pt and his family, c/o gradual onset and persistence of constant generalized weakness that began after his HD this morning. Has been associated with "feeling like I can't catch my breath."  Pt unable to walk due to his generalized weakness. Pt went to his Onc MD office, had chemo infusion, then was sent to the ED for further evaluation. Denies CP/palpitations, no cough, no fevers, no abd pain, no N/V/D, no back pain.    Past Medical History:  Diagnosis Date  . Atrial fibrillation (HCC)    Permanent, Not on Coumadin because of history of lower GI bleed  . Diastolic dysfunction    Restrictive  diastolic filling pattern echo,  February, 2013  . Dyslipidemia   . Ejection fraction    EF 60-65%, echo, 2013  . ESRD (end stage renal disease) ([place])    Dialysis, fistula in left arm  . Hypertension   . Lower gastrointestinal bleed   . LVH (left ventricular hypertrophy)   . 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 Mitral regurgitation    Mild by echo, 2013  . Multiple myeloma ([place]) 04/30/2016  . Pancytopenia ([place])    Dr Ignacia Bayley  . Pulmonary hypertension (HCC)    Severe, PA pressure 65-70 mm of mercury, echo, 2013  . Tobacco abuse     Patient Active Problem List   Diagnosis Date Noted  . Elevated troponin   . Dehydration 12/24/2016  . ESRD on hemodialysis ([place]) 12/24/2016  . Nausea vomiting and diarrhea 12/24/2016  . Enteritis 12/04/2016  . Thrush 12/04/2016  . Pancreatitis 12/02/2016  . Hypokalemia 12/02/2016  . Anemia 12/02/2016  . Abdominal pain   . Multiple myeloma ([place]) 04/30/2016  . 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 Complication of dialysis access insertion  05/17/2014  . ESRD (end stage renal disease) ([place])   . Atrial fibrillation ([place])   . Hypertension   . Pulmonary hypertension ([place])   . Mitral regurgitation   . Tobacco abuse   . Dyslipidemia   . Lower gastrointestinal bleed   . Pancytopenia ([place])   . Ejection fraction   . Diastolic dysfunction   . LVH (left ventricular hypertrophy)     Past Surgical History:  Procedure Laterality Date  . AV FISTULA PLACEMENT Left 1992   Left arm   . 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 Authorizing Provider  acyclovir (ZOVIRAX) 400 MG tablet Take 1 tablet (400 mg total) by mouth 2 (two) times daily. 10/25/16  Yes Kefalas, Manon Hilding, PA-C  Bortezomib (VELCADE IJ) Inject as directed. Days 1, 4, 8, 11 every 21 days.   Yes [provider]  Bromfenac Sodium (PROLENSA) 0.07 % SOLN Apply 1 drop to eye daily.   Yes [provider]  cinacalcet (SENSIPAR) 30 MG tablet Take 30 mg by mouth.   Yes [provider]  cyclophosphamide (CYTOXAN) 50 MG tablet Take 300 mg by mouth daily. Give on an empty stomach 1 hour before or  2 hours after meals.   Yes [provider]  cycloSPORINE (RESTASIS) 0.05 % ophthalmic emulsion Place 1 drop into both eyes 2 (two) times daily.   Yes [provider]  dexamethasone (DECADRON) 4 MG tablet Take 10 tablets (40 mg) on days 1, 8, and 15 of chemo. Repeat every 21 days. Take 2 tablets (66m) daily for 2 days starting after days 1 and 8 of chemotherapy. 10/25/16  Yes Kefalas, TManon Hilding PA-C  Difluprednate (DUREZOL) 0.05 % EMUL Place 1 drop into the right eye 6 (six) times daily.   Yes [provider]  folic acid (FOLVITE) 1 MG tablet Take 1 mg by mouth daily.   Yes [provider]  gabapentin (NEURONTIN) 300 MG capsule Take 300 mg by mouth 3 (three) times daily.   Yes [provider]  lanthanum (FOSRENOL) 500 MG chewable tablet Chew 1,000 mg by mouth 3 (three) times daily with meals. And 1 with snacks   Yes [provider]  latanoprost (XALATAN) 0.005 % ophthalmic solution Place 1 drop into both eyes at bedtime. 07/13/14  Yes [provider]  levothyroxine (SYNTHROID, LEVOTHROID) 137 MCG tablet Take 137 mcg by mouth daily before breakfast.   Yes [provider]  midodrine (PROAMATINE) 10 MG tablet Take 10 mg by mouth 2 (two) times daily.  10/26/16  Yes [provider]  Multiple Vitamin (DAILY VITE) TABS Take 1 tablet by mouth daily.    Yes [provider]  ondansetron (ZOFRAN) 8 MG tablet Take 1 tablet (8 mg total) by mouth every 8 (eight) hours as needed for nausea or vomiting. 10/29/16  Yes Kefalas, TManon Hilding PA-C  prochlorperazine (COMPAZINE) 10 MG tablet Take 1 tablet (10 mg total) by mouth every 6 (six) hours as needed for nausea or vomiting. 10/29/16  Yes Kefalas, Thomas S, PA-C  SIMBRINZA 1-0.2 % SUSP INSTILL 1 DROP IN INTO BOTH EYES TWICE A DAY 01/03/15  Yes [provider]  simvastatin (ZOCOR) 20 MG tablet Take 20 mg by mouth daily with supper.     Yes [provider]  megestrol (MEGACE) 20 MG tablet Take 1 tablet (20 mg total) by mouth 2 (two) times daily. 12/17/16   ZTwana First MD    Family History Family History  Problem Relation Age of Onset  . Other Mother        Deceased, 845 . Stomach cancer Father        Deceased, 654 . Kidney failure Son   . Healthy Son     Social History Social History  Substance Use Topics  . Smoking status: Never Smoker  . Smokeless tobacco: Never Used  . Alcohol use No     Allergies   Other   Review of Systems Review of Systems  Respiratory: Positive for shortness of breath.   ROS: Statement: All systems negative except as marked or noted in the HPI; Constitutional: Negative for fever and chills. ; ; Eyes: Negative for eye pain, redness and  discharge. ; ; ENMT: Negative for ear pain, hoarseness, nasal congestion, sinus pressure and sore throat. ; ; Cardiovascular: Negative for chest pain, palpitations, diaphoresis, and peripheral edema. ; ; Respiratory: +"can't catch my breath." Negative for cough, wheezing and stridor. ; ; Gastrointestinal: Negative for nausea, vomiting, diarrhea, abdominal pain, blood in stool, hematemesis, jaundice and rectal bleeding. . ; ; Genitourinary: Negative for dysuria, flank pain and hematuria. ; ; Musculoskeletal: Negative for back pain and neck pain. Negative for swelling and  trauma.; ; Skin: Negative for pruritus, rash, abrasions, blisters, bruising and skin lesion.; ; Neuro: +generalized weakness. Negative for headache, lightheadedness and neck stiffness. Negative for altered level of consciousness, altered mental status, extremity weakness, paresthesias, involuntary movement, seizure and syncope.      Physical Exam Updated Vital Signs BP (!) 70/53   Pulse 77   Temp 97.5 F (36.4 C) (Oral)   Resp 12   Ht '6\' 1"'  (1.854 m)   Wt 67.6 kg (149 lb)   SpO2 98%   BMI 19.66 kg/m    Patient Vitals for the past 24 hrs:  BP Temp Temp src Pulse Resp SpO2 Height Weight  12/27/16 1830 (!) 83/56 - - - (!) 24 - - -  12/27/16 1800 (!) 78/63 - - - 15 - - -  12/27/16 1730 (!) 70/53 - - - 17 - - -  12/27/16 1700 (!) 82/63 - - - 16 - - -  12/27/16 1645 - - - - 12 - - -  12/27/16 1630 (!) 61/46 - - - 14 - - -  12/27/16 1620 (!) 70/53 - - 77 12 98 % - -  12/27/16 1619 (!) 67/47 - - 70 20 100 % - -  12/27/16 1545 - - - 74 18 99 % - -  12/27/16 1543 (!) 68/53 - - - 16 - - -  12/27/16 1530 (!) 76/58 - - - 17 - - -  12/27/16 1435 (!) 63/41 97.5 F (36.4 C) Oral 72 (!) 32 98 % '6\' 1"'  (1.854 m) 67.6 kg (149 lb)    Physical Exam 1505: Physical examination:  Nursing notes reviewed; Vital signs and O2 SAT reviewed;  Constitutional: Thin, frail, In no acute distress; Head:  Normocephalic, atraumatic; Eyes: EOMI, PERRL,  No scleral icterus; ENMT: Mouth and pharynx normal, Mucous membranes dry; Neck: Supple, Full range of motion, No lymphadenopathy; Cardiovascular: Regular rate and rhythm, No gallop; Respiratory: Breath sounds clear & equal bilaterally, No wheezes.  Speaking full sentences with ease, Normal respiratory effort/excursion; Chest: Nontender, Movement normal; Abdomen: Soft, Nontender, Nondistended, Normal bowel sounds; Genitourinary: No CVA tenderness; Extremities: Pulses normal, No tenderness, No edema, No calf edema or asymmetry.; Neuro: AA&Ox3, Major CN grossly intact.  Speech clear. No gross focal motor deficits in extremities.; Skin: Color normal, Warm, Dry.   ED Treatments / Results  Labs (all labs ordered are listed, but only abnormal results are displayed)   EKG  EKG Interpretation None       Radiology   Procedures Procedures (including critical care time)  Medications Ordered in ED Medications  sodium chloride 0.9 % bolus 500 mL (not administered)  sodium chloride 0.9 % bolus 500 mL (not administered)  midodrine (PROAMATINE) tablet 10 mg (not administered)  sodium chloride 0.9 % bolus 500 mL (500 mLs Intravenous New Bag/Given 12/27/16 1615)     Initial Impression / Assessment and Plan / ED Course  I have reviewed the triage vital signs and the nursing notes.  Pertinent labs & imaging results that were available during my care of the patient were reviewed by me and considered in my medical decision making (see chart for details).  MDM Reviewed: previous chart, nursing note and vitals Reviewed previous: labs and ECG Interpretation: labs, ECG and x-ray Total time providing critical care: 30-74 minutes. This excludes time spent performing separately reportable procedures and services. Consults: admitting MD    CRITICAL CARE Performed by: Alfonzo Feller Total critical care time: 35 minutes Critical care time was exclusive  of separately billable procedures and treating  other patients. Critical care was necessary to treat or prevent imminent or life-threatening deterioration. Critical care was time spent personally by me on the following activities: development of treatment plan with patient and/or surrogate as well as nursing, discussions with consultants, evaluation of patient's response to treatment, examination of patient, obtaining history from patient or surrogate, ordering and performing treatments and interventions, ordering and review of laboratory studies, ordering and review of radiographic studies, pulse oximetry and re-evaluation of patient's condition.   ED ECG REPORT   Date: 12/27/2016  Rate: 70  Rhythm: atrial fibrillation  QRS Axis: normal  Intervals: normal  ST/T Wave abnormalities: nonspecific T wave changes  Conduction Disutrbances:none  Narrative Interpretation:   Old EKG Reviewed: changes noted; QT interval has shortened compared to previous EKG dated 12/24/2016.  Results for orders placed or performed during the hospital encounter of 12/27/16  Troponin I  Result Value Ref Range   Troponin I 0.05 (HH) <0.03 ng/mL  CBC with Differential  Result Value Ref Range   WBC 6.4 4.0 - 10.5 K/uL   RBC 3.40 (L) 4.22 - 5.81 MIL/uL   Hemoglobin 8.5 (L) 13.0 - 17.0 g/dL   HCT 26.7 (L) 39.0 - 52.0 %   MCV 78.5 78.0 - 100.0 fL   MCH 25.0 (L) 26.0 - 34.0 pg   MCHC 31.8 30.0 - 36.0 g/dL   RDW 15.0 11.5 - 15.5 %   Platelets 56 (L) 150 - 400 K/uL   Neutrophils Relative % 71 %   Neutro Abs 4.5 1.7 - 7.7 K/uL   Lymphocytes Relative 14 %   Lymphs Abs 0.9 0.7 - 4.0 K/uL   Monocytes Relative 13 %   Monocytes Absolute 0.9 0.1 - 1.0 K/uL   Eosinophils Relative 1 %   Eosinophils Absolute 0.1 0.0 - 0.7 K/uL   Basophils Relative 0 %   Basophils Absolute 0.0 0.0 - 0.1 K/uL  Basic metabolic panel  Result Value Ref Range   Sodium 136 135 - 145 mmol/L   Potassium 2.9 (L) 3.5 - 5.1 mmol/L   Chloride 98 (L) 101 - 111 mmol/L   CO2 28 22 - 32 mmol/L    Glucose, Bld 96 65 - 99 mg/dL   BUN 14 6 - 20 mg/dL   Creatinine, Ser 4.60 (H) 0.61 - 1.24 mg/dL   Calcium 8.2 (L) 8.9 - 10.3 mg/dL   GFR calc non Af Amer 12 (L) >60 mL/min   GFR calc Af Amer 14 (L) >60 mL/min   Anion gap 10 5 - 15  Brain natriuretic peptide  Result Value Ref Range   B Natriuretic Peptide 423.0 (H) 0.0 - 100.0 pg/mL  I-Stat CG4 Lactic Acid, ED  Result Value Ref Range   Lactic Acid, Venous 3.11 (HH) 0.5 - 1.9 mmol/L   Comment MD NOTIFIED, REPEAT TEST   I-Stat CG4 Lactic Acid, ED  Result Value Ref Range   Lactic Acid, Venous 2.39 (HH) 0.5 - 1.9 mmol/L   Comment NOTIFIED PHYSICIAN   I-Stat CG4 Lactic Acid, ED  Result Value Ref Range   Lactic Acid, Venous 1.13 0.5 - 1.9 mmol/L    Dg Chest 2 View Result Date: 12/27/2016 CLINICAL DATA:  Short of breath after dialysis EXAM: CHEST  2 VIEW COMPARISON:  12/24/2016 FINDINGS: Moderate cardiomegaly with minimal central congestion but no overt edema or focal consolidation. No large effusion. Aortic atherosclerosis. No pneumothorax. IMPRESSION: 1. Moderate cardiomegaly with minimal central congestion. No overt edema or  focal infiltrate. 2. Possible tiny effusions. Electronically Signed   By: Donavan Foil M.D.   On: 12/27/2016 15:30    1700:  Troponin chronically elevated. Judicious IVF given with partial improvement of BP and trending downward of lactic acid.  Sats remain stable, pt appears NAD, resps easy and denies SOB while in the ED.  H/H per  baseline. T/C to [facility] Dr. Nehemiah Settle, case discussed, including:  HPI, pertinent PM/SHx, VS/PE, dx testing, ED course and treatment:  Request to give pt dose of his midodrine and c/b in 1 hour.   1845:  Pt's SBP continues 70-80's (baseline 100-110's). T/C to [facility] Dr. Nehemiah Settle, case discussed, including:  HPI, pertinent PM/SHx, VS/PE, dx testing, ED course and treatment:  Agreeable to admit.   1945:  Pt now refuses to stay for admission and wants to go home. Pt and family informed re: dx  testing results, including continued hypotension, and that I recommend observation admission for further evaluation.  Pt refuses admission.  I encouraged pt to stay, continues to refuse.  Pt makes his own medical decisions.  Risks of AMA explained to pt and family, including, but not limited to:  stroke, heart attack, cardiac arrythmia ("irregular heart rate/beat"), "passing out," temporary and/or permanent disability, death.  Pt and family verb understanding and continue to refuse admission, understanding the consequences of their decision.  I encouraged pt to follow up with his PMD on Tuesday and return to the ED immediately if symptoms worsen, return, or for any other concerns.  Pt and family verb understanding, agreeable.      Final Clinical Impressions(s) / ED Diagnoses   Final diagnoses:  None    New Prescriptions New Prescriptions   No medications on file      Francine Graven, DO 12/28/16 1639

## 2016-12-27 NOTE — ED Notes (Signed)
Pt back from radiology at this time. nad noted.

## 2016-12-27 NOTE — ED Triage Notes (Signed)
Patient complaining of shortness of breath after dialysis treatment today. Per son, patient was given chemo shot then brought down to ER for evaluation. Denies chest pain.

## 2016-12-27 NOTE — ED Notes (Signed)
Pt alert & oriented x4, stable gait. Patient given discharge instructions, paperwork & prescription(s). Patient verbalized understanding. Pt left department in wheelchair escorted by staff. Pt left department w/ no further questions.

## 2016-12-27 NOTE — Progress Notes (Signed)
Brian Liu presents today for injection per MD orders. velcade 2.5mg  administered IM in left Abdomen. Administration without incident. Patient tolerated well.  Patient is stating that he cannot catch his breath, feel extremely weak. Son is going to carry him down to the emergency room. [name] NT escorted them down to the ER. Schedule and calender given to family member. Follow up as scheduled

## 2016-12-27 NOTE — Progress Notes (Signed)
1350 Labs from 5/23 and 5/24 reviewed with Dr. Talbert Cage and Brian Liu approved for Velcade injection today Brian Liu just completed dialysis today and B/P 63/44. Dr. Talbert Cage informed and Brian Liu OK for Velcade per MD.

## 2016-12-31 ENCOUNTER — Encounter (HOSPITAL_COMMUNITY): Payer: Medicare Other

## 2016-12-31 ENCOUNTER — Other Ambulatory Visit (HOSPITAL_COMMUNITY): Payer: Self-pay | Admitting: Oncology

## 2016-12-31 DIAGNOSIS — C9000 Multiple myeloma not having achieved remission: Secondary | ICD-10-CM

## 2016-12-31 LAB — CBC WITH DIFFERENTIAL/PLATELET
BASOS PCT: 0 %
Basophils Absolute: 0 10*3/uL (ref 0.0–0.1)
EOS ABS: 0.1 10*3/uL (ref 0.0–0.7)
Eosinophils Relative: 2 %
HCT: 26.6 % — ABNORMAL LOW (ref 39.0–52.0)
Hemoglobin: 8.6 g/dL — ABNORMAL LOW (ref 13.0–17.0)
LYMPHS ABS: 1.3 10*3/uL (ref 0.7–4.0)
Lymphocytes Relative: 24 %
MCH: 25.1 pg — AB (ref 26.0–34.0)
MCHC: 32.3 g/dL (ref 30.0–36.0)
MCV: 77.8 fL — ABNORMAL LOW (ref 78.0–100.0)
MONO ABS: 0.6 10*3/uL (ref 0.1–1.0)
MONOS PCT: 12 %
Neutro Abs: 3.2 10*3/uL (ref 1.7–7.7)
Neutrophils Relative %: 62 %
Platelets: 65 10*3/uL — ABNORMAL LOW (ref 150–400)
RBC: 3.42 MIL/uL — ABNORMAL LOW (ref 4.22–5.81)
RDW: 15.7 % — AB (ref 11.5–15.5)
WBC: 5.2 10*3/uL (ref 4.0–10.5)

## 2016-12-31 LAB — COMPREHENSIVE METABOLIC PANEL
ALBUMIN: 3.2 g/dL — AB (ref 3.5–5.0)
ALT: 14 U/L — ABNORMAL LOW (ref 17–63)
ANION GAP: 14 (ref 5–15)
AST: 18 U/L (ref 15–41)
Alkaline Phosphatase: 67 U/L (ref 38–126)
BILIRUBIN TOTAL: 0.7 mg/dL (ref 0.3–1.2)
BUN: 45 mg/dL — ABNORMAL HIGH (ref 6–20)
CALCIUM: 8.4 mg/dL — AB (ref 8.9–10.3)
CO2: 25 mmol/L (ref 22–32)
Chloride: 100 mmol/L — ABNORMAL LOW (ref 101–111)
Creatinine, Ser: 10.57 mg/dL — ABNORMAL HIGH (ref 0.61–1.24)
GFR calc non Af Amer: 4 mL/min — ABNORMAL LOW (ref >60)
GFR, EST AFRICAN AMERICAN: 5 mL/min — AB (ref >60)
Glucose, Bld: 88 mg/dL (ref 65–99)
POTASSIUM: 3.5 mmol/L (ref 3.5–5.1)
SODIUM: 139 mmol/L (ref 135–145)
TOTAL PROTEIN: 6.8 g/dL (ref 6.5–8.1)

## 2016-12-31 NOTE — Patient Instructions (Signed)
[place] at [facility] Discharge Instructions  RECOMMENDATIONS MADE BY THE CONSULTANT AND ANY TEST RESULTS WILL BE SENT TO YOUR REFERRING PHYSICIAN.   Today you did not get chemo since you are not feeling well.   Your blood pressure is a little low.   Please get up and down slowly. Be careful in the shower. Do not get in the shower if you are dizzy. Please have your son stand close by to assist you.   You have a duoderm (dressing) on your right buttock. You have 2 tiny open areas to your right buttock that we covered with a thin dressing. This dressing is designed to last for 7 days on your body. However, if the dressing gets soiled, wet, or begins to come off - please go ahead and remove it. Please have your son look @ your right buttock to see how the open areas look. He may then apply the other duoderm (dressing called RESTORE) on your right buttock.   You are NOT to take any chemo x 2 weeks. No chemo injections in your belly and no Cytoxan (cyclophosphamide) pills @ home. We will see you back on June 12 to have lab work done and to get chemo if you are doing better.   We do want you to take your Dexamethasone (green pills) once a week as scheduled. This will help you some. Please take this medication.   Go to dialysis as you are scheduled to.   Continue to take your Ondansetron and Proclorperazine as needed for nausea. Continue to take your Imodium for diarrhea/loose stools as needed also. If you are not seeing an improvement in your nausea or diarrhea please call Sharyn Lull (Triage Nurse) @ [phone] or Anderson Malta (Nurse Navigator) @ [phone].    Thank you for choosing [place] at [facility] to provide your oncology and hematology care.  To afford each patient quality time with our provider, please arrive at least 15 minutes before your scheduled appointment time.    If you have a lab appointment with the [facility] please come  in thru the  Main Entrance and check in at the main information desk  You need to re-schedule your appointment should you arrive 10 or more minutes late.  We strive to give you quality time with our providers, and arriving late affects you and other patients whose appointments are after yours.  Also, if you no show three or more times for appointments you may be dismissed from the clinic at the providers discretion.     Again, thank you for choosing [facility].  Our hope is that these requests will decrease the amount of time that you wait before being seen by our physicians.       _____________________________________________________________  Should you have questions after your visit to [facility], please contact our office at [phone] between the hours of 8:30 a.m. and 4:30 p.m.  Voicemails left after 4:30 p.m. will not be returned until the following business day.  For prescription refill requests, have your pharmacy contact our office.       Resources For Cancer Patients and their Caregivers ? American Cancer Society: Can assist with transportation, wigs, general needs, runs Look Good Feel Better.        (305) 710-4664 ? Cancer Care: Provides financial assistance, online support groups, medication/co-pay assistance.  1-800-813-HOPE 606-571-7347) ? [place] Assists [place] cancer patients and their  families through emotional , educational and financial support.  (352) 233-1398 ? [place] DSS Where to apply for food stamps, Medicaid and utility assistance. 731-129-0450 ? [facility]: Transportation to medical appointments. 848 608 4214 ? Social Security Administration: May apply for disability if have a Stage IV cancer. 502 591 5612 (817)482-5520 ? [employer], Disability and Transit Services: Assists with nutrition, care and transit needs. [facility] Support Programs: @10RELATIVEDAYS @ > Cancer  Support Group  2nd Tuesday of the month 1pm-2pm, Journey Room  > Creative Journey  3rd Tuesday of the month 1130am-1pm, Journey Room  > Look Good Feel Better  1st Wednesday of the month 10am-12 noon, Journey Room (Call [facility] to register [phone])

## 2016-12-31 NOTE — Progress Notes (Signed)
Patient complained that he had an area on his buttock that was hurting some. We looked at buttocks and found that patient had 2 stage 1 areas to right buttock.   Wound #1: Stage 1 medial rt buttock. Round .5cm (pink colored skin) wound.  Wound #2: Stage 1 lateral rt buttock. Irregular shaped 1cm width by .3cm length (pink colored skin).  Wounds covered by one extra thin restore duoderm. Patient instructed that this could stay on up to 7 days but to remove it if it got soiled or began to fall off. Patient instructed to reposition himself frequently in bed and chair to reduce pressure points. Patient verbalized understanding.   Patient not feeling well today. Vital signs stable but BP low and patient experiencing dizziness upon standing. Pt has been in and out of the ER recently. Kirby Crigler PA-C said to hold chemo x 2 weeks except for Dexamethasone tablets. Tom said to continue taking that weekly.   I will print a discharge paper for patient that explains everything on it for patient to take home.

## 2017-01-02 DIAGNOSIS — D631 Anemia in chronic kidney disease: Secondary | ICD-10-CM | POA: Diagnosis not present

## 2017-01-02 DIAGNOSIS — Z09 Encounter for follow-up examination after completed treatment for conditions other than malignant neoplasm: Secondary | ICD-10-CM | POA: Diagnosis not present

## 2017-01-02 DIAGNOSIS — J449 Chronic obstructive pulmonary disease, unspecified: Secondary | ICD-10-CM | POA: Diagnosis not present

## 2017-01-02 DIAGNOSIS — C9000 Multiple myeloma not having achieved remission: Secondary | ICD-10-CM | POA: Diagnosis not present

## 2017-01-02 DIAGNOSIS — E7800 Pure hypercholesterolemia, unspecified: Secondary | ICD-10-CM | POA: Diagnosis not present

## 2017-01-02 DIAGNOSIS — N2581 Secondary hyperparathyroidism of renal origin: Secondary | ICD-10-CM | POA: Diagnosis not present

## 2017-01-02 DIAGNOSIS — N186 End stage renal disease: Secondary | ICD-10-CM | POA: Diagnosis not present

## 2017-01-02 DIAGNOSIS — I10 Essential (primary) hypertension: Secondary | ICD-10-CM | POA: Diagnosis not present

## 2017-01-02 DIAGNOSIS — Z992 Dependence on renal dialysis: Secondary | ICD-10-CM | POA: Diagnosis not present

## 2017-01-02 DIAGNOSIS — Z299 Encounter for prophylactic measures, unspecified: Secondary | ICD-10-CM | POA: Diagnosis not present

## 2017-01-02 DIAGNOSIS — D472 Monoclonal gammopathy: Secondary | ICD-10-CM | POA: Diagnosis not present

## 2017-01-02 DIAGNOSIS — E039 Hypothyroidism, unspecified: Secondary | ICD-10-CM | POA: Diagnosis not present

## 2017-01-02 DIAGNOSIS — I272 Pulmonary hypertension, unspecified: Secondary | ICD-10-CM | POA: Diagnosis not present

## 2017-01-02 DIAGNOSIS — I4891 Unspecified atrial fibrillation: Secondary | ICD-10-CM | POA: Diagnosis not present

## 2017-01-03 ENCOUNTER — Inpatient Hospital Stay (HOSPITAL_COMMUNITY): Payer: Medicare Other

## 2017-01-03 DIAGNOSIS — D631 Anemia in chronic kidney disease: Secondary | ICD-10-CM | POA: Diagnosis not present

## 2017-01-03 DIAGNOSIS — N186 End stage renal disease: Secondary | ICD-10-CM | POA: Diagnosis not present

## 2017-01-03 DIAGNOSIS — N2581 Secondary hyperparathyroidism of renal origin: Secondary | ICD-10-CM | POA: Diagnosis not present

## 2017-01-03 DIAGNOSIS — D509 Iron deficiency anemia, unspecified: Secondary | ICD-10-CM | POA: Diagnosis not present

## 2017-01-03 DIAGNOSIS — Z992 Dependence on renal dialysis: Secondary | ICD-10-CM | POA: Diagnosis not present

## 2017-01-03 DIAGNOSIS — D472 Monoclonal gammopathy: Secondary | ICD-10-CM | POA: Diagnosis not present

## 2017-01-04 ENCOUNTER — Encounter (HOSPITAL_COMMUNITY): Payer: Self-pay | Admitting: *Deleted

## 2017-01-04 ENCOUNTER — Emergency Department (HOSPITAL_COMMUNITY)
Admission: EM | Admit: 2017-01-04 | Discharge: 2017-01-04 | Disposition: A | Discharge status: Home / Self Care | Payer: Medicare Other | Attending: Emergency Medicine | Admitting: Emergency Medicine

## 2017-01-04 DIAGNOSIS — Z992 Dependence on renal dialysis: Secondary | ICD-10-CM | POA: Diagnosis not present

## 2017-01-04 DIAGNOSIS — Z940 Kidney transplant status: Secondary | ICD-10-CM | POA: Diagnosis not present

## 2017-01-04 DIAGNOSIS — N186 End stage renal disease: Secondary | ICD-10-CM | POA: Insufficient documentation

## 2017-01-04 DIAGNOSIS — M545 Low back pain, unspecified: Secondary | ICD-10-CM

## 2017-01-04 DIAGNOSIS — I120 Hypertensive chronic kidney disease with stage 5 chronic kidney disease or end stage renal disease: Secondary | ICD-10-CM | POA: Insufficient documentation

## 2017-01-04 MED ORDER — MORPHINE SULFATE (PF) 4 MG/ML IV SOLN
4.0000 mg | Freq: Once | INTRAVENOUS | Status: AC
  Administered 2017-01-04: 4 mg via INTRAVENOUS
  Filled 2017-01-04: qty 1

## 2017-01-04 MED ORDER — CYCLOBENZAPRINE HCL 10 MG PO TABS
10.0000 mg | ORAL_TABLET | Freq: Once | ORAL | Status: AC
  Administered 2017-01-04: 10 mg via ORAL
  Filled 2017-01-04: qty 1

## 2017-01-04 MED ORDER — CYCLOBENZAPRINE HCL 10 MG PO TABS: 10.0000 mg | ORAL_TABLET | Freq: Two times a day (BID) | ORAL | 0 refills | Status: DC | PRN

## 2017-01-04 MED ORDER — OXYCODONE-ACETAMINOPHEN 5-325 MG PO TABS: 1.0000 | ORAL_TABLET | ORAL | 0 refills | Status: DC | PRN

## 2017-01-04 NOTE — ED Provider Notes (Signed)
[place] DEPT Provider Note   CSN: [id number] Arrival date & time: 01/04/17  0437     History   Chief Complaint Chief Complaint  Patient presents with  . Back Pain    HPI Brian RODRIGUE is a 66 y.o. male.  The history is provided by the patient.  He has a history of atrial fibrillation and multiple myeloma. He complains of pain across his upper lumbar area which started about 36 hours ago. Pain is severe and worse with certain body positions. Nothing makes it better. There is no radiation of pain. He is a dialysis patient and does not make urine. He denies fever or chills or sweats. There has been no nausea or vomiting. He denies constipation or diarrhea. He states his legs feel generally weak but no focal weakness. He denies numbness or tingling. He applied icy hot at home with no relief. He has not taken any medication for it. He states he had a similar episode about one week ago which resolved.  Past Medical History:  Diagnosis Date  . Atrial fibrillation (HCC)    Permanent, Not on Coumadin because of history of lower GI bleed  . Diastolic dysfunction    Restrictive  diastolic filling pattern echo,  February, 2013  . Dyslipidemia   . Ejection fraction    EF 60-65%, echo, 2013  . ESRD (end stage renal disease) ([place])    Dialysis, fistula in left arm  . Hypertension   . Lower gastrointestinal bleed   . LVH (left ventricular hypertrophy)   . 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 Mitral regurgitation    Mild by echo, 2013  . Multiple myeloma ([place]) 04/30/2016  . Pancytopenia ([place])    Dr Ignacia Bayley  . Pulmonary hypertension (HCC)    Severe, PA pressure 65-70 mm of mercury, echo, 2013  . Tobacco abuse     Patient Active Problem List   Diagnosis Date Noted  . Elevated troponin   . Dehydration 12/24/2016  . ESRD on hemodialysis ([place]) 12/24/2016  . Nausea vomiting and diarrhea 12/24/2016  . Enteritis 12/04/2016  . Thrush 12/04/2016  .  Pancreatitis 12/02/2016  . Hypokalemia 12/02/2016  . Anemia 12/02/2016  . Abdominal pain   . Multiple myeloma ([place]) 04/30/2016  . 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 Complication of dialysis access insertion 05/17/2014  . ESRD (end stage renal disease) ([place])   . Atrial fibrillation ([place])   . Hypertension   . Pulmonary hypertension ([place])   . Mitral regurgitation   . Tobacco abuse   . Dyslipidemia   . Lower gastrointestinal bleed   . Pancytopenia ([place])   . Ejection fraction   . Diastolic dysfunction   . LVH (left ventricular hypertrophy)     Past Surgical History:  Procedure Laterality Date  . AV FISTULA PLACEMENT Left 1992   Left arm   . 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 Authorizing Provider  acyclovir (ZOVIRAX) 400 MG tablet Take 1 tablet (400 mg total) by mouth 2 (two) times daily. Patient not taking: Reported on 12/31/2016 10/25/16   Baird Cancer, PA-C  Bortezomib (VELCADE IJ) Inject as directed. Days 1, 4, 8, 11 every 21 days.    [provider]  Bromfenac Sodium (PROLENSA) 0.07 % SOLN Apply 1 drop to eye daily.  [provider]  cinacalcet (SENSIPAR) 30 MG tablet Take 30 mg by mouth.    [provider]  cyclophosphamide (CYTOXAN) 50 MG tablet Take 300 mg by mouth daily. Give on an empty stomach 1 hour before or 2 hours after meals.    [provider]  cycloSPORINE (RESTASIS) 0.05 % ophthalmic emulsion Place 1 drop into both eyes 2 (two) times daily.    [provider]  dexamethasone (DECADRON) 4 MG tablet Take 10 tablets (40 mg) on days 1, 8, and 15 of chemo. Repeat every 21 days. Take 2 tablets (67m) daily for 2 days starting after days 1 and 8 of chemotherapy. 10/25/16   KBaird Cancer PA-C  Difluprednate (DUREZOL) 0.05 % EMUL Place 1 drop into the right eye 6 (six)  times daily.    [provider]  folic acid (FOLVITE) 1 MG tablet Take 1 mg by mouth daily.    [provider]  gabapentin (NEURONTIN) 300 MG capsule Take 300 mg by mouth 3 (three) times daily.    [provider]  lanthanum (FOSRENOL) 500 MG chewable tablet Chew 1,000 mg by mouth 3 (three) times daily with meals. And 1 with snacks    [provider]  latanoprost (XALATAN) 0.005 % ophthalmic solution Place 1 drop into both eyes at bedtime. 07/13/14   [provider]  levothyroxine (SYNTHROID, LEVOTHROID) 137 MCG tablet Take 137 mcg by mouth daily before breakfast.    [provider]  megestrol (MEGACE) 20 MG tablet Take 1 tablet (20 mg total) by mouth 2 (two) times daily. 12/17/16   ZTwana First MD  midodrine (PROAMATINE) 10 MG tablet Take 10 mg by mouth 2 (two) times daily.  10/26/16   [provider]  Multiple Vitamin (DAILY VITE) TABS Take 1 tablet by mouth daily.     [provider]  ondansetron (ZOFRAN) 8 MG tablet Take 1 tablet (8 mg total) by mouth every 8 (eight) hours as needed for nausea or vomiting. 10/29/16   KBaird Cancer PA-C  prochlorperazine (COMPAZINE) 10 MG tablet Take 1 tablet (10 mg total) by mouth every 6 (six) hours as needed for nausea or vomiting. 10/29/16   Kefalas, TManon Hilding PA-C  SIMBRINZA 1-0.2 % SUSP INSTILL 1 DROP IN INTO BOTH EYES TWICE A DAY 01/03/15   [provider]  simvastatin (ZOCOR) 20 MG tablet Take 20 mg by mouth daily with supper.      [provider]    Family History Family History  Problem Relation Age of Onset  . Other Mother        Deceased, 854 . Stomach cancer Father        Deceased, 663 . Kidney failure Son   . Healthy Son     Social History Social History  Substance Use Topics  . Smoking status: Never Smoker  . Smokeless tobacco: Never Used  . Alcohol use No     Allergies   Other   Review of Systems Review of Systems  All other systems  reviewed and are negative.    Physical Exam Updated Vital Signs BP 111/77 (BP Location: Right Arm)   Pulse 79   Temp 97.6 F (36.4 C) (Oral)   Resp 18   Ht '6\' 1"'  (1.854 m)   Wt 66.7 kg (147 lb)   SpO2 98%   BMI 19.39 kg/m   Physical Exam  Nursing note and vitals reviewed.  66year old male, appears uncomfortable, but is  in no acute distress. Vital signs are normal. Oxygen saturation is 98%, which is normal. Head is normocephalic and atraumatic. PERRLA, EOMI. Oropharynx is clear. Neck is nontender and supple without adenopathy or JVD. Back is nontender in the midline. There is mild bilateral CVA tenderness. Straight leg raise is negative. Lungs are clear without rales, wheezes, or rhonchi. Chest is nontender. Heart has regular rate and rhythm without murmur. Abdomen is soft, flat, nontender without masses or hepatosplenomegaly and peristalsis is normoactive. Extremities have no cyanosis or edema, full range of motion is present. AV fistula is present in the left upper arm with thrill present. Skin is warm and dry without rash. Neurologic: Mental status is normal, cranial nerves are intact, there are no motor or sensory deficits.  ED Treatments / Results   Procedures Procedures (including critical care time)  Medications Ordered in ED Medications  morphine 4 MG/ML injection 4 mg (not administered)  cyclobenzaprine (FLEXERIL) tablet 10 mg (not administered)     Initial Impression / Assessment and Plan / ED Course  I have reviewed the triage vital signs and the nursing notes.  Pertinent labs & imaging results that were available during my care of the patient were reviewed by me and considered in my medical decision making (see chart for details).  Upper lumbar pain of uncertain cause. Old records are reviewed, and he was seen in the ED 1 week ago for hypotension and CT of abdomen and pelvis showed some lytic lesions in the lumbar spine, but nothing that would account for  his pain today. He is given morphine and oral cyclobenzaprine. With recent CT scan, I do not see an indication for any imaging today.  He had good relief of pain with above noted treatment. He is discharged with prescriptions for oxycodone-acetaminophen, and cyclobenzaprine. Follow-up with PCP as needed.  Final Clinical Impressions(s) / ED Diagnoses   Final diagnoses:  Acute bilateral low back pain without sciatica    New Prescriptions New Prescriptions   CYCLOBENZAPRINE (FLEXERIL) 10 MG TABLET    Take 1 tablet (10 mg total) by mouth 2 (two) times daily as needed for muscle spasms.   OXYCODONE-ACETAMINOPHEN (PERCOCET) 5-325 MG TABLET    Take 1 tablet by mouth every 4 (four) hours as needed for moderate pain.     Delora Fuel, MD 16/10/96 [phone]

## 2017-01-04 NOTE — ED Triage Notes (Signed)
Pt c/o lower back pain for the last couple of days; pt denies any injury

## 2017-01-07 DIAGNOSIS — E860 Dehydration: Secondary | ICD-10-CM | POA: Diagnosis not present

## 2017-01-07 DIAGNOSIS — I482 Chronic atrial fibrillation: Secondary | ICD-10-CM | POA: Diagnosis not present

## 2017-01-07 DIAGNOSIS — N186 End stage renal disease: Secondary | ICD-10-CM | POA: Diagnosis not present

## 2017-01-07 DIAGNOSIS — Z992 Dependence on renal dialysis: Secondary | ICD-10-CM | POA: Diagnosis not present

## 2017-01-07 DIAGNOSIS — I120 Hypertensive chronic kidney disease with stage 5 chronic kidney disease or end stage renal disease: Secondary | ICD-10-CM | POA: Diagnosis not present

## 2017-01-07 DIAGNOSIS — I272 Pulmonary hypertension, unspecified: Secondary | ICD-10-CM | POA: Diagnosis not present

## 2017-01-07 DIAGNOSIS — C9000 Multiple myeloma not having achieved remission: Secondary | ICD-10-CM | POA: Diagnosis not present

## 2017-01-07 DIAGNOSIS — I340 Nonrheumatic mitral (valve) insufficiency: Secondary | ICD-10-CM | POA: Diagnosis not present

## 2017-01-08 DIAGNOSIS — M545 Low back pain: Secondary | ICD-10-CM | POA: Diagnosis not present

## 2017-01-08 DIAGNOSIS — C9000 Multiple myeloma not having achieved remission: Secondary | ICD-10-CM | POA: Diagnosis not present

## 2017-01-08 DIAGNOSIS — D472 Monoclonal gammopathy: Secondary | ICD-10-CM | POA: Diagnosis not present

## 2017-01-08 DIAGNOSIS — M5136 Other intervertebral disc degeneration, lumbar region: Secondary | ICD-10-CM | POA: Diagnosis not present

## 2017-01-08 DIAGNOSIS — Z992 Dependence on renal dialysis: Secondary | ICD-10-CM | POA: Diagnosis not present

## 2017-01-08 DIAGNOSIS — D509 Iron deficiency anemia, unspecified: Secondary | ICD-10-CM | POA: Diagnosis not present

## 2017-01-08 DIAGNOSIS — J90 Pleural effusion, not elsewhere classified: Secondary | ICD-10-CM | POA: Diagnosis not present

## 2017-01-08 DIAGNOSIS — I132 Hypertensive heart and chronic kidney disease with heart failure and with stage 5 chronic kidney disease, or end stage renal disease: Secondary | ICD-10-CM | POA: Diagnosis not present

## 2017-01-08 DIAGNOSIS — R29898 Other symptoms and signs involving the musculoskeletal system: Secondary | ICD-10-CM | POA: Diagnosis not present

## 2017-01-08 DIAGNOSIS — R55 Syncope and collapse: Secondary | ICD-10-CM | POA: Diagnosis not present

## 2017-01-08 DIAGNOSIS — I5022 Chronic systolic (congestive) heart failure: Secondary | ICD-10-CM | POA: Diagnosis not present

## 2017-01-08 DIAGNOSIS — N186 End stage renal disease: Secondary | ICD-10-CM | POA: Diagnosis not present

## 2017-01-08 DIAGNOSIS — I951 Orthostatic hypotension: Secondary | ICD-10-CM | POA: Diagnosis not present

## 2017-01-08 DIAGNOSIS — R404 Transient alteration of awareness: Secondary | ICD-10-CM | POA: Diagnosis not present

## 2017-01-08 DIAGNOSIS — R531 Weakness: Secondary | ICD-10-CM | POA: Diagnosis not present

## 2017-01-08 DIAGNOSIS — D631 Anemia in chronic kidney disease: Secondary | ICD-10-CM | POA: Diagnosis not present

## 2017-01-08 DIAGNOSIS — N2581 Secondary hyperparathyroidism of renal origin: Secondary | ICD-10-CM | POA: Diagnosis not present

## 2017-01-08 DIAGNOSIS — G92 Toxic encephalopathy: Secondary | ICD-10-CM | POA: Diagnosis not present

## 2017-01-09 DIAGNOSIS — Z992 Dependence on renal dialysis: Secondary | ICD-10-CM | POA: Diagnosis not present

## 2017-01-09 DIAGNOSIS — C9000 Multiple myeloma not having achieved remission: Secondary | ICD-10-CM | POA: Diagnosis not present

## 2017-01-09 DIAGNOSIS — G92 Toxic encephalopathy: Secondary | ICD-10-CM | POA: Diagnosis not present

## 2017-01-09 DIAGNOSIS — I951 Orthostatic hypotension: Secondary | ICD-10-CM | POA: Diagnosis not present

## 2017-01-09 DIAGNOSIS — D631 Anemia in chronic kidney disease: Secondary | ICD-10-CM | POA: Diagnosis not present

## 2017-01-09 DIAGNOSIS — N186 End stage renal disease: Secondary | ICD-10-CM | POA: Diagnosis not present

## 2017-01-09 DIAGNOSIS — I959 Hypotension, unspecified: Secondary | ICD-10-CM | POA: Diagnosis not present

## 2017-01-09 DIAGNOSIS — R55 Syncope and collapse: Secondary | ICD-10-CM | POA: Diagnosis not present

## 2017-01-10 ENCOUNTER — Other Ambulatory Visit (HOSPITAL_COMMUNITY): Payer: Medicare Other

## 2017-01-10 ENCOUNTER — Inpatient Hospital Stay (HOSPITAL_COMMUNITY): Payer: Medicare Other

## 2017-01-10 DIAGNOSIS — I5022 Chronic systolic (congestive) heart failure: Secondary | ICD-10-CM | POA: Diagnosis present

## 2017-01-10 DIAGNOSIS — H409 Unspecified glaucoma: Secondary | ICD-10-CM | POA: Diagnosis present

## 2017-01-10 DIAGNOSIS — E871 Hypo-osmolality and hyponatremia: Secondary | ICD-10-CM | POA: Diagnosis present

## 2017-01-10 DIAGNOSIS — Z992 Dependence on renal dialysis: Secondary | ICD-10-CM | POA: Diagnosis not present

## 2017-01-10 DIAGNOSIS — M898X9 Other specified disorders of bone, unspecified site: Secondary | ICD-10-CM | POA: Diagnosis present

## 2017-01-10 DIAGNOSIS — D631 Anemia in chronic kidney disease: Secondary | ICD-10-CM | POA: Diagnosis not present

## 2017-01-10 DIAGNOSIS — G92 Toxic encephalopathy: Secondary | ICD-10-CM | POA: Diagnosis not present

## 2017-01-10 DIAGNOSIS — M5136 Other intervertebral disc degeneration, lumbar region: Secondary | ICD-10-CM | POA: Diagnosis present

## 2017-01-10 DIAGNOSIS — N186 End stage renal disease: Secondary | ICD-10-CM | POA: Diagnosis not present

## 2017-01-10 DIAGNOSIS — I272 Pulmonary hypertension, unspecified: Secondary | ICD-10-CM | POA: Diagnosis present

## 2017-01-10 DIAGNOSIS — R2689 Other abnormalities of gait and mobility: Secondary | ICD-10-CM | POA: Diagnosis not present

## 2017-01-10 DIAGNOSIS — R197 Diarrhea, unspecified: Secondary | ICD-10-CM | POA: Diagnosis not present

## 2017-01-10 DIAGNOSIS — R531 Weakness: Secondary | ICD-10-CM | POA: Diagnosis not present

## 2017-01-10 DIAGNOSIS — Z8521 Personal history of malignant neoplasm of larynx: Secondary | ICD-10-CM | POA: Diagnosis not present

## 2017-01-10 DIAGNOSIS — R55 Syncope and collapse: Secondary | ICD-10-CM | POA: Diagnosis not present

## 2017-01-10 DIAGNOSIS — D638 Anemia in other chronic diseases classified elsewhere: Secondary | ICD-10-CM | POA: Diagnosis present

## 2017-01-10 DIAGNOSIS — I4891 Unspecified atrial fibrillation: Secondary | ICD-10-CM | POA: Diagnosis present

## 2017-01-10 DIAGNOSIS — E039 Hypothyroidism, unspecified: Secondary | ICD-10-CM | POA: Diagnosis not present

## 2017-01-10 DIAGNOSIS — E785 Hyperlipidemia, unspecified: Secondary | ICD-10-CM | POA: Diagnosis not present

## 2017-01-10 DIAGNOSIS — M6281 Muscle weakness (generalized): Secondary | ICD-10-CM | POA: Diagnosis not present

## 2017-01-10 DIAGNOSIS — R278 Other lack of coordination: Secondary | ICD-10-CM | POA: Diagnosis not present

## 2017-01-10 DIAGNOSIS — I951 Orthostatic hypotension: Secondary | ICD-10-CM | POA: Diagnosis not present

## 2017-01-10 DIAGNOSIS — Z79891 Long term (current) use of opiate analgesic: Secondary | ICD-10-CM | POA: Diagnosis not present

## 2017-01-10 DIAGNOSIS — C9000 Multiple myeloma not having achieved remission: Secondary | ICD-10-CM | POA: Diagnosis not present

## 2017-01-10 DIAGNOSIS — R410 Disorientation, unspecified: Secondary | ICD-10-CM | POA: Diagnosis not present

## 2017-01-10 DIAGNOSIS — D649 Anemia, unspecified: Secondary | ICD-10-CM | POA: Diagnosis not present

## 2017-01-10 DIAGNOSIS — Z79899 Other long term (current) drug therapy: Secondary | ICD-10-CM | POA: Diagnosis not present

## 2017-01-10 DIAGNOSIS — I132 Hypertensive heart and chronic kidney disease with heart failure and with stage 5 chronic kidney disease, or end stage renal disease: Secondary | ICD-10-CM | POA: Diagnosis present

## 2017-01-10 DIAGNOSIS — E876 Hypokalemia: Secondary | ICD-10-CM | POA: Diagnosis present

## 2017-01-10 DIAGNOSIS — I959 Hypotension, unspecified: Secondary | ICD-10-CM | POA: Diagnosis not present

## 2017-01-10 DIAGNOSIS — R1311 Dysphagia, oral phase: Secondary | ICD-10-CM | POA: Diagnosis not present

## 2017-01-13 ENCOUNTER — Inpatient Hospital Stay (HOSPITAL_COMMUNITY): Payer: Medicare Other

## 2017-01-14 ENCOUNTER — Inpatient Hospital Stay (HOSPITAL_COMMUNITY): Payer: Medicare Other

## 2017-01-14 ENCOUNTER — Other Ambulatory Visit (HOSPITAL_COMMUNITY): Payer: Medicare Other

## 2017-01-16 DIAGNOSIS — R2689 Other abnormalities of gait and mobility: Secondary | ICD-10-CM | POA: Diagnosis not present

## 2017-01-16 DIAGNOSIS — Z992 Dependence on renal dialysis: Secondary | ICD-10-CM | POA: Diagnosis not present

## 2017-01-16 DIAGNOSIS — D472 Monoclonal gammopathy: Secondary | ICD-10-CM | POA: Diagnosis not present

## 2017-01-16 DIAGNOSIS — R197 Diarrhea, unspecified: Secondary | ICD-10-CM | POA: Diagnosis not present

## 2017-01-16 DIAGNOSIS — R270 Ataxia, unspecified: Secondary | ICD-10-CM | POA: Diagnosis not present

## 2017-01-16 DIAGNOSIS — M6281 Muscle weakness (generalized): Secondary | ICD-10-CM | POA: Diagnosis not present

## 2017-01-16 DIAGNOSIS — R1311 Dysphagia, oral phase: Secondary | ICD-10-CM | POA: Diagnosis not present

## 2017-01-16 DIAGNOSIS — R410 Disorientation, unspecified: Secondary | ICD-10-CM | POA: Diagnosis not present

## 2017-01-16 DIAGNOSIS — N2581 Secondary hyperparathyroidism of renal origin: Secondary | ICD-10-CM | POA: Diagnosis not present

## 2017-01-16 DIAGNOSIS — C9000 Multiple myeloma not having achieved remission: Secondary | ICD-10-CM | POA: Diagnosis not present

## 2017-01-16 DIAGNOSIS — D631 Anemia in chronic kidney disease: Secondary | ICD-10-CM | POA: Diagnosis not present

## 2017-01-16 DIAGNOSIS — I951 Orthostatic hypotension: Secondary | ICD-10-CM | POA: Diagnosis not present

## 2017-01-16 DIAGNOSIS — G92 Toxic encephalopathy: Secondary | ICD-10-CM | POA: Diagnosis not present

## 2017-01-16 DIAGNOSIS — R55 Syncope and collapse: Secondary | ICD-10-CM | POA: Diagnosis not present

## 2017-01-16 DIAGNOSIS — M7989 Other specified soft tissue disorders: Secondary | ICD-10-CM | POA: Diagnosis not present

## 2017-01-16 DIAGNOSIS — R531 Weakness: Secondary | ICD-10-CM | POA: Diagnosis not present

## 2017-01-16 DIAGNOSIS — D649 Anemia, unspecified: Secondary | ICD-10-CM | POA: Diagnosis not present

## 2017-01-16 DIAGNOSIS — H401133 Primary open-angle glaucoma, bilateral, severe stage: Secondary | ICD-10-CM | POA: Diagnosis not present

## 2017-01-16 DIAGNOSIS — R05 Cough: Secondary | ICD-10-CM | POA: Diagnosis not present

## 2017-01-16 DIAGNOSIS — R278 Other lack of coordination: Secondary | ICD-10-CM | POA: Diagnosis not present

## 2017-01-16 DIAGNOSIS — E039 Hypothyroidism, unspecified: Secondary | ICD-10-CM | POA: Diagnosis not present

## 2017-01-16 DIAGNOSIS — N186 End stage renal disease: Secondary | ICD-10-CM | POA: Diagnosis not present

## 2017-01-16 DIAGNOSIS — E785 Hyperlipidemia, unspecified: Secondary | ICD-10-CM | POA: Diagnosis not present

## 2017-01-16 DIAGNOSIS — G5600 Carpal tunnel syndrome, unspecified upper limb: Secondary | ICD-10-CM | POA: Diagnosis not present

## 2017-01-16 DIAGNOSIS — D638 Anemia in other chronic diseases classified elsewhere: Secondary | ICD-10-CM | POA: Diagnosis not present

## 2017-01-16 DIAGNOSIS — I770 Arteriovenous fistula, acquired: Secondary | ICD-10-CM | POA: Diagnosis not present

## 2017-01-16 DIAGNOSIS — H469 Unspecified optic neuritis: Secondary | ICD-10-CM | POA: Diagnosis not present

## 2017-01-16 DIAGNOSIS — D509 Iron deficiency anemia, unspecified: Secondary | ICD-10-CM | POA: Diagnosis not present

## 2017-01-16 DIAGNOSIS — I959 Hypotension, unspecified: Secondary | ICD-10-CM | POA: Diagnosis not present

## 2017-01-17 ENCOUNTER — Other Ambulatory Visit (HOSPITAL_COMMUNITY): Payer: Medicare Other

## 2017-01-17 ENCOUNTER — Inpatient Hospital Stay (HOSPITAL_COMMUNITY): Payer: Medicare Other

## 2017-01-17 ENCOUNTER — Ambulatory Visit (HOSPITAL_COMMUNITY): Payer: Medicare Other

## 2017-01-17 ENCOUNTER — Ambulatory Visit (HOSPITAL_COMMUNITY): Payer: Medicare Other | Admitting: Hematology

## 2017-01-17 DIAGNOSIS — D509 Iron deficiency anemia, unspecified: Secondary | ICD-10-CM | POA: Diagnosis not present

## 2017-01-17 DIAGNOSIS — D631 Anemia in chronic kidney disease: Secondary | ICD-10-CM | POA: Diagnosis not present

## 2017-01-17 DIAGNOSIS — D638 Anemia in other chronic diseases classified elsewhere: Secondary | ICD-10-CM | POA: Diagnosis not present

## 2017-01-17 DIAGNOSIS — N2581 Secondary hyperparathyroidism of renal origin: Secondary | ICD-10-CM | POA: Diagnosis not present

## 2017-01-17 DIAGNOSIS — Z992 Dependence on renal dialysis: Secondary | ICD-10-CM | POA: Diagnosis not present

## 2017-01-17 DIAGNOSIS — I951 Orthostatic hypotension: Secondary | ICD-10-CM | POA: Diagnosis not present

## 2017-01-17 DIAGNOSIS — N186 End stage renal disease: Secondary | ICD-10-CM | POA: Diagnosis not present

## 2017-01-17 DIAGNOSIS — D472 Monoclonal gammopathy: Secondary | ICD-10-CM | POA: Diagnosis not present

## 2017-01-20 ENCOUNTER — Inpatient Hospital Stay (HOSPITAL_COMMUNITY): Payer: Medicare Other

## 2017-01-20 DIAGNOSIS — N186 End stage renal disease: Secondary | ICD-10-CM | POA: Diagnosis not present

## 2017-01-20 DIAGNOSIS — D509 Iron deficiency anemia, unspecified: Secondary | ICD-10-CM | POA: Diagnosis not present

## 2017-01-20 DIAGNOSIS — Z992 Dependence on renal dialysis: Secondary | ICD-10-CM | POA: Diagnosis not present

## 2017-01-20 DIAGNOSIS — D472 Monoclonal gammopathy: Secondary | ICD-10-CM | POA: Diagnosis not present

## 2017-01-20 DIAGNOSIS — D631 Anemia in chronic kidney disease: Secondary | ICD-10-CM | POA: Diagnosis not present

## 2017-01-20 DIAGNOSIS — N2581 Secondary hyperparathyroidism of renal origin: Secondary | ICD-10-CM | POA: Diagnosis not present

## 2017-01-21 ENCOUNTER — Encounter (HOSPITAL_COMMUNITY): Payer: Medicare Other | Attending: Oncology

## 2017-01-21 ENCOUNTER — Other Ambulatory Visit (HOSPITAL_COMMUNITY): Payer: Medicare Other

## 2017-01-21 DIAGNOSIS — C9000 Multiple myeloma not having achieved remission: Secondary | ICD-10-CM | POA: Insufficient documentation

## 2017-01-22 DIAGNOSIS — N2581 Secondary hyperparathyroidism of renal origin: Secondary | ICD-10-CM | POA: Diagnosis not present

## 2017-01-22 DIAGNOSIS — N186 End stage renal disease: Secondary | ICD-10-CM | POA: Diagnosis not present

## 2017-01-22 DIAGNOSIS — D472 Monoclonal gammopathy: Secondary | ICD-10-CM | POA: Diagnosis not present

## 2017-01-22 DIAGNOSIS — D509 Iron deficiency anemia, unspecified: Secondary | ICD-10-CM | POA: Diagnosis not present

## 2017-01-22 DIAGNOSIS — D631 Anemia in chronic kidney disease: Secondary | ICD-10-CM | POA: Diagnosis not present

## 2017-01-22 DIAGNOSIS — Z992 Dependence on renal dialysis: Secondary | ICD-10-CM | POA: Diagnosis not present

## 2017-01-23 ENCOUNTER — Other Ambulatory Visit (HOSPITAL_COMMUNITY): Payer: Self-pay | Admitting: Oncology

## 2017-01-24 ENCOUNTER — Other Ambulatory Visit (HOSPITAL_COMMUNITY): Payer: Medicare Other

## 2017-01-24 ENCOUNTER — Inpatient Hospital Stay (HOSPITAL_COMMUNITY): Payer: Medicare Other

## 2017-01-24 DIAGNOSIS — D631 Anemia in chronic kidney disease: Secondary | ICD-10-CM | POA: Diagnosis not present

## 2017-01-24 DIAGNOSIS — N186 End stage renal disease: Secondary | ICD-10-CM | POA: Diagnosis not present

## 2017-01-24 DIAGNOSIS — I951 Orthostatic hypotension: Secondary | ICD-10-CM | POA: Diagnosis not present

## 2017-01-24 DIAGNOSIS — Z992 Dependence on renal dialysis: Secondary | ICD-10-CM | POA: Diagnosis not present

## 2017-01-24 DIAGNOSIS — R531 Weakness: Secondary | ICD-10-CM | POA: Diagnosis not present

## 2017-01-24 DIAGNOSIS — D472 Monoclonal gammopathy: Secondary | ICD-10-CM | POA: Diagnosis not present

## 2017-01-24 DIAGNOSIS — D509 Iron deficiency anemia, unspecified: Secondary | ICD-10-CM | POA: Diagnosis not present

## 2017-01-24 DIAGNOSIS — N2581 Secondary hyperparathyroidism of renal origin: Secondary | ICD-10-CM | POA: Diagnosis not present

## 2017-01-27 DIAGNOSIS — D472 Monoclonal gammopathy: Secondary | ICD-10-CM | POA: Diagnosis not present

## 2017-01-27 DIAGNOSIS — N186 End stage renal disease: Secondary | ICD-10-CM | POA: Diagnosis not present

## 2017-01-27 DIAGNOSIS — D631 Anemia in chronic kidney disease: Secondary | ICD-10-CM | POA: Diagnosis not present

## 2017-01-27 DIAGNOSIS — D509 Iron deficiency anemia, unspecified: Secondary | ICD-10-CM | POA: Diagnosis not present

## 2017-01-27 DIAGNOSIS — Z992 Dependence on renal dialysis: Secondary | ICD-10-CM | POA: Diagnosis not present

## 2017-01-27 DIAGNOSIS — N2581 Secondary hyperparathyroidism of renal origin: Secondary | ICD-10-CM | POA: Diagnosis not present

## 2017-01-29 DIAGNOSIS — Z992 Dependence on renal dialysis: Secondary | ICD-10-CM | POA: Diagnosis not present

## 2017-01-29 DIAGNOSIS — D472 Monoclonal gammopathy: Secondary | ICD-10-CM | POA: Diagnosis not present

## 2017-01-29 DIAGNOSIS — N186 End stage renal disease: Secondary | ICD-10-CM | POA: Diagnosis not present

## 2017-01-29 DIAGNOSIS — D509 Iron deficiency anemia, unspecified: Secondary | ICD-10-CM | POA: Diagnosis not present

## 2017-01-29 DIAGNOSIS — N2581 Secondary hyperparathyroidism of renal origin: Secondary | ICD-10-CM | POA: Diagnosis not present

## 2017-01-29 DIAGNOSIS — D631 Anemia in chronic kidney disease: Secondary | ICD-10-CM | POA: Diagnosis not present

## 2017-01-31 DIAGNOSIS — D631 Anemia in chronic kidney disease: Secondary | ICD-10-CM | POA: Diagnosis not present

## 2017-01-31 DIAGNOSIS — N186 End stage renal disease: Secondary | ICD-10-CM | POA: Diagnosis not present

## 2017-01-31 DIAGNOSIS — N2581 Secondary hyperparathyroidism of renal origin: Secondary | ICD-10-CM | POA: Diagnosis not present

## 2017-01-31 DIAGNOSIS — D509 Iron deficiency anemia, unspecified: Secondary | ICD-10-CM | POA: Diagnosis not present

## 2017-01-31 DIAGNOSIS — Z992 Dependence on renal dialysis: Secondary | ICD-10-CM | POA: Diagnosis not present

## 2017-01-31 DIAGNOSIS — D472 Monoclonal gammopathy: Secondary | ICD-10-CM | POA: Diagnosis not present

## 2017-02-01 DIAGNOSIS — Z992 Dependence on renal dialysis: Secondary | ICD-10-CM | POA: Diagnosis not present

## 2017-02-01 DIAGNOSIS — N186 End stage renal disease: Secondary | ICD-10-CM | POA: Diagnosis not present

## 2017-02-02 DIAGNOSIS — D472 Monoclonal gammopathy: Secondary | ICD-10-CM | POA: Diagnosis not present

## 2017-02-02 DIAGNOSIS — N186 End stage renal disease: Secondary | ICD-10-CM | POA: Diagnosis not present

## 2017-02-02 DIAGNOSIS — D631 Anemia in chronic kidney disease: Secondary | ICD-10-CM | POA: Diagnosis not present

## 2017-02-02 DIAGNOSIS — Z992 Dependence on renal dialysis: Secondary | ICD-10-CM | POA: Diagnosis not present

## 2017-02-02 DIAGNOSIS — N2581 Secondary hyperparathyroidism of renal origin: Secondary | ICD-10-CM | POA: Diagnosis not present

## 2017-02-03 DIAGNOSIS — N2581 Secondary hyperparathyroidism of renal origin: Secondary | ICD-10-CM | POA: Diagnosis not present

## 2017-02-03 DIAGNOSIS — D472 Monoclonal gammopathy: Secondary | ICD-10-CM | POA: Diagnosis not present

## 2017-02-03 DIAGNOSIS — N186 End stage renal disease: Secondary | ICD-10-CM | POA: Diagnosis not present

## 2017-02-03 DIAGNOSIS — D631 Anemia in chronic kidney disease: Secondary | ICD-10-CM | POA: Diagnosis not present

## 2017-02-03 DIAGNOSIS — Z992 Dependence on renal dialysis: Secondary | ICD-10-CM | POA: Diagnosis not present

## 2017-02-04 DIAGNOSIS — R197 Diarrhea, unspecified: Secondary | ICD-10-CM | POA: Diagnosis not present

## 2017-02-04 DIAGNOSIS — I951 Orthostatic hypotension: Secondary | ICD-10-CM | POA: Diagnosis not present

## 2017-02-04 DIAGNOSIS — N186 End stage renal disease: Secondary | ICD-10-CM | POA: Diagnosis not present

## 2017-02-04 DIAGNOSIS — G5600 Carpal tunnel syndrome, unspecified upper limb: Secondary | ICD-10-CM | POA: Diagnosis not present

## 2017-02-05 DIAGNOSIS — Z992 Dependence on renal dialysis: Secondary | ICD-10-CM | POA: Diagnosis not present

## 2017-02-05 DIAGNOSIS — D472 Monoclonal gammopathy: Secondary | ICD-10-CM | POA: Diagnosis not present

## 2017-02-05 DIAGNOSIS — D631 Anemia in chronic kidney disease: Secondary | ICD-10-CM | POA: Diagnosis not present

## 2017-02-05 DIAGNOSIS — N2581 Secondary hyperparathyroidism of renal origin: Secondary | ICD-10-CM | POA: Diagnosis not present

## 2017-02-05 DIAGNOSIS — N186 End stage renal disease: Secondary | ICD-10-CM | POA: Diagnosis not present

## 2017-02-07 DIAGNOSIS — N186 End stage renal disease: Secondary | ICD-10-CM | POA: Diagnosis not present

## 2017-02-07 DIAGNOSIS — D472 Monoclonal gammopathy: Secondary | ICD-10-CM | POA: Diagnosis not present

## 2017-02-07 DIAGNOSIS — N2581 Secondary hyperparathyroidism of renal origin: Secondary | ICD-10-CM | POA: Diagnosis not present

## 2017-02-07 DIAGNOSIS — D631 Anemia in chronic kidney disease: Secondary | ICD-10-CM | POA: Diagnosis not present

## 2017-02-07 DIAGNOSIS — Z992 Dependence on renal dialysis: Secondary | ICD-10-CM | POA: Diagnosis not present

## 2017-02-10 DIAGNOSIS — N186 End stage renal disease: Secondary | ICD-10-CM | POA: Diagnosis not present

## 2017-02-10 DIAGNOSIS — D631 Anemia in chronic kidney disease: Secondary | ICD-10-CM | POA: Diagnosis not present

## 2017-02-10 DIAGNOSIS — Z992 Dependence on renal dialysis: Secondary | ICD-10-CM | POA: Diagnosis not present

## 2017-02-10 DIAGNOSIS — N2581 Secondary hyperparathyroidism of renal origin: Secondary | ICD-10-CM | POA: Diagnosis not present

## 2017-02-10 DIAGNOSIS — D472 Monoclonal gammopathy: Secondary | ICD-10-CM | POA: Diagnosis not present

## 2017-02-11 DIAGNOSIS — N186 End stage renal disease: Secondary | ICD-10-CM | POA: Diagnosis not present

## 2017-02-11 DIAGNOSIS — Z992 Dependence on renal dialysis: Secondary | ICD-10-CM | POA: Diagnosis not present

## 2017-02-11 DIAGNOSIS — R270 Ataxia, unspecified: Secondary | ICD-10-CM | POA: Diagnosis not present

## 2017-02-11 DIAGNOSIS — I951 Orthostatic hypotension: Secondary | ICD-10-CM | POA: Diagnosis not present

## 2017-02-12 DIAGNOSIS — N186 End stage renal disease: Secondary | ICD-10-CM | POA: Diagnosis not present

## 2017-02-12 DIAGNOSIS — N2581 Secondary hyperparathyroidism of renal origin: Secondary | ICD-10-CM | POA: Diagnosis not present

## 2017-02-12 DIAGNOSIS — D472 Monoclonal gammopathy: Secondary | ICD-10-CM | POA: Diagnosis not present

## 2017-02-12 DIAGNOSIS — D631 Anemia in chronic kidney disease: Secondary | ICD-10-CM | POA: Diagnosis not present

## 2017-02-12 DIAGNOSIS — Z992 Dependence on renal dialysis: Secondary | ICD-10-CM | POA: Diagnosis not present

## 2017-02-14 DIAGNOSIS — Z992 Dependence on renal dialysis: Secondary | ICD-10-CM | POA: Diagnosis not present

## 2017-02-14 DIAGNOSIS — N2581 Secondary hyperparathyroidism of renal origin: Secondary | ICD-10-CM | POA: Diagnosis not present

## 2017-02-14 DIAGNOSIS — D472 Monoclonal gammopathy: Secondary | ICD-10-CM | POA: Diagnosis not present

## 2017-02-14 DIAGNOSIS — D631 Anemia in chronic kidney disease: Secondary | ICD-10-CM | POA: Diagnosis not present

## 2017-02-14 DIAGNOSIS — N186 End stage renal disease: Secondary | ICD-10-CM | POA: Diagnosis not present

## 2017-02-16 DIAGNOSIS — D472 Monoclonal gammopathy: Secondary | ICD-10-CM | POA: Diagnosis not present

## 2017-02-16 DIAGNOSIS — Z992 Dependence on renal dialysis: Secondary | ICD-10-CM | POA: Diagnosis not present

## 2017-02-16 DIAGNOSIS — D631 Anemia in chronic kidney disease: Secondary | ICD-10-CM | POA: Diagnosis not present

## 2017-02-16 DIAGNOSIS — N186 End stage renal disease: Secondary | ICD-10-CM | POA: Diagnosis not present

## 2017-02-16 DIAGNOSIS — N2581 Secondary hyperparathyroidism of renal origin: Secondary | ICD-10-CM | POA: Diagnosis not present

## 2017-02-17 ENCOUNTER — Encounter: Payer: Self-pay | Admitting: Family

## 2017-02-17 DIAGNOSIS — N2581 Secondary hyperparathyroidism of renal origin: Secondary | ICD-10-CM | POA: Diagnosis not present

## 2017-02-17 DIAGNOSIS — N186 End stage renal disease: Secondary | ICD-10-CM | POA: Diagnosis not present

## 2017-02-17 DIAGNOSIS — Z992 Dependence on renal dialysis: Secondary | ICD-10-CM | POA: Diagnosis not present

## 2017-02-17 DIAGNOSIS — D631 Anemia in chronic kidney disease: Secondary | ICD-10-CM | POA: Diagnosis not present

## 2017-02-17 DIAGNOSIS — D472 Monoclonal gammopathy: Secondary | ICD-10-CM | POA: Diagnosis not present

## 2017-02-18 ENCOUNTER — Ambulatory Visit (INDEPENDENT_AMBULATORY_CARE_PROVIDER_SITE_OTHER): Payer: Medicare Other | Admitting: Family

## 2017-02-18 ENCOUNTER — Encounter: Payer: Self-pay | Admitting: Family

## 2017-02-18 VITALS — BP 113/75 | HR 88 | Temp 97.0°F | Resp 16 | Ht 73.0 in | Wt 145.0 lb

## 2017-02-18 DIAGNOSIS — N186 End stage renal disease: Secondary | ICD-10-CM | POA: Diagnosis not present

## 2017-02-18 DIAGNOSIS — M7989 Other specified soft tissue disorders: Secondary | ICD-10-CM

## 2017-02-18 DIAGNOSIS — I770 Arteriovenous fistula, acquired: Secondary | ICD-10-CM | POA: Diagnosis not present

## 2017-02-18 DIAGNOSIS — D631 Anemia in chronic kidney disease: Secondary | ICD-10-CM | POA: Diagnosis not present

## 2017-02-18 DIAGNOSIS — N2581 Secondary hyperparathyroidism of renal origin: Secondary | ICD-10-CM | POA: Diagnosis not present

## 2017-02-18 DIAGNOSIS — D472 Monoclonal gammopathy: Secondary | ICD-10-CM | POA: Diagnosis not present

## 2017-02-18 DIAGNOSIS — Z992 Dependence on renal dialysis: Secondary | ICD-10-CM | POA: Diagnosis not present

## 2017-02-18 NOTE — Progress Notes (Signed)
Cc: Established Dialysis Access patient, swelling in left upper extremity  History of Present Illness  Brian Liu is a 66 y.o. (10-13-1950) male who was last seen in our practice on 05-17-14 by Dr. Kellie Simmering. 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 Patient had the fistula for 23 years at that visit without problems. 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 He has never had bleeding or infection from the fistula. This is the only access he has ever had. At the 05-17-14 visit upper extremity with functioning radial/cephalic AV fistula. 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 Excellent pulse and palpable thrill. Left and well perfused. Duplex scan of the fistula and to check for steal syndrome performed at 05-17-14 visit. The maximum diameter of the aneurysms was a 2.5 cm. There was excellent flow throughout. There was some retrograde flow in the radial artery with the fistula open which becomes antegrade with compression of the fistula. Dr. Kellie Simmering did not think patient was having significant steal syndrome particularly  after 23 years of functioning AV fistula. No need for treatment of these aneurysms at that time which were quite stable and had had no history of bleeding or infection.  He dialyzes M-W-F via left forearm AVF.  He plans on going home after he regains his strength from newly diagnosed multiple myeloma and receiving chemo.    Pt returns today, referred by [name], PA, with [facility], with c/o left arm and hand swelling x 1 week, son states swelling has been resolving. Pt states he might have laid on his left arm. States AVF still working very well for HD.  Pt is a [facility] of [facility].    Past Medical History:  Diagnosis Date   . Atrial fibrillation (HCC)    Permanent, Not on Coumadin because of history of lower GI bleed  . Diastolic dysfunction    Restrictive  diastolic filling pattern echo,  February, 2013  . Dyslipidemia   . Ejection fraction    EF 60-65%, echo, 2013  . ESRD (end stage renal disease) ([place])    Dialysis, fistula in left arm  . Hypertension   . Lower gastrointestinal bleed   . LVH (left ventricular hypertrophy)   . 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 Mitral regurgitation    Mild by echo, 2013  . Multiple myeloma ([place]) 04/30/2016  . Pancytopenia ([place])    Dr Ignacia Bayley  . Pulmonary hypertension (HCC)    Severe, PA pressure 65-70 mm of mercury, echo, 2013  . Tobacco abuse     Social History Social History  Substance Use Topics  . Smoking status: Never Smoker  . Smokeless tobacco: Never Used  . Alcohol use No    Family History Family History  Problem Relation Age of Onset  . Other Mother        Deceased, 66  . Stomach cancer Father        Deceased, 57  . Kidney failure Son   . Healthy Son     Surgical History Past Surgical History:  Procedure Laterality Date  . AV FISTULA PLACEMENT Left 1992   Left arm   . 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 Other     Hydrocodone cough syrup makes him itch    Current Outpatient Prescriptions  Medication Sig Dispense Refill  . folic acid (FOLVITE) 1 MG tablet Take 1 mg by mouth daily.    [name] gabapentin (NEURONTIN) 300 MG capsule Take 300 mg by mouth 3 (three) times daily.    [name] lanthanum (FOSRENOL) 500 MG chewable tablet Chew 1,000 mg by mouth 3 (three) times daily with meals. And 1 with snacks    . latanoprost (XALATAN) 0.005 % ophthalmic solution Place 1 drop into both eyes at bedtime.  6  . levothyroxine (SYNTHROID, LEVOTHROID) 137 MCG tablet Take 137 mcg by mouth daily before breakfast.    . Multiple Vitamin (DAILY VITE)  TABS Take 1 tablet by mouth daily.     . simvastatin (ZOCOR) 20 MG tablet Take 20 mg by mouth daily with supper.       No current facility-administered medications for this visit.      REVIEW OF SYSTEMS: see HPI for pertinent positives and negatives    PHYSICAL EXAMINATION:  Vitals:   02/18/17 1423  BP: 113/75  Pulse: 88  Resp: 16  Temp: (!) 97 F (36.1 C)  TempSrc: Oral  SpO2: 100%  Weight: 145 lb (65.8 kg)  Height: _0  (1.854 m)   Body mass index is 19.13 kg/m.  General: The patient appears their stated age.   HEENT:  No gross abnormalities Pulmonary: Respirations are non-labored, + moist cough, adequate air movement,  Abdomen: Soft and non-tender with normal pitched bowel sounds. Musculoskeletal: There are no major deformities.   Neurologic: No focal weakness or paresthesias are detected, Skin: There are no ulcer or rashes noted. Psychiatric: The patient has normal affect. Cardiovascular: There is a regular rate and rhythm without significant murmur appreciated. Palpable bruit left forearm AVF 1+ bilateral radial pulses. Dependent edema at left elbow and lateral aspect left upper arm.   Outside Studies/Documentation 10 pages of outside documents were reviewed including: [facility], referral form.  Medical Decision Making  [name] is a 66 y.o. male who presents with ESRD requiring hemodialysis, apparent dependent edema at his left elbow and lateral left upper arm, pt states he might have slept and laid on his left arm.  Resolving swelling of left upper extremity, AV fistula working well for HD, no steal sx's.   Follow up as needed  NICKEL, Sharmon Leyden, RN, MSN, FNP-C [facility] [place] Office: [phone]  02/18/2017, 2:26 PM  Clinic MD: Early

## 2017-02-19 DIAGNOSIS — N186 End stage renal disease: Secondary | ICD-10-CM | POA: Diagnosis not present

## 2017-02-19 DIAGNOSIS — N2581 Secondary hyperparathyroidism of renal origin: Secondary | ICD-10-CM | POA: Diagnosis not present

## 2017-02-19 DIAGNOSIS — D472 Monoclonal gammopathy: Secondary | ICD-10-CM | POA: Diagnosis not present

## 2017-02-19 DIAGNOSIS — D631 Anemia in chronic kidney disease: Secondary | ICD-10-CM | POA: Diagnosis not present

## 2017-02-19 DIAGNOSIS — Z992 Dependence on renal dialysis: Secondary | ICD-10-CM | POA: Diagnosis not present

## 2017-02-21 DIAGNOSIS — D631 Anemia in chronic kidney disease: Secondary | ICD-10-CM | POA: Diagnosis not present

## 2017-02-21 DIAGNOSIS — N2581 Secondary hyperparathyroidism of renal origin: Secondary | ICD-10-CM | POA: Diagnosis not present

## 2017-02-21 DIAGNOSIS — D472 Monoclonal gammopathy: Secondary | ICD-10-CM | POA: Diagnosis not present

## 2017-02-21 DIAGNOSIS — Z992 Dependence on renal dialysis: Secondary | ICD-10-CM | POA: Diagnosis not present

## 2017-02-21 DIAGNOSIS — N186 End stage renal disease: Secondary | ICD-10-CM | POA: Diagnosis not present

## 2017-02-24 DIAGNOSIS — Z992 Dependence on renal dialysis: Secondary | ICD-10-CM | POA: Diagnosis not present

## 2017-02-24 DIAGNOSIS — N2581 Secondary hyperparathyroidism of renal origin: Secondary | ICD-10-CM | POA: Diagnosis not present

## 2017-02-24 DIAGNOSIS — D631 Anemia in chronic kidney disease: Secondary | ICD-10-CM | POA: Diagnosis not present

## 2017-02-24 DIAGNOSIS — D472 Monoclonal gammopathy: Secondary | ICD-10-CM | POA: Diagnosis not present

## 2017-02-24 DIAGNOSIS — N186 End stage renal disease: Secondary | ICD-10-CM | POA: Diagnosis not present

## 2017-02-26 DIAGNOSIS — D631 Anemia in chronic kidney disease: Secondary | ICD-10-CM | POA: Diagnosis not present

## 2017-02-26 DIAGNOSIS — Z992 Dependence on renal dialysis: Secondary | ICD-10-CM | POA: Diagnosis not present

## 2017-02-26 DIAGNOSIS — N186 End stage renal disease: Secondary | ICD-10-CM | POA: Diagnosis not present

## 2017-02-26 DIAGNOSIS — N2581 Secondary hyperparathyroidism of renal origin: Secondary | ICD-10-CM | POA: Diagnosis not present

## 2017-02-26 DIAGNOSIS — D472 Monoclonal gammopathy: Secondary | ICD-10-CM | POA: Diagnosis not present

## 2017-02-28 DIAGNOSIS — N186 End stage renal disease: Secondary | ICD-10-CM | POA: Diagnosis not present

## 2017-02-28 DIAGNOSIS — N2581 Secondary hyperparathyroidism of renal origin: Secondary | ICD-10-CM | POA: Diagnosis not present

## 2017-02-28 DIAGNOSIS — Z992 Dependence on renal dialysis: Secondary | ICD-10-CM | POA: Diagnosis not present

## 2017-02-28 DIAGNOSIS — D631 Anemia in chronic kidney disease: Secondary | ICD-10-CM | POA: Diagnosis not present

## 2017-02-28 DIAGNOSIS — D472 Monoclonal gammopathy: Secondary | ICD-10-CM | POA: Diagnosis not present

## 2017-03-03 DIAGNOSIS — N186 End stage renal disease: Secondary | ICD-10-CM | POA: Diagnosis not present

## 2017-03-03 DIAGNOSIS — Z992 Dependence on renal dialysis: Secondary | ICD-10-CM | POA: Diagnosis not present

## 2017-03-03 DIAGNOSIS — N2581 Secondary hyperparathyroidism of renal origin: Secondary | ICD-10-CM | POA: Diagnosis not present

## 2017-03-03 DIAGNOSIS — D472 Monoclonal gammopathy: Secondary | ICD-10-CM | POA: Diagnosis not present

## 2017-03-03 DIAGNOSIS — D631 Anemia in chronic kidney disease: Secondary | ICD-10-CM | POA: Diagnosis not present

## 2017-03-04 DIAGNOSIS — N186 End stage renal disease: Secondary | ICD-10-CM | POA: Diagnosis not present

## 2017-03-04 DIAGNOSIS — Z992 Dependence on renal dialysis: Secondary | ICD-10-CM | POA: Diagnosis not present

## 2017-03-05 DIAGNOSIS — D472 Monoclonal gammopathy: Secondary | ICD-10-CM | POA: Diagnosis not present

## 2017-03-05 DIAGNOSIS — Z992 Dependence on renal dialysis: Secondary | ICD-10-CM | POA: Diagnosis not present

## 2017-03-05 DIAGNOSIS — N2581 Secondary hyperparathyroidism of renal origin: Secondary | ICD-10-CM | POA: Diagnosis not present

## 2017-03-05 DIAGNOSIS — D631 Anemia in chronic kidney disease: Secondary | ICD-10-CM | POA: Diagnosis not present

## 2017-03-05 DIAGNOSIS — N186 End stage renal disease: Secondary | ICD-10-CM | POA: Diagnosis not present

## 2017-03-07 DIAGNOSIS — Z992 Dependence on renal dialysis: Secondary | ICD-10-CM | POA: Diagnosis not present

## 2017-03-07 DIAGNOSIS — D631 Anemia in chronic kidney disease: Secondary | ICD-10-CM | POA: Diagnosis not present

## 2017-03-07 DIAGNOSIS — N186 End stage renal disease: Secondary | ICD-10-CM | POA: Diagnosis not present

## 2017-03-07 DIAGNOSIS — D472 Monoclonal gammopathy: Secondary | ICD-10-CM | POA: Diagnosis not present

## 2017-03-07 DIAGNOSIS — N2581 Secondary hyperparathyroidism of renal origin: Secondary | ICD-10-CM | POA: Diagnosis not present

## 2017-03-10 DIAGNOSIS — N2581 Secondary hyperparathyroidism of renal origin: Secondary | ICD-10-CM | POA: Diagnosis not present

## 2017-03-10 DIAGNOSIS — D472 Monoclonal gammopathy: Secondary | ICD-10-CM | POA: Diagnosis not present

## 2017-03-10 DIAGNOSIS — Z992 Dependence on renal dialysis: Secondary | ICD-10-CM | POA: Diagnosis not present

## 2017-03-10 DIAGNOSIS — D631 Anemia in chronic kidney disease: Secondary | ICD-10-CM | POA: Diagnosis not present

## 2017-03-10 DIAGNOSIS — N186 End stage renal disease: Secondary | ICD-10-CM | POA: Diagnosis not present

## 2017-03-11 DIAGNOSIS — H401133 Primary open-angle glaucoma, bilateral, severe stage: Secondary | ICD-10-CM | POA: Diagnosis not present

## 2017-03-11 DIAGNOSIS — H469 Unspecified optic neuritis: Secondary | ICD-10-CM | POA: Diagnosis not present

## 2017-03-12 DIAGNOSIS — Z992 Dependence on renal dialysis: Secondary | ICD-10-CM | POA: Diagnosis not present

## 2017-03-12 DIAGNOSIS — N186 End stage renal disease: Secondary | ICD-10-CM | POA: Diagnosis not present

## 2017-03-12 DIAGNOSIS — D472 Monoclonal gammopathy: Secondary | ICD-10-CM | POA: Diagnosis not present

## 2017-03-12 DIAGNOSIS — N2581 Secondary hyperparathyroidism of renal origin: Secondary | ICD-10-CM | POA: Diagnosis not present

## 2017-03-12 DIAGNOSIS — D631 Anemia in chronic kidney disease: Secondary | ICD-10-CM | POA: Diagnosis not present

## 2017-03-14 DIAGNOSIS — D472 Monoclonal gammopathy: Secondary | ICD-10-CM | POA: Diagnosis not present

## 2017-03-14 DIAGNOSIS — N186 End stage renal disease: Secondary | ICD-10-CM | POA: Diagnosis not present

## 2017-03-14 DIAGNOSIS — N2581 Secondary hyperparathyroidism of renal origin: Secondary | ICD-10-CM | POA: Diagnosis not present

## 2017-03-14 DIAGNOSIS — Z992 Dependence on renal dialysis: Secondary | ICD-10-CM | POA: Diagnosis not present

## 2017-03-14 DIAGNOSIS — D631 Anemia in chronic kidney disease: Secondary | ICD-10-CM | POA: Diagnosis not present

## 2017-03-17 DIAGNOSIS — Z992 Dependence on renal dialysis: Secondary | ICD-10-CM | POA: Diagnosis not present

## 2017-03-17 DIAGNOSIS — D631 Anemia in chronic kidney disease: Secondary | ICD-10-CM | POA: Diagnosis not present

## 2017-03-17 DIAGNOSIS — D472 Monoclonal gammopathy: Secondary | ICD-10-CM | POA: Diagnosis not present

## 2017-03-17 DIAGNOSIS — N2581 Secondary hyperparathyroidism of renal origin: Secondary | ICD-10-CM | POA: Diagnosis not present

## 2017-03-17 DIAGNOSIS — N186 End stage renal disease: Secondary | ICD-10-CM | POA: Diagnosis not present

## 2017-03-18 DIAGNOSIS — N186 End stage renal disease: Secondary | ICD-10-CM | POA: Diagnosis not present

## 2017-03-18 DIAGNOSIS — I951 Orthostatic hypotension: Secondary | ICD-10-CM | POA: Diagnosis not present

## 2017-03-18 DIAGNOSIS — Z992 Dependence on renal dialysis: Secondary | ICD-10-CM | POA: Diagnosis not present

## 2017-03-19 DIAGNOSIS — Z992 Dependence on renal dialysis: Secondary | ICD-10-CM | POA: Diagnosis not present

## 2017-03-19 DIAGNOSIS — D631 Anemia in chronic kidney disease: Secondary | ICD-10-CM | POA: Diagnosis not present

## 2017-03-19 DIAGNOSIS — N186 End stage renal disease: Secondary | ICD-10-CM | POA: Diagnosis not present

## 2017-03-19 DIAGNOSIS — N2581 Secondary hyperparathyroidism of renal origin: Secondary | ICD-10-CM | POA: Diagnosis not present

## 2017-03-19 DIAGNOSIS — D472 Monoclonal gammopathy: Secondary | ICD-10-CM | POA: Diagnosis not present

## 2017-03-21 DIAGNOSIS — N186 End stage renal disease: Secondary | ICD-10-CM | POA: Diagnosis not present

## 2017-03-21 DIAGNOSIS — N2581 Secondary hyperparathyroidism of renal origin: Secondary | ICD-10-CM | POA: Diagnosis not present

## 2017-03-21 DIAGNOSIS — Z992 Dependence on renal dialysis: Secondary | ICD-10-CM | POA: Diagnosis not present

## 2017-03-21 DIAGNOSIS — D472 Monoclonal gammopathy: Secondary | ICD-10-CM | POA: Diagnosis not present

## 2017-03-21 DIAGNOSIS — D631 Anemia in chronic kidney disease: Secondary | ICD-10-CM | POA: Diagnosis not present

## 2017-03-24 DIAGNOSIS — Z992 Dependence on renal dialysis: Secondary | ICD-10-CM | POA: Diagnosis not present

## 2017-03-24 DIAGNOSIS — N2581 Secondary hyperparathyroidism of renal origin: Secondary | ICD-10-CM | POA: Diagnosis not present

## 2017-03-24 DIAGNOSIS — D472 Monoclonal gammopathy: Secondary | ICD-10-CM | POA: Diagnosis not present

## 2017-03-24 DIAGNOSIS — N186 End stage renal disease: Secondary | ICD-10-CM | POA: Diagnosis not present

## 2017-03-24 DIAGNOSIS — D631 Anemia in chronic kidney disease: Secondary | ICD-10-CM | POA: Diagnosis not present

## 2017-03-26 DIAGNOSIS — N186 End stage renal disease: Secondary | ICD-10-CM | POA: Diagnosis not present

## 2017-03-26 DIAGNOSIS — D631 Anemia in chronic kidney disease: Secondary | ICD-10-CM | POA: Diagnosis not present

## 2017-03-26 DIAGNOSIS — Z992 Dependence on renal dialysis: Secondary | ICD-10-CM | POA: Diagnosis not present

## 2017-03-26 DIAGNOSIS — D472 Monoclonal gammopathy: Secondary | ICD-10-CM | POA: Diagnosis not present

## 2017-03-26 DIAGNOSIS — N2581 Secondary hyperparathyroidism of renal origin: Secondary | ICD-10-CM | POA: Diagnosis not present

## 2017-03-27 DIAGNOSIS — D630 Anemia in neoplastic disease: Secondary | ICD-10-CM | POA: Diagnosis not present

## 2017-03-27 DIAGNOSIS — R197 Diarrhea, unspecified: Secondary | ICD-10-CM | POA: Diagnosis not present

## 2017-03-27 DIAGNOSIS — I509 Heart failure, unspecified: Secondary | ICD-10-CM | POA: Diagnosis not present

## 2017-03-27 DIAGNOSIS — E039 Hypothyroidism, unspecified: Secondary | ICD-10-CM | POA: Diagnosis not present

## 2017-03-27 DIAGNOSIS — I951 Orthostatic hypotension: Secondary | ICD-10-CM | POA: Diagnosis not present

## 2017-03-27 DIAGNOSIS — Z992 Dependence on renal dialysis: Secondary | ICD-10-CM | POA: Diagnosis not present

## 2017-03-27 DIAGNOSIS — Z8521 Personal history of malignant neoplasm of larynx: Secondary | ICD-10-CM | POA: Diagnosis not present

## 2017-03-27 DIAGNOSIS — Z940 Kidney transplant status: Secondary | ICD-10-CM | POA: Diagnosis not present

## 2017-03-27 DIAGNOSIS — N186 End stage renal disease: Secondary | ICD-10-CM | POA: Diagnosis not present

## 2017-03-27 DIAGNOSIS — M6281 Muscle weakness (generalized): Secondary | ICD-10-CM | POA: Diagnosis not present

## 2017-03-27 DIAGNOSIS — I272 Pulmonary hypertension, unspecified: Secondary | ICD-10-CM | POA: Diagnosis not present

## 2017-03-27 DIAGNOSIS — E785 Hyperlipidemia, unspecified: Secondary | ICD-10-CM | POA: Diagnosis not present

## 2017-03-27 DIAGNOSIS — C9000 Multiple myeloma not having achieved remission: Secondary | ICD-10-CM | POA: Diagnosis not present

## 2017-03-27 DIAGNOSIS — I132 Hypertensive heart and chronic kidney disease with heart failure and with stage 5 chronic kidney disease, or end stage renal disease: Secondary | ICD-10-CM | POA: Diagnosis not present

## 2017-03-27 DIAGNOSIS — I4891 Unspecified atrial fibrillation: Secondary | ICD-10-CM | POA: Diagnosis not present

## 2017-03-28 ENCOUNTER — Other Ambulatory Visit: Payer: Self-pay | Admitting: *Deleted

## 2017-03-28 DIAGNOSIS — N2581 Secondary hyperparathyroidism of renal origin: Secondary | ICD-10-CM | POA: Diagnosis not present

## 2017-03-28 DIAGNOSIS — Z992 Dependence on renal dialysis: Secondary | ICD-10-CM | POA: Diagnosis not present

## 2017-03-28 DIAGNOSIS — D631 Anemia in chronic kidney disease: Secondary | ICD-10-CM | POA: Diagnosis not present

## 2017-03-28 DIAGNOSIS — N186 End stage renal disease: Secondary | ICD-10-CM | POA: Diagnosis not present

## 2017-03-28 DIAGNOSIS — D472 Monoclonal gammopathy: Secondary | ICD-10-CM | POA: Diagnosis not present

## 2017-03-31 DIAGNOSIS — N186 End stage renal disease: Secondary | ICD-10-CM | POA: Diagnosis not present

## 2017-03-31 DIAGNOSIS — D472 Monoclonal gammopathy: Secondary | ICD-10-CM | POA: Diagnosis not present

## 2017-03-31 DIAGNOSIS — Z992 Dependence on renal dialysis: Secondary | ICD-10-CM | POA: Diagnosis not present

## 2017-03-31 DIAGNOSIS — N2581 Secondary hyperparathyroidism of renal origin: Secondary | ICD-10-CM | POA: Diagnosis not present

## 2017-03-31 DIAGNOSIS — D631 Anemia in chronic kidney disease: Secondary | ICD-10-CM | POA: Diagnosis not present

## 2017-04-01 DIAGNOSIS — I951 Orthostatic hypotension: Secondary | ICD-10-CM | POA: Diagnosis not present

## 2017-04-01 DIAGNOSIS — Z299 Encounter for prophylactic measures, unspecified: Secondary | ICD-10-CM | POA: Diagnosis not present

## 2017-04-01 DIAGNOSIS — I4891 Unspecified atrial fibrillation: Secondary | ICD-10-CM | POA: Diagnosis not present

## 2017-04-01 DIAGNOSIS — M6281 Muscle weakness (generalized): Secondary | ICD-10-CM | POA: Diagnosis not present

## 2017-04-01 DIAGNOSIS — R630 Anorexia: Secondary | ICD-10-CM | POA: Diagnosis not present

## 2017-04-01 DIAGNOSIS — I509 Heart failure, unspecified: Secondary | ICD-10-CM | POA: Diagnosis not present

## 2017-04-01 DIAGNOSIS — I429 Cardiomyopathy, unspecified: Secondary | ICD-10-CM | POA: Diagnosis not present

## 2017-04-01 DIAGNOSIS — I132 Hypertensive heart and chronic kidney disease with heart failure and with stage 5 chronic kidney disease, or end stage renal disease: Secondary | ICD-10-CM | POA: Diagnosis not present

## 2017-04-01 DIAGNOSIS — J449 Chronic obstructive pulmonary disease, unspecified: Secondary | ICD-10-CM | POA: Diagnosis not present

## 2017-04-01 DIAGNOSIS — N186 End stage renal disease: Secondary | ICD-10-CM | POA: Diagnosis not present

## 2017-04-01 DIAGNOSIS — C9000 Multiple myeloma not having achieved remission: Secondary | ICD-10-CM | POA: Diagnosis not present

## 2017-04-01 DIAGNOSIS — I10 Essential (primary) hypertension: Secondary | ICD-10-CM | POA: Diagnosis not present

## 2017-04-01 DIAGNOSIS — Z992 Dependence on renal dialysis: Secondary | ICD-10-CM | POA: Diagnosis not present

## 2017-04-02 DIAGNOSIS — D631 Anemia in chronic kidney disease: Secondary | ICD-10-CM | POA: Diagnosis not present

## 2017-04-02 DIAGNOSIS — N2581 Secondary hyperparathyroidism of renal origin: Secondary | ICD-10-CM | POA: Diagnosis not present

## 2017-04-02 DIAGNOSIS — N186 End stage renal disease: Secondary | ICD-10-CM | POA: Diagnosis not present

## 2017-04-02 DIAGNOSIS — D472 Monoclonal gammopathy: Secondary | ICD-10-CM | POA: Diagnosis not present

## 2017-04-02 DIAGNOSIS — Z992 Dependence on renal dialysis: Secondary | ICD-10-CM | POA: Diagnosis not present

## 2017-04-03 ENCOUNTER — Ambulatory Visit (HOSPITAL_COMMUNITY)
Admission: RE | Admit: 2017-04-03 | Discharge: 2017-04-03 | Disposition: A | Discharge status: Home / Self Care | Payer: Medicare Other | Source: Ambulatory Visit | Attending: Vascular Surgery | Admitting: Vascular Surgery

## 2017-04-03 ENCOUNTER — Encounter (HOSPITAL_COMMUNITY)
Admission: RE | Disposition: A | Discharge status: Home / Self Care | Payer: Self-pay | Source: Ambulatory Visit | Attending: Vascular Surgery

## 2017-04-03 ENCOUNTER — Encounter (HOSPITAL_COMMUNITY): Payer: Self-pay | Admitting: Vascular Surgery

## 2017-04-03 DIAGNOSIS — I340 Nonrheumatic mitral (valve) insufficiency: Secondary | ICD-10-CM | POA: Diagnosis not present

## 2017-04-03 DIAGNOSIS — I132 Hypertensive heart and chronic kidney disease with heart failure and with stage 5 chronic kidney disease, or end stage renal disease: Secondary | ICD-10-CM | POA: Diagnosis not present

## 2017-04-03 DIAGNOSIS — Z992 Dependence on renal dialysis: Secondary | ICD-10-CM | POA: Diagnosis not present

## 2017-04-03 DIAGNOSIS — N186 End stage renal disease: Secondary | ICD-10-CM | POA: Diagnosis not present

## 2017-04-03 DIAGNOSIS — E785 Hyperlipidemia, unspecified: Secondary | ICD-10-CM | POA: Diagnosis not present

## 2017-04-03 DIAGNOSIS — Z885 Allergy status to narcotic agent status: Secondary | ICD-10-CM | POA: Diagnosis not present

## 2017-04-03 DIAGNOSIS — I120 Hypertensive chronic kidney disease with stage 5 chronic kidney disease or end stage renal disease: Secondary | ICD-10-CM | POA: Diagnosis not present

## 2017-04-03 DIAGNOSIS — T82858A Stenosis of vascular prosthetic devices, implants and grafts, initial encounter: Secondary | ICD-10-CM | POA: Diagnosis not present

## 2017-04-03 DIAGNOSIS — Y832 Surgical operation with anastomosis, bypass or graft as the cause of abnormal reaction of the patient, or of later complication, without mention of misadventure at the time of the procedure: Secondary | ICD-10-CM | POA: Insufficient documentation

## 2017-04-03 DIAGNOSIS — I951 Orthostatic hypotension: Secondary | ICD-10-CM | POA: Diagnosis not present

## 2017-04-03 DIAGNOSIS — I482 Chronic atrial fibrillation: Secondary | ICD-10-CM | POA: Insufficient documentation

## 2017-04-03 DIAGNOSIS — T8611 Kidney transplant rejection: Secondary | ICD-10-CM | POA: Insufficient documentation

## 2017-04-03 DIAGNOSIS — M6281 Muscle weakness (generalized): Secondary | ICD-10-CM | POA: Diagnosis not present

## 2017-04-03 DIAGNOSIS — I871 Compression of vein: Secondary | ICD-10-CM

## 2017-04-03 DIAGNOSIS — C9000 Multiple myeloma not having achieved remission: Secondary | ICD-10-CM | POA: Diagnosis not present

## 2017-04-03 DIAGNOSIS — I509 Heart failure, unspecified: Secondary | ICD-10-CM | POA: Diagnosis not present

## 2017-04-03 HISTORY — PX: A/V FISTULAGRAM: CATH118298

## 2017-04-03 HISTORY — PX: PERIPHERAL VASCULAR BALLOON ANGIOPLASTY: CATH118281

## 2017-04-03 LAB — POCT I-STAT, CHEM 8
BUN: 25 mg/dL — ABNORMAL HIGH (ref 6–20)
CALCIUM ION: 0.92 mmol/L — AB (ref 1.15–1.40)
Chloride: 95 mmol/L — ABNORMAL LOW (ref 101–111)
Creatinine, Ser: 5.1 mg/dL — ABNORMAL HIGH (ref 0.61–1.24)
Glucose, Bld: 91 mg/dL (ref 65–99)
HCT: 33 % — ABNORMAL LOW (ref 39.0–52.0)
Hemoglobin: 11.2 g/dL — ABNORMAL LOW (ref 13.0–17.0)
Potassium: 3.6 mmol/L (ref 3.5–5.1)
SODIUM: 138 mmol/L (ref 135–145)
TCO2: 28 mmol/L (ref 22–32)

## 2017-04-03 SURGERY — A/V FISTULAGRAM
Anesthesia: LOCAL | Laterality: Left

## 2017-04-03 MED ORDER — HYDRALAZINE HCL 20 MG/ML IJ SOLN
INTRAMUSCULAR | Status: AC
  Filled 2017-04-03: qty 1

## 2017-04-03 MED ORDER — SODIUM CHLORIDE 0.9% FLUSH: 3.0000 mL | Freq: Two times a day (BID) | INTRAVENOUS | Status: DC

## 2017-04-03 MED ORDER — SODIUM CHLORIDE 0.9% FLUSH: 3.0000 mL | INTRAVENOUS | Status: DC | PRN

## 2017-04-03 MED ORDER — HEPARIN (PORCINE) IN NACL 2-0.9 UNIT/ML-% IJ SOLN
INTRAMUSCULAR | Status: AC | PRN
  Administered 2017-04-03: 500 mL

## 2017-04-03 MED ORDER — HYDRALAZINE HCL 20 MG/ML IJ SOLN
INTRAMUSCULAR | Status: DC | PRN
  Administered 2017-04-03 (×2): 10 mg via INTRAVENOUS

## 2017-04-03 MED ORDER — SODIUM CHLORIDE 0.9 % IV SOLN: 250.0000 mL | INTRAVENOUS | Status: DC | PRN

## 2017-04-03 MED ORDER — IODIXANOL 320 MG/ML IV SOLN
INTRAVENOUS | Status: DC | PRN
  Administered 2017-04-03: 85 mL via INTRAVENOUS

## 2017-04-03 MED ORDER — LIDOCAINE HCL (PF) 1 % IJ SOLN
INTRAMUSCULAR | Status: DC | PRN
  Administered 2017-04-03: 2 mL

## 2017-04-03 MED ORDER — LIDOCAINE HCL (PF) 1 % IJ SOLN
INTRAMUSCULAR | Status: AC
  Filled 2017-04-03: qty 30

## 2017-04-03 MED ORDER — HEPARIN (PORCINE) IN NACL 2-0.9 UNIT/ML-% IJ SOLN
INTRAMUSCULAR | Status: AC
  Filled 2017-04-03: qty 500

## 2017-04-03 MED ORDER — LABETALOL HCL 5 MG/ML IV SOLN: 10.0000 mg | INTRAVENOUS | Status: DC | PRN

## 2017-04-03 MED ORDER — HYDRALAZINE HCL 20 MG/ML IJ SOLN: 5.0000 mg | INTRAMUSCULAR | Status: DC | PRN

## 2017-04-03 MED ORDER — HEPARIN SODIUM (PORCINE) 1000 UNIT/ML IJ SOLN
INTRAMUSCULAR | Status: DC | PRN
  Administered 2017-04-03: 3000 [IU] via INTRAVENOUS

## 2017-04-03 MED ORDER — HEPARIN SODIUM (PORCINE) 1000 UNIT/ML IJ SOLN
INTRAMUSCULAR | Status: AC
  Filled 2017-04-03: qty 1

## 2017-04-03 SURGICAL SUPPLY — 21 items
BAG SNAP BAND KOVER 36X36 (MISCELLANEOUS) ×2 IMPLANT
BALLN ARMADA 12X40X80 (BALLOONS) ×2
BALLN MUSTANG 10X40X135 (BALLOONS) ×2
BALLN MUSTANG 6X60X75 (BALLOONS) ×2
BALLN MUSTANG 8X60X75 (BALLOONS) ×2
BALLOON ARMADA 12X40X80 (BALLOONS) ×1 IMPLANT
BALLOON MUSTANG 10X40X135 (BALLOONS) ×1 IMPLANT
BALLOON MUSTANG 6X60X75 (BALLOONS) ×1 IMPLANT
BALLOON MUSTANG 8X60X75 (BALLOONS) ×1 IMPLANT
COVER DOME SNAP 22 D (MISCELLANEOUS) ×2 IMPLANT
COVER PRB 48X5XTLSCP FOLD TPE (BAG) ×1 IMPLANT
COVER PROBE 5X48 (BAG) ×1
KIT ENCORE 26 ADVANTAGE (KITS) ×2 IMPLANT
KIT MICROINTRODUCER STIFF 5F (SHEATH) ×2 IMPLANT
PROTECTION STATION PRESSURIZED (MISCELLANEOUS) ×2
SHEATH PINNACLE R/O II 6F 4CM (SHEATH) ×2 IMPLANT
STATION PROTECTION PRESSURIZED (MISCELLANEOUS) ×1 IMPLANT
STOPCOCK MORSE 400PSI 3WAY (MISCELLANEOUS) ×2 IMPLANT
TRAY PV CATH (CUSTOM PROCEDURE TRAY) ×2 IMPLANT
TUBING CIL FLEX 10 FLL-RA (TUBING) ×2 IMPLANT
WIRE HI TORQ VERSACORE J 260CM (WIRE) ×2 IMPLANT

## 2017-04-03 NOTE — Discharge Instructions (Signed)

## 2017-04-03 NOTE — H&P (Signed)
Brief History and Physical  History of Present Illness   Brian Liu is a 66 y.o. male who presents with chief complaint: left arm swelling.  The patient presents today for left arm fistulogram, possible intervention.  Patient denies any steal sx.  He denies any SVC syndrome sx.    Past Medical History:  Diagnosis Date  . Atrial fibrillation (HCC)    Permanent, Not on Coumadin because of history of lower GI bleed  . Diastolic dysfunction    Restrictive  diastolic filling pattern echo,  February, 2013  . Dyslipidemia   . Ejection fraction    EF 60-65%, echo, 2013  . ESRD (end stage renal disease) ([place])    Dialysis, fistula in left arm  . Hypertension   . Lower gastrointestinal bleed   . LVH (left ventricular hypertrophy)   . 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 Mitral regurgitation    Mild by echo, 2013  . Multiple myeloma ([place]) 04/30/2016  . Pancytopenia ([place])    Dr Ignacia Bayley  . Pulmonary hypertension (HCC)    Severe, PA pressure 65-70 mm of mercury, echo, 2013  . Tobacco abuse     Past Surgical History:  Procedure Laterality Date  . AV FISTULA PLACEMENT Left 1992   Left arm   . 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 Marital status: Widowed    Spouse name: N/A  . Number of children: N/A  . Years of education: N/A   Occupational History  . Not on file.   Social History Main Topics  . Smoking status: Never Smoker  . Smokeless tobacco: Never Used  . Alcohol use No  . Drug use: No  . Sexual activity: Not on file   Other Topics Concern  . Not on file   Social History Narrative   Lives alone in a one story home.  Has 2 sons.     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     Education high school.     Family History  Problem Relation Age of Onset  . Other Mother        Deceased, 13  . Stomach cancer  Father        Deceased, 64  . Kidney failure Son   . Healthy Son     Current Facility-Administered Medications  Medication Dose Route Frequency Provider Last Rate Last Dose  . 0.9 %  sodium chloride infusion  250 mL Intravenous PRN Conrad [place], MD      . sodium chloride flush (NS) 0.9 % injection 3 mL  3 mL Intravenous Q12H Adele Barthel L, MD      . sodium chloride flush (NS) 0.9 % injection 3 mL  3 mL Intravenous PRN Conrad Faxon, MD        Allergies  Allergen Reactions  . Other     Hydrocodone cough syrup makes him itch    Review of Systems: As listed above, otherwise negative.   Physical Examination   Vitals:   04/03/17 0654  BP: (!) 137/95  Pulse: 94  Resp: 18  Temp: 97.7 F (36.5 C)  TempSrc: Oral  SpO2: 100%  Weight: 140 lb (63.5 kg)  Height: '6\' 2"'  (1.88 m)   Body mass index is 17.97 kg/m.  General Alert, O x 3, WD, NAD  Pulmonary Sym  exp, good B air movt, CTA B  Cardiac RRR, Nl S1, S2, no Murmurs, No rubs, No S3,S4  Musculo- skeletal Faint thrill, +bruit in FA AVF, left arm swollen throughout, somewhat pulsatile cephalic vein,   Neurologic Pain and light touch intact in extremities,     Laboratory  See [place] is a 66 y.o. 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   The patient is scheduled for: L arm fistulogram, possible intervention.  I discussed with the patient the nature of angiographic procedures, especially the limited patencies of any endovascular intervention.  The patient is aware of that the risks of an angiographic procedure include but are not limited to: bleeding, infection, access site complications, renal failure, embolization, rupture of vessel, dissection, possible need for emergent surgical intervention, possible need for surgical procedures to treat the patient's pathology, and stroke and death.    The patient is aware of the risks and agrees to proceed.   Adele Barthel,  MD, FACS [facility] [place] Office: [phone] Pager: [phone]  04/03/2017, 8:59 AM

## 2017-04-03 NOTE — Op Note (Signed)
OPERATIVE NOTE   PROCEDURE: 1.  left radiocephalic arteriovenous fistula cannulation under ultrasound guidance 2.  left arm shuntogram 3.  Venoplasty of forearm cephalic vein (6 mm x 60 mm) 4.  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 male who presents with left arm swelling concerning for proximal venous stenosis.  The patient is scheduled for left arm shuntogram and possible intervention.  The patient is aware of that the risks of an angiographic procedure include but are not limited to: bleeding, infection, access site complications, thrombosis of access, renal failure, embolization, rupture of vessel, dissection, arteriovenous fistula, possible need for emergent surgical intervention, possible need for surgical procedures to treat the patient's pathology, anaphylactic reaction to contrast, and stroke and death.  The patient is aware of the risks of the procedure and elects to proceed forward.   DESCRIPTION: After full informed written consent was obtained, the patient was brought back to the angiography suite and placed supine upon the angiography table.  The patient was connected to monitoring equipment.  The left forearm was prepped and draped in the standard fashion for a percutaneous  access intervention.  Under ultrasound guidance, the left radiocephalic arteriovenous fistula was cannulated with a micropuncture needle.  The microwire was advanced into the fistula and the needle was exchanged for the a microsheath, which was lodged 2 cm into the access.  The wire was removed and the sheath was connected to the IV extension tubing.  Hand injections were completed to image the access from the cannulation site up the right atrium.  Based on the images, this patient will need: venoplasty of left innominate vein and forearm cephalic vein.  The patient was given 3000 units of Heparin intravenously to obtain some anticoagulation.  A Bentson wire was advanced into the superior vena cava and the sheath was exchanged for a short 6-Fr sheath.    Based on the imaging, a 8 mm x 60 mm angioplasty balloon was selected.  The balloon was centered around the left innominate vein and inflated to 12 ATM for 2 minutes.  On completion imaging, a >30 % residual stenosis was present.    At this point, the balloon was exchanged for a 10 mm x 40 mm angioplasty balloon.  The balloon was centered around the stenosis and inflated to 10 ATM for 2 minutes.  There was obvious waist on this images.  On completion imaging, the distal stenoses in the left innominate vein appeared resolved by there remained a >30% residual stenosis.  While I was considering re-intervening on the left innominate vein, I turned my attention to the forearm.  I centered a 6 mm x 60 mm angioplasty balloon on the forearm cephalic vein stenosis.  I inflated the balloon to 10 atm for 2 minutes.  I deflated and removed the balloon.  Completion imaging demonstrated essentially total resolution of the stenosis.  At this point, I turned my attention to the left chest again.  I placed a 12 mm x 40 mm angioplasty balloon, centered on the residual left innominate vein stenosis.  I inflated the balloon to 4 atm for 2 minutes.  There was obvious waist on  this images.  On completion imaging, the stenosis appeared essential resolved without any residual shadowing that might have been thrombus.  Based on the completion imaging, no further intervention is necessary.  The wire and balloon were removed from the sheath.  A 4-0 Monocryl purse-string suture was sewn around the sheath.  The sheath was removed while tying down the suture.  A sterile bandage was applied to the puncture site.   COMPLICATIONS: none  CONDITION: stable   Adele Barthel, MD, [facility] [place] Office: [phone] Pager: [phone]  04/03/2017 10:46 AM

## 2017-04-04 ENCOUNTER — Telehealth: Payer: Self-pay | Admitting: Vascular Surgery

## 2017-04-04 DIAGNOSIS — N2581 Secondary hyperparathyroidism of renal origin: Secondary | ICD-10-CM | POA: Diagnosis not present

## 2017-04-04 DIAGNOSIS — N186 End stage renal disease: Secondary | ICD-10-CM | POA: Diagnosis not present

## 2017-04-04 DIAGNOSIS — D472 Monoclonal gammopathy: Secondary | ICD-10-CM | POA: Diagnosis not present

## 2017-04-04 DIAGNOSIS — D631 Anemia in chronic kidney disease: Secondary | ICD-10-CM | POA: Diagnosis not present

## 2017-04-04 DIAGNOSIS — Z992 Dependence on renal dialysis: Secondary | ICD-10-CM | POA: Diagnosis not present

## 2017-04-04 NOTE — Telephone Encounter (Signed)
-----   Message from Mena Goes, RN sent at 04/03/2017 11:27 AM EDT ----- Regarding: 3 months w/ Left dialysis duplex   ----- Message ----- From: Conrad Irene, MD Sent: 04/03/2017  10:58 AM To: [facility] [address]  [name] [id number] 12/06/1950  PROCEDURE: 1.  left radiocephalic arteriovenous fistula cannulation under ultrasound guidance 2.  left arm shuntogram 3.  Venoplasty of forearm cephalic vein (6 mm x 60 mm) 4.  Venoplasty of left innominate vein x 3 (8 mm x 60 mm, 10 mm x 40 mm, 12 mm x 40 mm)  Follow-up: 3 months  Orders(s) for follow-up: L access duplex

## 2017-04-04 NOTE — Telephone Encounter (Signed)
LVM on home # for appt on 11-30 [place] & OV, mailed letter

## 2017-04-07 DIAGNOSIS — D472 Monoclonal gammopathy: Secondary | ICD-10-CM | POA: Diagnosis not present

## 2017-04-07 DIAGNOSIS — D509 Iron deficiency anemia, unspecified: Secondary | ICD-10-CM | POA: Diagnosis not present

## 2017-04-07 DIAGNOSIS — N2581 Secondary hyperparathyroidism of renal origin: Secondary | ICD-10-CM | POA: Diagnosis not present

## 2017-04-07 DIAGNOSIS — Z992 Dependence on renal dialysis: Secondary | ICD-10-CM | POA: Diagnosis not present

## 2017-04-07 DIAGNOSIS — D631 Anemia in chronic kidney disease: Secondary | ICD-10-CM | POA: Diagnosis not present

## 2017-04-07 DIAGNOSIS — N186 End stage renal disease: Secondary | ICD-10-CM | POA: Diagnosis not present

## 2017-04-08 DIAGNOSIS — G629 Polyneuropathy, unspecified: Secondary | ICD-10-CM | POA: Diagnosis not present

## 2017-04-08 DIAGNOSIS — J449 Chronic obstructive pulmonary disease, unspecified: Secondary | ICD-10-CM | POA: Diagnosis not present

## 2017-04-08 DIAGNOSIS — C9000 Multiple myeloma not having achieved remission: Secondary | ICD-10-CM | POA: Diagnosis not present

## 2017-04-08 DIAGNOSIS — Z299 Encounter for prophylactic measures, unspecified: Secondary | ICD-10-CM | POA: Diagnosis not present

## 2017-04-08 DIAGNOSIS — I132 Hypertensive heart and chronic kidney disease with heart failure and with stage 5 chronic kidney disease, or end stage renal disease: Secondary | ICD-10-CM | POA: Diagnosis not present

## 2017-04-08 DIAGNOSIS — I4891 Unspecified atrial fibrillation: Secondary | ICD-10-CM | POA: Diagnosis not present

## 2017-04-08 DIAGNOSIS — I10 Essential (primary) hypertension: Secondary | ICD-10-CM | POA: Diagnosis not present

## 2017-04-08 DIAGNOSIS — N186 End stage renal disease: Secondary | ICD-10-CM | POA: Diagnosis not present

## 2017-04-08 DIAGNOSIS — I951 Orthostatic hypotension: Secondary | ICD-10-CM | POA: Diagnosis not present

## 2017-04-08 DIAGNOSIS — M6281 Muscle weakness (generalized): Secondary | ICD-10-CM | POA: Diagnosis not present

## 2017-04-08 DIAGNOSIS — I429 Cardiomyopathy, unspecified: Secondary | ICD-10-CM | POA: Diagnosis not present

## 2017-04-08 DIAGNOSIS — I509 Heart failure, unspecified: Secondary | ICD-10-CM | POA: Diagnosis not present

## 2017-04-09 DIAGNOSIS — D509 Iron deficiency anemia, unspecified: Secondary | ICD-10-CM | POA: Diagnosis not present

## 2017-04-09 DIAGNOSIS — D631 Anemia in chronic kidney disease: Secondary | ICD-10-CM | POA: Diagnosis not present

## 2017-04-09 DIAGNOSIS — D472 Monoclonal gammopathy: Secondary | ICD-10-CM | POA: Diagnosis not present

## 2017-04-09 DIAGNOSIS — N186 End stage renal disease: Secondary | ICD-10-CM | POA: Diagnosis not present

## 2017-04-09 DIAGNOSIS — Z992 Dependence on renal dialysis: Secondary | ICD-10-CM | POA: Diagnosis not present

## 2017-04-09 DIAGNOSIS — N2581 Secondary hyperparathyroidism of renal origin: Secondary | ICD-10-CM | POA: Diagnosis not present

## 2017-04-10 DIAGNOSIS — I951 Orthostatic hypotension: Secondary | ICD-10-CM | POA: Diagnosis not present

## 2017-04-10 DIAGNOSIS — N186 End stage renal disease: Secondary | ICD-10-CM | POA: Diagnosis not present

## 2017-04-10 DIAGNOSIS — I132 Hypertensive heart and chronic kidney disease with heart failure and with stage 5 chronic kidney disease, or end stage renal disease: Secondary | ICD-10-CM | POA: Diagnosis not present

## 2017-04-10 DIAGNOSIS — I509 Heart failure, unspecified: Secondary | ICD-10-CM | POA: Diagnosis not present

## 2017-04-10 DIAGNOSIS — C9000 Multiple myeloma not having achieved remission: Secondary | ICD-10-CM | POA: Diagnosis not present

## 2017-04-10 DIAGNOSIS — M6281 Muscle weakness (generalized): Secondary | ICD-10-CM | POA: Diagnosis not present

## 2017-04-11 DIAGNOSIS — D631 Anemia in chronic kidney disease: Secondary | ICD-10-CM | POA: Diagnosis not present

## 2017-04-11 DIAGNOSIS — N186 End stage renal disease: Secondary | ICD-10-CM | POA: Diagnosis not present

## 2017-04-11 DIAGNOSIS — D472 Monoclonal gammopathy: Secondary | ICD-10-CM | POA: Diagnosis not present

## 2017-04-11 DIAGNOSIS — D509 Iron deficiency anemia, unspecified: Secondary | ICD-10-CM | POA: Diagnosis not present

## 2017-04-11 DIAGNOSIS — N2581 Secondary hyperparathyroidism of renal origin: Secondary | ICD-10-CM | POA: Diagnosis not present

## 2017-04-11 DIAGNOSIS — Z992 Dependence on renal dialysis: Secondary | ICD-10-CM | POA: Diagnosis not present

## 2017-04-14 DIAGNOSIS — N2581 Secondary hyperparathyroidism of renal origin: Secondary | ICD-10-CM | POA: Diagnosis not present

## 2017-04-14 DIAGNOSIS — D509 Iron deficiency anemia, unspecified: Secondary | ICD-10-CM | POA: Diagnosis not present

## 2017-04-14 DIAGNOSIS — N186 End stage renal disease: Secondary | ICD-10-CM | POA: Diagnosis not present

## 2017-04-14 DIAGNOSIS — D631 Anemia in chronic kidney disease: Secondary | ICD-10-CM | POA: Diagnosis not present

## 2017-04-14 DIAGNOSIS — Z992 Dependence on renal dialysis: Secondary | ICD-10-CM | POA: Diagnosis not present

## 2017-04-14 DIAGNOSIS — D472 Monoclonal gammopathy: Secondary | ICD-10-CM | POA: Diagnosis not present

## 2017-04-15 DIAGNOSIS — I509 Heart failure, unspecified: Secondary | ICD-10-CM | POA: Diagnosis not present

## 2017-04-15 DIAGNOSIS — I132 Hypertensive heart and chronic kidney disease with heart failure and with stage 5 chronic kidney disease, or end stage renal disease: Secondary | ICD-10-CM | POA: Diagnosis not present

## 2017-04-15 DIAGNOSIS — I951 Orthostatic hypotension: Secondary | ICD-10-CM | POA: Diagnosis not present

## 2017-04-15 DIAGNOSIS — N186 End stage renal disease: Secondary | ICD-10-CM | POA: Diagnosis not present

## 2017-04-15 DIAGNOSIS — M6281 Muscle weakness (generalized): Secondary | ICD-10-CM | POA: Diagnosis not present

## 2017-04-15 DIAGNOSIS — C9000 Multiple myeloma not having achieved remission: Secondary | ICD-10-CM | POA: Diagnosis not present

## 2017-04-16 DIAGNOSIS — D631 Anemia in chronic kidney disease: Secondary | ICD-10-CM | POA: Diagnosis not present

## 2017-04-16 DIAGNOSIS — Z992 Dependence on renal dialysis: Secondary | ICD-10-CM | POA: Diagnosis not present

## 2017-04-16 DIAGNOSIS — N2581 Secondary hyperparathyroidism of renal origin: Secondary | ICD-10-CM | POA: Diagnosis not present

## 2017-04-16 DIAGNOSIS — D472 Monoclonal gammopathy: Secondary | ICD-10-CM | POA: Diagnosis not present

## 2017-04-16 DIAGNOSIS — D509 Iron deficiency anemia, unspecified: Secondary | ICD-10-CM | POA: Diagnosis not present

## 2017-04-16 DIAGNOSIS — N186 End stage renal disease: Secondary | ICD-10-CM | POA: Diagnosis not present

## 2017-04-17 DIAGNOSIS — I951 Orthostatic hypotension: Secondary | ICD-10-CM | POA: Diagnosis not present

## 2017-04-17 DIAGNOSIS — I509 Heart failure, unspecified: Secondary | ICD-10-CM | POA: Diagnosis not present

## 2017-04-17 DIAGNOSIS — M6281 Muscle weakness (generalized): Secondary | ICD-10-CM | POA: Diagnosis not present

## 2017-04-17 DIAGNOSIS — N186 End stage renal disease: Secondary | ICD-10-CM | POA: Diagnosis not present

## 2017-04-17 DIAGNOSIS — I132 Hypertensive heart and chronic kidney disease with heart failure and with stage 5 chronic kidney disease, or end stage renal disease: Secondary | ICD-10-CM | POA: Diagnosis not present

## 2017-04-17 DIAGNOSIS — C9000 Multiple myeloma not having achieved remission: Secondary | ICD-10-CM | POA: Diagnosis not present

## 2017-04-18 DIAGNOSIS — Z992 Dependence on renal dialysis: Secondary | ICD-10-CM | POA: Diagnosis not present

## 2017-04-18 DIAGNOSIS — N2581 Secondary hyperparathyroidism of renal origin: Secondary | ICD-10-CM | POA: Diagnosis not present

## 2017-04-18 DIAGNOSIS — D472 Monoclonal gammopathy: Secondary | ICD-10-CM | POA: Diagnosis not present

## 2017-04-18 DIAGNOSIS — D631 Anemia in chronic kidney disease: Secondary | ICD-10-CM | POA: Diagnosis not present

## 2017-04-18 DIAGNOSIS — N186 End stage renal disease: Secondary | ICD-10-CM | POA: Diagnosis not present

## 2017-04-18 DIAGNOSIS — D509 Iron deficiency anemia, unspecified: Secondary | ICD-10-CM | POA: Diagnosis not present

## 2017-04-21 ENCOUNTER — Ambulatory Visit (INDEPENDENT_AMBULATORY_CARE_PROVIDER_SITE_OTHER): Payer: Medicare Other | Admitting: Cardiovascular Disease

## 2017-04-21 ENCOUNTER — Encounter: Payer: Self-pay | Admitting: Cardiovascular Disease

## 2017-04-21 ENCOUNTER — Telehealth: Payer: Self-pay | Admitting: Cardiovascular Disease

## 2017-04-21 VITALS — BP 114/70 | HR 84 | Ht 73.0 in | Wt 145.0 lb

## 2017-04-21 DIAGNOSIS — I482 Chronic atrial fibrillation, unspecified: Secondary | ICD-10-CM

## 2017-04-21 DIAGNOSIS — I272 Pulmonary hypertension, unspecified: Secondary | ICD-10-CM | POA: Diagnosis not present

## 2017-04-21 DIAGNOSIS — R0602 Shortness of breath: Secondary | ICD-10-CM

## 2017-04-21 DIAGNOSIS — I10 Essential (primary) hypertension: Secondary | ICD-10-CM

## 2017-04-21 DIAGNOSIS — D472 Monoclonal gammopathy: Secondary | ICD-10-CM | POA: Diagnosis not present

## 2017-04-21 DIAGNOSIS — R531 Weakness: Secondary | ICD-10-CM

## 2017-04-21 DIAGNOSIS — N2581 Secondary hyperparathyroidism of renal origin: Secondary | ICD-10-CM | POA: Diagnosis not present

## 2017-04-21 DIAGNOSIS — I519 Heart disease, unspecified: Secondary | ICD-10-CM | POA: Diagnosis not present

## 2017-04-21 DIAGNOSIS — N186 End stage renal disease: Secondary | ICD-10-CM | POA: Diagnosis not present

## 2017-04-21 DIAGNOSIS — Z992 Dependence on renal dialysis: Secondary | ICD-10-CM | POA: Diagnosis not present

## 2017-04-21 DIAGNOSIS — D631 Anemia in chronic kidney disease: Secondary | ICD-10-CM | POA: Diagnosis not present

## 2017-04-21 DIAGNOSIS — I5189 Other ill-defined heart diseases: Secondary | ICD-10-CM

## 2017-04-21 DIAGNOSIS — D509 Iron deficiency anemia, unspecified: Secondary | ICD-10-CM | POA: Diagnosis not present

## 2017-04-21 NOTE — Telephone Encounter (Signed)
Echo scheduled in [facility] Sept 20, 2018

## 2017-04-21 NOTE — Patient Instructions (Addendum)
Medication Instructions:  Continue all current medications.  Labwork:  BMET, CBC - orders given today.  Office will contact with results via phone or letter.    Testing/Procedures:  Your physician has requested that you have an echocardiogram. Echocardiography is a painless test that uses sound waves to create images of your heart. It provides your doctor with information about the size and shape of your heart and how well your heart's chambers and valves are working. This procedure takes approximately one hour. There are no restrictions for this procedure.  Office will contact with results via phone or letter.    Follow-Up: Your physician wants you to follow up in: 6 months.  You will receive a reminder letter in the mail one-two months in advance.  If you don't receive a letter, please call our office to schedule the follow up appointment   Any Other Special Instructions Will Be Listed Below (If Applicable).  If you need a refill on your cardiac medications before your next appointment, please call your pharmacy.

## 2017-04-21 NOTE — Progress Notes (Signed)
SUBJECTIVE: The patient presents for routine follow-up. He reportedly has a history of hypertensive heart disease, chronic atrial fibrillation, multiple myeloma, and severe pulmonary hypertension secondary to severe diastolic dysfunction. He also has end-stage renal disease and is on dialysis.  Echocardiogram in February 2013 demonstrated normal left ventricular systolic function, LVEF 56-21%, moderate concentric LVH, restrictive diastolic physiology, severe pulmonary hypertension, mild mitral and mild to moderate tricuspid regurgitation  His son tells me that for the past one month he has been fatigued with minimal exertion. He is short of breath when tying his shoes or when getting dressed. His son tells me he has not had any recent blood tests.  He was markedly anemic and thrombocytopenic back in May 2018. Chemotherapy has been on hold.   He denies chest pain and chest tightness. His appetite is diminished.   An extra 1 kg of fluid was taken off with hemodialysis but this did not make a difference in his symptoms.   Review of Systems: As per "subjective", otherwise negative.  Allergies  Allergen Reactions  . Other     Hydrocodone cough syrup makes him itch    Current Outpatient Prescriptions  Medication Sig Dispense Refill  . folic acid (FOLVITE) 1 MG tablet Take 1 mg by mouth daily.    [name] gabapentin (NEURONTIN) 300 MG capsule Take 300 mg by mouth 2 (two) times daily.     [name] lanthanum (FOSRENOL) 500 MG chewable tablet Chew 1,000 mg by mouth 3 (three) times daily with meals. And 1 with snacks    . latanoprost (XALATAN) 0.005 % ophthalmic solution Place 1 drop into both eyes at bedtime.  6  . levothyroxine (SYNTHROID, LEVOTHROID) 137 MCG tablet Take 137 mcg by mouth daily before breakfast.    . Multiple Vitamin (DAILY VITE) TABS Take 1 tablet by mouth daily.     . simvastatin (ZOCOR) 20 MG tablet Take 20 mg by mouth daily with supper.       No current facility-administered  medications for this visit.     Past Medical History:  Diagnosis Date  . Atrial fibrillation (HCC)    Permanent, Not on Coumadin because of history of lower GI bleed  . Diastolic dysfunction    Restrictive  diastolic filling pattern echo,  February, 2013  . Dyslipidemia   . Ejection fraction    EF 60-65%, echo, 2013  . ESRD (end stage renal disease) ([place])    Dialysis, fistula in left arm  . Hypertension   . Lower gastrointestinal bleed   . LVH (left ventricular hypertrophy)   . 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 Mitral regurgitation    Mild by echo, 2013  . Multiple myeloma ([place]) 04/30/2016  . Pancytopenia ([place])    Dr Ignacia Bayley  . Pulmonary hypertension (HCC)    Severe, PA pressure 65-70 mm of mercury, echo, 2013  . Tobacco abuse     Past Surgical History:  Procedure Laterality Date  . A/V FISTULAGRAM Left 04/03/2017   Procedure: A/V Fistulagram;  Surgeon: Conrad D'Lo, MD;  Location: [facility];  Service: Cardiovascular;  Laterality: Left;  . AV FISTULA PLACEMENT Left 1992   Left arm   . 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 Marital status: Widowed    Spouse name: N/A  . Number of children: N/A  . Years of education: N/A   Occupational History  . Not on file.   Social History Main Topics  . Smoking status: Never Smoker  . Smokeless tobacco: Never Used  . Alcohol use No  . Drug use: No  . Sexual activity: Not on file   Other Topics Concern  . Not on file   Social History Narrative   Lives alone in a one story home.  Has 2 sons.     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     Education high school.      Vitals:   04/21/17 1410  BP: 114/70  Pulse: 84  SpO2: 96%  Weight: 145 lb (65.8 kg)  Height: _0  (1.854 m)    Wt Readings from Last 3 Encounters:  04/21/17 145 lb (65.8 kg)  04/03/17 140 lb (63.5 kg)  02/18/17 145 lb (65.8 kg)     PHYSICAL EXAM General: NAD, ill appearing HEENT: Normal. Neck: No JVD, no thyromegaly. Lungs: Clear to auscultation bilaterally with normal respiratory effort. CV: Regular rate and irregular rhythm, normal S1/S2, no S3, 3/6 holosystolic murmur heard along left sternal border. No pretibial or periankle edema. Left arm AV fistula.  Abdomen: Soft, nontender, no distention.  Neurologic: Alert and oriented.  Psych: Normal affect. Skin: Normal.    ECG: Most recent ECG reviewed.   Labs: Lab Results  Component Value Date/Time   K 3.6 04/03/2017 07:24 AM   BUN 25 (H) 04/03/2017 07:24 AM   CREATININE 5.10 (H) 04/03/2017 07:24 AM   ALT 14 (L) 12/31/2016 01:40 PM   HGB 11.2 (L) 04/03/2017 07:24 AM     Lipids: No results found for: LDLCALC, LDLDIRECT, CHOL, TRIG, HDL     ASSESSMENT AND PLAN:  1. Chronic atrial fibrillation: Symptomatically stable. No longer on labetalol. Heart rate is controlled without AV nodal blocking agents indicative of conduction system disease. Reportedly unable to be anticoagulated due to h/o GI bleeding. He said this was several years ago. CBC 12/31/16: Hemoglobin 8.6, platelets 65. I previously talked to him about potentially instituting warfarin. He said he wanted to think about it. He would need to see GI first.  2. Severe diastolic dysfunction/pulmonary hypertension: BP controlled. Volume managed with dialysis.  3. ESRD on dialysis.  4. Essential HTN: Controlled. No changes.  5. Shortness of breath with fatigue and generalized weakness: Given his history of anemia and thrombocytopenia with known multiple myeloma, I will check a CBC and basic metabolic panel. I will  also obtain an echocardiogram.     Disposition: Follow up 6 months.   Kate Sable, M.D., F.A.C.C.

## 2017-04-22 DIAGNOSIS — I132 Hypertensive heart and chronic kidney disease with heart failure and with stage 5 chronic kidney disease, or end stage renal disease: Secondary | ICD-10-CM | POA: Diagnosis not present

## 2017-04-22 DIAGNOSIS — I951 Orthostatic hypotension: Secondary | ICD-10-CM | POA: Diagnosis not present

## 2017-04-22 DIAGNOSIS — C9000 Multiple myeloma not having achieved remission: Secondary | ICD-10-CM | POA: Diagnosis not present

## 2017-04-22 DIAGNOSIS — N186 End stage renal disease: Secondary | ICD-10-CM | POA: Diagnosis not present

## 2017-04-22 DIAGNOSIS — M6281 Muscle weakness (generalized): Secondary | ICD-10-CM | POA: Diagnosis not present

## 2017-04-22 DIAGNOSIS — I509 Heart failure, unspecified: Secondary | ICD-10-CM | POA: Diagnosis not present

## 2017-04-23 DIAGNOSIS — D509 Iron deficiency anemia, unspecified: Secondary | ICD-10-CM | POA: Diagnosis not present

## 2017-04-23 DIAGNOSIS — Z992 Dependence on renal dialysis: Secondary | ICD-10-CM | POA: Diagnosis not present

## 2017-04-23 DIAGNOSIS — N2581 Secondary hyperparathyroidism of renal origin: Secondary | ICD-10-CM | POA: Diagnosis not present

## 2017-04-23 DIAGNOSIS — M79671 Pain in right foot: Secondary | ICD-10-CM | POA: Diagnosis not present

## 2017-04-23 DIAGNOSIS — D472 Monoclonal gammopathy: Secondary | ICD-10-CM | POA: Diagnosis not present

## 2017-04-23 DIAGNOSIS — D631 Anemia in chronic kidney disease: Secondary | ICD-10-CM | POA: Diagnosis not present

## 2017-04-23 DIAGNOSIS — N186 End stage renal disease: Secondary | ICD-10-CM | POA: Diagnosis not present

## 2017-04-24 ENCOUNTER — Other Ambulatory Visit: Payer: Self-pay

## 2017-04-24 ENCOUNTER — Ambulatory Visit (INDEPENDENT_AMBULATORY_CARE_PROVIDER_SITE_OTHER): Payer: Medicare Other

## 2017-04-24 ENCOUNTER — Other Ambulatory Visit: Payer: Self-pay | Admitting: Cardiovascular Disease

## 2017-04-24 DIAGNOSIS — R0602 Shortness of breath: Secondary | ICD-10-CM

## 2017-04-24 DIAGNOSIS — N186 End stage renal disease: Secondary | ICD-10-CM | POA: Diagnosis not present

## 2017-04-24 DIAGNOSIS — I951 Orthostatic hypotension: Secondary | ICD-10-CM | POA: Diagnosis not present

## 2017-04-24 DIAGNOSIS — I132 Hypertensive heart and chronic kidney disease with heart failure and with stage 5 chronic kidney disease, or end stage renal disease: Secondary | ICD-10-CM | POA: Diagnosis not present

## 2017-04-24 DIAGNOSIS — C9000 Multiple myeloma not having achieved remission: Secondary | ICD-10-CM | POA: Diagnosis not present

## 2017-04-24 DIAGNOSIS — M6281 Muscle weakness (generalized): Secondary | ICD-10-CM | POA: Diagnosis not present

## 2017-04-24 DIAGNOSIS — I10 Essential (primary) hypertension: Secondary | ICD-10-CM | POA: Diagnosis not present

## 2017-04-24 DIAGNOSIS — I509 Heart failure, unspecified: Secondary | ICD-10-CM | POA: Diagnosis not present

## 2017-04-25 DIAGNOSIS — D472 Monoclonal gammopathy: Secondary | ICD-10-CM | POA: Diagnosis not present

## 2017-04-25 DIAGNOSIS — D631 Anemia in chronic kidney disease: Secondary | ICD-10-CM | POA: Diagnosis not present

## 2017-04-25 DIAGNOSIS — N186 End stage renal disease: Secondary | ICD-10-CM | POA: Diagnosis not present

## 2017-04-25 DIAGNOSIS — D509 Iron deficiency anemia, unspecified: Secondary | ICD-10-CM | POA: Diagnosis not present

## 2017-04-25 DIAGNOSIS — Z992 Dependence on renal dialysis: Secondary | ICD-10-CM | POA: Diagnosis not present

## 2017-04-25 DIAGNOSIS — N2581 Secondary hyperparathyroidism of renal origin: Secondary | ICD-10-CM | POA: Diagnosis not present

## 2017-04-25 LAB — BASIC METABOLIC PANEL
BUN / CREAT RATIO: 9 — AB (ref 10–24)
BUN: 50 mg/dL — AB (ref 8–27)
CO2: 26 mmol/L (ref 20–29)
CREATININE: 5.28 mg/dL — AB (ref 0.76–1.27)
Calcium: 7.3 mg/dL — ABNORMAL LOW (ref 8.6–10.2)
Chloride: 97 mmol/L (ref 96–106)
GFR calc Af Amer: 12 mL/min/{1.73_m2} — ABNORMAL LOW (ref >59)
GFR, EST NON AFRICAN AMERICAN: 11 mL/min/{1.73_m2} — AB (ref >59)
Glucose: 89 mg/dL (ref 65–99)
Potassium: 4.4 mmol/L (ref 3.5–5.2)
SODIUM: 141 mmol/L (ref 134–144)

## 2017-04-25 LAB — CBC
HEMATOCRIT: 32.3 % — AB (ref 37.5–51.0)
Hemoglobin: 9.9 g/dL — ABNORMAL LOW (ref 13.0–17.7)
MCH: 25.9 pg — AB (ref 26.6–33.0)
MCHC: 30.7 g/dL — AB (ref 31.5–35.7)
MCV: 85 fL (ref 79–97)
Platelets: 188 10*3/uL (ref 150–379)
RBC: 3.82 x10E6/uL — AB (ref 4.14–5.80)
RDW: 16.1 % — ABNORMAL HIGH (ref 12.3–15.4)
WBC: 4.4 10*3/uL (ref 3.4–10.8)

## 2017-04-28 DIAGNOSIS — D631 Anemia in chronic kidney disease: Secondary | ICD-10-CM | POA: Diagnosis not present

## 2017-04-28 DIAGNOSIS — N186 End stage renal disease: Secondary | ICD-10-CM | POA: Diagnosis not present

## 2017-04-28 DIAGNOSIS — Z992 Dependence on renal dialysis: Secondary | ICD-10-CM | POA: Diagnosis not present

## 2017-04-28 DIAGNOSIS — D509 Iron deficiency anemia, unspecified: Secondary | ICD-10-CM | POA: Diagnosis not present

## 2017-04-28 DIAGNOSIS — D472 Monoclonal gammopathy: Secondary | ICD-10-CM | POA: Diagnosis not present

## 2017-04-28 DIAGNOSIS — N2581 Secondary hyperparathyroidism of renal origin: Secondary | ICD-10-CM | POA: Diagnosis not present

## 2017-04-29 DIAGNOSIS — N186 End stage renal disease: Secondary | ICD-10-CM | POA: Diagnosis not present

## 2017-04-29 DIAGNOSIS — M6281 Muscle weakness (generalized): Secondary | ICD-10-CM | POA: Diagnosis not present

## 2017-04-29 DIAGNOSIS — C9000 Multiple myeloma not having achieved remission: Secondary | ICD-10-CM | POA: Diagnosis not present

## 2017-04-29 DIAGNOSIS — I132 Hypertensive heart and chronic kidney disease with heart failure and with stage 5 chronic kidney disease, or end stage renal disease: Secondary | ICD-10-CM | POA: Diagnosis not present

## 2017-04-29 DIAGNOSIS — I951 Orthostatic hypotension: Secondary | ICD-10-CM | POA: Diagnosis not present

## 2017-04-29 DIAGNOSIS — I509 Heart failure, unspecified: Secondary | ICD-10-CM | POA: Diagnosis not present

## 2017-04-30 ENCOUNTER — Other Ambulatory Visit: Payer: Self-pay | Admitting: Ophthalmology

## 2017-04-30 ENCOUNTER — Encounter: Payer: Self-pay | Admitting: Cardiovascular Disease

## 2017-04-30 DIAGNOSIS — N186 End stage renal disease: Secondary | ICD-10-CM | POA: Diagnosis not present

## 2017-04-30 DIAGNOSIS — N2581 Secondary hyperparathyroidism of renal origin: Secondary | ICD-10-CM | POA: Diagnosis not present

## 2017-04-30 DIAGNOSIS — H469 Unspecified optic neuritis: Secondary | ICD-10-CM

## 2017-04-30 DIAGNOSIS — D472 Monoclonal gammopathy: Secondary | ICD-10-CM | POA: Diagnosis not present

## 2017-04-30 DIAGNOSIS — D631 Anemia in chronic kidney disease: Secondary | ICD-10-CM | POA: Diagnosis not present

## 2017-04-30 DIAGNOSIS — Z992 Dependence on renal dialysis: Secondary | ICD-10-CM | POA: Diagnosis not present

## 2017-04-30 DIAGNOSIS — D509 Iron deficiency anemia, unspecified: Secondary | ICD-10-CM | POA: Diagnosis not present

## 2017-05-01 ENCOUNTER — Ambulatory Visit (INDEPENDENT_AMBULATORY_CARE_PROVIDER_SITE_OTHER): Payer: Medicare Other | Admitting: Cardiovascular Disease

## 2017-05-01 ENCOUNTER — Encounter: Payer: Self-pay | Admitting: Cardiovascular Disease

## 2017-05-01 ENCOUNTER — Encounter (INDEPENDENT_AMBULATORY_CARE_PROVIDER_SITE_OTHER): Payer: Self-pay

## 2017-05-01 VITALS — BP 100/80 | HR 78 | Ht 73.0 in

## 2017-05-01 DIAGNOSIS — M6281 Muscle weakness (generalized): Secondary | ICD-10-CM | POA: Diagnosis not present

## 2017-05-01 DIAGNOSIS — H538 Other visual disturbances: Secondary | ICD-10-CM | POA: Diagnosis not present

## 2017-05-01 DIAGNOSIS — I132 Hypertensive heart and chronic kidney disease with heart failure and with stage 5 chronic kidney disease, or end stage renal disease: Secondary | ICD-10-CM | POA: Diagnosis not present

## 2017-05-01 DIAGNOSIS — I951 Orthostatic hypotension: Secondary | ICD-10-CM | POA: Diagnosis not present

## 2017-05-01 DIAGNOSIS — R0602 Shortness of breath: Secondary | ICD-10-CM | POA: Diagnosis not present

## 2017-05-01 DIAGNOSIS — Z8673 Personal history of transient ischemic attack (TIA), and cerebral infarction without residual deficits: Secondary | ICD-10-CM | POA: Diagnosis not present

## 2017-05-01 DIAGNOSIS — I509 Heart failure, unspecified: Secondary | ICD-10-CM | POA: Diagnosis not present

## 2017-05-01 DIAGNOSIS — C9000 Multiple myeloma not having achieved remission: Secondary | ICD-10-CM | POA: Diagnosis not present

## 2017-05-01 DIAGNOSIS — I359 Nonrheumatic aortic valve disorder, unspecified: Secondary | ICD-10-CM | POA: Diagnosis not present

## 2017-05-01 DIAGNOSIS — N186 End stage renal disease: Secondary | ICD-10-CM | POA: Diagnosis not present

## 2017-05-01 DIAGNOSIS — H469 Unspecified optic neuritis: Secondary | ICD-10-CM | POA: Diagnosis not present

## 2017-05-01 NOTE — Patient Instructions (Signed)
Medication Instructions:  Your physician recommends that you continue on your current medications as directed. Please refer to the Current Medication list given to you today.  Labwork: No new orders.   Testing/Procedures: A chest x-ray takes a picture of the organs and structures inside the chest, including the heart, lungs, and blood vessels. This test can show several things, including, whether the heart is enlarges; whether fluid is building up in the lungs; and whether pacemaker / defibrillator leads are still in place. (Order given to the patient to have this done at [facility] today)  Follow-Up: Your physician recommends that you schedule a follow-up appointment as needed with Dr Burt Knack.  Your physician wants you to follow-up in: 6 MONTHS with Dr Bronson Ing and have an Echocardiogram.  You will receive a reminder letter in the mail two months in advance. If you don't receive a letter, please call our office to schedule the follow-up appointment.   Any Other Special Instructions Will Be Listed Below (If Applicable).     If you need a refill on your cardiac medications before your next appointment, please call your pharmacy.

## 2017-05-01 NOTE — Progress Notes (Signed)
Cardiology Office Note Date:  05/01/2017   ID:  Brian Liu, DOB 12/30/1950, MRN [id number]  PCP:  Brian Chroman, MD  Cardiologist:  Dr Brian Liu  Chief Complaint  Patient presents with  . Shortness of Breath    History of Present Illness: Brian Liu is a 66 y.o. male who presents for TAVR evaluation, referred by Dr Brian Liu.   The patient is here with his son today. He has multiple comorbid medical conditions including hypertensive heart disease, chronic atrial fibrillation, multiple myeloma, ESRD on hemodialysis, and severe pulmonary hypertension secondary to severe diastolic dysfunction. He has a history of remote kidney transplant which ultimately failed approximately 14 years ago.   He is now complaining of progressive shortness of breath, now with minimal activity. He is wheelchair bound, too weak to get up and do anything. He lives with his son who is there with him 24/7 and takes care of him. He dialyzes Monday, Wednesday, and Friday. He has not had a lot of problems tolerating dialysis or with excess fluid. He denies chest pain, lightheadedness, or syncope.   He was hospitalized in June at [facility], reportedly with marked weakness, dehydration, side effects of chemotherapy. He was hospitalized for 2 weeks and then in rehab x 2 months, and is now back home. Prior to that he was hospitalized at [facility] in May with the same problems (nausea, vomiting, dehydration). Chemotherapy is now on hold because of his poor functional status.   Past Medical History:  Diagnosis Date  . Atrial fibrillation (HCC)    Permanent, Not on Coumadin because of history of lower GI bleed  . Diastolic dysfunction    Restrictive  diastolic filling pattern echo,  February, 2013  . Dyslipidemia   . Ejection fraction    EF 60-65%, echo, 2013  . ESRD (end stage renal disease) ([place])    Dialysis, fistula in left arm  . Hypertension   . Lower gastrointestinal bleed   . LVH (left  ventricular hypertrophy)   . 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 Mitral regurgitation    Mild by echo, 2013  . Multiple myeloma ([place]) 04/30/2016  . Pancytopenia ([place])    Dr Brian Liu  . Pulmonary hypertension (HCC)    Severe, PA pressure 65-70 mm of mercury, echo, 2013  . Tobacco abuse     Past Surgical History:  Procedure Laterality Date  . A/V FISTULAGRAM Left 04/03/2017   Procedure: A/V Fistulagram;  Surgeon: Conrad Whitehorse, MD;  Location: [facility];  Service: Cardiovascular;  Laterality: Left;  . AV FISTULA PLACEMENT Left 1992   Left arm   . 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 cinacalcet (SENSIPAR) 30 MG tablet Take 30 mg by mouth daily.    . dorzolamide-timolol (COSOPT) 22.3-6.8 MG/ML ophthalmic solution Place 1 drop into both eyes at bedtime.    . folic acid (FOLVITE) 1 MG tablet Take 1 mg by mouth daily.    [name] lanthanum (FOSRENOL) 500 MG chewable tablet Chew 1,000 mg by mouth 3 (three) times daily with meals. And 1 with snacks    . Latanoprostene Bunod (VYZULTA) 0.024 %  SOLN Place 1 drop into both eyes daily.    . midodrine (PROAMATINE) 10 MG tablet Take 5 mg by mouth daily.    . mirtazapine (REMERON) 7.5 MG tablet Take 7.5 mg by mouth at bedtime.    . Multiple Vitamin (DAILY VITE) TABS Take 1 tablet by mouth daily.     . simvastatin (ZOCOR) 20 MG tablet Take 20 mg by mouth daily with supper.       No current facility-administered medications for this visit.     Allergies:   Other   Social History:  The patient  reports that he has never smoked. He has never used smokeless tobacco. He  reports that he does not drink alcohol or use drugs.   Family History:  The patient's  family history includes Healthy in his son; Kidney failure in his son; Other in his mother; Stomach cancer in his father.    ROS:  Please see the history of present illness.  Otherwise, review of systems is positive for poor appetite, orthopnea.  All other systems are reviewed and negative.    PHYSICAL EXAM: VS:  BP 100/80   Pulse 78   Ht _0  (1.854 m)   SpO2 98%  , BMI There is no height or weight on file to calculate BMI. GEN: frail-appearing gentleman, in no acute distress  HEENT: normal  Neck: no JVD, no masses. bilateral carotid bruits Cardiac: irregular with 3/6 harsh systolic murmur at the RUSB, no diastolic murmur               Respiratory:  clear to auscultation bilaterally except for diminished breath sounds at the bases, normal work of breathing GI: soft, nontender, nondistended, + BS MS: no deformity or atrophy  Ext: no pretibial edema Skin: warm and dry, no rash Neuro:  Strength and sensation are intact Psych: euthymic mood, full affect  EKG:  EKG is not ordered today.  Recent Labs: 12/24/2016: Magnesium 2.0 12/27/2016: B Natriuretic Peptide 423.0 12/31/2016: ALT 14 04/24/2017: BUN 50; Creatinine, Ser 5.28; Hemoglobin 9.9; Platelets 188; Potassium 4.4; Sodium 141   Lipid Panel  No results found for: CHOL, TRIG, HDL, CHOLHDL, VLDL, LDLCALC, LDLDIRECT    Wt Readings from Last 3 Encounters:  04/21/17 65.8 kg (145 lb)  04/03/17 63.5 kg (140 lb)  02/18/17 65.8 kg (145 lb)     Cardiac Studies Reviewed: 2D Echo 04/24/2017: Study Conclusions  - Left ventricle: The cavity size was normal. Wall thickness was   increased in a pattern of moderate LVH. Systolic function was   vigorous. The estimated ejection fraction was in the range of 65%   to 70%. Wall motion was normal; there were no regional wall   motion abnormalities. The study was not technically sufficient to   allow  evaluation of LV diastolic dysfunction due to atrial   fibrillation. Doppler parameters are consistent with high   ventricular filling pressure. - Aortic valve: While peak velocity and mean gradient are not   supportive, there is severely reduced cusp excursion indicative   of severe aortic stenosis. Moderately calcified leaflets. Cusp   separation was severely reduced. There was trivial regurgitation.   Peak velocity (S): 254 cm/s. Mean gradient (S): 17 mm Hg. Valve   area (VTI): 0.71 cm^2. Valve area (Vmax): 0.74 cm^2. Valve area   (Vmean): 0.77 cm^2. - Mitral valve: Calcified annulus. Moderately thickened, moderately   calcified leaflets . There was moderate regurgitation. - Left atrium: The atrium was severely dilated. - Right ventricle:  The cavity size was mildly to moderately   dilated. Systolic function was severely reduced. - Right atrium: The atrium was severely dilated. - Atrial septum: No defect or patent foramen ovale was identified. - Tricuspid valve: There was moderate regurgitation. - Pulmonary arteries: PA peak pressure: 62 mm Hg (S). - Systemic veins: Dilated IVC with normal respiratory variation.   Estimated RAP 8 mmHg.  Left ventricle:  The cavity size was normal. Wall thickness was increased in a pattern of moderate LVH. Systolic function was vigorous. The estimated ejection fraction was in the range of 65% to 70%. Wall motion was normal; there were no regional wall motion abnormalities. The study was not technically sufficient to allow evaluation of LV diastolic dysfunction due to atrial fibrillation. Doppler parameters are consistent with high ventricular filling pressure.  ------------------------------------------------------------------- Aortic valve:  While peak velocity and mean gradient are not supportive, there is severely reduced cusp excursion indicative of severe aortic stenosis.  Moderately calcified leaflets. Cusp separation was severely reduced.   Doppler:  There was trivial regurgitation.    Valve area (VTI): 0.71 cm^2. Indexed valve area (VTI): 0.39 cm^2/m^2. Peak velocity ratio of LVOT to aortic valve: 0.26. Valve area (Vmax): 0.74 cm^2. Indexed valve area (Vmax): 0.4 cm^2/m^2. Mean velocity ratio of LVOT to aortic valve: 0.27. Valve area (Vmean): 0.77 cm^2. Indexed valve area (Vmean): 0.42 cm^2/m^2.    Mean gradient (S): 17 mm Hg. Peak gradient (S): 26 mm Hg.  ------------------------------------------------------------------- Aorta:  Aortic root: The aortic root was normal in size.  ------------------------------------------------------------------- Mitral valve:   Calcified annulus. Moderately thickened, moderately calcified leaflets .  Doppler:  There was moderate regurgitation.  Peak gradient (D): 10 mm Hg.  ------------------------------------------------------------------- Left atrium:  The atrium was severely dilated.  ------------------------------------------------------------------- Atrial septum:  No defect or patent foramen ovale was identified.   ------------------------------------------------------------------- Right ventricle:  The cavity size was mildly to moderately dilated. Systolic function was severely reduced.  ------------------------------------------------------------------- Pulmonic valve:    The valve appears to be grossly normal. Doppler:  There was trivial regurgitation.  ------------------------------------------------------------------- Tricuspid valve:   The valve appears to be grossly normal. Doppler:  There was moderate regurgitation.  ------------------------------------------------------------------- Right atrium:  The atrium was severely dilated.  ------------------------------------------------------------------- Pericardium:  There was no pericardial effusion.  ------------------------------------------------------------------- Systemic veins:  Dilated IVC with  normal respiratory variation. Estimated RAP 8 mmHg.  ASSESSMENT AND PLAN: 66 yo male with multiple comorbid medical conditions and very poor functional status who has aortic stenosis with NYHA III symptoms of chronic diastolic heart failure. I have personally reviewed his echo images and agree the valve leaflets are calcified and restricted. Doppler data however does not suggest severe aortic stenosis with a mean gradient of only 17 mmHg in the setting of normal LV systolic function. This patient does have a small left ventricle with hyperdynamic function, a scenario sometimes seen in patient's with severe, paradoxical low-flow low-gradient aortic stenosis.   I have reviewed the natural history of aortic stenosis with the patient and their family members  who are present today. We have discussed the limitations of medical therapy and the poor prognosis associated with symptomatic aortic stenosis. We have reviewed potential treatment options, including palliative medical therapy, conventional surgical aortic valve replacement, and transcatheter aortic valve replacement. We discussed treatment options in the context of the patient's specific comorbid medical conditions.   Considering the patient's marked weakness, nearly wheelchair bound, presence of multiple myeloma not tolerating therapy, and longstanding ESRD on dialysis cumulatively for 20  years, I do not think he is a candidate for TAVR at this time. I think the potential for harm in putting him through the workup and TAVR surgery probably outweighs the potential benefit. This is discussed at length with the patient and his son. They understand and agree. I have recommended that he follow-up with Dr Brian Liu in about 6 months with an echo prior to that visit to reassess his clinical condition. I would be happy to see him back in the future if his functional capacity improves.   In reviewing his echo images, there appears to be a significant pleural  effusion. An order is placed for a chest xray to better evaluate.   Current medicines are reviewed with the patient today.  The patient does not have concerns regarding medicines.  Labs/ tests ordered today include:  No orders of the defined types were placed in this encounter.  Disposition:   FU prn  Signed, Sherren Mocha, MD  05/01/2017 2:22 PM    [facility] [place], [place], [place]  [address] Phone: [phone]; Fax: [phone]

## 2017-05-02 DIAGNOSIS — D631 Anemia in chronic kidney disease: Secondary | ICD-10-CM | POA: Diagnosis not present

## 2017-05-02 DIAGNOSIS — N2581 Secondary hyperparathyroidism of renal origin: Secondary | ICD-10-CM | POA: Diagnosis not present

## 2017-05-02 DIAGNOSIS — D509 Iron deficiency anemia, unspecified: Secondary | ICD-10-CM | POA: Diagnosis not present

## 2017-05-02 DIAGNOSIS — Z992 Dependence on renal dialysis: Secondary | ICD-10-CM | POA: Diagnosis not present

## 2017-05-02 DIAGNOSIS — N186 End stage renal disease: Secondary | ICD-10-CM | POA: Diagnosis not present

## 2017-05-02 DIAGNOSIS — D472 Monoclonal gammopathy: Secondary | ICD-10-CM | POA: Diagnosis not present

## 2017-05-04 DIAGNOSIS — Z992 Dependence on renal dialysis: Secondary | ICD-10-CM | POA: Diagnosis not present

## 2017-05-04 DIAGNOSIS — N186 End stage renal disease: Secondary | ICD-10-CM | POA: Diagnosis not present

## 2017-05-05 DIAGNOSIS — Z992 Dependence on renal dialysis: Secondary | ICD-10-CM | POA: Diagnosis not present

## 2017-05-05 DIAGNOSIS — Z23 Encounter for immunization: Secondary | ICD-10-CM | POA: Diagnosis not present

## 2017-05-05 DIAGNOSIS — D472 Monoclonal gammopathy: Secondary | ICD-10-CM | POA: Diagnosis not present

## 2017-05-05 DIAGNOSIS — D509 Iron deficiency anemia, unspecified: Secondary | ICD-10-CM | POA: Diagnosis not present

## 2017-05-05 DIAGNOSIS — N2581 Secondary hyperparathyroidism of renal origin: Secondary | ICD-10-CM | POA: Diagnosis not present

## 2017-05-05 DIAGNOSIS — D631 Anemia in chronic kidney disease: Secondary | ICD-10-CM | POA: Diagnosis not present

## 2017-05-05 DIAGNOSIS — N186 End stage renal disease: Secondary | ICD-10-CM | POA: Diagnosis not present

## 2017-05-06 DIAGNOSIS — M6281 Muscle weakness (generalized): Secondary | ICD-10-CM | POA: Diagnosis not present

## 2017-05-06 DIAGNOSIS — C9000 Multiple myeloma not having achieved remission: Secondary | ICD-10-CM | POA: Diagnosis not present

## 2017-05-06 DIAGNOSIS — N186 End stage renal disease: Secondary | ICD-10-CM | POA: Diagnosis not present

## 2017-05-06 DIAGNOSIS — I132 Hypertensive heart and chronic kidney disease with heart failure and with stage 5 chronic kidney disease, or end stage renal disease: Secondary | ICD-10-CM | POA: Diagnosis not present

## 2017-05-06 DIAGNOSIS — I951 Orthostatic hypotension: Secondary | ICD-10-CM | POA: Diagnosis not present

## 2017-05-06 DIAGNOSIS — I509 Heart failure, unspecified: Secondary | ICD-10-CM | POA: Diagnosis not present

## 2017-05-07 DIAGNOSIS — Z23 Encounter for immunization: Secondary | ICD-10-CM | POA: Diagnosis not present

## 2017-05-07 DIAGNOSIS — D472 Monoclonal gammopathy: Secondary | ICD-10-CM | POA: Diagnosis not present

## 2017-05-07 DIAGNOSIS — D509 Iron deficiency anemia, unspecified: Secondary | ICD-10-CM | POA: Diagnosis not present

## 2017-05-07 DIAGNOSIS — N186 End stage renal disease: Secondary | ICD-10-CM | POA: Diagnosis not present

## 2017-05-07 DIAGNOSIS — N2581 Secondary hyperparathyroidism of renal origin: Secondary | ICD-10-CM | POA: Diagnosis not present

## 2017-05-07 DIAGNOSIS — D631 Anemia in chronic kidney disease: Secondary | ICD-10-CM | POA: Diagnosis not present

## 2017-05-08 DIAGNOSIS — I132 Hypertensive heart and chronic kidney disease with heart failure and with stage 5 chronic kidney disease, or end stage renal disease: Secondary | ICD-10-CM | POA: Diagnosis not present

## 2017-05-08 DIAGNOSIS — M6281 Muscle weakness (generalized): Secondary | ICD-10-CM | POA: Diagnosis not present

## 2017-05-08 DIAGNOSIS — N186 End stage renal disease: Secondary | ICD-10-CM | POA: Diagnosis not present

## 2017-05-08 DIAGNOSIS — I951 Orthostatic hypotension: Secondary | ICD-10-CM | POA: Diagnosis not present

## 2017-05-08 DIAGNOSIS — I509 Heart failure, unspecified: Secondary | ICD-10-CM | POA: Diagnosis not present

## 2017-05-08 DIAGNOSIS — C9000 Multiple myeloma not having achieved remission: Secondary | ICD-10-CM | POA: Diagnosis not present

## 2017-05-09 DIAGNOSIS — D509 Iron deficiency anemia, unspecified: Secondary | ICD-10-CM | POA: Diagnosis not present

## 2017-05-09 DIAGNOSIS — D472 Monoclonal gammopathy: Secondary | ICD-10-CM | POA: Diagnosis not present

## 2017-05-09 DIAGNOSIS — N2581 Secondary hyperparathyroidism of renal origin: Secondary | ICD-10-CM | POA: Diagnosis not present

## 2017-05-09 DIAGNOSIS — N186 End stage renal disease: Secondary | ICD-10-CM | POA: Diagnosis not present

## 2017-05-09 DIAGNOSIS — D631 Anemia in chronic kidney disease: Secondary | ICD-10-CM | POA: Diagnosis not present

## 2017-05-09 DIAGNOSIS — Z23 Encounter for immunization: Secondary | ICD-10-CM | POA: Diagnosis not present

## 2017-05-12 DIAGNOSIS — Z23 Encounter for immunization: Secondary | ICD-10-CM | POA: Diagnosis not present

## 2017-05-12 DIAGNOSIS — D509 Iron deficiency anemia, unspecified: Secondary | ICD-10-CM | POA: Diagnosis not present

## 2017-05-12 DIAGNOSIS — D472 Monoclonal gammopathy: Secondary | ICD-10-CM | POA: Diagnosis not present

## 2017-05-12 DIAGNOSIS — D631 Anemia in chronic kidney disease: Secondary | ICD-10-CM | POA: Diagnosis not present

## 2017-05-12 DIAGNOSIS — N186 End stage renal disease: Secondary | ICD-10-CM | POA: Diagnosis not present

## 2017-05-12 DIAGNOSIS — N2581 Secondary hyperparathyroidism of renal origin: Secondary | ICD-10-CM | POA: Diagnosis not present

## 2017-05-13 DIAGNOSIS — I951 Orthostatic hypotension: Secondary | ICD-10-CM | POA: Diagnosis not present

## 2017-05-13 DIAGNOSIS — H401133 Primary open-angle glaucoma, bilateral, severe stage: Secondary | ICD-10-CM | POA: Diagnosis not present

## 2017-05-13 DIAGNOSIS — N186 End stage renal disease: Secondary | ICD-10-CM | POA: Diagnosis not present

## 2017-05-13 DIAGNOSIS — M6281 Muscle weakness (generalized): Secondary | ICD-10-CM | POA: Diagnosis not present

## 2017-05-13 DIAGNOSIS — C9000 Multiple myeloma not having achieved remission: Secondary | ICD-10-CM | POA: Diagnosis not present

## 2017-05-13 DIAGNOSIS — I132 Hypertensive heart and chronic kidney disease with heart failure and with stage 5 chronic kidney disease, or end stage renal disease: Secondary | ICD-10-CM | POA: Diagnosis not present

## 2017-05-13 DIAGNOSIS — I509 Heart failure, unspecified: Secondary | ICD-10-CM | POA: Diagnosis not present

## 2017-05-14 DIAGNOSIS — N186 End stage renal disease: Secondary | ICD-10-CM | POA: Diagnosis not present

## 2017-05-14 DIAGNOSIS — D631 Anemia in chronic kidney disease: Secondary | ICD-10-CM | POA: Diagnosis not present

## 2017-05-14 DIAGNOSIS — N2581 Secondary hyperparathyroidism of renal origin: Secondary | ICD-10-CM | POA: Diagnosis not present

## 2017-05-14 DIAGNOSIS — D509 Iron deficiency anemia, unspecified: Secondary | ICD-10-CM | POA: Diagnosis not present

## 2017-05-14 DIAGNOSIS — Z23 Encounter for immunization: Secondary | ICD-10-CM | POA: Diagnosis not present

## 2017-05-14 DIAGNOSIS — D472 Monoclonal gammopathy: Secondary | ICD-10-CM | POA: Diagnosis not present

## 2017-05-15 DIAGNOSIS — N186 End stage renal disease: Secondary | ICD-10-CM | POA: Diagnosis not present

## 2017-05-15 DIAGNOSIS — I951 Orthostatic hypotension: Secondary | ICD-10-CM | POA: Diagnosis not present

## 2017-05-15 DIAGNOSIS — I509 Heart failure, unspecified: Secondary | ICD-10-CM | POA: Diagnosis not present

## 2017-05-15 DIAGNOSIS — I132 Hypertensive heart and chronic kidney disease with heart failure and with stage 5 chronic kidney disease, or end stage renal disease: Secondary | ICD-10-CM | POA: Diagnosis not present

## 2017-05-15 DIAGNOSIS — C9000 Multiple myeloma not having achieved remission: Secondary | ICD-10-CM | POA: Diagnosis not present

## 2017-05-15 DIAGNOSIS — M6281 Muscle weakness (generalized): Secondary | ICD-10-CM | POA: Diagnosis not present

## 2017-05-16 DIAGNOSIS — Z23 Encounter for immunization: Secondary | ICD-10-CM | POA: Diagnosis not present

## 2017-05-16 DIAGNOSIS — D631 Anemia in chronic kidney disease: Secondary | ICD-10-CM | POA: Diagnosis not present

## 2017-05-16 DIAGNOSIS — N186 End stage renal disease: Secondary | ICD-10-CM | POA: Diagnosis not present

## 2017-05-16 DIAGNOSIS — D472 Monoclonal gammopathy: Secondary | ICD-10-CM | POA: Diagnosis not present

## 2017-05-16 DIAGNOSIS — D509 Iron deficiency anemia, unspecified: Secondary | ICD-10-CM | POA: Diagnosis not present

## 2017-05-16 DIAGNOSIS — N2581 Secondary hyperparathyroidism of renal origin: Secondary | ICD-10-CM | POA: Diagnosis not present

## 2017-05-19 DIAGNOSIS — D509 Iron deficiency anemia, unspecified: Secondary | ICD-10-CM | POA: Diagnosis not present

## 2017-05-19 DIAGNOSIS — N186 End stage renal disease: Secondary | ICD-10-CM | POA: Diagnosis not present

## 2017-05-19 DIAGNOSIS — Z23 Encounter for immunization: Secondary | ICD-10-CM | POA: Diagnosis not present

## 2017-05-19 DIAGNOSIS — D631 Anemia in chronic kidney disease: Secondary | ICD-10-CM | POA: Diagnosis not present

## 2017-05-19 DIAGNOSIS — D472 Monoclonal gammopathy: Secondary | ICD-10-CM | POA: Diagnosis not present

## 2017-05-19 DIAGNOSIS — N2581 Secondary hyperparathyroidism of renal origin: Secondary | ICD-10-CM | POA: Diagnosis not present

## 2017-05-20 DIAGNOSIS — I509 Heart failure, unspecified: Secondary | ICD-10-CM | POA: Diagnosis not present

## 2017-05-20 DIAGNOSIS — I951 Orthostatic hypotension: Secondary | ICD-10-CM | POA: Diagnosis not present

## 2017-05-20 DIAGNOSIS — N186 End stage renal disease: Secondary | ICD-10-CM | POA: Diagnosis not present

## 2017-05-20 DIAGNOSIS — M6281 Muscle weakness (generalized): Secondary | ICD-10-CM | POA: Diagnosis not present

## 2017-05-20 DIAGNOSIS — I132 Hypertensive heart and chronic kidney disease with heart failure and with stage 5 chronic kidney disease, or end stage renal disease: Secondary | ICD-10-CM | POA: Diagnosis not present

## 2017-05-20 DIAGNOSIS — C9000 Multiple myeloma not having achieved remission: Secondary | ICD-10-CM | POA: Diagnosis not present

## 2017-05-21 DIAGNOSIS — D472 Monoclonal gammopathy: Secondary | ICD-10-CM | POA: Diagnosis not present

## 2017-05-21 DIAGNOSIS — N2581 Secondary hyperparathyroidism of renal origin: Secondary | ICD-10-CM | POA: Diagnosis not present

## 2017-05-21 DIAGNOSIS — D509 Iron deficiency anemia, unspecified: Secondary | ICD-10-CM | POA: Diagnosis not present

## 2017-05-21 DIAGNOSIS — D631 Anemia in chronic kidney disease: Secondary | ICD-10-CM | POA: Diagnosis not present

## 2017-05-21 DIAGNOSIS — N186 End stage renal disease: Secondary | ICD-10-CM | POA: Diagnosis not present

## 2017-05-21 DIAGNOSIS — Z23 Encounter for immunization: Secondary | ICD-10-CM | POA: Diagnosis not present

## 2017-05-22 DIAGNOSIS — I509 Heart failure, unspecified: Secondary | ICD-10-CM | POA: Diagnosis not present

## 2017-05-22 DIAGNOSIS — M6281 Muscle weakness (generalized): Secondary | ICD-10-CM | POA: Diagnosis not present

## 2017-05-22 DIAGNOSIS — I951 Orthostatic hypotension: Secondary | ICD-10-CM | POA: Diagnosis not present

## 2017-05-22 DIAGNOSIS — C9000 Multiple myeloma not having achieved remission: Secondary | ICD-10-CM | POA: Diagnosis not present

## 2017-05-22 DIAGNOSIS — N186 End stage renal disease: Secondary | ICD-10-CM | POA: Diagnosis not present

## 2017-05-22 DIAGNOSIS — I132 Hypertensive heart and chronic kidney disease with heart failure and with stage 5 chronic kidney disease, or end stage renal disease: Secondary | ICD-10-CM | POA: Diagnosis not present

## 2017-05-23 DIAGNOSIS — D631 Anemia in chronic kidney disease: Secondary | ICD-10-CM | POA: Diagnosis not present

## 2017-05-23 DIAGNOSIS — D509 Iron deficiency anemia, unspecified: Secondary | ICD-10-CM | POA: Diagnosis not present

## 2017-05-23 DIAGNOSIS — D472 Monoclonal gammopathy: Secondary | ICD-10-CM | POA: Diagnosis not present

## 2017-05-23 DIAGNOSIS — Z23 Encounter for immunization: Secondary | ICD-10-CM | POA: Diagnosis not present

## 2017-05-23 DIAGNOSIS — N186 End stage renal disease: Secondary | ICD-10-CM | POA: Diagnosis not present

## 2017-05-23 DIAGNOSIS — N2581 Secondary hyperparathyroidism of renal origin: Secondary | ICD-10-CM | POA: Diagnosis not present

## 2017-05-26 DIAGNOSIS — Z8521 Personal history of malignant neoplasm of larynx: Secondary | ICD-10-CM | POA: Diagnosis not present

## 2017-05-26 DIAGNOSIS — Z940 Kidney transplant status: Secondary | ICD-10-CM | POA: Diagnosis not present

## 2017-05-26 DIAGNOSIS — N186 End stage renal disease: Secondary | ICD-10-CM | POA: Diagnosis not present

## 2017-05-26 DIAGNOSIS — D472 Monoclonal gammopathy: Secondary | ICD-10-CM | POA: Diagnosis not present

## 2017-05-26 DIAGNOSIS — R197 Diarrhea, unspecified: Secondary | ICD-10-CM | POA: Diagnosis not present

## 2017-05-26 DIAGNOSIS — E039 Hypothyroidism, unspecified: Secondary | ICD-10-CM | POA: Diagnosis not present

## 2017-05-26 DIAGNOSIS — Z992 Dependence on renal dialysis: Secondary | ICD-10-CM | POA: Diagnosis not present

## 2017-05-26 DIAGNOSIS — I4891 Unspecified atrial fibrillation: Secondary | ICD-10-CM | POA: Diagnosis not present

## 2017-05-26 DIAGNOSIS — M6281 Muscle weakness (generalized): Secondary | ICD-10-CM | POA: Diagnosis not present

## 2017-05-26 DIAGNOSIS — D630 Anemia in neoplastic disease: Secondary | ICD-10-CM | POA: Diagnosis not present

## 2017-05-26 DIAGNOSIS — N2581 Secondary hyperparathyroidism of renal origin: Secondary | ICD-10-CM | POA: Diagnosis not present

## 2017-05-26 DIAGNOSIS — E785 Hyperlipidemia, unspecified: Secondary | ICD-10-CM | POA: Diagnosis not present

## 2017-05-26 DIAGNOSIS — C9000 Multiple myeloma not having achieved remission: Secondary | ICD-10-CM | POA: Diagnosis not present

## 2017-05-26 DIAGNOSIS — I509 Heart failure, unspecified: Secondary | ICD-10-CM | POA: Diagnosis not present

## 2017-05-26 DIAGNOSIS — I951 Orthostatic hypotension: Secondary | ICD-10-CM | POA: Diagnosis not present

## 2017-05-26 DIAGNOSIS — I132 Hypertensive heart and chronic kidney disease with heart failure and with stage 5 chronic kidney disease, or end stage renal disease: Secondary | ICD-10-CM | POA: Diagnosis not present

## 2017-05-26 DIAGNOSIS — D631 Anemia in chronic kidney disease: Secondary | ICD-10-CM | POA: Diagnosis not present

## 2017-05-26 DIAGNOSIS — I272 Pulmonary hypertension, unspecified: Secondary | ICD-10-CM | POA: Diagnosis not present

## 2017-05-26 DIAGNOSIS — D509 Iron deficiency anemia, unspecified: Secondary | ICD-10-CM | POA: Diagnosis not present

## 2017-05-26 DIAGNOSIS — Z23 Encounter for immunization: Secondary | ICD-10-CM | POA: Diagnosis not present

## 2017-05-27 DIAGNOSIS — I951 Orthostatic hypotension: Secondary | ICD-10-CM | POA: Diagnosis not present

## 2017-05-27 DIAGNOSIS — C9000 Multiple myeloma not having achieved remission: Secondary | ICD-10-CM | POA: Diagnosis not present

## 2017-05-27 DIAGNOSIS — I132 Hypertensive heart and chronic kidney disease with heart failure and with stage 5 chronic kidney disease, or end stage renal disease: Secondary | ICD-10-CM | POA: Diagnosis not present

## 2017-05-27 DIAGNOSIS — M6281 Muscle weakness (generalized): Secondary | ICD-10-CM | POA: Diagnosis not present

## 2017-05-27 DIAGNOSIS — I509 Heart failure, unspecified: Secondary | ICD-10-CM | POA: Diagnosis not present

## 2017-05-27 DIAGNOSIS — N186 End stage renal disease: Secondary | ICD-10-CM | POA: Diagnosis not present

## 2017-05-28 DIAGNOSIS — N2581 Secondary hyperparathyroidism of renal origin: Secondary | ICD-10-CM | POA: Diagnosis not present

## 2017-05-28 DIAGNOSIS — Z23 Encounter for immunization: Secondary | ICD-10-CM | POA: Diagnosis not present

## 2017-05-28 DIAGNOSIS — D509 Iron deficiency anemia, unspecified: Secondary | ICD-10-CM | POA: Diagnosis not present

## 2017-05-28 DIAGNOSIS — D472 Monoclonal gammopathy: Secondary | ICD-10-CM | POA: Diagnosis not present

## 2017-05-28 DIAGNOSIS — D631 Anemia in chronic kidney disease: Secondary | ICD-10-CM | POA: Diagnosis not present

## 2017-05-28 DIAGNOSIS — N186 End stage renal disease: Secondary | ICD-10-CM | POA: Diagnosis not present

## 2017-05-29 DIAGNOSIS — C9000 Multiple myeloma not having achieved remission: Secondary | ICD-10-CM | POA: Diagnosis not present

## 2017-05-29 DIAGNOSIS — M6281 Muscle weakness (generalized): Secondary | ICD-10-CM | POA: Diagnosis not present

## 2017-05-29 DIAGNOSIS — I509 Heart failure, unspecified: Secondary | ICD-10-CM | POA: Diagnosis not present

## 2017-05-29 DIAGNOSIS — N186 End stage renal disease: Secondary | ICD-10-CM | POA: Diagnosis not present

## 2017-05-29 DIAGNOSIS — I132 Hypertensive heart and chronic kidney disease with heart failure and with stage 5 chronic kidney disease, or end stage renal disease: Secondary | ICD-10-CM | POA: Diagnosis not present

## 2017-05-29 DIAGNOSIS — I951 Orthostatic hypotension: Secondary | ICD-10-CM | POA: Diagnosis not present

## 2017-05-30 DIAGNOSIS — Z23 Encounter for immunization: Secondary | ICD-10-CM | POA: Diagnosis not present

## 2017-05-30 DIAGNOSIS — D509 Iron deficiency anemia, unspecified: Secondary | ICD-10-CM | POA: Diagnosis not present

## 2017-05-30 DIAGNOSIS — D631 Anemia in chronic kidney disease: Secondary | ICD-10-CM | POA: Diagnosis not present

## 2017-05-30 DIAGNOSIS — N186 End stage renal disease: Secondary | ICD-10-CM | POA: Diagnosis not present

## 2017-05-30 DIAGNOSIS — N2581 Secondary hyperparathyroidism of renal origin: Secondary | ICD-10-CM | POA: Diagnosis not present

## 2017-05-30 DIAGNOSIS — D472 Monoclonal gammopathy: Secondary | ICD-10-CM | POA: Diagnosis not present

## 2017-06-02 DIAGNOSIS — D509 Iron deficiency anemia, unspecified: Secondary | ICD-10-CM | POA: Diagnosis not present

## 2017-06-02 DIAGNOSIS — Z23 Encounter for immunization: Secondary | ICD-10-CM | POA: Diagnosis not present

## 2017-06-02 DIAGNOSIS — N2581 Secondary hyperparathyroidism of renal origin: Secondary | ICD-10-CM | POA: Diagnosis not present

## 2017-06-02 DIAGNOSIS — N186 End stage renal disease: Secondary | ICD-10-CM | POA: Diagnosis not present

## 2017-06-02 DIAGNOSIS — D472 Monoclonal gammopathy: Secondary | ICD-10-CM | POA: Diagnosis not present

## 2017-06-02 DIAGNOSIS — D631 Anemia in chronic kidney disease: Secondary | ICD-10-CM | POA: Diagnosis not present

## 2017-06-03 ENCOUNTER — Other Ambulatory Visit: Payer: Self-pay

## 2017-06-03 DIAGNOSIS — Z992 Dependence on renal dialysis: Secondary | ICD-10-CM | POA: Diagnosis not present

## 2017-06-03 DIAGNOSIS — C9000 Multiple myeloma not having achieved remission: Secondary | ICD-10-CM | POA: Diagnosis not present

## 2017-06-03 DIAGNOSIS — I951 Orthostatic hypotension: Secondary | ICD-10-CM | POA: Diagnosis not present

## 2017-06-03 DIAGNOSIS — M6281 Muscle weakness (generalized): Secondary | ICD-10-CM | POA: Diagnosis not present

## 2017-06-03 DIAGNOSIS — I132 Hypertensive heart and chronic kidney disease with heart failure and with stage 5 chronic kidney disease, or end stage renal disease: Secondary | ICD-10-CM | POA: Diagnosis not present

## 2017-06-03 DIAGNOSIS — N186 End stage renal disease: Secondary | ICD-10-CM | POA: Diagnosis not present

## 2017-06-03 DIAGNOSIS — I509 Heart failure, unspecified: Secondary | ICD-10-CM | POA: Diagnosis not present

## 2017-06-03 DIAGNOSIS — Z48812 Encounter for surgical aftercare following surgery on the circulatory system: Secondary | ICD-10-CM

## 2017-06-04 DIAGNOSIS — D509 Iron deficiency anemia, unspecified: Secondary | ICD-10-CM | POA: Diagnosis not present

## 2017-06-04 DIAGNOSIS — D472 Monoclonal gammopathy: Secondary | ICD-10-CM | POA: Diagnosis not present

## 2017-06-04 DIAGNOSIS — D631 Anemia in chronic kidney disease: Secondary | ICD-10-CM | POA: Diagnosis not present

## 2017-06-04 DIAGNOSIS — N2581 Secondary hyperparathyroidism of renal origin: Secondary | ICD-10-CM | POA: Diagnosis not present

## 2017-06-04 DIAGNOSIS — N186 End stage renal disease: Secondary | ICD-10-CM | POA: Diagnosis not present

## 2017-06-04 DIAGNOSIS — Z23 Encounter for immunization: Secondary | ICD-10-CM | POA: Diagnosis not present

## 2017-06-05 DIAGNOSIS — I951 Orthostatic hypotension: Secondary | ICD-10-CM | POA: Diagnosis not present

## 2017-06-05 DIAGNOSIS — M6281 Muscle weakness (generalized): Secondary | ICD-10-CM | POA: Diagnosis not present

## 2017-06-05 DIAGNOSIS — C9000 Multiple myeloma not having achieved remission: Secondary | ICD-10-CM | POA: Diagnosis not present

## 2017-06-05 DIAGNOSIS — I132 Hypertensive heart and chronic kidney disease with heart failure and with stage 5 chronic kidney disease, or end stage renal disease: Secondary | ICD-10-CM | POA: Diagnosis not present

## 2017-06-05 DIAGNOSIS — I509 Heart failure, unspecified: Secondary | ICD-10-CM | POA: Diagnosis not present

## 2017-06-05 DIAGNOSIS — N186 End stage renal disease: Secondary | ICD-10-CM | POA: Diagnosis not present

## 2017-06-06 DIAGNOSIS — D472 Monoclonal gammopathy: Secondary | ICD-10-CM | POA: Diagnosis not present

## 2017-06-06 DIAGNOSIS — Z992 Dependence on renal dialysis: Secondary | ICD-10-CM | POA: Diagnosis not present

## 2017-06-06 DIAGNOSIS — N2581 Secondary hyperparathyroidism of renal origin: Secondary | ICD-10-CM | POA: Diagnosis not present

## 2017-06-06 DIAGNOSIS — N186 End stage renal disease: Secondary | ICD-10-CM | POA: Diagnosis not present

## 2017-06-09 DIAGNOSIS — Z992 Dependence on renal dialysis: Secondary | ICD-10-CM | POA: Diagnosis not present

## 2017-06-09 DIAGNOSIS — D472 Monoclonal gammopathy: Secondary | ICD-10-CM | POA: Diagnosis not present

## 2017-06-09 DIAGNOSIS — N2581 Secondary hyperparathyroidism of renal origin: Secondary | ICD-10-CM | POA: Diagnosis not present

## 2017-06-09 DIAGNOSIS — N186 End stage renal disease: Secondary | ICD-10-CM | POA: Diagnosis not present

## 2017-06-10 DIAGNOSIS — I951 Orthostatic hypotension: Secondary | ICD-10-CM | POA: Diagnosis not present

## 2017-06-10 DIAGNOSIS — I509 Heart failure, unspecified: Secondary | ICD-10-CM | POA: Diagnosis not present

## 2017-06-10 DIAGNOSIS — M6281 Muscle weakness (generalized): Secondary | ICD-10-CM | POA: Diagnosis not present

## 2017-06-10 DIAGNOSIS — I132 Hypertensive heart and chronic kidney disease with heart failure and with stage 5 chronic kidney disease, or end stage renal disease: Secondary | ICD-10-CM | POA: Diagnosis not present

## 2017-06-10 DIAGNOSIS — N186 End stage renal disease: Secondary | ICD-10-CM | POA: Diagnosis not present

## 2017-06-10 DIAGNOSIS — C9000 Multiple myeloma not having achieved remission: Secondary | ICD-10-CM | POA: Diagnosis not present

## 2017-06-11 DIAGNOSIS — D472 Monoclonal gammopathy: Secondary | ICD-10-CM | POA: Diagnosis not present

## 2017-06-11 DIAGNOSIS — Z992 Dependence on renal dialysis: Secondary | ICD-10-CM | POA: Diagnosis not present

## 2017-06-11 DIAGNOSIS — N186 End stage renal disease: Secondary | ICD-10-CM | POA: Diagnosis not present

## 2017-06-11 DIAGNOSIS — N2581 Secondary hyperparathyroidism of renal origin: Secondary | ICD-10-CM | POA: Diagnosis not present

## 2017-06-11 NOTE — Progress Notes (Deleted)
Postoperative Visit ([facility])   History of Present Illness   Brian Liu is a 66 y.o. male who presents cc:  Left central venous stenosis.  Prior procedure include: 1. PTA L forearm cephalic vein and innominate vein (04/03/17)  The patient's wounds are *** healed.  The patient notes *** resolution of lower extremity symptoms.  The patient is *** able to complete their activities of daily living.  The patient's current symptoms are: ***.  Past Medical History, Past Surgical History, Social History, Family History, Medications, Allergies, and Review of Systems are unchanged from previous evaluation on ***.  Current Outpatient Medications  Medication Sig Dispense Refill  . cinacalcet (SENSIPAR) 30 MG tablet Take 30 mg by mouth daily.    . dorzolamide-timolol (COSOPT) 22.3-6.8 MG/ML ophthalmic solution Place 1 drop into both eyes at bedtime.    . folic acid (FOLVITE) 1 MG tablet Take 1 mg by mouth daily.    [name] lanthanum (FOSRENOL) 500 MG chewable tablet Chew 1,000 mg by mouth 3 (three) times daily with meals. And 1 with snacks    . Latanoprostene Bunod (VYZULTA) 0.024 % SOLN Place 1 drop into both eyes daily.    . midodrine (PROAMATINE) 10 MG tablet Take 5 mg by mouth daily.    . mirtazapine (REMERON) 7.5 MG tablet Take 7.5 mg by mouth at bedtime.    . Multiple Vitamin (DAILY VITE) TABS Take 1 tablet by mouth daily.     . simvastatin (ZOCOR) 20 MG tablet Take 20 mg by mouth daily with supper.       No current facility-administered medications for this visit.     ROS: ***,***   For VQI Use Only   PRE-ADM LIVING: {VQI Pre-admission Living:20973}  AMB STATUS: {VQI Ambulatory Status:20974}   Physical Examination  ***There were no vitals filed for this visit. ***There is no height or weight on file to calculate BMI.  General {LOC:19197::"Somulent","Alert"}, {Orientation:19197::"Confused","O x 3"}, {Weight:19197::"Obese","Cachectic","WD"}, {General state of health:19197::"Ill  appearing","NAD"}  Pulmonary {Chest wall:19197::"Asx chest movement","Sym exp"}, {Air movt:19197::"Decreased *** air movt","good B air movt"}, {BS:19197::"rales on ***","rhonchi on ***","wheezing on ***","CTA B"}  Cardiac {Rhythm:19197::"Irregularly, irregular rate and rhythm","RRR, Nl S1, S2"}, {Murmur:19197::"Murmur present: ***","no Murmurs"}, {Rubs:19197::"Rub present: ***","No rubs"}, {Gallop:19197::"Gallop present: ***","No S3,S4"}  Vascular Vessel Right Left  Radial {Palpable:19197::"Not palpable","Faintly palpable","Palpable"} {Palpable:19197::"Not palpable","Faintly palpable","Palpable"}  Brachial {Palpable:19197::"Not palpable","Faintly palpable","Palpable"} {Palpable:19197::"Not palpable","Faintly palpable","Palpable"}  Carotid Palpable, {Bruit:19197::"Bruit present","No Bruit"} Palpable, {Bruit:19197::"Bruit present","No Bruit"}  Aorta Not palpable N/A  Femoral {Palpable:19197::"Not palpable","Faintly palpable","Palpable"} {Palpable:19197::"Not palpable","Faintly palpable","Palpable"}  Popliteal {Palpable:19197::"Prominently palpable","Not palpable"} {Palpable:19197::"Prominently palpable","Not palpable"}  PT {Palpable:19197::"Not palpable","Faintly palpable","Palpable"} {Palpable:19197::"Not palpable","Faintly palpable","Palpable"}  DP {Palpable:19197::"Not palpable","Faintly palpable","Palpable"} {Palpable:19197::"Not palpable","Faintly palpable","Palpable"}    Gastrointestinal soft, {Distension:19197::"distended","non-distended"}, {TTP:19197::"TTP in *** quadrant","appropriate tenderness to palpation","non-tender to palpation"}, {Guarding:19197::"Guarding and rebound in *** quadrant","No guarding or rebound"}, {HSM:19197::"HSM present","no HSM"}, {Masses:19197::"Mass present: ***","no masses"}, {Flank:19197::"CVAT on ***","Flank bruit present on ***","no CVAT B"}, {AAA:19197::"Palpable prominent aortic pulse","No palpable prominent aortic pulse"}, {Surgical incision:19197::"Surg  incisions: ***","Surgical incisions well healed"," "}  Musculoskeletal M/S 5/5 throughout {MS:19197::"except ***"," "}, Extremities without ischemic changes {MS:19197::"except ***"," "}, {Edema:19197::"Edema present: ***","No edema present"},  Neurologic  Pain and light touch intact in extremities{CN:19197::" except for decreased sensation in ***"," "}, Motor exam as listed above    Medical Decision Making   Brian Liu is a 66 y.o. male who presents s/p ***.  Based on his angiographic findings, this patient needs: ***. I discussed in depth with the patient the nature of atherosclerosis, and emphasized the importance of maximal  medical management including strict control of blood pressure, blood glucose, and lipid levels, obtaining regular exercise, and cessation of smoking.  The patient is aware that without maximal medical management the underlying atherosclerotic disease process will progress, limiting the benefit of any interventions. The patient is currently on a statin: Zocor.  The patient is currently on an anti-platelet: ASA.  Thank you for allowing [place] to participate in this patient's care.   Adele Barthel, MD, FACS [facility] Office: [phone] Pager: [phone]

## 2017-06-12 DIAGNOSIS — C9000 Multiple myeloma not having achieved remission: Secondary | ICD-10-CM | POA: Diagnosis not present

## 2017-06-12 DIAGNOSIS — I132 Hypertensive heart and chronic kidney disease with heart failure and with stage 5 chronic kidney disease, or end stage renal disease: Secondary | ICD-10-CM | POA: Diagnosis not present

## 2017-06-12 DIAGNOSIS — M6281 Muscle weakness (generalized): Secondary | ICD-10-CM | POA: Diagnosis not present

## 2017-06-12 DIAGNOSIS — I509 Heart failure, unspecified: Secondary | ICD-10-CM | POA: Diagnosis not present

## 2017-06-12 DIAGNOSIS — I951 Orthostatic hypotension: Secondary | ICD-10-CM | POA: Diagnosis not present

## 2017-06-12 DIAGNOSIS — N186 End stage renal disease: Secondary | ICD-10-CM | POA: Diagnosis not present

## 2017-06-13 ENCOUNTER — Encounter: Payer: Medicare Other | Admitting: Vascular Surgery

## 2017-06-13 ENCOUNTER — Encounter (HOSPITAL_COMMUNITY): Payer: Medicare Other

## 2017-06-13 DIAGNOSIS — Z992 Dependence on renal dialysis: Secondary | ICD-10-CM | POA: Diagnosis not present

## 2017-06-13 DIAGNOSIS — N2581 Secondary hyperparathyroidism of renal origin: Secondary | ICD-10-CM | POA: Diagnosis not present

## 2017-06-13 DIAGNOSIS — N186 End stage renal disease: Secondary | ICD-10-CM | POA: Diagnosis not present

## 2017-06-13 DIAGNOSIS — D472 Monoclonal gammopathy: Secondary | ICD-10-CM | POA: Diagnosis not present

## 2017-06-16 DIAGNOSIS — N186 End stage renal disease: Secondary | ICD-10-CM | POA: Diagnosis not present

## 2017-06-16 DIAGNOSIS — Z992 Dependence on renal dialysis: Secondary | ICD-10-CM | POA: Diagnosis not present

## 2017-06-16 DIAGNOSIS — D472 Monoclonal gammopathy: Secondary | ICD-10-CM | POA: Diagnosis not present

## 2017-06-16 DIAGNOSIS — N2581 Secondary hyperparathyroidism of renal origin: Secondary | ICD-10-CM | POA: Diagnosis not present

## 2017-06-18 DIAGNOSIS — N2581 Secondary hyperparathyroidism of renal origin: Secondary | ICD-10-CM | POA: Diagnosis not present

## 2017-06-18 DIAGNOSIS — N186 End stage renal disease: Secondary | ICD-10-CM | POA: Diagnosis not present

## 2017-06-18 DIAGNOSIS — D472 Monoclonal gammopathy: Secondary | ICD-10-CM | POA: Diagnosis not present

## 2017-06-18 DIAGNOSIS — Z992 Dependence on renal dialysis: Secondary | ICD-10-CM | POA: Diagnosis not present

## 2017-06-20 DIAGNOSIS — D472 Monoclonal gammopathy: Secondary | ICD-10-CM | POA: Diagnosis not present

## 2017-06-20 DIAGNOSIS — N186 End stage renal disease: Secondary | ICD-10-CM | POA: Diagnosis not present

## 2017-06-20 DIAGNOSIS — Z992 Dependence on renal dialysis: Secondary | ICD-10-CM | POA: Diagnosis not present

## 2017-06-20 DIAGNOSIS — N2581 Secondary hyperparathyroidism of renal origin: Secondary | ICD-10-CM | POA: Diagnosis not present

## 2017-06-23 DIAGNOSIS — Z992 Dependence on renal dialysis: Secondary | ICD-10-CM | POA: Diagnosis not present

## 2017-06-23 DIAGNOSIS — N186 End stage renal disease: Secondary | ICD-10-CM | POA: Diagnosis not present

## 2017-06-23 DIAGNOSIS — D472 Monoclonal gammopathy: Secondary | ICD-10-CM | POA: Diagnosis not present

## 2017-06-23 DIAGNOSIS — N2581 Secondary hyperparathyroidism of renal origin: Secondary | ICD-10-CM | POA: Diagnosis not present

## 2017-06-25 DIAGNOSIS — D472 Monoclonal gammopathy: Secondary | ICD-10-CM | POA: Diagnosis not present

## 2017-06-25 DIAGNOSIS — N186 End stage renal disease: Secondary | ICD-10-CM | POA: Diagnosis not present

## 2017-06-25 DIAGNOSIS — Z992 Dependence on renal dialysis: Secondary | ICD-10-CM | POA: Diagnosis not present

## 2017-06-25 DIAGNOSIS — N2581 Secondary hyperparathyroidism of renal origin: Secondary | ICD-10-CM | POA: Diagnosis not present

## 2017-06-27 DIAGNOSIS — D472 Monoclonal gammopathy: Secondary | ICD-10-CM | POA: Diagnosis not present

## 2017-06-27 DIAGNOSIS — Z992 Dependence on renal dialysis: Secondary | ICD-10-CM | POA: Diagnosis not present

## 2017-06-27 DIAGNOSIS — N2581 Secondary hyperparathyroidism of renal origin: Secondary | ICD-10-CM | POA: Diagnosis not present

## 2017-06-27 DIAGNOSIS — N186 End stage renal disease: Secondary | ICD-10-CM | POA: Diagnosis not present

## 2017-06-30 DIAGNOSIS — N2581 Secondary hyperparathyroidism of renal origin: Secondary | ICD-10-CM | POA: Diagnosis not present

## 2017-06-30 DIAGNOSIS — Z992 Dependence on renal dialysis: Secondary | ICD-10-CM | POA: Diagnosis not present

## 2017-06-30 DIAGNOSIS — D472 Monoclonal gammopathy: Secondary | ICD-10-CM | POA: Diagnosis not present

## 2017-06-30 DIAGNOSIS — N186 End stage renal disease: Secondary | ICD-10-CM | POA: Diagnosis not present

## 2017-07-02 DIAGNOSIS — Z992 Dependence on renal dialysis: Secondary | ICD-10-CM | POA: Diagnosis not present

## 2017-07-02 DIAGNOSIS — N186 End stage renal disease: Secondary | ICD-10-CM | POA: Diagnosis not present

## 2017-07-02 DIAGNOSIS — N2581 Secondary hyperparathyroidism of renal origin: Secondary | ICD-10-CM | POA: Diagnosis not present

## 2017-07-02 DIAGNOSIS — D472 Monoclonal gammopathy: Secondary | ICD-10-CM | POA: Diagnosis not present

## 2017-07-04 ENCOUNTER — Encounter (HOSPITAL_COMMUNITY): Payer: Medicare Other

## 2017-07-04 ENCOUNTER — Encounter: Payer: Medicare Other | Admitting: Vascular Surgery

## 2017-07-04 DIAGNOSIS — N2581 Secondary hyperparathyroidism of renal origin: Secondary | ICD-10-CM | POA: Diagnosis not present

## 2017-07-04 DIAGNOSIS — N186 End stage renal disease: Secondary | ICD-10-CM | POA: Diagnosis not present

## 2017-07-04 DIAGNOSIS — D472 Monoclonal gammopathy: Secondary | ICD-10-CM | POA: Diagnosis not present

## 2017-07-04 DIAGNOSIS — Z992 Dependence on renal dialysis: Secondary | ICD-10-CM | POA: Diagnosis not present

## 2017-07-07 DIAGNOSIS — N186 End stage renal disease: Secondary | ICD-10-CM | POA: Diagnosis not present

## 2017-07-07 DIAGNOSIS — N2581 Secondary hyperparathyroidism of renal origin: Secondary | ICD-10-CM | POA: Diagnosis not present

## 2017-07-07 DIAGNOSIS — D509 Iron deficiency anemia, unspecified: Secondary | ICD-10-CM | POA: Diagnosis not present

## 2017-07-07 DIAGNOSIS — D472 Monoclonal gammopathy: Secondary | ICD-10-CM | POA: Diagnosis not present

## 2017-07-07 DIAGNOSIS — Z992 Dependence on renal dialysis: Secondary | ICD-10-CM | POA: Diagnosis not present

## 2017-07-09 DIAGNOSIS — D509 Iron deficiency anemia, unspecified: Secondary | ICD-10-CM | POA: Diagnosis not present

## 2017-07-09 DIAGNOSIS — D472 Monoclonal gammopathy: Secondary | ICD-10-CM | POA: Diagnosis not present

## 2017-07-09 DIAGNOSIS — N186 End stage renal disease: Secondary | ICD-10-CM | POA: Diagnosis not present

## 2017-07-09 DIAGNOSIS — N2581 Secondary hyperparathyroidism of renal origin: Secondary | ICD-10-CM | POA: Diagnosis not present

## 2017-07-09 DIAGNOSIS — Z992 Dependence on renal dialysis: Secondary | ICD-10-CM | POA: Diagnosis not present

## 2017-07-11 DIAGNOSIS — N2581 Secondary hyperparathyroidism of renal origin: Secondary | ICD-10-CM | POA: Diagnosis not present

## 2017-07-11 DIAGNOSIS — N186 End stage renal disease: Secondary | ICD-10-CM | POA: Diagnosis not present

## 2017-07-11 DIAGNOSIS — D472 Monoclonal gammopathy: Secondary | ICD-10-CM | POA: Diagnosis not present

## 2017-07-11 DIAGNOSIS — D509 Iron deficiency anemia, unspecified: Secondary | ICD-10-CM | POA: Diagnosis not present

## 2017-07-11 DIAGNOSIS — Z992 Dependence on renal dialysis: Secondary | ICD-10-CM | POA: Diagnosis not present

## 2017-07-14 DIAGNOSIS — Z992 Dependence on renal dialysis: Secondary | ICD-10-CM | POA: Diagnosis not present

## 2017-07-14 DIAGNOSIS — D509 Iron deficiency anemia, unspecified: Secondary | ICD-10-CM | POA: Diagnosis not present

## 2017-07-14 DIAGNOSIS — N186 End stage renal disease: Secondary | ICD-10-CM | POA: Diagnosis not present

## 2017-07-14 DIAGNOSIS — N2581 Secondary hyperparathyroidism of renal origin: Secondary | ICD-10-CM | POA: Diagnosis not present

## 2017-07-14 DIAGNOSIS — D472 Monoclonal gammopathy: Secondary | ICD-10-CM | POA: Diagnosis not present

## 2017-07-16 DIAGNOSIS — N186 End stage renal disease: Secondary | ICD-10-CM | POA: Diagnosis not present

## 2017-07-16 DIAGNOSIS — N2581 Secondary hyperparathyroidism of renal origin: Secondary | ICD-10-CM | POA: Diagnosis not present

## 2017-07-16 DIAGNOSIS — D472 Monoclonal gammopathy: Secondary | ICD-10-CM | POA: Diagnosis not present

## 2017-07-16 DIAGNOSIS — Z992 Dependence on renal dialysis: Secondary | ICD-10-CM | POA: Diagnosis not present

## 2017-07-16 DIAGNOSIS — D509 Iron deficiency anemia, unspecified: Secondary | ICD-10-CM | POA: Diagnosis not present

## 2017-07-18 DIAGNOSIS — N2581 Secondary hyperparathyroidism of renal origin: Secondary | ICD-10-CM | POA: Diagnosis not present

## 2017-07-18 DIAGNOSIS — D472 Monoclonal gammopathy: Secondary | ICD-10-CM | POA: Diagnosis not present

## 2017-07-18 DIAGNOSIS — Z992 Dependence on renal dialysis: Secondary | ICD-10-CM | POA: Diagnosis not present

## 2017-07-18 DIAGNOSIS — N186 End stage renal disease: Secondary | ICD-10-CM | POA: Diagnosis not present

## 2017-07-18 DIAGNOSIS — D509 Iron deficiency anemia, unspecified: Secondary | ICD-10-CM | POA: Diagnosis not present

## 2017-07-21 DIAGNOSIS — D472 Monoclonal gammopathy: Secondary | ICD-10-CM | POA: Diagnosis not present

## 2017-07-21 DIAGNOSIS — D509 Iron deficiency anemia, unspecified: Secondary | ICD-10-CM | POA: Diagnosis not present

## 2017-07-21 DIAGNOSIS — Z992 Dependence on renal dialysis: Secondary | ICD-10-CM | POA: Diagnosis not present

## 2017-07-21 DIAGNOSIS — N186 End stage renal disease: Secondary | ICD-10-CM | POA: Diagnosis not present

## 2017-07-21 DIAGNOSIS — N2581 Secondary hyperparathyroidism of renal origin: Secondary | ICD-10-CM | POA: Diagnosis not present

## 2017-07-23 DIAGNOSIS — N2581 Secondary hyperparathyroidism of renal origin: Secondary | ICD-10-CM | POA: Diagnosis not present

## 2017-07-23 DIAGNOSIS — N186 End stage renal disease: Secondary | ICD-10-CM | POA: Diagnosis not present

## 2017-07-23 DIAGNOSIS — D472 Monoclonal gammopathy: Secondary | ICD-10-CM | POA: Diagnosis not present

## 2017-07-23 DIAGNOSIS — D509 Iron deficiency anemia, unspecified: Secondary | ICD-10-CM | POA: Diagnosis not present

## 2017-07-23 DIAGNOSIS — Z992 Dependence on renal dialysis: Secondary | ICD-10-CM | POA: Diagnosis not present

## 2017-07-24 DIAGNOSIS — M79676 Pain in unspecified toe(s): Secondary | ICD-10-CM | POA: Diagnosis not present

## 2017-07-24 DIAGNOSIS — L84 Corns and callosities: Secondary | ICD-10-CM | POA: Diagnosis not present

## 2017-07-24 DIAGNOSIS — B351 Tinea unguium: Secondary | ICD-10-CM | POA: Diagnosis not present

## 2017-07-24 DIAGNOSIS — G609 Hereditary and idiopathic neuropathy, unspecified: Secondary | ICD-10-CM | POA: Diagnosis not present

## 2017-07-25 DIAGNOSIS — N2581 Secondary hyperparathyroidism of renal origin: Secondary | ICD-10-CM | POA: Diagnosis not present

## 2017-07-25 DIAGNOSIS — D472 Monoclonal gammopathy: Secondary | ICD-10-CM | POA: Diagnosis not present

## 2017-07-25 DIAGNOSIS — N186 End stage renal disease: Secondary | ICD-10-CM | POA: Diagnosis not present

## 2017-07-25 DIAGNOSIS — Z992 Dependence on renal dialysis: Secondary | ICD-10-CM | POA: Diagnosis not present

## 2017-07-25 DIAGNOSIS — D509 Iron deficiency anemia, unspecified: Secondary | ICD-10-CM | POA: Diagnosis not present

## 2017-07-27 DIAGNOSIS — N186 End stage renal disease: Secondary | ICD-10-CM | POA: Diagnosis not present

## 2017-07-27 DIAGNOSIS — D472 Monoclonal gammopathy: Secondary | ICD-10-CM | POA: Diagnosis not present

## 2017-07-27 DIAGNOSIS — Z992 Dependence on renal dialysis: Secondary | ICD-10-CM | POA: Diagnosis not present

## 2017-07-27 DIAGNOSIS — D509 Iron deficiency anemia, unspecified: Secondary | ICD-10-CM | POA: Diagnosis not present

## 2017-07-27 DIAGNOSIS — N2581 Secondary hyperparathyroidism of renal origin: Secondary | ICD-10-CM | POA: Diagnosis not present

## 2017-07-30 DIAGNOSIS — D509 Iron deficiency anemia, unspecified: Secondary | ICD-10-CM | POA: Diagnosis not present

## 2017-07-30 DIAGNOSIS — D472 Monoclonal gammopathy: Secondary | ICD-10-CM | POA: Diagnosis not present

## 2017-07-30 DIAGNOSIS — N2581 Secondary hyperparathyroidism of renal origin: Secondary | ICD-10-CM | POA: Diagnosis not present

## 2017-07-30 DIAGNOSIS — N186 End stage renal disease: Secondary | ICD-10-CM | POA: Diagnosis not present

## 2017-07-30 DIAGNOSIS — Z992 Dependence on renal dialysis: Secondary | ICD-10-CM | POA: Diagnosis not present

## 2017-08-01 DIAGNOSIS — N186 End stage renal disease: Secondary | ICD-10-CM | POA: Diagnosis not present

## 2017-08-01 DIAGNOSIS — Z992 Dependence on renal dialysis: Secondary | ICD-10-CM | POA: Diagnosis not present

## 2017-08-01 DIAGNOSIS — N2581 Secondary hyperparathyroidism of renal origin: Secondary | ICD-10-CM | POA: Diagnosis not present

## 2017-08-01 DIAGNOSIS — D472 Monoclonal gammopathy: Secondary | ICD-10-CM | POA: Diagnosis not present

## 2017-08-01 DIAGNOSIS — D509 Iron deficiency anemia, unspecified: Secondary | ICD-10-CM | POA: Diagnosis not present

## 2017-08-03 DIAGNOSIS — N186 End stage renal disease: Secondary | ICD-10-CM | POA: Diagnosis not present

## 2017-08-03 DIAGNOSIS — D472 Monoclonal gammopathy: Secondary | ICD-10-CM | POA: Diagnosis not present

## 2017-08-03 DIAGNOSIS — D509 Iron deficiency anemia, unspecified: Secondary | ICD-10-CM | POA: Diagnosis not present

## 2017-08-03 DIAGNOSIS — Z992 Dependence on renal dialysis: Secondary | ICD-10-CM | POA: Diagnosis not present

## 2017-08-03 DIAGNOSIS — N2581 Secondary hyperparathyroidism of renal origin: Secondary | ICD-10-CM | POA: Diagnosis not present

## 2017-08-04 DIAGNOSIS — N186 End stage renal disease: Secondary | ICD-10-CM | POA: Diagnosis not present

## 2017-08-04 DIAGNOSIS — Z992 Dependence on renal dialysis: Secondary | ICD-10-CM | POA: Diagnosis not present

## 2017-08-06 DIAGNOSIS — N186 End stage renal disease: Secondary | ICD-10-CM | POA: Diagnosis not present

## 2017-08-06 DIAGNOSIS — N2581 Secondary hyperparathyroidism of renal origin: Secondary | ICD-10-CM | POA: Diagnosis not present

## 2017-08-06 DIAGNOSIS — Z992 Dependence on renal dialysis: Secondary | ICD-10-CM | POA: Diagnosis not present

## 2017-08-06 DIAGNOSIS — D631 Anemia in chronic kidney disease: Secondary | ICD-10-CM | POA: Diagnosis not present

## 2017-08-06 DIAGNOSIS — D472 Monoclonal gammopathy: Secondary | ICD-10-CM | POA: Diagnosis not present

## 2017-08-08 DIAGNOSIS — Z992 Dependence on renal dialysis: Secondary | ICD-10-CM | POA: Diagnosis not present

## 2017-08-08 DIAGNOSIS — D631 Anemia in chronic kidney disease: Secondary | ICD-10-CM | POA: Diagnosis not present

## 2017-08-08 DIAGNOSIS — N2581 Secondary hyperparathyroidism of renal origin: Secondary | ICD-10-CM | POA: Diagnosis not present

## 2017-08-08 DIAGNOSIS — D472 Monoclonal gammopathy: Secondary | ICD-10-CM | POA: Diagnosis not present

## 2017-08-08 DIAGNOSIS — N186 End stage renal disease: Secondary | ICD-10-CM | POA: Diagnosis not present

## 2017-08-11 DIAGNOSIS — Z992 Dependence on renal dialysis: Secondary | ICD-10-CM | POA: Diagnosis not present

## 2017-08-11 DIAGNOSIS — N186 End stage renal disease: Secondary | ICD-10-CM | POA: Diagnosis not present

## 2017-08-11 DIAGNOSIS — D472 Monoclonal gammopathy: Secondary | ICD-10-CM | POA: Diagnosis not present

## 2017-08-11 DIAGNOSIS — D631 Anemia in chronic kidney disease: Secondary | ICD-10-CM | POA: Diagnosis not present

## 2017-08-11 DIAGNOSIS — N2581 Secondary hyperparathyroidism of renal origin: Secondary | ICD-10-CM | POA: Diagnosis not present

## 2017-08-13 DIAGNOSIS — Z992 Dependence on renal dialysis: Secondary | ICD-10-CM | POA: Diagnosis not present

## 2017-08-13 DIAGNOSIS — N186 End stage renal disease: Secondary | ICD-10-CM | POA: Diagnosis not present

## 2017-08-13 DIAGNOSIS — N2581 Secondary hyperparathyroidism of renal origin: Secondary | ICD-10-CM | POA: Diagnosis not present

## 2017-08-13 DIAGNOSIS — D472 Monoclonal gammopathy: Secondary | ICD-10-CM | POA: Diagnosis not present

## 2017-08-13 DIAGNOSIS — D631 Anemia in chronic kidney disease: Secondary | ICD-10-CM | POA: Diagnosis not present

## 2017-08-15 DIAGNOSIS — Z992 Dependence on renal dialysis: Secondary | ICD-10-CM | POA: Diagnosis not present

## 2017-08-15 DIAGNOSIS — D472 Monoclonal gammopathy: Secondary | ICD-10-CM | POA: Diagnosis not present

## 2017-08-15 DIAGNOSIS — N186 End stage renal disease: Secondary | ICD-10-CM | POA: Diagnosis not present

## 2017-08-15 DIAGNOSIS — D631 Anemia in chronic kidney disease: Secondary | ICD-10-CM | POA: Diagnosis not present

## 2017-08-15 DIAGNOSIS — N2581 Secondary hyperparathyroidism of renal origin: Secondary | ICD-10-CM | POA: Diagnosis not present

## 2017-08-18 DIAGNOSIS — N186 End stage renal disease: Secondary | ICD-10-CM | POA: Diagnosis not present

## 2017-08-18 DIAGNOSIS — N2581 Secondary hyperparathyroidism of renal origin: Secondary | ICD-10-CM | POA: Diagnosis not present

## 2017-08-18 DIAGNOSIS — D472 Monoclonal gammopathy: Secondary | ICD-10-CM | POA: Diagnosis not present

## 2017-08-18 DIAGNOSIS — Z992 Dependence on renal dialysis: Secondary | ICD-10-CM | POA: Diagnosis not present

## 2017-08-18 DIAGNOSIS — D631 Anemia in chronic kidney disease: Secondary | ICD-10-CM | POA: Diagnosis not present

## 2017-08-20 DIAGNOSIS — N2581 Secondary hyperparathyroidism of renal origin: Secondary | ICD-10-CM | POA: Diagnosis not present

## 2017-08-20 DIAGNOSIS — D631 Anemia in chronic kidney disease: Secondary | ICD-10-CM | POA: Diagnosis not present

## 2017-08-20 DIAGNOSIS — D472 Monoclonal gammopathy: Secondary | ICD-10-CM | POA: Diagnosis not present

## 2017-08-20 DIAGNOSIS — N186 End stage renal disease: Secondary | ICD-10-CM | POA: Diagnosis not present

## 2017-08-20 DIAGNOSIS — Z992 Dependence on renal dialysis: Secondary | ICD-10-CM | POA: Diagnosis not present

## 2017-08-22 DIAGNOSIS — D472 Monoclonal gammopathy: Secondary | ICD-10-CM | POA: Diagnosis not present

## 2017-08-22 DIAGNOSIS — N186 End stage renal disease: Secondary | ICD-10-CM | POA: Diagnosis not present

## 2017-08-22 DIAGNOSIS — N2581 Secondary hyperparathyroidism of renal origin: Secondary | ICD-10-CM | POA: Diagnosis not present

## 2017-08-22 DIAGNOSIS — Z992 Dependence on renal dialysis: Secondary | ICD-10-CM | POA: Diagnosis not present

## 2017-08-22 DIAGNOSIS — D631 Anemia in chronic kidney disease: Secondary | ICD-10-CM | POA: Diagnosis not present

## 2017-08-25 DIAGNOSIS — N2581 Secondary hyperparathyroidism of renal origin: Secondary | ICD-10-CM | POA: Diagnosis not present

## 2017-08-25 DIAGNOSIS — N186 End stage renal disease: Secondary | ICD-10-CM | POA: Diagnosis not present

## 2017-08-25 DIAGNOSIS — D472 Monoclonal gammopathy: Secondary | ICD-10-CM | POA: Diagnosis not present

## 2017-08-25 DIAGNOSIS — Z992 Dependence on renal dialysis: Secondary | ICD-10-CM | POA: Diagnosis not present

## 2017-08-25 DIAGNOSIS — D631 Anemia in chronic kidney disease: Secondary | ICD-10-CM | POA: Diagnosis not present

## 2017-08-27 DIAGNOSIS — D631 Anemia in chronic kidney disease: Secondary | ICD-10-CM | POA: Diagnosis not present

## 2017-08-27 DIAGNOSIS — N186 End stage renal disease: Secondary | ICD-10-CM | POA: Diagnosis not present

## 2017-08-27 DIAGNOSIS — Z992 Dependence on renal dialysis: Secondary | ICD-10-CM | POA: Diagnosis not present

## 2017-08-27 DIAGNOSIS — D472 Monoclonal gammopathy: Secondary | ICD-10-CM | POA: Diagnosis not present

## 2017-08-27 DIAGNOSIS — N2581 Secondary hyperparathyroidism of renal origin: Secondary | ICD-10-CM | POA: Diagnosis not present

## 2017-08-29 DIAGNOSIS — N186 End stage renal disease: Secondary | ICD-10-CM | POA: Diagnosis not present

## 2017-08-29 DIAGNOSIS — Z992 Dependence on renal dialysis: Secondary | ICD-10-CM | POA: Diagnosis not present

## 2017-08-29 DIAGNOSIS — N2581 Secondary hyperparathyroidism of renal origin: Secondary | ICD-10-CM | POA: Diagnosis not present

## 2017-08-29 DIAGNOSIS — D472 Monoclonal gammopathy: Secondary | ICD-10-CM | POA: Diagnosis not present

## 2017-08-29 DIAGNOSIS — D631 Anemia in chronic kidney disease: Secondary | ICD-10-CM | POA: Diagnosis not present

## 2017-09-01 DIAGNOSIS — N2581 Secondary hyperparathyroidism of renal origin: Secondary | ICD-10-CM | POA: Diagnosis not present

## 2017-09-01 DIAGNOSIS — N186 End stage renal disease: Secondary | ICD-10-CM | POA: Diagnosis not present

## 2017-09-01 DIAGNOSIS — D472 Monoclonal gammopathy: Secondary | ICD-10-CM | POA: Diagnosis not present

## 2017-09-01 DIAGNOSIS — Z992 Dependence on renal dialysis: Secondary | ICD-10-CM | POA: Diagnosis not present

## 2017-09-01 DIAGNOSIS — D631 Anemia in chronic kidney disease: Secondary | ICD-10-CM | POA: Diagnosis not present

## 2017-09-02 DIAGNOSIS — E7800 Pure hypercholesterolemia, unspecified: Secondary | ICD-10-CM | POA: Diagnosis not present

## 2017-09-02 DIAGNOSIS — I10 Essential (primary) hypertension: Secondary | ICD-10-CM | POA: Diagnosis not present

## 2017-09-03 DIAGNOSIS — Z992 Dependence on renal dialysis: Secondary | ICD-10-CM | POA: Diagnosis not present

## 2017-09-03 DIAGNOSIS — N186 End stage renal disease: Secondary | ICD-10-CM | POA: Diagnosis not present

## 2017-09-03 DIAGNOSIS — D472 Monoclonal gammopathy: Secondary | ICD-10-CM | POA: Diagnosis not present

## 2017-09-03 DIAGNOSIS — D631 Anemia in chronic kidney disease: Secondary | ICD-10-CM | POA: Diagnosis not present

## 2017-09-03 DIAGNOSIS — N2581 Secondary hyperparathyroidism of renal origin: Secondary | ICD-10-CM | POA: Diagnosis not present

## 2017-09-04 DIAGNOSIS — N186 End stage renal disease: Secondary | ICD-10-CM | POA: Diagnosis not present

## 2017-09-04 DIAGNOSIS — M25512 Pain in left shoulder: Secondary | ICD-10-CM | POA: Diagnosis not present

## 2017-09-04 DIAGNOSIS — Z6821 Body mass index (BMI) 21.0-21.9, adult: Secondary | ICD-10-CM | POA: Diagnosis not present

## 2017-09-04 DIAGNOSIS — Z7189 Other specified counseling: Secondary | ICD-10-CM | POA: Diagnosis not present

## 2017-09-04 DIAGNOSIS — I4891 Unspecified atrial fibrillation: Secondary | ICD-10-CM | POA: Diagnosis not present

## 2017-09-04 DIAGNOSIS — Z1339 Encounter for screening examination for other mental health and behavioral disorders: Secondary | ICD-10-CM | POA: Diagnosis not present

## 2017-09-04 DIAGNOSIS — C9000 Multiple myeloma not having achieved remission: Secondary | ICD-10-CM | POA: Diagnosis not present

## 2017-09-04 DIAGNOSIS — I10 Essential (primary) hypertension: Secondary | ICD-10-CM | POA: Diagnosis not present

## 2017-09-04 DIAGNOSIS — J449 Chronic obstructive pulmonary disease, unspecified: Secondary | ICD-10-CM | POA: Diagnosis not present

## 2017-09-04 DIAGNOSIS — Z1331 Encounter for screening for depression: Secondary | ICD-10-CM | POA: Diagnosis not present

## 2017-09-04 DIAGNOSIS — Z992 Dependence on renal dialysis: Secondary | ICD-10-CM | POA: Diagnosis not present

## 2017-09-04 DIAGNOSIS — Z299 Encounter for prophylactic measures, unspecified: Secondary | ICD-10-CM | POA: Diagnosis not present

## 2017-09-04 DIAGNOSIS — Z0000 Encounter for general adult medical examination without abnormal findings: Secondary | ICD-10-CM | POA: Diagnosis not present

## 2017-09-05 ENCOUNTER — Other Ambulatory Visit: Payer: Self-pay | Admitting: *Deleted

## 2017-09-05 DIAGNOSIS — Z992 Dependence on renal dialysis: Secondary | ICD-10-CM | POA: Diagnosis not present

## 2017-09-05 DIAGNOSIS — D472 Monoclonal gammopathy: Secondary | ICD-10-CM | POA: Diagnosis not present

## 2017-09-05 DIAGNOSIS — R0602 Shortness of breath: Secondary | ICD-10-CM

## 2017-09-05 DIAGNOSIS — N2581 Secondary hyperparathyroidism of renal origin: Secondary | ICD-10-CM | POA: Diagnosis not present

## 2017-09-05 DIAGNOSIS — N186 End stage renal disease: Secondary | ICD-10-CM | POA: Diagnosis not present

## 2017-09-05 DIAGNOSIS — D631 Anemia in chronic kidney disease: Secondary | ICD-10-CM | POA: Diagnosis not present

## 2017-09-08 DIAGNOSIS — N186 End stage renal disease: Secondary | ICD-10-CM | POA: Diagnosis not present

## 2017-09-08 DIAGNOSIS — D472 Monoclonal gammopathy: Secondary | ICD-10-CM | POA: Diagnosis not present

## 2017-09-08 DIAGNOSIS — D631 Anemia in chronic kidney disease: Secondary | ICD-10-CM | POA: Diagnosis not present

## 2017-09-08 DIAGNOSIS — N2581 Secondary hyperparathyroidism of renal origin: Secondary | ICD-10-CM | POA: Diagnosis not present

## 2017-09-08 DIAGNOSIS — Z992 Dependence on renal dialysis: Secondary | ICD-10-CM | POA: Diagnosis not present

## 2017-09-10 DIAGNOSIS — D631 Anemia in chronic kidney disease: Secondary | ICD-10-CM | POA: Diagnosis not present

## 2017-09-10 DIAGNOSIS — N186 End stage renal disease: Secondary | ICD-10-CM | POA: Diagnosis not present

## 2017-09-10 DIAGNOSIS — N2581 Secondary hyperparathyroidism of renal origin: Secondary | ICD-10-CM | POA: Diagnosis not present

## 2017-09-10 DIAGNOSIS — Z992 Dependence on renal dialysis: Secondary | ICD-10-CM | POA: Diagnosis not present

## 2017-09-10 DIAGNOSIS — D472 Monoclonal gammopathy: Secondary | ICD-10-CM | POA: Diagnosis not present

## 2017-09-12 DIAGNOSIS — N186 End stage renal disease: Secondary | ICD-10-CM | POA: Diagnosis not present

## 2017-09-12 DIAGNOSIS — N2581 Secondary hyperparathyroidism of renal origin: Secondary | ICD-10-CM | POA: Diagnosis not present

## 2017-09-12 DIAGNOSIS — Z992 Dependence on renal dialysis: Secondary | ICD-10-CM | POA: Diagnosis not present

## 2017-09-12 DIAGNOSIS — D631 Anemia in chronic kidney disease: Secondary | ICD-10-CM | POA: Diagnosis not present

## 2017-09-12 DIAGNOSIS — D472 Monoclonal gammopathy: Secondary | ICD-10-CM | POA: Diagnosis not present

## 2017-09-15 DIAGNOSIS — Z992 Dependence on renal dialysis: Secondary | ICD-10-CM | POA: Diagnosis not present

## 2017-09-15 DIAGNOSIS — N2581 Secondary hyperparathyroidism of renal origin: Secondary | ICD-10-CM | POA: Diagnosis not present

## 2017-09-15 DIAGNOSIS — D472 Monoclonal gammopathy: Secondary | ICD-10-CM | POA: Diagnosis not present

## 2017-09-15 DIAGNOSIS — D631 Anemia in chronic kidney disease: Secondary | ICD-10-CM | POA: Diagnosis not present

## 2017-09-15 DIAGNOSIS — N186 End stage renal disease: Secondary | ICD-10-CM | POA: Diagnosis not present

## 2017-09-17 DIAGNOSIS — Z992 Dependence on renal dialysis: Secondary | ICD-10-CM | POA: Diagnosis not present

## 2017-09-17 DIAGNOSIS — N2581 Secondary hyperparathyroidism of renal origin: Secondary | ICD-10-CM | POA: Diagnosis not present

## 2017-09-17 DIAGNOSIS — D631 Anemia in chronic kidney disease: Secondary | ICD-10-CM | POA: Diagnosis not present

## 2017-09-17 DIAGNOSIS — N186 End stage renal disease: Secondary | ICD-10-CM | POA: Diagnosis not present

## 2017-09-17 DIAGNOSIS — D472 Monoclonal gammopathy: Secondary | ICD-10-CM | POA: Diagnosis not present

## 2017-09-19 DIAGNOSIS — N186 End stage renal disease: Secondary | ICD-10-CM | POA: Diagnosis not present

## 2017-09-19 DIAGNOSIS — N2581 Secondary hyperparathyroidism of renal origin: Secondary | ICD-10-CM | POA: Diagnosis not present

## 2017-09-19 DIAGNOSIS — Z992 Dependence on renal dialysis: Secondary | ICD-10-CM | POA: Diagnosis not present

## 2017-09-19 DIAGNOSIS — D472 Monoclonal gammopathy: Secondary | ICD-10-CM | POA: Diagnosis not present

## 2017-09-19 DIAGNOSIS — D631 Anemia in chronic kidney disease: Secondary | ICD-10-CM | POA: Diagnosis not present

## 2017-09-22 DIAGNOSIS — N2581 Secondary hyperparathyroidism of renal origin: Secondary | ICD-10-CM | POA: Diagnosis not present

## 2017-09-22 DIAGNOSIS — Z992 Dependence on renal dialysis: Secondary | ICD-10-CM | POA: Diagnosis not present

## 2017-09-22 DIAGNOSIS — D631 Anemia in chronic kidney disease: Secondary | ICD-10-CM | POA: Diagnosis not present

## 2017-09-22 DIAGNOSIS — D472 Monoclonal gammopathy: Secondary | ICD-10-CM | POA: Diagnosis not present

## 2017-09-22 DIAGNOSIS — N186 End stage renal disease: Secondary | ICD-10-CM | POA: Diagnosis not present

## 2017-09-24 DIAGNOSIS — D631 Anemia in chronic kidney disease: Secondary | ICD-10-CM | POA: Diagnosis not present

## 2017-09-24 DIAGNOSIS — D472 Monoclonal gammopathy: Secondary | ICD-10-CM | POA: Diagnosis not present

## 2017-09-24 DIAGNOSIS — N2581 Secondary hyperparathyroidism of renal origin: Secondary | ICD-10-CM | POA: Diagnosis not present

## 2017-09-24 DIAGNOSIS — N186 End stage renal disease: Secondary | ICD-10-CM | POA: Diagnosis not present

## 2017-09-24 DIAGNOSIS — Z992 Dependence on renal dialysis: Secondary | ICD-10-CM | POA: Diagnosis not present

## 2017-09-26 DIAGNOSIS — D631 Anemia in chronic kidney disease: Secondary | ICD-10-CM | POA: Diagnosis not present

## 2017-09-26 DIAGNOSIS — N186 End stage renal disease: Secondary | ICD-10-CM | POA: Diagnosis not present

## 2017-09-26 DIAGNOSIS — Z992 Dependence on renal dialysis: Secondary | ICD-10-CM | POA: Diagnosis not present

## 2017-09-26 DIAGNOSIS — N2581 Secondary hyperparathyroidism of renal origin: Secondary | ICD-10-CM | POA: Diagnosis not present

## 2017-09-26 DIAGNOSIS — D472 Monoclonal gammopathy: Secondary | ICD-10-CM | POA: Diagnosis not present

## 2017-09-29 DIAGNOSIS — D472 Monoclonal gammopathy: Secondary | ICD-10-CM | POA: Diagnosis not present

## 2017-09-29 DIAGNOSIS — Z992 Dependence on renal dialysis: Secondary | ICD-10-CM | POA: Diagnosis not present

## 2017-09-29 DIAGNOSIS — D631 Anemia in chronic kidney disease: Secondary | ICD-10-CM | POA: Diagnosis not present

## 2017-09-29 DIAGNOSIS — N186 End stage renal disease: Secondary | ICD-10-CM | POA: Diagnosis not present

## 2017-09-29 DIAGNOSIS — N2581 Secondary hyperparathyroidism of renal origin: Secondary | ICD-10-CM | POA: Diagnosis not present

## 2017-09-30 DIAGNOSIS — J449 Chronic obstructive pulmonary disease, unspecified: Secondary | ICD-10-CM | POA: Diagnosis not present

## 2017-09-30 DIAGNOSIS — Z299 Encounter for prophylactic measures, unspecified: Secondary | ICD-10-CM | POA: Diagnosis not present

## 2017-09-30 DIAGNOSIS — M25512 Pain in left shoulder: Secondary | ICD-10-CM | POA: Diagnosis not present

## 2017-09-30 DIAGNOSIS — Z992 Dependence on renal dialysis: Secondary | ICD-10-CM | POA: Diagnosis not present

## 2017-09-30 DIAGNOSIS — C9000 Multiple myeloma not having achieved remission: Secondary | ICD-10-CM | POA: Diagnosis not present

## 2017-09-30 DIAGNOSIS — N186 End stage renal disease: Secondary | ICD-10-CM | POA: Diagnosis not present

## 2017-09-30 DIAGNOSIS — I4891 Unspecified atrial fibrillation: Secondary | ICD-10-CM | POA: Diagnosis not present

## 2017-09-30 DIAGNOSIS — I429 Cardiomyopathy, unspecified: Secondary | ICD-10-CM | POA: Diagnosis not present

## 2017-09-30 DIAGNOSIS — K529 Noninfective gastroenteritis and colitis, unspecified: Secondary | ICD-10-CM | POA: Diagnosis not present

## 2017-09-30 DIAGNOSIS — Z6821 Body mass index (BMI) 21.0-21.9, adult: Secondary | ICD-10-CM | POA: Diagnosis not present

## 2017-10-01 DIAGNOSIS — N186 End stage renal disease: Secondary | ICD-10-CM | POA: Diagnosis not present

## 2017-10-01 DIAGNOSIS — Z992 Dependence on renal dialysis: Secondary | ICD-10-CM | POA: Diagnosis not present

## 2017-10-01 DIAGNOSIS — D631 Anemia in chronic kidney disease: Secondary | ICD-10-CM | POA: Diagnosis not present

## 2017-10-01 DIAGNOSIS — D472 Monoclonal gammopathy: Secondary | ICD-10-CM | POA: Diagnosis not present

## 2017-10-01 DIAGNOSIS — N2581 Secondary hyperparathyroidism of renal origin: Secondary | ICD-10-CM | POA: Diagnosis not present

## 2017-10-02 DIAGNOSIS — Z992 Dependence on renal dialysis: Secondary | ICD-10-CM | POA: Diagnosis not present

## 2017-10-02 DIAGNOSIS — N186 End stage renal disease: Secondary | ICD-10-CM | POA: Diagnosis not present

## 2017-10-03 DIAGNOSIS — N2581 Secondary hyperparathyroidism of renal origin: Secondary | ICD-10-CM | POA: Diagnosis not present

## 2017-10-03 DIAGNOSIS — D472 Monoclonal gammopathy: Secondary | ICD-10-CM | POA: Diagnosis not present

## 2017-10-03 DIAGNOSIS — D509 Iron deficiency anemia, unspecified: Secondary | ICD-10-CM | POA: Diagnosis not present

## 2017-10-03 DIAGNOSIS — N186 End stage renal disease: Secondary | ICD-10-CM | POA: Diagnosis not present

## 2017-10-03 DIAGNOSIS — Z992 Dependence on renal dialysis: Secondary | ICD-10-CM | POA: Diagnosis not present

## 2017-10-03 DIAGNOSIS — D631 Anemia in chronic kidney disease: Secondary | ICD-10-CM | POA: Diagnosis not present

## 2017-10-06 DIAGNOSIS — N2581 Secondary hyperparathyroidism of renal origin: Secondary | ICD-10-CM | POA: Diagnosis not present

## 2017-10-06 DIAGNOSIS — N186 End stage renal disease: Secondary | ICD-10-CM | POA: Diagnosis not present

## 2017-10-06 DIAGNOSIS — Z992 Dependence on renal dialysis: Secondary | ICD-10-CM | POA: Diagnosis not present

## 2017-10-06 DIAGNOSIS — D472 Monoclonal gammopathy: Secondary | ICD-10-CM | POA: Diagnosis not present

## 2017-10-06 DIAGNOSIS — D631 Anemia in chronic kidney disease: Secondary | ICD-10-CM | POA: Diagnosis not present

## 2017-10-06 DIAGNOSIS — D509 Iron deficiency anemia, unspecified: Secondary | ICD-10-CM | POA: Diagnosis not present

## 2017-10-08 DIAGNOSIS — D631 Anemia in chronic kidney disease: Secondary | ICD-10-CM | POA: Diagnosis not present

## 2017-10-08 DIAGNOSIS — N186 End stage renal disease: Secondary | ICD-10-CM | POA: Diagnosis not present

## 2017-10-08 DIAGNOSIS — N2581 Secondary hyperparathyroidism of renal origin: Secondary | ICD-10-CM | POA: Diagnosis not present

## 2017-10-08 DIAGNOSIS — D509 Iron deficiency anemia, unspecified: Secondary | ICD-10-CM | POA: Diagnosis not present

## 2017-10-08 DIAGNOSIS — D472 Monoclonal gammopathy: Secondary | ICD-10-CM | POA: Diagnosis not present

## 2017-10-08 DIAGNOSIS — Z992 Dependence on renal dialysis: Secondary | ICD-10-CM | POA: Diagnosis not present

## 2017-10-10 DIAGNOSIS — N2581 Secondary hyperparathyroidism of renal origin: Secondary | ICD-10-CM | POA: Diagnosis not present

## 2017-10-10 DIAGNOSIS — N186 End stage renal disease: Secondary | ICD-10-CM | POA: Diagnosis not present

## 2017-10-10 DIAGNOSIS — D631 Anemia in chronic kidney disease: Secondary | ICD-10-CM | POA: Diagnosis not present

## 2017-10-10 DIAGNOSIS — D509 Iron deficiency anemia, unspecified: Secondary | ICD-10-CM | POA: Diagnosis not present

## 2017-10-10 DIAGNOSIS — Z992 Dependence on renal dialysis: Secondary | ICD-10-CM | POA: Diagnosis not present

## 2017-10-10 DIAGNOSIS — D472 Monoclonal gammopathy: Secondary | ICD-10-CM | POA: Diagnosis not present

## 2017-10-13 DIAGNOSIS — D631 Anemia in chronic kidney disease: Secondary | ICD-10-CM | POA: Diagnosis not present

## 2017-10-13 DIAGNOSIS — N2581 Secondary hyperparathyroidism of renal origin: Secondary | ICD-10-CM | POA: Diagnosis not present

## 2017-10-13 DIAGNOSIS — D509 Iron deficiency anemia, unspecified: Secondary | ICD-10-CM | POA: Diagnosis not present

## 2017-10-13 DIAGNOSIS — Z992 Dependence on renal dialysis: Secondary | ICD-10-CM | POA: Diagnosis not present

## 2017-10-13 DIAGNOSIS — N186 End stage renal disease: Secondary | ICD-10-CM | POA: Diagnosis not present

## 2017-10-13 DIAGNOSIS — D472 Monoclonal gammopathy: Secondary | ICD-10-CM | POA: Diagnosis not present

## 2017-10-15 ENCOUNTER — Other Ambulatory Visit: Payer: Medicare Other

## 2017-10-15 DIAGNOSIS — Z992 Dependence on renal dialysis: Secondary | ICD-10-CM | POA: Diagnosis not present

## 2017-10-15 DIAGNOSIS — D631 Anemia in chronic kidney disease: Secondary | ICD-10-CM | POA: Diagnosis not present

## 2017-10-15 DIAGNOSIS — N186 End stage renal disease: Secondary | ICD-10-CM | POA: Diagnosis not present

## 2017-10-15 DIAGNOSIS — N2581 Secondary hyperparathyroidism of renal origin: Secondary | ICD-10-CM | POA: Diagnosis not present

## 2017-10-15 DIAGNOSIS — D472 Monoclonal gammopathy: Secondary | ICD-10-CM | POA: Diagnosis not present

## 2017-10-15 DIAGNOSIS — D509 Iron deficiency anemia, unspecified: Secondary | ICD-10-CM | POA: Diagnosis not present

## 2017-10-17 DIAGNOSIS — Z992 Dependence on renal dialysis: Secondary | ICD-10-CM | POA: Diagnosis not present

## 2017-10-17 DIAGNOSIS — N2581 Secondary hyperparathyroidism of renal origin: Secondary | ICD-10-CM | POA: Diagnosis not present

## 2017-10-17 DIAGNOSIS — N186 End stage renal disease: Secondary | ICD-10-CM | POA: Diagnosis not present

## 2017-10-17 DIAGNOSIS — D472 Monoclonal gammopathy: Secondary | ICD-10-CM | POA: Diagnosis not present

## 2017-10-17 DIAGNOSIS — D631 Anemia in chronic kidney disease: Secondary | ICD-10-CM | POA: Diagnosis not present

## 2017-10-17 DIAGNOSIS — D509 Iron deficiency anemia, unspecified: Secondary | ICD-10-CM | POA: Diagnosis not present

## 2017-10-20 DIAGNOSIS — N2581 Secondary hyperparathyroidism of renal origin: Secondary | ICD-10-CM | POA: Diagnosis not present

## 2017-10-20 DIAGNOSIS — N186 End stage renal disease: Secondary | ICD-10-CM | POA: Diagnosis not present

## 2017-10-20 DIAGNOSIS — D472 Monoclonal gammopathy: Secondary | ICD-10-CM | POA: Diagnosis not present

## 2017-10-20 DIAGNOSIS — Z992 Dependence on renal dialysis: Secondary | ICD-10-CM | POA: Diagnosis not present

## 2017-10-20 DIAGNOSIS — D631 Anemia in chronic kidney disease: Secondary | ICD-10-CM | POA: Diagnosis not present

## 2017-10-20 DIAGNOSIS — D509 Iron deficiency anemia, unspecified: Secondary | ICD-10-CM | POA: Diagnosis not present

## 2017-10-22 ENCOUNTER — Ambulatory Visit: Payer: Medicare Other | Admitting: Cardiovascular Disease

## 2017-10-22 DIAGNOSIS — Z992 Dependence on renal dialysis: Secondary | ICD-10-CM | POA: Diagnosis not present

## 2017-10-22 DIAGNOSIS — D631 Anemia in chronic kidney disease: Secondary | ICD-10-CM | POA: Diagnosis not present

## 2017-10-22 DIAGNOSIS — N2581 Secondary hyperparathyroidism of renal origin: Secondary | ICD-10-CM | POA: Diagnosis not present

## 2017-10-22 DIAGNOSIS — N186 End stage renal disease: Secondary | ICD-10-CM | POA: Diagnosis not present

## 2017-10-22 DIAGNOSIS — D472 Monoclonal gammopathy: Secondary | ICD-10-CM | POA: Diagnosis not present

## 2017-10-22 DIAGNOSIS — D509 Iron deficiency anemia, unspecified: Secondary | ICD-10-CM | POA: Diagnosis not present

## 2017-10-24 DIAGNOSIS — D631 Anemia in chronic kidney disease: Secondary | ICD-10-CM | POA: Diagnosis not present

## 2017-10-24 DIAGNOSIS — D509 Iron deficiency anemia, unspecified: Secondary | ICD-10-CM | POA: Diagnosis not present

## 2017-10-24 DIAGNOSIS — N186 End stage renal disease: Secondary | ICD-10-CM | POA: Diagnosis not present

## 2017-10-24 DIAGNOSIS — Z992 Dependence on renal dialysis: Secondary | ICD-10-CM | POA: Diagnosis not present

## 2017-10-24 DIAGNOSIS — N2581 Secondary hyperparathyroidism of renal origin: Secondary | ICD-10-CM | POA: Diagnosis not present

## 2017-10-24 DIAGNOSIS — D472 Monoclonal gammopathy: Secondary | ICD-10-CM | POA: Diagnosis not present

## 2017-10-27 DIAGNOSIS — Z992 Dependence on renal dialysis: Secondary | ICD-10-CM | POA: Diagnosis not present

## 2017-10-27 DIAGNOSIS — N2581 Secondary hyperparathyroidism of renal origin: Secondary | ICD-10-CM | POA: Diagnosis not present

## 2017-10-27 DIAGNOSIS — N186 End stage renal disease: Secondary | ICD-10-CM | POA: Diagnosis not present

## 2017-10-27 DIAGNOSIS — Z79899 Other long term (current) drug therapy: Secondary | ICD-10-CM | POA: Diagnosis not present

## 2017-10-27 DIAGNOSIS — D509 Iron deficiency anemia, unspecified: Secondary | ICD-10-CM | POA: Diagnosis not present

## 2017-10-27 DIAGNOSIS — D472 Monoclonal gammopathy: Secondary | ICD-10-CM | POA: Diagnosis not present

## 2017-10-27 DIAGNOSIS — D631 Anemia in chronic kidney disease: Secondary | ICD-10-CM | POA: Diagnosis not present

## 2017-10-27 DIAGNOSIS — E785 Hyperlipidemia, unspecified: Secondary | ICD-10-CM | POA: Diagnosis not present

## 2017-10-29 DIAGNOSIS — D472 Monoclonal gammopathy: Secondary | ICD-10-CM | POA: Diagnosis not present

## 2017-10-29 DIAGNOSIS — Z992 Dependence on renal dialysis: Secondary | ICD-10-CM | POA: Diagnosis not present

## 2017-10-29 DIAGNOSIS — D631 Anemia in chronic kidney disease: Secondary | ICD-10-CM | POA: Diagnosis not present

## 2017-10-29 DIAGNOSIS — D509 Iron deficiency anemia, unspecified: Secondary | ICD-10-CM | POA: Diagnosis not present

## 2017-10-29 DIAGNOSIS — N2581 Secondary hyperparathyroidism of renal origin: Secondary | ICD-10-CM | POA: Diagnosis not present

## 2017-10-29 DIAGNOSIS — N186 End stage renal disease: Secondary | ICD-10-CM | POA: Diagnosis not present

## 2017-10-30 ENCOUNTER — Ambulatory Visit (INDEPENDENT_AMBULATORY_CARE_PROVIDER_SITE_OTHER): Payer: Medicare Other

## 2017-10-30 ENCOUNTER — Other Ambulatory Visit: Payer: Self-pay

## 2017-10-30 DIAGNOSIS — R0602 Shortness of breath: Secondary | ICD-10-CM | POA: Diagnosis not present

## 2017-10-31 DIAGNOSIS — D509 Iron deficiency anemia, unspecified: Secondary | ICD-10-CM | POA: Diagnosis not present

## 2017-10-31 DIAGNOSIS — D472 Monoclonal gammopathy: Secondary | ICD-10-CM | POA: Diagnosis not present

## 2017-10-31 DIAGNOSIS — Z992 Dependence on renal dialysis: Secondary | ICD-10-CM | POA: Diagnosis not present

## 2017-10-31 DIAGNOSIS — N186 End stage renal disease: Secondary | ICD-10-CM | POA: Diagnosis not present

## 2017-10-31 DIAGNOSIS — D631 Anemia in chronic kidney disease: Secondary | ICD-10-CM | POA: Diagnosis not present

## 2017-10-31 DIAGNOSIS — N2581 Secondary hyperparathyroidism of renal origin: Secondary | ICD-10-CM | POA: Diagnosis not present

## 2017-11-02 DIAGNOSIS — Z992 Dependence on renal dialysis: Secondary | ICD-10-CM | POA: Diagnosis not present

## 2017-11-02 DIAGNOSIS — N186 End stage renal disease: Secondary | ICD-10-CM | POA: Diagnosis not present

## 2017-11-03 DIAGNOSIS — D631 Anemia in chronic kidney disease: Secondary | ICD-10-CM | POA: Diagnosis not present

## 2017-11-03 DIAGNOSIS — D472 Monoclonal gammopathy: Secondary | ICD-10-CM | POA: Diagnosis not present

## 2017-11-03 DIAGNOSIS — Z992 Dependence on renal dialysis: Secondary | ICD-10-CM | POA: Diagnosis not present

## 2017-11-03 DIAGNOSIS — D509 Iron deficiency anemia, unspecified: Secondary | ICD-10-CM | POA: Diagnosis not present

## 2017-11-03 DIAGNOSIS — N186 End stage renal disease: Secondary | ICD-10-CM | POA: Diagnosis not present

## 2017-11-03 DIAGNOSIS — N2581 Secondary hyperparathyroidism of renal origin: Secondary | ICD-10-CM | POA: Diagnosis not present

## 2017-11-05 DIAGNOSIS — Z992 Dependence on renal dialysis: Secondary | ICD-10-CM | POA: Diagnosis not present

## 2017-11-05 DIAGNOSIS — D631 Anemia in chronic kidney disease: Secondary | ICD-10-CM | POA: Diagnosis not present

## 2017-11-05 DIAGNOSIS — N2581 Secondary hyperparathyroidism of renal origin: Secondary | ICD-10-CM | POA: Diagnosis not present

## 2017-11-05 DIAGNOSIS — D509 Iron deficiency anemia, unspecified: Secondary | ICD-10-CM | POA: Diagnosis not present

## 2017-11-05 DIAGNOSIS — D472 Monoclonal gammopathy: Secondary | ICD-10-CM | POA: Diagnosis not present

## 2017-11-05 DIAGNOSIS — N186 End stage renal disease: Secondary | ICD-10-CM | POA: Diagnosis not present

## 2017-11-06 ENCOUNTER — Ambulatory Visit (INDEPENDENT_AMBULATORY_CARE_PROVIDER_SITE_OTHER): Payer: Medicare Other | Admitting: Cardiovascular Disease

## 2017-11-06 ENCOUNTER — Encounter: Payer: Self-pay | Admitting: Cardiovascular Disease

## 2017-11-06 VITALS — BP 88/52 | HR 97 | Ht 74.0 in | Wt 146.0 lb

## 2017-11-06 DIAGNOSIS — N186 End stage renal disease: Secondary | ICD-10-CM | POA: Diagnosis not present

## 2017-11-06 DIAGNOSIS — I482 Chronic atrial fibrillation: Secondary | ICD-10-CM | POA: Diagnosis not present

## 2017-11-06 DIAGNOSIS — I350 Nonrheumatic aortic (valve) stenosis: Secondary | ICD-10-CM | POA: Diagnosis not present

## 2017-11-06 DIAGNOSIS — I5032 Chronic diastolic (congestive) heart failure: Secondary | ICD-10-CM | POA: Diagnosis not present

## 2017-11-06 DIAGNOSIS — Z992 Dependence on renal dialysis: Secondary | ICD-10-CM | POA: Diagnosis not present

## 2017-11-06 DIAGNOSIS — I10 Essential (primary) hypertension: Secondary | ICD-10-CM | POA: Diagnosis not present

## 2017-11-06 DIAGNOSIS — I4821 Permanent atrial fibrillation: Secondary | ICD-10-CM

## 2017-11-06 NOTE — Patient Instructions (Signed)
Medication Instructions:  Your physician recommends that you continue on your current medications as directed. Please refer to the Current Medication list given to you today.  Labwork: NONE  Testing/Procedures: NONE  Follow-Up: Your physician wants you to follow-up in: 6 MONTHS WITH DR. KONESWARAN. You will receive a reminder letter in the mail two months in advance. If you don't receive a letter, please call our office to schedule the follow-up appointment.  Any Other Special Instructions Will Be Listed Below (If Applicable).  If you need a refill on your cardiac medications before your next appointment, please call your pharmacy. 

## 2017-11-06 NOTE — Progress Notes (Signed)
SUBJECTIVE: The patient presents for routine follow-up.  He has aortic stenosis and chronic diastolic heart failure.  He saw Dr. Burt Knack on 05/01/17.  While he agreed that the patient has valve leaflet calcification and restriction, Doppler data did not suggest severe aortic stenosis given a mean gradient of only 17 mmHg in the setting of normal left ventricular systolic function.  He did note that the patient had a small left ventricle with hyperdynamic function, a scenario sometimes seen in patients with severe, paradoxical low flow low gradient aortic stenosis.  Due to the patient's multiple comorbidities including marked weakness, nearly wheelchair-bound, multiple myeloma not tolerating therapy, and long-standing end-stage renal disease on dialysis for at least 20 years, he was felt to be a poor candidate for TAVR.  Dr. Burt Knack felt that the potential for harm and putting him through the workup and ultimate surgery probably outweigh the potential benefit.  He discussed this with the patient and his son.  A follow-up echocardiogram performed on 10/30/17 demonstrated vigorous left ventricular systolic function, LVEF 69-48%, moderately calcified aortic leaflets with severe reduction of cusp separation suggestive of severe aortic stenosis.  Mean gradient remains 17 mmHg.  There was also mild to moderate mitral regurgitation and severe biatrial dilatation.  There was mild tricuspid regurgitation with mildly increased pulmonary pressures, 37 mmHg.  He is feeling much better since his last visit with me.  He denies chest pain, palpitations, dizziness, leg swelling, and shortness of breath.  His appetite is good.  He is here with his son.  We talked about the NCAA tournament as he enjoys watching college basketball. He was a big fan of [employer] [place].     Review of Systems: As per "subjective", otherwise negative.  Allergies  Allergen Reactions  . Other     Hydrocodone cough syrup makes him itch     Current Outpatient Medications  Medication Sig Dispense Refill  . cinacalcet (SENSIPAR) 30 MG tablet Take 30 mg by mouth daily.    . dorzolamide-timolol (COSOPT) 22.3-6.8 MG/ML ophthalmic solution Place 1 drop into both eyes at bedtime.    . folic acid (FOLVITE) 1 MG tablet Take 1 mg by mouth daily.    [name] lanthanum (FOSRENOL) 500 MG chewable tablet Chew 1,000 mg by mouth 3 (three) times daily with meals. And 1 with snacks    . midodrine (PROAMATINE) 10 MG tablet Take 5 mg by mouth daily.    . mirtazapine (REMERON) 7.5 MG tablet Take 7.5 mg by mouth at bedtime.    . simvastatin (ZOCOR) 20 MG tablet Take 20 mg by mouth daily with supper.       No current facility-administered medications for this visit.     Past Medical History:  Diagnosis Date  . Atrial fibrillation (HCC)    Permanent, Not on Coumadin because of history of lower GI bleed  . Diastolic dysfunction    Restrictive  diastolic filling pattern echo,  February, 2013  . Dyslipidemia   . Ejection fraction    EF 60-65%, echo, 2013  . ESRD (end stage renal disease) ([place])    Dialysis, fistula in left arm  . Hypertension   . Lower gastrointestinal bleed   . LVH (left ventricular hypertrophy)   . 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 Mitral regurgitation    Mild by echo, 2013  . Multiple myeloma ([place]) 04/30/2016  . Pancytopenia ([place])    Dr Ignacia Bayley  . Pulmonary hypertension (HCC)    Severe,  PA pressure 65-70 mm of mercury, echo, 2013  . Tobacco abuse     Past Surgical History:  Procedure Laterality Date  . A/V FISTULAGRAM Left 04/03/2017   Procedure: A/V Fistulagram;  Surgeon: Conrad Ripley, MD;  Location: [facility];  Service: Cardiovascular;  Laterality: Left;  . AV FISTULA PLACEMENT Left 1992   Left arm   . 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 Marital status: Widowed    Spouse name: Not on file  . Number of children: Not on file  . Years of education: Not on file  . Highest education level: Not on file  Occupational History  . Not on file  Social Needs  . Financial resource strain: Not on file  . Food insecurity:    Worry: Not on file    Inability: Not on file  . Transportation needs:    Medical: Not on file    Non-medical: Not on file  Tobacco Use  . Smoking status: Never Smoker  . Smokeless tobacco: Never Used  Substance and Sexual Activity  . Alcohol use: No    Alcohol/week: 0.0 oz  . Drug use: No  . Sexual activity: Not on file  Lifestyle  . Physical activity:    Days per week: Not on file    Minutes per session: Not on file  . Stress: Not on file  Relationships  . Social connections:    Talks on phone: Not on file    Gets together: Not on file    Attends religious service: Not on file    Active member of club or organization: Not on file    Attends meetings of clubs or organizations: Not on file    Relationship status: Not on file  . Intimate partner violence:    Fear of current or ex partner: Not on file    Emotionally abused: Not on file    Physically abused: Not on file    Forced sexual activity: Not on file  Other Topics Concern  . Not on file  Social History Narrative   Lives alone in a one story home.  Has 2 sons.     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     Education high school.      Vitals:   11/06/17 1130  BP: (!) 88/52  Pulse: 97  SpO2: 93%  Weight: 146 lb (66.2 kg)  Height: '6\' 2"'  (1.88 m)    Wt Readings from Last 3 Encounters:  11/06/17 146 lb (66.2 kg)  04/21/17 145 lb (65.8 kg)  04/03/17 140 lb (63.5 kg)     PHYSICAL EXAM General: NAD,  chronically ill-appearing HEENT: Normal. Neck: No JVD, no thyromegaly. Lungs: Clear to auscultation bilaterally with normal respiratory effort. CV: Regular rate and irregular rhythm, normal S1/S2, no S3, 3/6 holosystolic murmur heard along left sternal border. No pretibial or periankle edema. Left arm AV fistula.  Abdomen: Soft, nontender, no distention.  Neurologic: Alert and oriented.  Psych: Normal affect. Skin: Normal. Musculoskeletal: No gross deformities.    ECG: Most recent ECG reviewed.   Labs: Lab Results  Component Value  Date/Time   K 4.4 04/24/2017 11:38 AM   BUN 50 (H) 04/24/2017 11:38 AM   CREATININE 5.28 (H) 04/24/2017 11:38 AM   ALT 14 (L) 12/31/2016 01:40 PM   HGB 9.9 (L) 04/24/2017 11:38 AM     Lipids: No results found for: LDLCALC, LDLDIRECT, CHOL, TRIG, HDL     ASSESSMENT AND PLAN: 1.  Severe, paradoxical low flow with low gradient aortic stenosis: Symptomatically stable.  This will be managed conservatively.  Please see discussion above.  2.  Chronic diastolic heart failure: Medically stable.  Volume management as per hemodialysis.  3.  Permanent atrial fibrillation: Symptomatically stable. Heart rate is controlled without AV nodal blocking agents indicative of conduction system disease. Reportedly unable to be anticoagulated due to h/o GI bleeding. He said this was several years ago. I previously talked to him about potentially instituting warfarin. He said he wanted to think about it. He would need to see GI first.  4.  End-stage renal disease on dialysis.  5.  Chronic hypertension: Blood pressure is low.  He is actually now on midodrine.  He is asymptomatic from the standpoint.   Disposition: Follow up 6 months   Kate Sable, M.D., F.A.C.C.

## 2017-11-07 DIAGNOSIS — N186 End stage renal disease: Secondary | ICD-10-CM | POA: Diagnosis not present

## 2017-11-07 DIAGNOSIS — D631 Anemia in chronic kidney disease: Secondary | ICD-10-CM | POA: Diagnosis not present

## 2017-11-07 DIAGNOSIS — N2581 Secondary hyperparathyroidism of renal origin: Secondary | ICD-10-CM | POA: Diagnosis not present

## 2017-11-07 DIAGNOSIS — Z992 Dependence on renal dialysis: Secondary | ICD-10-CM | POA: Diagnosis not present

## 2017-11-07 DIAGNOSIS — D509 Iron deficiency anemia, unspecified: Secondary | ICD-10-CM | POA: Diagnosis not present

## 2017-11-07 DIAGNOSIS — D472 Monoclonal gammopathy: Secondary | ICD-10-CM | POA: Diagnosis not present

## 2017-11-10 DIAGNOSIS — D472 Monoclonal gammopathy: Secondary | ICD-10-CM | POA: Diagnosis not present

## 2017-11-10 DIAGNOSIS — N2581 Secondary hyperparathyroidism of renal origin: Secondary | ICD-10-CM | POA: Diagnosis not present

## 2017-11-10 DIAGNOSIS — Z992 Dependence on renal dialysis: Secondary | ICD-10-CM | POA: Diagnosis not present

## 2017-11-10 DIAGNOSIS — N186 End stage renal disease: Secondary | ICD-10-CM | POA: Diagnosis not present

## 2017-11-10 DIAGNOSIS — D631 Anemia in chronic kidney disease: Secondary | ICD-10-CM | POA: Diagnosis not present

## 2017-11-10 DIAGNOSIS — D509 Iron deficiency anemia, unspecified: Secondary | ICD-10-CM | POA: Diagnosis not present

## 2017-11-12 DIAGNOSIS — D509 Iron deficiency anemia, unspecified: Secondary | ICD-10-CM | POA: Diagnosis not present

## 2017-11-12 DIAGNOSIS — D472 Monoclonal gammopathy: Secondary | ICD-10-CM | POA: Diagnosis not present

## 2017-11-12 DIAGNOSIS — N2581 Secondary hyperparathyroidism of renal origin: Secondary | ICD-10-CM | POA: Diagnosis not present

## 2017-11-12 DIAGNOSIS — Z992 Dependence on renal dialysis: Secondary | ICD-10-CM | POA: Diagnosis not present

## 2017-11-12 DIAGNOSIS — N186 End stage renal disease: Secondary | ICD-10-CM | POA: Diagnosis not present

## 2017-11-12 DIAGNOSIS — D631 Anemia in chronic kidney disease: Secondary | ICD-10-CM | POA: Diagnosis not present

## 2017-11-14 DIAGNOSIS — D631 Anemia in chronic kidney disease: Secondary | ICD-10-CM | POA: Diagnosis not present

## 2017-11-14 DIAGNOSIS — D472 Monoclonal gammopathy: Secondary | ICD-10-CM | POA: Diagnosis not present

## 2017-11-14 DIAGNOSIS — N186 End stage renal disease: Secondary | ICD-10-CM | POA: Diagnosis not present

## 2017-11-14 DIAGNOSIS — N2581 Secondary hyperparathyroidism of renal origin: Secondary | ICD-10-CM | POA: Diagnosis not present

## 2017-11-14 DIAGNOSIS — D509 Iron deficiency anemia, unspecified: Secondary | ICD-10-CM | POA: Diagnosis not present

## 2017-11-14 DIAGNOSIS — Z992 Dependence on renal dialysis: Secondary | ICD-10-CM | POA: Diagnosis not present

## 2017-11-17 DIAGNOSIS — D509 Iron deficiency anemia, unspecified: Secondary | ICD-10-CM | POA: Diagnosis not present

## 2017-11-17 DIAGNOSIS — N186 End stage renal disease: Secondary | ICD-10-CM | POA: Diagnosis not present

## 2017-11-17 DIAGNOSIS — D631 Anemia in chronic kidney disease: Secondary | ICD-10-CM | POA: Diagnosis not present

## 2017-11-17 DIAGNOSIS — D472 Monoclonal gammopathy: Secondary | ICD-10-CM | POA: Diagnosis not present

## 2017-11-17 DIAGNOSIS — N2581 Secondary hyperparathyroidism of renal origin: Secondary | ICD-10-CM | POA: Diagnosis not present

## 2017-11-17 DIAGNOSIS — Z992 Dependence on renal dialysis: Secondary | ICD-10-CM | POA: Diagnosis not present

## 2017-11-19 DIAGNOSIS — Z992 Dependence on renal dialysis: Secondary | ICD-10-CM | POA: Diagnosis not present

## 2017-11-19 DIAGNOSIS — Z299 Encounter for prophylactic measures, unspecified: Secondary | ICD-10-CM | POA: Diagnosis not present

## 2017-11-19 DIAGNOSIS — E039 Hypothyroidism, unspecified: Secondary | ICD-10-CM | POA: Diagnosis not present

## 2017-11-19 DIAGNOSIS — N186 End stage renal disease: Secondary | ICD-10-CM | POA: Diagnosis not present

## 2017-11-19 DIAGNOSIS — D509 Iron deficiency anemia, unspecified: Secondary | ICD-10-CM | POA: Diagnosis not present

## 2017-11-19 DIAGNOSIS — E7800 Pure hypercholesterolemia, unspecified: Secondary | ICD-10-CM | POA: Diagnosis not present

## 2017-11-19 DIAGNOSIS — J449 Chronic obstructive pulmonary disease, unspecified: Secondary | ICD-10-CM | POA: Diagnosis not present

## 2017-11-19 DIAGNOSIS — N2581 Secondary hyperparathyroidism of renal origin: Secondary | ICD-10-CM | POA: Diagnosis not present

## 2017-11-19 DIAGNOSIS — M25562 Pain in left knee: Secondary | ICD-10-CM | POA: Diagnosis not present

## 2017-11-19 DIAGNOSIS — Z6821 Body mass index (BMI) 21.0-21.9, adult: Secondary | ICD-10-CM | POA: Diagnosis not present

## 2017-11-19 DIAGNOSIS — I4891 Unspecified atrial fibrillation: Secondary | ICD-10-CM | POA: Diagnosis not present

## 2017-11-19 DIAGNOSIS — M25462 Effusion, left knee: Secondary | ICD-10-CM | POA: Diagnosis not present

## 2017-11-19 DIAGNOSIS — I429 Cardiomyopathy, unspecified: Secondary | ICD-10-CM | POA: Diagnosis not present

## 2017-11-19 DIAGNOSIS — D472 Monoclonal gammopathy: Secondary | ICD-10-CM | POA: Diagnosis not present

## 2017-11-19 DIAGNOSIS — D631 Anemia in chronic kidney disease: Secondary | ICD-10-CM | POA: Diagnosis not present

## 2017-11-19 DIAGNOSIS — C9000 Multiple myeloma not having achieved remission: Secondary | ICD-10-CM | POA: Diagnosis not present

## 2017-11-19 DIAGNOSIS — M1712 Unilateral primary osteoarthritis, left knee: Secondary | ICD-10-CM | POA: Diagnosis not present

## 2017-11-21 DIAGNOSIS — D472 Monoclonal gammopathy: Secondary | ICD-10-CM | POA: Diagnosis not present

## 2017-11-21 DIAGNOSIS — N2581 Secondary hyperparathyroidism of renal origin: Secondary | ICD-10-CM | POA: Diagnosis not present

## 2017-11-21 DIAGNOSIS — N186 End stage renal disease: Secondary | ICD-10-CM | POA: Diagnosis not present

## 2017-11-21 DIAGNOSIS — Z992 Dependence on renal dialysis: Secondary | ICD-10-CM | POA: Diagnosis not present

## 2017-11-21 DIAGNOSIS — D509 Iron deficiency anemia, unspecified: Secondary | ICD-10-CM | POA: Diagnosis not present

## 2017-11-21 DIAGNOSIS — D631 Anemia in chronic kidney disease: Secondary | ICD-10-CM | POA: Diagnosis not present

## 2017-11-24 DIAGNOSIS — N2581 Secondary hyperparathyroidism of renal origin: Secondary | ICD-10-CM | POA: Diagnosis not present

## 2017-11-24 DIAGNOSIS — Z992 Dependence on renal dialysis: Secondary | ICD-10-CM | POA: Diagnosis not present

## 2017-11-24 DIAGNOSIS — D631 Anemia in chronic kidney disease: Secondary | ICD-10-CM | POA: Diagnosis not present

## 2017-11-24 DIAGNOSIS — D509 Iron deficiency anemia, unspecified: Secondary | ICD-10-CM | POA: Diagnosis not present

## 2017-11-24 DIAGNOSIS — D472 Monoclonal gammopathy: Secondary | ICD-10-CM | POA: Diagnosis not present

## 2017-11-24 DIAGNOSIS — N186 End stage renal disease: Secondary | ICD-10-CM | POA: Diagnosis not present

## 2017-11-25 DIAGNOSIS — H401132 Primary open-angle glaucoma, bilateral, moderate stage: Secondary | ICD-10-CM | POA: Diagnosis not present

## 2017-11-25 DIAGNOSIS — H401133 Primary open-angle glaucoma, bilateral, severe stage: Secondary | ICD-10-CM | POA: Diagnosis not present

## 2017-11-26 DIAGNOSIS — Z992 Dependence on renal dialysis: Secondary | ICD-10-CM | POA: Diagnosis not present

## 2017-11-26 DIAGNOSIS — N2581 Secondary hyperparathyroidism of renal origin: Secondary | ICD-10-CM | POA: Diagnosis not present

## 2017-11-26 DIAGNOSIS — N186 End stage renal disease: Secondary | ICD-10-CM | POA: Diagnosis not present

## 2017-11-26 DIAGNOSIS — D509 Iron deficiency anemia, unspecified: Secondary | ICD-10-CM | POA: Diagnosis not present

## 2017-11-26 DIAGNOSIS — D472 Monoclonal gammopathy: Secondary | ICD-10-CM | POA: Diagnosis not present

## 2017-11-26 DIAGNOSIS — D631 Anemia in chronic kidney disease: Secondary | ICD-10-CM | POA: Diagnosis not present

## 2017-11-27 DIAGNOSIS — Z6821 Body mass index (BMI) 21.0-21.9, adult: Secondary | ICD-10-CM | POA: Diagnosis not present

## 2017-11-27 DIAGNOSIS — M25562 Pain in left knee: Secondary | ICD-10-CM | POA: Diagnosis not present

## 2017-11-27 DIAGNOSIS — Z299 Encounter for prophylactic measures, unspecified: Secondary | ICD-10-CM | POA: Diagnosis not present

## 2017-11-28 DIAGNOSIS — D631 Anemia in chronic kidney disease: Secondary | ICD-10-CM | POA: Diagnosis not present

## 2017-11-28 DIAGNOSIS — N2581 Secondary hyperparathyroidism of renal origin: Secondary | ICD-10-CM | POA: Diagnosis not present

## 2017-11-28 DIAGNOSIS — N186 End stage renal disease: Secondary | ICD-10-CM | POA: Diagnosis not present

## 2017-11-28 DIAGNOSIS — D509 Iron deficiency anemia, unspecified: Secondary | ICD-10-CM | POA: Diagnosis not present

## 2017-11-28 DIAGNOSIS — D472 Monoclonal gammopathy: Secondary | ICD-10-CM | POA: Diagnosis not present

## 2017-11-28 DIAGNOSIS — Z992 Dependence on renal dialysis: Secondary | ICD-10-CM | POA: Diagnosis not present

## 2017-12-01 DIAGNOSIS — D631 Anemia in chronic kidney disease: Secondary | ICD-10-CM | POA: Diagnosis not present

## 2017-12-01 DIAGNOSIS — D472 Monoclonal gammopathy: Secondary | ICD-10-CM | POA: Diagnosis not present

## 2017-12-01 DIAGNOSIS — N2581 Secondary hyperparathyroidism of renal origin: Secondary | ICD-10-CM | POA: Diagnosis not present

## 2017-12-01 DIAGNOSIS — D509 Iron deficiency anemia, unspecified: Secondary | ICD-10-CM | POA: Diagnosis not present

## 2017-12-01 DIAGNOSIS — Z992 Dependence on renal dialysis: Secondary | ICD-10-CM | POA: Diagnosis not present

## 2017-12-01 DIAGNOSIS — N186 End stage renal disease: Secondary | ICD-10-CM | POA: Diagnosis not present

## 2017-12-02 DIAGNOSIS — N186 End stage renal disease: Secondary | ICD-10-CM | POA: Diagnosis not present

## 2017-12-02 DIAGNOSIS — Z992 Dependence on renal dialysis: Secondary | ICD-10-CM | POA: Diagnosis not present

## 2017-12-03 DIAGNOSIS — N2581 Secondary hyperparathyroidism of renal origin: Secondary | ICD-10-CM | POA: Diagnosis not present

## 2017-12-03 DIAGNOSIS — Z992 Dependence on renal dialysis: Secondary | ICD-10-CM | POA: Diagnosis not present

## 2017-12-03 DIAGNOSIS — D509 Iron deficiency anemia, unspecified: Secondary | ICD-10-CM | POA: Diagnosis not present

## 2017-12-03 DIAGNOSIS — N186 End stage renal disease: Secondary | ICD-10-CM | POA: Diagnosis not present

## 2017-12-03 DIAGNOSIS — D472 Monoclonal gammopathy: Secondary | ICD-10-CM | POA: Diagnosis not present

## 2017-12-03 DIAGNOSIS — D631 Anemia in chronic kidney disease: Secondary | ICD-10-CM | POA: Diagnosis not present

## 2017-12-05 DIAGNOSIS — N186 End stage renal disease: Secondary | ICD-10-CM | POA: Diagnosis not present

## 2017-12-05 DIAGNOSIS — D631 Anemia in chronic kidney disease: Secondary | ICD-10-CM | POA: Diagnosis not present

## 2017-12-05 DIAGNOSIS — Z992 Dependence on renal dialysis: Secondary | ICD-10-CM | POA: Diagnosis not present

## 2017-12-05 DIAGNOSIS — D509 Iron deficiency anemia, unspecified: Secondary | ICD-10-CM | POA: Diagnosis not present

## 2017-12-05 DIAGNOSIS — N2581 Secondary hyperparathyroidism of renal origin: Secondary | ICD-10-CM | POA: Diagnosis not present

## 2017-12-05 DIAGNOSIS — D472 Monoclonal gammopathy: Secondary | ICD-10-CM | POA: Diagnosis not present

## 2017-12-08 DIAGNOSIS — D472 Monoclonal gammopathy: Secondary | ICD-10-CM | POA: Diagnosis not present

## 2017-12-08 DIAGNOSIS — N2581 Secondary hyperparathyroidism of renal origin: Secondary | ICD-10-CM | POA: Diagnosis not present

## 2017-12-08 DIAGNOSIS — N186 End stage renal disease: Secondary | ICD-10-CM | POA: Diagnosis not present

## 2017-12-08 DIAGNOSIS — D509 Iron deficiency anemia, unspecified: Secondary | ICD-10-CM | POA: Diagnosis not present

## 2017-12-08 DIAGNOSIS — Z992 Dependence on renal dialysis: Secondary | ICD-10-CM | POA: Diagnosis not present

## 2017-12-08 DIAGNOSIS — D631 Anemia in chronic kidney disease: Secondary | ICD-10-CM | POA: Diagnosis not present

## 2017-12-10 DIAGNOSIS — D631 Anemia in chronic kidney disease: Secondary | ICD-10-CM | POA: Diagnosis not present

## 2017-12-10 DIAGNOSIS — Z992 Dependence on renal dialysis: Secondary | ICD-10-CM | POA: Diagnosis not present

## 2017-12-10 DIAGNOSIS — N186 End stage renal disease: Secondary | ICD-10-CM | POA: Diagnosis not present

## 2017-12-10 DIAGNOSIS — D472 Monoclonal gammopathy: Secondary | ICD-10-CM | POA: Diagnosis not present

## 2017-12-10 DIAGNOSIS — N2581 Secondary hyperparathyroidism of renal origin: Secondary | ICD-10-CM | POA: Diagnosis not present

## 2017-12-10 DIAGNOSIS — D509 Iron deficiency anemia, unspecified: Secondary | ICD-10-CM | POA: Diagnosis not present

## 2017-12-12 DIAGNOSIS — D509 Iron deficiency anemia, unspecified: Secondary | ICD-10-CM | POA: Diagnosis not present

## 2017-12-12 DIAGNOSIS — Z992 Dependence on renal dialysis: Secondary | ICD-10-CM | POA: Diagnosis not present

## 2017-12-12 DIAGNOSIS — N186 End stage renal disease: Secondary | ICD-10-CM | POA: Diagnosis not present

## 2017-12-12 DIAGNOSIS — D631 Anemia in chronic kidney disease: Secondary | ICD-10-CM | POA: Diagnosis not present

## 2017-12-12 DIAGNOSIS — D472 Monoclonal gammopathy: Secondary | ICD-10-CM | POA: Diagnosis not present

## 2017-12-12 DIAGNOSIS — N2581 Secondary hyperparathyroidism of renal origin: Secondary | ICD-10-CM | POA: Diagnosis not present

## 2017-12-15 DIAGNOSIS — N186 End stage renal disease: Secondary | ICD-10-CM | POA: Diagnosis not present

## 2017-12-15 DIAGNOSIS — D472 Monoclonal gammopathy: Secondary | ICD-10-CM | POA: Diagnosis not present

## 2017-12-15 DIAGNOSIS — D631 Anemia in chronic kidney disease: Secondary | ICD-10-CM | POA: Diagnosis not present

## 2017-12-15 DIAGNOSIS — Z992 Dependence on renal dialysis: Secondary | ICD-10-CM | POA: Diagnosis not present

## 2017-12-15 DIAGNOSIS — N2581 Secondary hyperparathyroidism of renal origin: Secondary | ICD-10-CM | POA: Diagnosis not present

## 2017-12-15 DIAGNOSIS — D509 Iron deficiency anemia, unspecified: Secondary | ICD-10-CM | POA: Diagnosis not present

## 2017-12-16 DIAGNOSIS — H401133 Primary open-angle glaucoma, bilateral, severe stage: Secondary | ICD-10-CM | POA: Diagnosis not present

## 2017-12-17 DIAGNOSIS — D472 Monoclonal gammopathy: Secondary | ICD-10-CM | POA: Diagnosis not present

## 2017-12-17 DIAGNOSIS — Z992 Dependence on renal dialysis: Secondary | ICD-10-CM | POA: Diagnosis not present

## 2017-12-17 DIAGNOSIS — N2581 Secondary hyperparathyroidism of renal origin: Secondary | ICD-10-CM | POA: Diagnosis not present

## 2017-12-17 DIAGNOSIS — D631 Anemia in chronic kidney disease: Secondary | ICD-10-CM | POA: Diagnosis not present

## 2017-12-17 DIAGNOSIS — N186 End stage renal disease: Secondary | ICD-10-CM | POA: Diagnosis not present

## 2017-12-17 DIAGNOSIS — D509 Iron deficiency anemia, unspecified: Secondary | ICD-10-CM | POA: Diagnosis not present

## 2017-12-19 DIAGNOSIS — D509 Iron deficiency anemia, unspecified: Secondary | ICD-10-CM | POA: Diagnosis not present

## 2017-12-19 DIAGNOSIS — Z992 Dependence on renal dialysis: Secondary | ICD-10-CM | POA: Diagnosis not present

## 2017-12-19 DIAGNOSIS — D472 Monoclonal gammopathy: Secondary | ICD-10-CM | POA: Diagnosis not present

## 2017-12-19 DIAGNOSIS — N2581 Secondary hyperparathyroidism of renal origin: Secondary | ICD-10-CM | POA: Diagnosis not present

## 2017-12-19 DIAGNOSIS — D631 Anemia in chronic kidney disease: Secondary | ICD-10-CM | POA: Diagnosis not present

## 2017-12-19 DIAGNOSIS — N186 End stage renal disease: Secondary | ICD-10-CM | POA: Diagnosis not present

## 2017-12-22 DIAGNOSIS — D509 Iron deficiency anemia, unspecified: Secondary | ICD-10-CM | POA: Diagnosis not present

## 2017-12-22 DIAGNOSIS — N186 End stage renal disease: Secondary | ICD-10-CM | POA: Diagnosis not present

## 2017-12-22 DIAGNOSIS — N2581 Secondary hyperparathyroidism of renal origin: Secondary | ICD-10-CM | POA: Diagnosis not present

## 2017-12-22 DIAGNOSIS — D631 Anemia in chronic kidney disease: Secondary | ICD-10-CM | POA: Diagnosis not present

## 2017-12-22 DIAGNOSIS — Z992 Dependence on renal dialysis: Secondary | ICD-10-CM | POA: Diagnosis not present

## 2017-12-22 DIAGNOSIS — D472 Monoclonal gammopathy: Secondary | ICD-10-CM | POA: Diagnosis not present

## 2017-12-24 DIAGNOSIS — D631 Anemia in chronic kidney disease: Secondary | ICD-10-CM | POA: Diagnosis not present

## 2017-12-24 DIAGNOSIS — D472 Monoclonal gammopathy: Secondary | ICD-10-CM | POA: Diagnosis not present

## 2017-12-24 DIAGNOSIS — D509 Iron deficiency anemia, unspecified: Secondary | ICD-10-CM | POA: Diagnosis not present

## 2017-12-24 DIAGNOSIS — Z992 Dependence on renal dialysis: Secondary | ICD-10-CM | POA: Diagnosis not present

## 2017-12-24 DIAGNOSIS — N2581 Secondary hyperparathyroidism of renal origin: Secondary | ICD-10-CM | POA: Diagnosis not present

## 2017-12-24 DIAGNOSIS — N186 End stage renal disease: Secondary | ICD-10-CM | POA: Diagnosis not present

## 2017-12-26 DIAGNOSIS — D472 Monoclonal gammopathy: Secondary | ICD-10-CM | POA: Diagnosis not present

## 2017-12-26 DIAGNOSIS — Z992 Dependence on renal dialysis: Secondary | ICD-10-CM | POA: Diagnosis not present

## 2017-12-26 DIAGNOSIS — N186 End stage renal disease: Secondary | ICD-10-CM | POA: Diagnosis not present

## 2017-12-26 DIAGNOSIS — N2581 Secondary hyperparathyroidism of renal origin: Secondary | ICD-10-CM | POA: Diagnosis not present

## 2017-12-26 DIAGNOSIS — D509 Iron deficiency anemia, unspecified: Secondary | ICD-10-CM | POA: Diagnosis not present

## 2017-12-26 DIAGNOSIS — D631 Anemia in chronic kidney disease: Secondary | ICD-10-CM | POA: Diagnosis not present

## 2017-12-29 DIAGNOSIS — D631 Anemia in chronic kidney disease: Secondary | ICD-10-CM | POA: Diagnosis not present

## 2017-12-29 DIAGNOSIS — D472 Monoclonal gammopathy: Secondary | ICD-10-CM | POA: Diagnosis not present

## 2017-12-29 DIAGNOSIS — N2581 Secondary hyperparathyroidism of renal origin: Secondary | ICD-10-CM | POA: Diagnosis not present

## 2017-12-29 DIAGNOSIS — D509 Iron deficiency anemia, unspecified: Secondary | ICD-10-CM | POA: Diagnosis not present

## 2017-12-29 DIAGNOSIS — Z992 Dependence on renal dialysis: Secondary | ICD-10-CM | POA: Diagnosis not present

## 2017-12-29 DIAGNOSIS — N186 End stage renal disease: Secondary | ICD-10-CM | POA: Diagnosis not present

## 2017-12-31 DIAGNOSIS — Z992 Dependence on renal dialysis: Secondary | ICD-10-CM | POA: Diagnosis not present

## 2017-12-31 DIAGNOSIS — N186 End stage renal disease: Secondary | ICD-10-CM | POA: Diagnosis not present

## 2017-12-31 DIAGNOSIS — D472 Monoclonal gammopathy: Secondary | ICD-10-CM | POA: Diagnosis not present

## 2017-12-31 DIAGNOSIS — D509 Iron deficiency anemia, unspecified: Secondary | ICD-10-CM | POA: Diagnosis not present

## 2017-12-31 DIAGNOSIS — N2581 Secondary hyperparathyroidism of renal origin: Secondary | ICD-10-CM | POA: Diagnosis not present

## 2017-12-31 DIAGNOSIS — D631 Anemia in chronic kidney disease: Secondary | ICD-10-CM | POA: Diagnosis not present

## 2018-01-02 DIAGNOSIS — N186 End stage renal disease: Secondary | ICD-10-CM | POA: Diagnosis not present

## 2018-01-02 DIAGNOSIS — D472 Monoclonal gammopathy: Secondary | ICD-10-CM | POA: Diagnosis not present

## 2018-01-02 DIAGNOSIS — N2581 Secondary hyperparathyroidism of renal origin: Secondary | ICD-10-CM | POA: Diagnosis not present

## 2018-01-02 DIAGNOSIS — D509 Iron deficiency anemia, unspecified: Secondary | ICD-10-CM | POA: Diagnosis not present

## 2018-01-02 DIAGNOSIS — Z992 Dependence on renal dialysis: Secondary | ICD-10-CM | POA: Diagnosis not present

## 2018-01-02 DIAGNOSIS — D631 Anemia in chronic kidney disease: Secondary | ICD-10-CM | POA: Diagnosis not present

## 2018-01-05 DIAGNOSIS — N2581 Secondary hyperparathyroidism of renal origin: Secondary | ICD-10-CM | POA: Diagnosis not present

## 2018-01-05 DIAGNOSIS — Z992 Dependence on renal dialysis: Secondary | ICD-10-CM | POA: Diagnosis not present

## 2018-01-05 DIAGNOSIS — D509 Iron deficiency anemia, unspecified: Secondary | ICD-10-CM | POA: Diagnosis not present

## 2018-01-05 DIAGNOSIS — N186 End stage renal disease: Secondary | ICD-10-CM | POA: Diagnosis not present

## 2018-01-05 DIAGNOSIS — D472 Monoclonal gammopathy: Secondary | ICD-10-CM | POA: Diagnosis not present

## 2018-01-07 DIAGNOSIS — D472 Monoclonal gammopathy: Secondary | ICD-10-CM | POA: Diagnosis not present

## 2018-01-07 DIAGNOSIS — N186 End stage renal disease: Secondary | ICD-10-CM | POA: Diagnosis not present

## 2018-01-07 DIAGNOSIS — D509 Iron deficiency anemia, unspecified: Secondary | ICD-10-CM | POA: Diagnosis not present

## 2018-01-07 DIAGNOSIS — N2581 Secondary hyperparathyroidism of renal origin: Secondary | ICD-10-CM | POA: Diagnosis not present

## 2018-01-07 DIAGNOSIS — Z992 Dependence on renal dialysis: Secondary | ICD-10-CM | POA: Diagnosis not present

## 2018-01-09 DIAGNOSIS — D509 Iron deficiency anemia, unspecified: Secondary | ICD-10-CM | POA: Diagnosis not present

## 2018-01-09 DIAGNOSIS — N186 End stage renal disease: Secondary | ICD-10-CM | POA: Diagnosis not present

## 2018-01-09 DIAGNOSIS — N2581 Secondary hyperparathyroidism of renal origin: Secondary | ICD-10-CM | POA: Diagnosis not present

## 2018-01-09 DIAGNOSIS — D472 Monoclonal gammopathy: Secondary | ICD-10-CM | POA: Diagnosis not present

## 2018-01-09 DIAGNOSIS — Z992 Dependence on renal dialysis: Secondary | ICD-10-CM | POA: Diagnosis not present

## 2018-01-12 DIAGNOSIS — D509 Iron deficiency anemia, unspecified: Secondary | ICD-10-CM | POA: Diagnosis not present

## 2018-01-12 DIAGNOSIS — N186 End stage renal disease: Secondary | ICD-10-CM | POA: Diagnosis not present

## 2018-01-12 DIAGNOSIS — N2581 Secondary hyperparathyroidism of renal origin: Secondary | ICD-10-CM | POA: Diagnosis not present

## 2018-01-12 DIAGNOSIS — Z992 Dependence on renal dialysis: Secondary | ICD-10-CM | POA: Diagnosis not present

## 2018-01-12 DIAGNOSIS — D472 Monoclonal gammopathy: Secondary | ICD-10-CM | POA: Diagnosis not present

## 2018-01-14 DIAGNOSIS — D472 Monoclonal gammopathy: Secondary | ICD-10-CM | POA: Diagnosis not present

## 2018-01-14 DIAGNOSIS — D509 Iron deficiency anemia, unspecified: Secondary | ICD-10-CM | POA: Diagnosis not present

## 2018-01-14 DIAGNOSIS — N2581 Secondary hyperparathyroidism of renal origin: Secondary | ICD-10-CM | POA: Diagnosis not present

## 2018-01-14 DIAGNOSIS — N186 End stage renal disease: Secondary | ICD-10-CM | POA: Diagnosis not present

## 2018-01-14 DIAGNOSIS — Z992 Dependence on renal dialysis: Secondary | ICD-10-CM | POA: Diagnosis not present

## 2018-01-16 DIAGNOSIS — N186 End stage renal disease: Secondary | ICD-10-CM | POA: Diagnosis not present

## 2018-01-16 DIAGNOSIS — D472 Monoclonal gammopathy: Secondary | ICD-10-CM | POA: Diagnosis not present

## 2018-01-16 DIAGNOSIS — D509 Iron deficiency anemia, unspecified: Secondary | ICD-10-CM | POA: Diagnosis not present

## 2018-01-16 DIAGNOSIS — Z992 Dependence on renal dialysis: Secondary | ICD-10-CM | POA: Diagnosis not present

## 2018-01-16 DIAGNOSIS — N2581 Secondary hyperparathyroidism of renal origin: Secondary | ICD-10-CM | POA: Diagnosis not present

## 2018-01-19 DIAGNOSIS — Z992 Dependence on renal dialysis: Secondary | ICD-10-CM | POA: Diagnosis not present

## 2018-01-19 DIAGNOSIS — D472 Monoclonal gammopathy: Secondary | ICD-10-CM | POA: Diagnosis not present

## 2018-01-19 DIAGNOSIS — D509 Iron deficiency anemia, unspecified: Secondary | ICD-10-CM | POA: Diagnosis not present

## 2018-01-19 DIAGNOSIS — N186 End stage renal disease: Secondary | ICD-10-CM | POA: Diagnosis not present

## 2018-01-19 DIAGNOSIS — N2581 Secondary hyperparathyroidism of renal origin: Secondary | ICD-10-CM | POA: Diagnosis not present

## 2018-01-23 DIAGNOSIS — Z992 Dependence on renal dialysis: Secondary | ICD-10-CM | POA: Diagnosis not present

## 2018-01-23 DIAGNOSIS — D509 Iron deficiency anemia, unspecified: Secondary | ICD-10-CM | POA: Diagnosis not present

## 2018-01-23 DIAGNOSIS — D472 Monoclonal gammopathy: Secondary | ICD-10-CM | POA: Diagnosis not present

## 2018-01-23 DIAGNOSIS — N186 End stage renal disease: Secondary | ICD-10-CM | POA: Diagnosis not present

## 2018-01-23 DIAGNOSIS — N2581 Secondary hyperparathyroidism of renal origin: Secondary | ICD-10-CM | POA: Diagnosis not present

## 2018-01-26 DIAGNOSIS — N186 End stage renal disease: Secondary | ICD-10-CM | POA: Diagnosis not present

## 2018-01-26 DIAGNOSIS — D472 Monoclonal gammopathy: Secondary | ICD-10-CM | POA: Diagnosis not present

## 2018-01-26 DIAGNOSIS — D509 Iron deficiency anemia, unspecified: Secondary | ICD-10-CM | POA: Diagnosis not present

## 2018-01-26 DIAGNOSIS — Z992 Dependence on renal dialysis: Secondary | ICD-10-CM | POA: Diagnosis not present

## 2018-01-26 DIAGNOSIS — N2581 Secondary hyperparathyroidism of renal origin: Secondary | ICD-10-CM | POA: Diagnosis not present

## 2018-01-28 DIAGNOSIS — D509 Iron deficiency anemia, unspecified: Secondary | ICD-10-CM | POA: Diagnosis not present

## 2018-01-28 DIAGNOSIS — N2581 Secondary hyperparathyroidism of renal origin: Secondary | ICD-10-CM | POA: Diagnosis not present

## 2018-01-28 DIAGNOSIS — D472 Monoclonal gammopathy: Secondary | ICD-10-CM | POA: Diagnosis not present

## 2018-01-28 DIAGNOSIS — Z992 Dependence on renal dialysis: Secondary | ICD-10-CM | POA: Diagnosis not present

## 2018-01-28 DIAGNOSIS — N186 End stage renal disease: Secondary | ICD-10-CM | POA: Diagnosis not present

## 2018-01-30 DIAGNOSIS — D472 Monoclonal gammopathy: Secondary | ICD-10-CM | POA: Diagnosis not present

## 2018-01-30 DIAGNOSIS — D509 Iron deficiency anemia, unspecified: Secondary | ICD-10-CM | POA: Diagnosis not present

## 2018-01-30 DIAGNOSIS — N186 End stage renal disease: Secondary | ICD-10-CM | POA: Diagnosis not present

## 2018-01-30 DIAGNOSIS — Z992 Dependence on renal dialysis: Secondary | ICD-10-CM | POA: Diagnosis not present

## 2018-01-30 DIAGNOSIS — N2581 Secondary hyperparathyroidism of renal origin: Secondary | ICD-10-CM | POA: Diagnosis not present

## 2018-02-01 DIAGNOSIS — Z992 Dependence on renal dialysis: Secondary | ICD-10-CM | POA: Diagnosis not present

## 2018-02-01 DIAGNOSIS — N186 End stage renal disease: Secondary | ICD-10-CM | POA: Diagnosis not present

## 2018-02-02 DIAGNOSIS — N186 End stage renal disease: Secondary | ICD-10-CM | POA: Diagnosis not present

## 2018-02-02 DIAGNOSIS — Z992 Dependence on renal dialysis: Secondary | ICD-10-CM | POA: Diagnosis not present

## 2018-02-02 DIAGNOSIS — N2581 Secondary hyperparathyroidism of renal origin: Secondary | ICD-10-CM | POA: Diagnosis not present

## 2018-02-02 DIAGNOSIS — D631 Anemia in chronic kidney disease: Secondary | ICD-10-CM | POA: Diagnosis not present

## 2018-02-02 DIAGNOSIS — D472 Monoclonal gammopathy: Secondary | ICD-10-CM | POA: Diagnosis not present

## 2018-02-04 DIAGNOSIS — N186 End stage renal disease: Secondary | ICD-10-CM | POA: Diagnosis not present

## 2018-02-04 DIAGNOSIS — D472 Monoclonal gammopathy: Secondary | ICD-10-CM | POA: Diagnosis not present

## 2018-02-04 DIAGNOSIS — N2581 Secondary hyperparathyroidism of renal origin: Secondary | ICD-10-CM | POA: Diagnosis not present

## 2018-02-04 DIAGNOSIS — Z992 Dependence on renal dialysis: Secondary | ICD-10-CM | POA: Diagnosis not present

## 2018-02-04 DIAGNOSIS — D631 Anemia in chronic kidney disease: Secondary | ICD-10-CM | POA: Diagnosis not present

## 2018-02-06 DIAGNOSIS — D472 Monoclonal gammopathy: Secondary | ICD-10-CM | POA: Diagnosis not present

## 2018-02-06 DIAGNOSIS — N2581 Secondary hyperparathyroidism of renal origin: Secondary | ICD-10-CM | POA: Diagnosis not present

## 2018-02-06 DIAGNOSIS — D631 Anemia in chronic kidney disease: Secondary | ICD-10-CM | POA: Diagnosis not present

## 2018-02-06 DIAGNOSIS — N186 End stage renal disease: Secondary | ICD-10-CM | POA: Diagnosis not present

## 2018-02-06 DIAGNOSIS — Z992 Dependence on renal dialysis: Secondary | ICD-10-CM | POA: Diagnosis not present

## 2018-02-09 DIAGNOSIS — Z992 Dependence on renal dialysis: Secondary | ICD-10-CM | POA: Diagnosis not present

## 2018-02-09 DIAGNOSIS — N2581 Secondary hyperparathyroidism of renal origin: Secondary | ICD-10-CM | POA: Diagnosis not present

## 2018-02-09 DIAGNOSIS — D472 Monoclonal gammopathy: Secondary | ICD-10-CM | POA: Diagnosis not present

## 2018-02-09 DIAGNOSIS — N186 End stage renal disease: Secondary | ICD-10-CM | POA: Diagnosis not present

## 2018-02-09 DIAGNOSIS — D631 Anemia in chronic kidney disease: Secondary | ICD-10-CM | POA: Diagnosis not present

## 2018-02-11 DIAGNOSIS — D472 Monoclonal gammopathy: Secondary | ICD-10-CM | POA: Diagnosis not present

## 2018-02-11 DIAGNOSIS — N186 End stage renal disease: Secondary | ICD-10-CM | POA: Diagnosis not present

## 2018-02-11 DIAGNOSIS — Z992 Dependence on renal dialysis: Secondary | ICD-10-CM | POA: Diagnosis not present

## 2018-02-11 DIAGNOSIS — D631 Anemia in chronic kidney disease: Secondary | ICD-10-CM | POA: Diagnosis not present

## 2018-02-11 DIAGNOSIS — N2581 Secondary hyperparathyroidism of renal origin: Secondary | ICD-10-CM | POA: Diagnosis not present

## 2018-02-13 DIAGNOSIS — D472 Monoclonal gammopathy: Secondary | ICD-10-CM | POA: Diagnosis not present

## 2018-02-13 DIAGNOSIS — N2581 Secondary hyperparathyroidism of renal origin: Secondary | ICD-10-CM | POA: Diagnosis not present

## 2018-02-13 DIAGNOSIS — D631 Anemia in chronic kidney disease: Secondary | ICD-10-CM | POA: Diagnosis not present

## 2018-02-13 DIAGNOSIS — N186 End stage renal disease: Secondary | ICD-10-CM | POA: Diagnosis not present

## 2018-02-13 DIAGNOSIS — Z992 Dependence on renal dialysis: Secondary | ICD-10-CM | POA: Diagnosis not present

## 2018-02-16 DIAGNOSIS — Z992 Dependence on renal dialysis: Secondary | ICD-10-CM | POA: Diagnosis not present

## 2018-02-16 DIAGNOSIS — N2581 Secondary hyperparathyroidism of renal origin: Secondary | ICD-10-CM | POA: Diagnosis not present

## 2018-02-16 DIAGNOSIS — D472 Monoclonal gammopathy: Secondary | ICD-10-CM | POA: Diagnosis not present

## 2018-02-16 DIAGNOSIS — N186 End stage renal disease: Secondary | ICD-10-CM | POA: Diagnosis not present

## 2018-02-16 DIAGNOSIS — D631 Anemia in chronic kidney disease: Secondary | ICD-10-CM | POA: Diagnosis not present

## 2018-02-18 DIAGNOSIS — D472 Monoclonal gammopathy: Secondary | ICD-10-CM | POA: Diagnosis not present

## 2018-02-18 DIAGNOSIS — D631 Anemia in chronic kidney disease: Secondary | ICD-10-CM | POA: Diagnosis not present

## 2018-02-18 DIAGNOSIS — N2581 Secondary hyperparathyroidism of renal origin: Secondary | ICD-10-CM | POA: Diagnosis not present

## 2018-02-18 DIAGNOSIS — Z992 Dependence on renal dialysis: Secondary | ICD-10-CM | POA: Diagnosis not present

## 2018-02-18 DIAGNOSIS — N186 End stage renal disease: Secondary | ICD-10-CM | POA: Diagnosis not present

## 2018-02-20 DIAGNOSIS — Z992 Dependence on renal dialysis: Secondary | ICD-10-CM | POA: Diagnosis not present

## 2018-02-20 DIAGNOSIS — D472 Monoclonal gammopathy: Secondary | ICD-10-CM | POA: Diagnosis not present

## 2018-02-20 DIAGNOSIS — D631 Anemia in chronic kidney disease: Secondary | ICD-10-CM | POA: Diagnosis not present

## 2018-02-20 DIAGNOSIS — N2581 Secondary hyperparathyroidism of renal origin: Secondary | ICD-10-CM | POA: Diagnosis not present

## 2018-02-20 DIAGNOSIS — N186 End stage renal disease: Secondary | ICD-10-CM | POA: Diagnosis not present

## 2018-02-23 DIAGNOSIS — D631 Anemia in chronic kidney disease: Secondary | ICD-10-CM | POA: Diagnosis not present

## 2018-02-23 DIAGNOSIS — D472 Monoclonal gammopathy: Secondary | ICD-10-CM | POA: Diagnosis not present

## 2018-02-23 DIAGNOSIS — N2581 Secondary hyperparathyroidism of renal origin: Secondary | ICD-10-CM | POA: Diagnosis not present

## 2018-02-23 DIAGNOSIS — N186 End stage renal disease: Secondary | ICD-10-CM | POA: Diagnosis not present

## 2018-02-23 DIAGNOSIS — Z992 Dependence on renal dialysis: Secondary | ICD-10-CM | POA: Diagnosis not present

## 2018-02-25 DIAGNOSIS — D631 Anemia in chronic kidney disease: Secondary | ICD-10-CM | POA: Diagnosis not present

## 2018-02-25 DIAGNOSIS — N186 End stage renal disease: Secondary | ICD-10-CM | POA: Diagnosis not present

## 2018-02-25 DIAGNOSIS — Z992 Dependence on renal dialysis: Secondary | ICD-10-CM | POA: Diagnosis not present

## 2018-02-25 DIAGNOSIS — N2581 Secondary hyperparathyroidism of renal origin: Secondary | ICD-10-CM | POA: Diagnosis not present

## 2018-02-25 DIAGNOSIS — D472 Monoclonal gammopathy: Secondary | ICD-10-CM | POA: Diagnosis not present

## 2018-02-27 DIAGNOSIS — Z992 Dependence on renal dialysis: Secondary | ICD-10-CM | POA: Diagnosis not present

## 2018-02-27 DIAGNOSIS — N2581 Secondary hyperparathyroidism of renal origin: Secondary | ICD-10-CM | POA: Diagnosis not present

## 2018-02-27 DIAGNOSIS — D472 Monoclonal gammopathy: Secondary | ICD-10-CM | POA: Diagnosis not present

## 2018-02-27 DIAGNOSIS — N186 End stage renal disease: Secondary | ICD-10-CM | POA: Diagnosis not present

## 2018-02-27 DIAGNOSIS — D631 Anemia in chronic kidney disease: Secondary | ICD-10-CM | POA: Diagnosis not present

## 2018-03-02 DIAGNOSIS — N186 End stage renal disease: Secondary | ICD-10-CM | POA: Diagnosis not present

## 2018-03-02 DIAGNOSIS — D631 Anemia in chronic kidney disease: Secondary | ICD-10-CM | POA: Diagnosis not present

## 2018-03-02 DIAGNOSIS — D472 Monoclonal gammopathy: Secondary | ICD-10-CM | POA: Diagnosis not present

## 2018-03-02 DIAGNOSIS — Z992 Dependence on renal dialysis: Secondary | ICD-10-CM | POA: Diagnosis not present

## 2018-03-02 DIAGNOSIS — N2581 Secondary hyperparathyroidism of renal origin: Secondary | ICD-10-CM | POA: Diagnosis not present

## 2018-03-04 DIAGNOSIS — N2581 Secondary hyperparathyroidism of renal origin: Secondary | ICD-10-CM | POA: Diagnosis not present

## 2018-03-04 DIAGNOSIS — D631 Anemia in chronic kidney disease: Secondary | ICD-10-CM | POA: Diagnosis not present

## 2018-03-04 DIAGNOSIS — D472 Monoclonal gammopathy: Secondary | ICD-10-CM | POA: Diagnosis not present

## 2018-03-04 DIAGNOSIS — Z992 Dependence on renal dialysis: Secondary | ICD-10-CM | POA: Diagnosis not present

## 2018-03-04 DIAGNOSIS — N186 End stage renal disease: Secondary | ICD-10-CM | POA: Diagnosis not present

## 2018-03-06 DIAGNOSIS — D509 Iron deficiency anemia, unspecified: Secondary | ICD-10-CM | POA: Diagnosis not present

## 2018-03-06 DIAGNOSIS — D472 Monoclonal gammopathy: Secondary | ICD-10-CM | POA: Diagnosis not present

## 2018-03-06 DIAGNOSIS — N2581 Secondary hyperparathyroidism of renal origin: Secondary | ICD-10-CM | POA: Diagnosis not present

## 2018-03-06 DIAGNOSIS — Z992 Dependence on renal dialysis: Secondary | ICD-10-CM | POA: Diagnosis not present

## 2018-03-06 DIAGNOSIS — N186 End stage renal disease: Secondary | ICD-10-CM | POA: Diagnosis not present

## 2018-03-06 DIAGNOSIS — D631 Anemia in chronic kidney disease: Secondary | ICD-10-CM | POA: Diagnosis not present

## 2018-03-09 DIAGNOSIS — D472 Monoclonal gammopathy: Secondary | ICD-10-CM | POA: Diagnosis not present

## 2018-03-09 DIAGNOSIS — N2581 Secondary hyperparathyroidism of renal origin: Secondary | ICD-10-CM | POA: Diagnosis not present

## 2018-03-09 DIAGNOSIS — D509 Iron deficiency anemia, unspecified: Secondary | ICD-10-CM | POA: Diagnosis not present

## 2018-03-09 DIAGNOSIS — Z992 Dependence on renal dialysis: Secondary | ICD-10-CM | POA: Diagnosis not present

## 2018-03-09 DIAGNOSIS — N186 End stage renal disease: Secondary | ICD-10-CM | POA: Diagnosis not present

## 2018-03-09 DIAGNOSIS — D631 Anemia in chronic kidney disease: Secondary | ICD-10-CM | POA: Diagnosis not present

## 2018-03-11 DIAGNOSIS — Z992 Dependence on renal dialysis: Secondary | ICD-10-CM | POA: Diagnosis not present

## 2018-03-11 DIAGNOSIS — D509 Iron deficiency anemia, unspecified: Secondary | ICD-10-CM | POA: Diagnosis not present

## 2018-03-11 DIAGNOSIS — D472 Monoclonal gammopathy: Secondary | ICD-10-CM | POA: Diagnosis not present

## 2018-03-11 DIAGNOSIS — D631 Anemia in chronic kidney disease: Secondary | ICD-10-CM | POA: Diagnosis not present

## 2018-03-11 DIAGNOSIS — N2581 Secondary hyperparathyroidism of renal origin: Secondary | ICD-10-CM | POA: Diagnosis not present

## 2018-03-11 DIAGNOSIS — N186 End stage renal disease: Secondary | ICD-10-CM | POA: Diagnosis not present

## 2018-03-13 DIAGNOSIS — D509 Iron deficiency anemia, unspecified: Secondary | ICD-10-CM | POA: Diagnosis not present

## 2018-03-13 DIAGNOSIS — N2581 Secondary hyperparathyroidism of renal origin: Secondary | ICD-10-CM | POA: Diagnosis not present

## 2018-03-13 DIAGNOSIS — D631 Anemia in chronic kidney disease: Secondary | ICD-10-CM | POA: Diagnosis not present

## 2018-03-13 DIAGNOSIS — D472 Monoclonal gammopathy: Secondary | ICD-10-CM | POA: Diagnosis not present

## 2018-03-13 DIAGNOSIS — Z992 Dependence on renal dialysis: Secondary | ICD-10-CM | POA: Diagnosis not present

## 2018-03-13 DIAGNOSIS — N186 End stage renal disease: Secondary | ICD-10-CM | POA: Diagnosis not present

## 2018-03-16 DIAGNOSIS — D472 Monoclonal gammopathy: Secondary | ICD-10-CM | POA: Diagnosis not present

## 2018-03-16 DIAGNOSIS — N2581 Secondary hyperparathyroidism of renal origin: Secondary | ICD-10-CM | POA: Diagnosis not present

## 2018-03-16 DIAGNOSIS — D509 Iron deficiency anemia, unspecified: Secondary | ICD-10-CM | POA: Diagnosis not present

## 2018-03-16 DIAGNOSIS — D631 Anemia in chronic kidney disease: Secondary | ICD-10-CM | POA: Diagnosis not present

## 2018-03-16 DIAGNOSIS — N186 End stage renal disease: Secondary | ICD-10-CM | POA: Diagnosis not present

## 2018-03-16 DIAGNOSIS — Z992 Dependence on renal dialysis: Secondary | ICD-10-CM | POA: Diagnosis not present

## 2018-03-18 DIAGNOSIS — I4891 Unspecified atrial fibrillation: Secondary | ICD-10-CM | POA: Diagnosis not present

## 2018-03-18 DIAGNOSIS — J449 Chronic obstructive pulmonary disease, unspecified: Secondary | ICD-10-CM | POA: Diagnosis not present

## 2018-03-18 DIAGNOSIS — I10 Essential (primary) hypertension: Secondary | ICD-10-CM | POA: Diagnosis not present

## 2018-03-18 DIAGNOSIS — D509 Iron deficiency anemia, unspecified: Secondary | ICD-10-CM | POA: Diagnosis not present

## 2018-03-18 DIAGNOSIS — N186 End stage renal disease: Secondary | ICD-10-CM | POA: Diagnosis not present

## 2018-03-18 DIAGNOSIS — Z299 Encounter for prophylactic measures, unspecified: Secondary | ICD-10-CM | POA: Diagnosis not present

## 2018-03-18 DIAGNOSIS — N2581 Secondary hyperparathyroidism of renal origin: Secondary | ICD-10-CM | POA: Diagnosis not present

## 2018-03-18 DIAGNOSIS — M549 Dorsalgia, unspecified: Secondary | ICD-10-CM | POA: Diagnosis not present

## 2018-03-18 DIAGNOSIS — M545 Low back pain: Secondary | ICD-10-CM | POA: Diagnosis not present

## 2018-03-18 DIAGNOSIS — D472 Monoclonal gammopathy: Secondary | ICD-10-CM | POA: Diagnosis not present

## 2018-03-18 DIAGNOSIS — Z992 Dependence on renal dialysis: Secondary | ICD-10-CM | POA: Diagnosis not present

## 2018-03-18 DIAGNOSIS — C9000 Multiple myeloma not having achieved remission: Secondary | ICD-10-CM | POA: Diagnosis not present

## 2018-03-18 DIAGNOSIS — D631 Anemia in chronic kidney disease: Secondary | ICD-10-CM | POA: Diagnosis not present

## 2018-03-18 DIAGNOSIS — Z6821 Body mass index (BMI) 21.0-21.9, adult: Secondary | ICD-10-CM | POA: Diagnosis not present

## 2018-03-20 DIAGNOSIS — N186 End stage renal disease: Secondary | ICD-10-CM | POA: Diagnosis not present

## 2018-03-20 DIAGNOSIS — N2581 Secondary hyperparathyroidism of renal origin: Secondary | ICD-10-CM | POA: Diagnosis not present

## 2018-03-20 DIAGNOSIS — D472 Monoclonal gammopathy: Secondary | ICD-10-CM | POA: Diagnosis not present

## 2018-03-20 DIAGNOSIS — Z992 Dependence on renal dialysis: Secondary | ICD-10-CM | POA: Diagnosis not present

## 2018-03-20 DIAGNOSIS — D631 Anemia in chronic kidney disease: Secondary | ICD-10-CM | POA: Diagnosis not present

## 2018-03-20 DIAGNOSIS — D509 Iron deficiency anemia, unspecified: Secondary | ICD-10-CM | POA: Diagnosis not present

## 2018-03-23 DIAGNOSIS — Z992 Dependence on renal dialysis: Secondary | ICD-10-CM | POA: Diagnosis not present

## 2018-03-23 DIAGNOSIS — N2581 Secondary hyperparathyroidism of renal origin: Secondary | ICD-10-CM | POA: Diagnosis not present

## 2018-03-23 DIAGNOSIS — D472 Monoclonal gammopathy: Secondary | ICD-10-CM | POA: Diagnosis not present

## 2018-03-23 DIAGNOSIS — N186 End stage renal disease: Secondary | ICD-10-CM | POA: Diagnosis not present

## 2018-03-23 DIAGNOSIS — D631 Anemia in chronic kidney disease: Secondary | ICD-10-CM | POA: Diagnosis not present

## 2018-03-23 DIAGNOSIS — D509 Iron deficiency anemia, unspecified: Secondary | ICD-10-CM | POA: Diagnosis not present

## 2018-03-25 DIAGNOSIS — N186 End stage renal disease: Secondary | ICD-10-CM | POA: Diagnosis not present

## 2018-03-25 DIAGNOSIS — Z992 Dependence on renal dialysis: Secondary | ICD-10-CM | POA: Diagnosis not present

## 2018-03-25 DIAGNOSIS — N2581 Secondary hyperparathyroidism of renal origin: Secondary | ICD-10-CM | POA: Diagnosis not present

## 2018-03-25 DIAGNOSIS — D472 Monoclonal gammopathy: Secondary | ICD-10-CM | POA: Diagnosis not present

## 2018-03-25 DIAGNOSIS — D509 Iron deficiency anemia, unspecified: Secondary | ICD-10-CM | POA: Diagnosis not present

## 2018-03-25 DIAGNOSIS — D631 Anemia in chronic kidney disease: Secondary | ICD-10-CM | POA: Diagnosis not present

## 2018-03-27 DIAGNOSIS — N186 End stage renal disease: Secondary | ICD-10-CM | POA: Diagnosis not present

## 2018-03-27 DIAGNOSIS — Z992 Dependence on renal dialysis: Secondary | ICD-10-CM | POA: Diagnosis not present

## 2018-03-27 DIAGNOSIS — D509 Iron deficiency anemia, unspecified: Secondary | ICD-10-CM | POA: Diagnosis not present

## 2018-03-27 DIAGNOSIS — D472 Monoclonal gammopathy: Secondary | ICD-10-CM | POA: Diagnosis not present

## 2018-03-27 DIAGNOSIS — N2581 Secondary hyperparathyroidism of renal origin: Secondary | ICD-10-CM | POA: Diagnosis not present

## 2018-03-27 DIAGNOSIS — D631 Anemia in chronic kidney disease: Secondary | ICD-10-CM | POA: Diagnosis not present

## 2018-03-30 DIAGNOSIS — Z992 Dependence on renal dialysis: Secondary | ICD-10-CM | POA: Diagnosis not present

## 2018-03-30 DIAGNOSIS — N186 End stage renal disease: Secondary | ICD-10-CM | POA: Diagnosis not present

## 2018-03-30 DIAGNOSIS — D509 Iron deficiency anemia, unspecified: Secondary | ICD-10-CM | POA: Diagnosis not present

## 2018-03-30 DIAGNOSIS — D631 Anemia in chronic kidney disease: Secondary | ICD-10-CM | POA: Diagnosis not present

## 2018-03-30 DIAGNOSIS — N2581 Secondary hyperparathyroidism of renal origin: Secondary | ICD-10-CM | POA: Diagnosis not present

## 2018-03-30 DIAGNOSIS — D472 Monoclonal gammopathy: Secondary | ICD-10-CM | POA: Diagnosis not present

## 2018-04-01 DIAGNOSIS — D631 Anemia in chronic kidney disease: Secondary | ICD-10-CM | POA: Diagnosis not present

## 2018-04-01 DIAGNOSIS — Z992 Dependence on renal dialysis: Secondary | ICD-10-CM | POA: Diagnosis not present

## 2018-04-01 DIAGNOSIS — N186 End stage renal disease: Secondary | ICD-10-CM | POA: Diagnosis not present

## 2018-04-01 DIAGNOSIS — N2581 Secondary hyperparathyroidism of renal origin: Secondary | ICD-10-CM | POA: Diagnosis not present

## 2018-04-01 DIAGNOSIS — D472 Monoclonal gammopathy: Secondary | ICD-10-CM | POA: Diagnosis not present

## 2018-04-01 DIAGNOSIS — D509 Iron deficiency anemia, unspecified: Secondary | ICD-10-CM | POA: Diagnosis not present

## 2018-04-03 DIAGNOSIS — N186 End stage renal disease: Secondary | ICD-10-CM | POA: Diagnosis not present

## 2018-04-03 DIAGNOSIS — D472 Monoclonal gammopathy: Secondary | ICD-10-CM | POA: Diagnosis not present

## 2018-04-03 DIAGNOSIS — D631 Anemia in chronic kidney disease: Secondary | ICD-10-CM | POA: Diagnosis not present

## 2018-04-03 DIAGNOSIS — N2581 Secondary hyperparathyroidism of renal origin: Secondary | ICD-10-CM | POA: Diagnosis not present

## 2018-04-03 DIAGNOSIS — Z992 Dependence on renal dialysis: Secondary | ICD-10-CM | POA: Diagnosis not present

## 2018-04-03 DIAGNOSIS — D509 Iron deficiency anemia, unspecified: Secondary | ICD-10-CM | POA: Diagnosis not present

## 2018-04-06 DIAGNOSIS — D509 Iron deficiency anemia, unspecified: Secondary | ICD-10-CM | POA: Diagnosis not present

## 2018-04-06 DIAGNOSIS — Z992 Dependence on renal dialysis: Secondary | ICD-10-CM | POA: Diagnosis not present

## 2018-04-06 DIAGNOSIS — N186 End stage renal disease: Secondary | ICD-10-CM | POA: Diagnosis not present

## 2018-04-06 DIAGNOSIS — D631 Anemia in chronic kidney disease: Secondary | ICD-10-CM | POA: Diagnosis not present

## 2018-04-06 DIAGNOSIS — Z23 Encounter for immunization: Secondary | ICD-10-CM | POA: Diagnosis not present

## 2018-04-06 DIAGNOSIS — D472 Monoclonal gammopathy: Secondary | ICD-10-CM | POA: Diagnosis not present

## 2018-04-06 DIAGNOSIS — N2581 Secondary hyperparathyroidism of renal origin: Secondary | ICD-10-CM | POA: Diagnosis not present

## 2018-04-08 DIAGNOSIS — N2581 Secondary hyperparathyroidism of renal origin: Secondary | ICD-10-CM | POA: Diagnosis not present

## 2018-04-08 DIAGNOSIS — Z992 Dependence on renal dialysis: Secondary | ICD-10-CM | POA: Diagnosis not present

## 2018-04-08 DIAGNOSIS — D509 Iron deficiency anemia, unspecified: Secondary | ICD-10-CM | POA: Diagnosis not present

## 2018-04-08 DIAGNOSIS — Z23 Encounter for immunization: Secondary | ICD-10-CM | POA: Diagnosis not present

## 2018-04-08 DIAGNOSIS — N186 End stage renal disease: Secondary | ICD-10-CM | POA: Diagnosis not present

## 2018-04-08 DIAGNOSIS — D472 Monoclonal gammopathy: Secondary | ICD-10-CM | POA: Diagnosis not present

## 2018-04-08 DIAGNOSIS — E875 Hyperkalemia: Secondary | ICD-10-CM | POA: Diagnosis not present

## 2018-04-10 DIAGNOSIS — N2581 Secondary hyperparathyroidism of renal origin: Secondary | ICD-10-CM | POA: Diagnosis not present

## 2018-04-10 DIAGNOSIS — D509 Iron deficiency anemia, unspecified: Secondary | ICD-10-CM | POA: Diagnosis not present

## 2018-04-10 DIAGNOSIS — Z992 Dependence on renal dialysis: Secondary | ICD-10-CM | POA: Diagnosis not present

## 2018-04-10 DIAGNOSIS — N186 End stage renal disease: Secondary | ICD-10-CM | POA: Diagnosis not present

## 2018-04-10 DIAGNOSIS — Z23 Encounter for immunization: Secondary | ICD-10-CM | POA: Diagnosis not present

## 2018-04-10 DIAGNOSIS — D472 Monoclonal gammopathy: Secondary | ICD-10-CM | POA: Diagnosis not present

## 2018-04-13 DIAGNOSIS — D509 Iron deficiency anemia, unspecified: Secondary | ICD-10-CM | POA: Diagnosis not present

## 2018-04-13 DIAGNOSIS — D472 Monoclonal gammopathy: Secondary | ICD-10-CM | POA: Diagnosis not present

## 2018-04-13 DIAGNOSIS — N2581 Secondary hyperparathyroidism of renal origin: Secondary | ICD-10-CM | POA: Diagnosis not present

## 2018-04-13 DIAGNOSIS — Z23 Encounter for immunization: Secondary | ICD-10-CM | POA: Diagnosis not present

## 2018-04-13 DIAGNOSIS — Z992 Dependence on renal dialysis: Secondary | ICD-10-CM | POA: Diagnosis not present

## 2018-04-13 DIAGNOSIS — N186 End stage renal disease: Secondary | ICD-10-CM | POA: Diagnosis not present

## 2018-04-15 DIAGNOSIS — Z23 Encounter for immunization: Secondary | ICD-10-CM | POA: Diagnosis not present

## 2018-04-15 DIAGNOSIS — N186 End stage renal disease: Secondary | ICD-10-CM | POA: Diagnosis not present

## 2018-04-15 DIAGNOSIS — D509 Iron deficiency anemia, unspecified: Secondary | ICD-10-CM | POA: Diagnosis not present

## 2018-04-15 DIAGNOSIS — Z992 Dependence on renal dialysis: Secondary | ICD-10-CM | POA: Diagnosis not present

## 2018-04-15 DIAGNOSIS — N2581 Secondary hyperparathyroidism of renal origin: Secondary | ICD-10-CM | POA: Diagnosis not present

## 2018-04-15 DIAGNOSIS — D472 Monoclonal gammopathy: Secondary | ICD-10-CM | POA: Diagnosis not present

## 2018-04-17 DIAGNOSIS — N2581 Secondary hyperparathyroidism of renal origin: Secondary | ICD-10-CM | POA: Diagnosis not present

## 2018-04-17 DIAGNOSIS — Z23 Encounter for immunization: Secondary | ICD-10-CM | POA: Diagnosis not present

## 2018-04-17 DIAGNOSIS — D509 Iron deficiency anemia, unspecified: Secondary | ICD-10-CM | POA: Diagnosis not present

## 2018-04-17 DIAGNOSIS — Z992 Dependence on renal dialysis: Secondary | ICD-10-CM | POA: Diagnosis not present

## 2018-04-17 DIAGNOSIS — D472 Monoclonal gammopathy: Secondary | ICD-10-CM | POA: Diagnosis not present

## 2018-04-17 DIAGNOSIS — N186 End stage renal disease: Secondary | ICD-10-CM | POA: Diagnosis not present

## 2018-04-20 DIAGNOSIS — D472 Monoclonal gammopathy: Secondary | ICD-10-CM | POA: Diagnosis not present

## 2018-04-20 DIAGNOSIS — D509 Iron deficiency anemia, unspecified: Secondary | ICD-10-CM | POA: Diagnosis not present

## 2018-04-20 DIAGNOSIS — Z992 Dependence on renal dialysis: Secondary | ICD-10-CM | POA: Diagnosis not present

## 2018-04-20 DIAGNOSIS — N186 End stage renal disease: Secondary | ICD-10-CM | POA: Diagnosis not present

## 2018-04-20 DIAGNOSIS — N2581 Secondary hyperparathyroidism of renal origin: Secondary | ICD-10-CM | POA: Diagnosis not present

## 2018-04-20 DIAGNOSIS — Z23 Encounter for immunization: Secondary | ICD-10-CM | POA: Diagnosis not present

## 2018-04-22 DIAGNOSIS — N186 End stage renal disease: Secondary | ICD-10-CM | POA: Diagnosis not present

## 2018-04-22 DIAGNOSIS — N2581 Secondary hyperparathyroidism of renal origin: Secondary | ICD-10-CM | POA: Diagnosis not present

## 2018-04-22 DIAGNOSIS — D509 Iron deficiency anemia, unspecified: Secondary | ICD-10-CM | POA: Diagnosis not present

## 2018-04-22 DIAGNOSIS — Z23 Encounter for immunization: Secondary | ICD-10-CM | POA: Diagnosis not present

## 2018-04-22 DIAGNOSIS — D472 Monoclonal gammopathy: Secondary | ICD-10-CM | POA: Diagnosis not present

## 2018-04-22 DIAGNOSIS — Z992 Dependence on renal dialysis: Secondary | ICD-10-CM | POA: Diagnosis not present

## 2018-04-24 DIAGNOSIS — D509 Iron deficiency anemia, unspecified: Secondary | ICD-10-CM | POA: Diagnosis not present

## 2018-04-24 DIAGNOSIS — Z23 Encounter for immunization: Secondary | ICD-10-CM | POA: Diagnosis not present

## 2018-04-24 DIAGNOSIS — N186 End stage renal disease: Secondary | ICD-10-CM | POA: Diagnosis not present

## 2018-04-24 DIAGNOSIS — D472 Monoclonal gammopathy: Secondary | ICD-10-CM | POA: Diagnosis not present

## 2018-04-24 DIAGNOSIS — Z992 Dependence on renal dialysis: Secondary | ICD-10-CM | POA: Diagnosis not present

## 2018-04-24 DIAGNOSIS — N2581 Secondary hyperparathyroidism of renal origin: Secondary | ICD-10-CM | POA: Diagnosis not present

## 2018-04-27 DIAGNOSIS — D472 Monoclonal gammopathy: Secondary | ICD-10-CM | POA: Diagnosis not present

## 2018-04-27 DIAGNOSIS — Z23 Encounter for immunization: Secondary | ICD-10-CM | POA: Diagnosis not present

## 2018-04-27 DIAGNOSIS — D509 Iron deficiency anemia, unspecified: Secondary | ICD-10-CM | POA: Diagnosis not present

## 2018-04-27 DIAGNOSIS — Z992 Dependence on renal dialysis: Secondary | ICD-10-CM | POA: Diagnosis not present

## 2018-04-27 DIAGNOSIS — N2581 Secondary hyperparathyroidism of renal origin: Secondary | ICD-10-CM | POA: Diagnosis not present

## 2018-04-27 DIAGNOSIS — N186 End stage renal disease: Secondary | ICD-10-CM | POA: Diagnosis not present

## 2018-04-29 DIAGNOSIS — N186 End stage renal disease: Secondary | ICD-10-CM | POA: Diagnosis not present

## 2018-04-29 DIAGNOSIS — Z992 Dependence on renal dialysis: Secondary | ICD-10-CM | POA: Diagnosis not present

## 2018-04-29 DIAGNOSIS — D509 Iron deficiency anemia, unspecified: Secondary | ICD-10-CM | POA: Diagnosis not present

## 2018-04-29 DIAGNOSIS — N2581 Secondary hyperparathyroidism of renal origin: Secondary | ICD-10-CM | POA: Diagnosis not present

## 2018-04-29 DIAGNOSIS — Z23 Encounter for immunization: Secondary | ICD-10-CM | POA: Diagnosis not present

## 2018-04-29 DIAGNOSIS — D472 Monoclonal gammopathy: Secondary | ICD-10-CM | POA: Diagnosis not present

## 2018-05-01 DIAGNOSIS — Z23 Encounter for immunization: Secondary | ICD-10-CM | POA: Diagnosis not present

## 2018-05-01 DIAGNOSIS — D472 Monoclonal gammopathy: Secondary | ICD-10-CM | POA: Diagnosis not present

## 2018-05-01 DIAGNOSIS — N186 End stage renal disease: Secondary | ICD-10-CM | POA: Diagnosis not present

## 2018-05-01 DIAGNOSIS — N2581 Secondary hyperparathyroidism of renal origin: Secondary | ICD-10-CM | POA: Diagnosis not present

## 2018-05-01 DIAGNOSIS — Z992 Dependence on renal dialysis: Secondary | ICD-10-CM | POA: Diagnosis not present

## 2018-05-01 DIAGNOSIS — D509 Iron deficiency anemia, unspecified: Secondary | ICD-10-CM | POA: Diagnosis not present

## 2018-05-04 DIAGNOSIS — D509 Iron deficiency anemia, unspecified: Secondary | ICD-10-CM | POA: Diagnosis not present

## 2018-05-04 DIAGNOSIS — N186 End stage renal disease: Secondary | ICD-10-CM | POA: Diagnosis not present

## 2018-05-04 DIAGNOSIS — Z992 Dependence on renal dialysis: Secondary | ICD-10-CM | POA: Diagnosis not present

## 2018-05-04 DIAGNOSIS — D472 Monoclonal gammopathy: Secondary | ICD-10-CM | POA: Diagnosis not present

## 2018-05-04 DIAGNOSIS — Z23 Encounter for immunization: Secondary | ICD-10-CM | POA: Diagnosis not present

## 2018-05-04 DIAGNOSIS — N2581 Secondary hyperparathyroidism of renal origin: Secondary | ICD-10-CM | POA: Diagnosis not present

## 2018-05-06 DIAGNOSIS — D472 Monoclonal gammopathy: Secondary | ICD-10-CM | POA: Diagnosis not present

## 2018-05-06 DIAGNOSIS — Z992 Dependence on renal dialysis: Secondary | ICD-10-CM | POA: Diagnosis not present

## 2018-05-06 DIAGNOSIS — D509 Iron deficiency anemia, unspecified: Secondary | ICD-10-CM | POA: Diagnosis not present

## 2018-05-06 DIAGNOSIS — N186 End stage renal disease: Secondary | ICD-10-CM | POA: Diagnosis not present

## 2018-05-06 DIAGNOSIS — N2581 Secondary hyperparathyroidism of renal origin: Secondary | ICD-10-CM | POA: Diagnosis not present

## 2018-05-06 DIAGNOSIS — D631 Anemia in chronic kidney disease: Secondary | ICD-10-CM | POA: Diagnosis not present

## 2018-05-08 DIAGNOSIS — D509 Iron deficiency anemia, unspecified: Secondary | ICD-10-CM | POA: Diagnosis not present

## 2018-05-08 DIAGNOSIS — N2581 Secondary hyperparathyroidism of renal origin: Secondary | ICD-10-CM | POA: Diagnosis not present

## 2018-05-08 DIAGNOSIS — D472 Monoclonal gammopathy: Secondary | ICD-10-CM | POA: Diagnosis not present

## 2018-05-08 DIAGNOSIS — N186 End stage renal disease: Secondary | ICD-10-CM | POA: Diagnosis not present

## 2018-05-08 DIAGNOSIS — Z992 Dependence on renal dialysis: Secondary | ICD-10-CM | POA: Diagnosis not present

## 2018-05-08 DIAGNOSIS — D631 Anemia in chronic kidney disease: Secondary | ICD-10-CM | POA: Diagnosis not present

## 2018-05-11 DIAGNOSIS — D509 Iron deficiency anemia, unspecified: Secondary | ICD-10-CM | POA: Diagnosis not present

## 2018-05-11 DIAGNOSIS — N186 End stage renal disease: Secondary | ICD-10-CM | POA: Diagnosis not present

## 2018-05-11 DIAGNOSIS — D631 Anemia in chronic kidney disease: Secondary | ICD-10-CM | POA: Diagnosis not present

## 2018-05-11 DIAGNOSIS — D472 Monoclonal gammopathy: Secondary | ICD-10-CM | POA: Diagnosis not present

## 2018-05-11 DIAGNOSIS — N2581 Secondary hyperparathyroidism of renal origin: Secondary | ICD-10-CM | POA: Diagnosis not present

## 2018-05-11 DIAGNOSIS — Z992 Dependence on renal dialysis: Secondary | ICD-10-CM | POA: Diagnosis not present

## 2018-05-13 DIAGNOSIS — D472 Monoclonal gammopathy: Secondary | ICD-10-CM | POA: Diagnosis not present

## 2018-05-13 DIAGNOSIS — N2581 Secondary hyperparathyroidism of renal origin: Secondary | ICD-10-CM | POA: Diagnosis not present

## 2018-05-13 DIAGNOSIS — D509 Iron deficiency anemia, unspecified: Secondary | ICD-10-CM | POA: Diagnosis not present

## 2018-05-13 DIAGNOSIS — N186 End stage renal disease: Secondary | ICD-10-CM | POA: Diagnosis not present

## 2018-05-13 DIAGNOSIS — Z992 Dependence on renal dialysis: Secondary | ICD-10-CM | POA: Diagnosis not present

## 2018-05-13 DIAGNOSIS — D631 Anemia in chronic kidney disease: Secondary | ICD-10-CM | POA: Diagnosis not present

## 2018-05-15 DIAGNOSIS — D509 Iron deficiency anemia, unspecified: Secondary | ICD-10-CM | POA: Diagnosis not present

## 2018-05-15 DIAGNOSIS — Z992 Dependence on renal dialysis: Secondary | ICD-10-CM | POA: Diagnosis not present

## 2018-05-15 DIAGNOSIS — N2581 Secondary hyperparathyroidism of renal origin: Secondary | ICD-10-CM | POA: Diagnosis not present

## 2018-05-15 DIAGNOSIS — D631 Anemia in chronic kidney disease: Secondary | ICD-10-CM | POA: Diagnosis not present

## 2018-05-15 DIAGNOSIS — D472 Monoclonal gammopathy: Secondary | ICD-10-CM | POA: Diagnosis not present

## 2018-05-15 DIAGNOSIS — N186 End stage renal disease: Secondary | ICD-10-CM | POA: Diagnosis not present

## 2018-05-18 DIAGNOSIS — D472 Monoclonal gammopathy: Secondary | ICD-10-CM | POA: Diagnosis not present

## 2018-05-18 DIAGNOSIS — Z992 Dependence on renal dialysis: Secondary | ICD-10-CM | POA: Diagnosis not present

## 2018-05-18 DIAGNOSIS — N186 End stage renal disease: Secondary | ICD-10-CM | POA: Diagnosis not present

## 2018-05-18 DIAGNOSIS — D631 Anemia in chronic kidney disease: Secondary | ICD-10-CM | POA: Diagnosis not present

## 2018-05-18 DIAGNOSIS — D509 Iron deficiency anemia, unspecified: Secondary | ICD-10-CM | POA: Diagnosis not present

## 2018-05-18 DIAGNOSIS — N2581 Secondary hyperparathyroidism of renal origin: Secondary | ICD-10-CM | POA: Diagnosis not present

## 2018-05-20 DIAGNOSIS — N2581 Secondary hyperparathyroidism of renal origin: Secondary | ICD-10-CM | POA: Diagnosis not present

## 2018-05-20 DIAGNOSIS — N186 End stage renal disease: Secondary | ICD-10-CM | POA: Diagnosis not present

## 2018-05-20 DIAGNOSIS — D631 Anemia in chronic kidney disease: Secondary | ICD-10-CM | POA: Diagnosis not present

## 2018-05-20 DIAGNOSIS — D472 Monoclonal gammopathy: Secondary | ICD-10-CM | POA: Diagnosis not present

## 2018-05-20 DIAGNOSIS — Z992 Dependence on renal dialysis: Secondary | ICD-10-CM | POA: Diagnosis not present

## 2018-05-20 DIAGNOSIS — D509 Iron deficiency anemia, unspecified: Secondary | ICD-10-CM | POA: Diagnosis not present

## 2018-05-21 DIAGNOSIS — L84 Corns and callosities: Secondary | ICD-10-CM | POA: Diagnosis not present

## 2018-05-21 DIAGNOSIS — G609 Hereditary and idiopathic neuropathy, unspecified: Secondary | ICD-10-CM | POA: Diagnosis not present

## 2018-05-21 DIAGNOSIS — B351 Tinea unguium: Secondary | ICD-10-CM | POA: Diagnosis not present

## 2018-05-21 DIAGNOSIS — M79676 Pain in unspecified toe(s): Secondary | ICD-10-CM | POA: Diagnosis not present

## 2018-05-22 DIAGNOSIS — N2581 Secondary hyperparathyroidism of renal origin: Secondary | ICD-10-CM | POA: Diagnosis not present

## 2018-05-22 DIAGNOSIS — Z992 Dependence on renal dialysis: Secondary | ICD-10-CM | POA: Diagnosis not present

## 2018-05-22 DIAGNOSIS — D509 Iron deficiency anemia, unspecified: Secondary | ICD-10-CM | POA: Diagnosis not present

## 2018-05-22 DIAGNOSIS — D472 Monoclonal gammopathy: Secondary | ICD-10-CM | POA: Diagnosis not present

## 2018-05-22 DIAGNOSIS — D631 Anemia in chronic kidney disease: Secondary | ICD-10-CM | POA: Diagnosis not present

## 2018-05-22 DIAGNOSIS — N186 End stage renal disease: Secondary | ICD-10-CM | POA: Diagnosis not present

## 2018-05-25 DIAGNOSIS — D509 Iron deficiency anemia, unspecified: Secondary | ICD-10-CM | POA: Diagnosis not present

## 2018-05-25 DIAGNOSIS — N2581 Secondary hyperparathyroidism of renal origin: Secondary | ICD-10-CM | POA: Diagnosis not present

## 2018-05-25 DIAGNOSIS — D631 Anemia in chronic kidney disease: Secondary | ICD-10-CM | POA: Diagnosis not present

## 2018-05-25 DIAGNOSIS — D472 Monoclonal gammopathy: Secondary | ICD-10-CM | POA: Diagnosis not present

## 2018-05-25 DIAGNOSIS — N186 End stage renal disease: Secondary | ICD-10-CM | POA: Diagnosis not present

## 2018-05-25 DIAGNOSIS — Z992 Dependence on renal dialysis: Secondary | ICD-10-CM | POA: Diagnosis not present

## 2018-05-27 DIAGNOSIS — N2581 Secondary hyperparathyroidism of renal origin: Secondary | ICD-10-CM | POA: Diagnosis not present

## 2018-05-27 DIAGNOSIS — D509 Iron deficiency anemia, unspecified: Secondary | ICD-10-CM | POA: Diagnosis not present

## 2018-05-27 DIAGNOSIS — D472 Monoclonal gammopathy: Secondary | ICD-10-CM | POA: Diagnosis not present

## 2018-05-27 DIAGNOSIS — N186 End stage renal disease: Secondary | ICD-10-CM | POA: Diagnosis not present

## 2018-05-27 DIAGNOSIS — D631 Anemia in chronic kidney disease: Secondary | ICD-10-CM | POA: Diagnosis not present

## 2018-05-27 DIAGNOSIS — Z992 Dependence on renal dialysis: Secondary | ICD-10-CM | POA: Diagnosis not present

## 2018-05-29 DIAGNOSIS — D509 Iron deficiency anemia, unspecified: Secondary | ICD-10-CM | POA: Diagnosis not present

## 2018-05-29 DIAGNOSIS — N2581 Secondary hyperparathyroidism of renal origin: Secondary | ICD-10-CM | POA: Diagnosis not present

## 2018-05-29 DIAGNOSIS — D631 Anemia in chronic kidney disease: Secondary | ICD-10-CM | POA: Diagnosis not present

## 2018-05-29 DIAGNOSIS — Z992 Dependence on renal dialysis: Secondary | ICD-10-CM | POA: Diagnosis not present

## 2018-05-29 DIAGNOSIS — N186 End stage renal disease: Secondary | ICD-10-CM | POA: Diagnosis not present

## 2018-05-29 DIAGNOSIS — D472 Monoclonal gammopathy: Secondary | ICD-10-CM | POA: Diagnosis not present

## 2018-06-01 DIAGNOSIS — D509 Iron deficiency anemia, unspecified: Secondary | ICD-10-CM | POA: Diagnosis not present

## 2018-06-01 DIAGNOSIS — Z992 Dependence on renal dialysis: Secondary | ICD-10-CM | POA: Diagnosis not present

## 2018-06-01 DIAGNOSIS — D472 Monoclonal gammopathy: Secondary | ICD-10-CM | POA: Diagnosis not present

## 2018-06-01 DIAGNOSIS — N2581 Secondary hyperparathyroidism of renal origin: Secondary | ICD-10-CM | POA: Diagnosis not present

## 2018-06-01 DIAGNOSIS — N186 End stage renal disease: Secondary | ICD-10-CM | POA: Diagnosis not present

## 2018-06-01 DIAGNOSIS — D631 Anemia in chronic kidney disease: Secondary | ICD-10-CM | POA: Diagnosis not present

## 2018-06-02 ENCOUNTER — Ambulatory Visit: Payer: Medicare Other | Admitting: Cardiovascular Disease

## 2018-06-03 DIAGNOSIS — N2581 Secondary hyperparathyroidism of renal origin: Secondary | ICD-10-CM | POA: Diagnosis not present

## 2018-06-03 DIAGNOSIS — N186 End stage renal disease: Secondary | ICD-10-CM | POA: Diagnosis not present

## 2018-06-03 DIAGNOSIS — D631 Anemia in chronic kidney disease: Secondary | ICD-10-CM | POA: Diagnosis not present

## 2018-06-03 DIAGNOSIS — Z992 Dependence on renal dialysis: Secondary | ICD-10-CM | POA: Diagnosis not present

## 2018-06-03 DIAGNOSIS — D509 Iron deficiency anemia, unspecified: Secondary | ICD-10-CM | POA: Diagnosis not present

## 2018-06-03 DIAGNOSIS — D472 Monoclonal gammopathy: Secondary | ICD-10-CM | POA: Diagnosis not present

## 2018-06-04 DIAGNOSIS — M25512 Pain in left shoulder: Secondary | ICD-10-CM | POA: Diagnosis not present

## 2018-06-04 DIAGNOSIS — I10 Essential (primary) hypertension: Secondary | ICD-10-CM | POA: Diagnosis not present

## 2018-06-04 DIAGNOSIS — Z299 Encounter for prophylactic measures, unspecified: Secondary | ICD-10-CM | POA: Diagnosis not present

## 2018-06-04 DIAGNOSIS — Z992 Dependence on renal dialysis: Secondary | ICD-10-CM | POA: Diagnosis not present

## 2018-06-04 DIAGNOSIS — N186 End stage renal disease: Secondary | ICD-10-CM | POA: Diagnosis not present

## 2018-06-04 DIAGNOSIS — H6120 Impacted cerumen, unspecified ear: Secondary | ICD-10-CM | POA: Diagnosis not present

## 2018-06-05 DIAGNOSIS — N186 End stage renal disease: Secondary | ICD-10-CM | POA: Diagnosis not present

## 2018-06-05 DIAGNOSIS — D509 Iron deficiency anemia, unspecified: Secondary | ICD-10-CM | POA: Diagnosis not present

## 2018-06-05 DIAGNOSIS — D472 Monoclonal gammopathy: Secondary | ICD-10-CM | POA: Diagnosis not present

## 2018-06-05 DIAGNOSIS — N2581 Secondary hyperparathyroidism of renal origin: Secondary | ICD-10-CM | POA: Diagnosis not present

## 2018-06-05 DIAGNOSIS — Z992 Dependence on renal dialysis: Secondary | ICD-10-CM | POA: Diagnosis not present

## 2018-06-08 DIAGNOSIS — D509 Iron deficiency anemia, unspecified: Secondary | ICD-10-CM | POA: Diagnosis not present

## 2018-06-08 DIAGNOSIS — N2581 Secondary hyperparathyroidism of renal origin: Secondary | ICD-10-CM | POA: Diagnosis not present

## 2018-06-08 DIAGNOSIS — N186 End stage renal disease: Secondary | ICD-10-CM | POA: Diagnosis not present

## 2018-06-08 DIAGNOSIS — D472 Monoclonal gammopathy: Secondary | ICD-10-CM | POA: Diagnosis not present

## 2018-06-08 DIAGNOSIS — Z992 Dependence on renal dialysis: Secondary | ICD-10-CM | POA: Diagnosis not present

## 2018-06-10 DIAGNOSIS — D472 Monoclonal gammopathy: Secondary | ICD-10-CM | POA: Diagnosis not present

## 2018-06-10 DIAGNOSIS — D509 Iron deficiency anemia, unspecified: Secondary | ICD-10-CM | POA: Diagnosis not present

## 2018-06-10 DIAGNOSIS — Z992 Dependence on renal dialysis: Secondary | ICD-10-CM | POA: Diagnosis not present

## 2018-06-10 DIAGNOSIS — N186 End stage renal disease: Secondary | ICD-10-CM | POA: Diagnosis not present

## 2018-06-10 DIAGNOSIS — N2581 Secondary hyperparathyroidism of renal origin: Secondary | ICD-10-CM | POA: Diagnosis not present

## 2018-06-11 DIAGNOSIS — E7800 Pure hypercholesterolemia, unspecified: Secondary | ICD-10-CM | POA: Diagnosis not present

## 2018-06-11 DIAGNOSIS — I10 Essential (primary) hypertension: Secondary | ICD-10-CM | POA: Diagnosis not present

## 2018-06-11 DIAGNOSIS — Z299 Encounter for prophylactic measures, unspecified: Secondary | ICD-10-CM | POA: Diagnosis not present

## 2018-06-11 DIAGNOSIS — H6120 Impacted cerumen, unspecified ear: Secondary | ICD-10-CM | POA: Diagnosis not present

## 2018-06-12 DIAGNOSIS — N186 End stage renal disease: Secondary | ICD-10-CM | POA: Diagnosis not present

## 2018-06-12 DIAGNOSIS — N2581 Secondary hyperparathyroidism of renal origin: Secondary | ICD-10-CM | POA: Diagnosis not present

## 2018-06-12 DIAGNOSIS — Z992 Dependence on renal dialysis: Secondary | ICD-10-CM | POA: Diagnosis not present

## 2018-06-12 DIAGNOSIS — D509 Iron deficiency anemia, unspecified: Secondary | ICD-10-CM | POA: Diagnosis not present

## 2018-06-12 DIAGNOSIS — D472 Monoclonal gammopathy: Secondary | ICD-10-CM | POA: Diagnosis not present

## 2018-06-15 DIAGNOSIS — N186 End stage renal disease: Secondary | ICD-10-CM | POA: Diagnosis not present

## 2018-06-15 DIAGNOSIS — Z992 Dependence on renal dialysis: Secondary | ICD-10-CM | POA: Diagnosis not present

## 2018-06-15 DIAGNOSIS — N2581 Secondary hyperparathyroidism of renal origin: Secondary | ICD-10-CM | POA: Diagnosis not present

## 2018-06-15 DIAGNOSIS — D472 Monoclonal gammopathy: Secondary | ICD-10-CM | POA: Diagnosis not present

## 2018-06-15 DIAGNOSIS — D509 Iron deficiency anemia, unspecified: Secondary | ICD-10-CM | POA: Diagnosis not present

## 2018-06-17 DIAGNOSIS — N2581 Secondary hyperparathyroidism of renal origin: Secondary | ICD-10-CM | POA: Diagnosis not present

## 2018-06-17 DIAGNOSIS — Z992 Dependence on renal dialysis: Secondary | ICD-10-CM | POA: Diagnosis not present

## 2018-06-17 DIAGNOSIS — D509 Iron deficiency anemia, unspecified: Secondary | ICD-10-CM | POA: Diagnosis not present

## 2018-06-17 DIAGNOSIS — D472 Monoclonal gammopathy: Secondary | ICD-10-CM | POA: Diagnosis not present

## 2018-06-17 DIAGNOSIS — N186 End stage renal disease: Secondary | ICD-10-CM | POA: Diagnosis not present

## 2018-06-18 ENCOUNTER — Ambulatory Visit (INDEPENDENT_AMBULATORY_CARE_PROVIDER_SITE_OTHER): Payer: Medicare Other

## 2018-06-18 ENCOUNTER — Encounter (INDEPENDENT_AMBULATORY_CARE_PROVIDER_SITE_OTHER): Payer: Self-pay | Admitting: Orthopaedic Surgery

## 2018-06-18 ENCOUNTER — Ambulatory Visit (INDEPENDENT_AMBULATORY_CARE_PROVIDER_SITE_OTHER): Payer: Medicare Other | Admitting: Orthopaedic Surgery

## 2018-06-18 VITALS — BP 111/54 | HR 74 | Ht 73.0 in | Wt 165.0 lb

## 2018-06-18 DIAGNOSIS — M25512 Pain in left shoulder: Secondary | ICD-10-CM

## 2018-06-18 DIAGNOSIS — G8929 Other chronic pain: Secondary | ICD-10-CM | POA: Diagnosis not present

## 2018-06-18 DIAGNOSIS — M19012 Primary osteoarthritis, left shoulder: Secondary | ICD-10-CM | POA: Diagnosis not present

## 2018-06-18 NOTE — Progress Notes (Signed)
Office Visit Note   Patient: Brian Liu           Date of Birth: 12/31/1950           MRN: [id number] Visit Date: 06/18/2018              Requested by: [name], MD [place], [place] [id number] PCP: [name], MD   Assessment & Plan: Visit Diagnoses:  1. Chronic left shoulder pain   2. Primary osteoarthritis, left shoulder     Plan: Intra-articular injection performed from posterior approach.  We discussed with patient and his son is with him options including total shoulder arthroplasty.  He has multiple medical problems and is minimal ambulator and I am not sure how much improvement he would get since he gets short of breath with ambulation.  I could not recheck him again in 1 month.  Follow-Up Instructions: Return in about 5 weeks (around 07/23/2018).   Orders:  Orders Placed This Encounter  Procedures  . XR Shoulder Left   No orders of the defined types were placed in this encounter.     Procedures: No procedures performed   Clinical Data: No additional findings.   Subjective: Chief Complaint  Patient presents with  . Left Shoulder - Pain    HPI patient here for orthopedic evaluation for left shoulder avascular necrosis first documented back on MRI scan 2016 with progressive glenohumeral arthritic changes.  Patient's not been ambulating is in a wheelchair since last year.  He can walk only a short distance gets tired.  He has some problems with his opposite right arm but can get his right arm up over his head.  He has history of renal disease on dialysis, pancreatitis in the past from alcohol ingestion, he is in atrial fibrillation history of pulmonary hypertension and heart failure.  Multiple  Myeloma.  He has been on some Neurontin.  Review of Systems renal failure with chronic dialysis left wrist vascular shunt.  Positive for dyslipidemia diastolic dysfunction alcoholic pancreatitis, hypertension history of tobacco use, pancytopenia.  Moderate  mitral regurg.  Patient also gives a history of gout in the distant past.   Objective: Vital Signs: BP (!) 111/54   Pulse 74   Ht _0  (1.854 m)   Wt 165 lb (74.8 kg)   BMI 21.77 kg/m   Physical Exam  Constitutional: He is oriented to person, place, and time. He appears well-developed.  Chronically ill appearance in wheelchair nonambulator other than short distance.  HENT:  Head: Normocephalic and atraumatic.  Eyes: Pupils are equal, round, and reactive to light. EOM are normal.  Neck: No tracheal deviation present. No thyromegaly present.  Cardiovascular:  Irregular irregular pulse.  Pulmonary/Chest: Effort normal. He has no wheezes.  Abdominal: Soft. Bowel sounds are normal.  Neurological: He is alert and oriented to person, place, and time.  Skin: Skin is warm and dry. Capillary refill takes less than 2 seconds.  Psychiatric: He has a normal mood and affect. His behavior is normal. Judgment and thought content normal.    Ortho Exam get his right hand above his head.  Left shoulder has extreme pain with internal and external rotation.  Pain radiates from his shoulder down over the biceps anteriorly.  No distal biceps migration.  Left wrist vascular access at the radial artery with dialysis shunt.  There is some coolness of the hand interosseous atrophy first dorsal interosseous.  Decreased finger range of motion fingertips are cool  right and left.  Specialty Comments:  No specialty comments available.  Imaging: No results found.   PMFS History: Patient Active Problem List   Diagnosis Date Noted  . Venous stenosis of left upper extremity 04/03/2017  . Elevated troponin   . Dehydration 12/24/2016  . ESRD on hemodialysis ([place]) 12/24/2016  . Nausea vomiting and diarrhea 12/24/2016  . Enteritis 12/04/2016  . Thrush 12/04/2016  . Pancreatitis 12/02/2016  . Hypokalemia 12/02/2016  . Anemia 12/02/2016  . Abdominal pain   . Multiple myeloma ([place]) 04/30/2016  . Alcoholic  pancreatitis 67/07/4579  . Complication of dialysis access insertion 05/17/2014  . ESRD (end stage renal disease) ([place])   . Atrial fibrillation ([place])   . Hypertension   . Pulmonary hypertension ([place])   . Mitral regurgitation   . Tobacco abuse   . Dyslipidemia   . Lower gastrointestinal bleed   . Pancytopenia ([place])   . Ejection fraction   . Diastolic dysfunction   . LVH (left ventricular hypertrophy)    Past Medical History:  Diagnosis Date  . Atrial fibrillation (HCC)    Permanent, Not on Coumadin because of history of lower GI bleed  . Diastolic dysfunction    Restrictive  diastolic filling pattern echo,  February, 2013  . Dyslipidemia   . Ejection fraction    EF 60-65%, echo, 2013  . ESRD (end stage renal disease) ([place])    Dialysis, fistula in left arm  . Hypertension   . Lower gastrointestinal bleed   . LVH (left ventricular hypertrophy)   . 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 Mitral regurgitation    Mild by echo, 2013  . Multiple myeloma ([place]) 04/30/2016  . Pancytopenia ([place])    Dr Ignacia Bayley  . Pulmonary hypertension (HCC)    Severe, PA pressure 65-70 mm of mercury, echo, 2013  . Tobacco abuse     Family History  Problem Relation Age of Onset  . Other Mother        Deceased, 14  . Stomach cancer Father        Deceased, 90  . Kidney failure Son   . Healthy Son     Past Surgical History:  Procedure Laterality Date  . A/V FISTULAGRAM Left 04/03/2017   Procedure: A/V Fistulagram;  Surgeon: Conrad [place], MD;  Location: [facility];  Service: Cardiovascular;  Laterality: Left;  . AV FISTULA PLACEMENT Left 1992   Left arm   . 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 Not on file  Tobacco Use  . Smoking status: Never Smoker  . Smokeless tobacco: Never Used  Substance and Sexual Activity  . Alcohol use: No    Alcohol/week: 0.0 standard drinks  . Drug use: No  . Sexual activity: Not on file

## 2018-06-19 DIAGNOSIS — Z992 Dependence on renal dialysis: Secondary | ICD-10-CM | POA: Diagnosis not present

## 2018-06-19 DIAGNOSIS — N2581 Secondary hyperparathyroidism of renal origin: Secondary | ICD-10-CM | POA: Diagnosis not present

## 2018-06-19 DIAGNOSIS — D509 Iron deficiency anemia, unspecified: Secondary | ICD-10-CM | POA: Diagnosis not present

## 2018-06-19 DIAGNOSIS — N186 End stage renal disease: Secondary | ICD-10-CM | POA: Diagnosis not present

## 2018-06-19 DIAGNOSIS — D472 Monoclonal gammopathy: Secondary | ICD-10-CM | POA: Diagnosis not present

## 2018-06-22 ENCOUNTER — Other Ambulatory Visit: Payer: Self-pay | Admitting: Student

## 2018-06-22 ENCOUNTER — Inpatient Hospital Stay (HOSPITAL_COMMUNITY): Admission: RE | Admit: 2018-06-22 | Payer: Medicare Other | Source: Ambulatory Visit

## 2018-06-22 ENCOUNTER — Other Ambulatory Visit: Payer: Self-pay | Admitting: Radiology

## 2018-06-22 ENCOUNTER — Other Ambulatory Visit (HOSPITAL_COMMUNITY): Payer: Self-pay | Admitting: Nephrology

## 2018-06-22 ENCOUNTER — Other Ambulatory Visit: Payer: Self-pay | Admitting: Neurology

## 2018-06-22 DIAGNOSIS — N186 End stage renal disease: Secondary | ICD-10-CM

## 2018-06-22 DIAGNOSIS — D509 Iron deficiency anemia, unspecified: Secondary | ICD-10-CM | POA: Diagnosis not present

## 2018-06-22 DIAGNOSIS — G5612 Other lesions of median nerve, left upper limb: Secondary | ICD-10-CM

## 2018-06-22 DIAGNOSIS — G63 Polyneuropathy in diseases classified elsewhere: Secondary | ICD-10-CM

## 2018-06-22 DIAGNOSIS — R200 Anesthesia of skin: Secondary | ICD-10-CM

## 2018-06-22 DIAGNOSIS — Z992 Dependence on renal dialysis: Secondary | ICD-10-CM

## 2018-06-22 DIAGNOSIS — D472 Monoclonal gammopathy: Secondary | ICD-10-CM | POA: Diagnosis not present

## 2018-06-22 DIAGNOSIS — N2581 Secondary hyperparathyroidism of renal origin: Secondary | ICD-10-CM | POA: Diagnosis not present

## 2018-06-23 ENCOUNTER — Encounter (HOSPITAL_COMMUNITY): Payer: Self-pay

## 2018-06-23 ENCOUNTER — Other Ambulatory Visit: Payer: Self-pay

## 2018-06-23 ENCOUNTER — Ambulatory Visit (HOSPITAL_COMMUNITY)
Admission: RE | Admit: 2018-06-23 | Discharge: 2018-06-23 | Disposition: A | Discharge status: Home / Self Care | Payer: Medicare Other | Source: Ambulatory Visit | Attending: Nephrology | Admitting: Nephrology

## 2018-06-23 ENCOUNTER — Other Ambulatory Visit (HOSPITAL_COMMUNITY): Payer: Self-pay | Admitting: Nephrology

## 2018-06-23 DIAGNOSIS — I4891 Unspecified atrial fibrillation: Secondary | ICD-10-CM | POA: Insufficient documentation

## 2018-06-23 DIAGNOSIS — Z940 Kidney transplant status: Secondary | ICD-10-CM | POA: Diagnosis not present

## 2018-06-23 DIAGNOSIS — T82868A Thrombosis of vascular prosthetic devices, implants and grafts, initial encounter: Secondary | ICD-10-CM | POA: Diagnosis not present

## 2018-06-23 DIAGNOSIS — Z992 Dependence on renal dialysis: Secondary | ICD-10-CM | POA: Diagnosis not present

## 2018-06-23 DIAGNOSIS — E785 Hyperlipidemia, unspecified: Secondary | ICD-10-CM | POA: Insufficient documentation

## 2018-06-23 DIAGNOSIS — Z9109 Other allergy status, other than to drugs and biological substances: Secondary | ICD-10-CM | POA: Insufficient documentation

## 2018-06-23 DIAGNOSIS — I272 Pulmonary hypertension, unspecified: Secondary | ICD-10-CM | POA: Insufficient documentation

## 2018-06-23 DIAGNOSIS — N186 End stage renal disease: Secondary | ICD-10-CM | POA: Diagnosis not present

## 2018-06-23 DIAGNOSIS — Z8579 Personal history of other malignant neoplasms of lymphoid, hematopoietic and related tissues: Secondary | ICD-10-CM | POA: Diagnosis not present

## 2018-06-23 DIAGNOSIS — T8612 Kidney transplant failure: Secondary | ICD-10-CM | POA: Insufficient documentation

## 2018-06-23 DIAGNOSIS — Z841 Family history of disorders of kidney and ureter: Secondary | ICD-10-CM | POA: Insufficient documentation

## 2018-06-23 DIAGNOSIS — Z9889 Other specified postprocedural states: Secondary | ICD-10-CM | POA: Diagnosis not present

## 2018-06-23 DIAGNOSIS — I120 Hypertensive chronic kidney disease with stage 5 chronic kidney disease or end stage renal disease: Secondary | ICD-10-CM | POA: Diagnosis not present

## 2018-06-23 DIAGNOSIS — T8249XA Other complication of vascular dialysis catheter, initial encounter: Secondary | ICD-10-CM | POA: Diagnosis not present

## 2018-06-23 DIAGNOSIS — Y831 Surgical operation with implant of artificial internal device as the cause of abnormal reaction of the patient, or of later complication, without mention of misadventure at the time of the procedure: Secondary | ICD-10-CM | POA: Insufficient documentation

## 2018-06-23 DIAGNOSIS — G63 Polyneuropathy in diseases classified elsewhere: Secondary | ICD-10-CM

## 2018-06-23 DIAGNOSIS — Z452 Encounter for adjustment and management of vascular access device: Secondary | ICD-10-CM | POA: Diagnosis not present

## 2018-06-23 DIAGNOSIS — G5612 Other lesions of median nerve, left upper limb: Secondary | ICD-10-CM

## 2018-06-23 DIAGNOSIS — R200 Anesthesia of skin: Secondary | ICD-10-CM

## 2018-06-23 DIAGNOSIS — Z79899 Other long term (current) drug therapy: Secondary | ICD-10-CM | POA: Diagnosis not present

## 2018-06-23 DIAGNOSIS — Z808 Family history of malignant neoplasm of other organs or systems: Secondary | ICD-10-CM | POA: Insufficient documentation

## 2018-06-23 HISTORY — PX: IR FLUORO GUIDE CV LINE RIGHT: IMG2283

## 2018-06-23 HISTORY — PX: IR US GUIDE VASC ACCESS RIGHT: IMG2390

## 2018-06-23 LAB — CBC
HEMATOCRIT: 41.3 % (ref 39.0–52.0)
Hemoglobin: 12.8 g/dL — ABNORMAL LOW (ref 13.0–17.0)
MCH: 25.1 pg — AB (ref 26.0–34.0)
MCHC: 31 g/dL (ref 30.0–36.0)
MCV: 81.1 fL (ref 80.0–100.0)
NRBC: 0 % (ref 0.0–0.2)
PLATELETS: 110 10*3/uL — AB (ref 150–400)
RBC: 5.09 MIL/uL (ref 4.22–5.81)
RDW: 13.2 % (ref 11.5–15.5)
WBC: 5.7 10*3/uL (ref 4.0–10.5)

## 2018-06-23 LAB — BASIC METABOLIC PANEL
Anion gap: 15 (ref 5–15)
BUN: 58 mg/dL — ABNORMAL HIGH (ref 8–23)
CALCIUM: 10.7 mg/dL — AB (ref 8.9–10.3)
CHLORIDE: 98 mmol/L (ref 98–111)
CO2: 28 mmol/L (ref 22–32)
Creatinine, Ser: 10.73 mg/dL — ABNORMAL HIGH (ref 0.61–1.24)
GFR calc Af Amer: 5 mL/min — ABNORMAL LOW (ref >60)
GFR calc non Af Amer: 4 mL/min — ABNORMAL LOW (ref >60)
GLUCOSE: 84 mg/dL (ref 70–99)
Potassium: 4 mmol/L (ref 3.5–5.1)
Sodium: 141 mmol/L (ref 135–145)

## 2018-06-23 LAB — PROTIME-INR
INR: 1.09
Prothrombin Time: 14 s (ref 11.4–15.2)

## 2018-06-23 MED ORDER — MIDAZOLAM HCL 2 MG/2ML IJ SOLN
INTRAMUSCULAR | Status: AC
  Filled 2018-06-23: qty 2

## 2018-06-23 MED ORDER — FENTANYL CITRATE (PF) 100 MCG/2ML IJ SOLN
INTRAMUSCULAR | Status: AC | PRN
  Administered 2018-06-23: 50 ug via INTRAVENOUS

## 2018-06-23 MED ORDER — MIDAZOLAM HCL 2 MG/2ML IJ SOLN
INTRAMUSCULAR | Status: AC | PRN
  Administered 2018-06-23: 1 mg via INTRAVENOUS

## 2018-06-23 MED ORDER — HEPARIN SODIUM (PORCINE) 1000 UNIT/ML IJ SOLN
INTRAMUSCULAR | Status: AC
  Filled 2018-06-23: qty 1

## 2018-06-23 MED ORDER — FENTANYL CITRATE (PF) 100 MCG/2ML IJ SOLN
INTRAMUSCULAR | Status: AC
  Filled 2018-06-23: qty 2

## 2018-06-23 MED ORDER — HEPARIN SODIUM (PORCINE) 1000 UNIT/ML IJ SOLN
INTRAMUSCULAR | Status: AC | PRN
  Administered 2018-06-23: 3800 [IU] via INTRAVENOUS

## 2018-06-23 MED ORDER — LIDOCAINE HCL 1 % IJ SOLN
INTRAMUSCULAR | Status: AC
  Filled 2018-06-23: qty 20

## 2018-06-23 MED ORDER — LIDOCAINE HCL 1 % IJ SOLN
INTRAMUSCULAR | Status: AC | PRN
  Administered 2018-06-23: 5 mL

## 2018-06-23 MED ORDER — CEFAZOLIN SODIUM-DEXTROSE 2-4 GM/100ML-% IV SOLN
2.0000 g | INTRAVENOUS | Status: AC
  Administered 2018-06-23: 2 g via INTRAVENOUS

## 2018-06-23 MED ORDER — SODIUM CHLORIDE 0.9 % IV SOLN: INTRAVENOUS | Status: DC

## 2018-06-23 MED ORDER — CEFAZOLIN SODIUM-DEXTROSE 2-4 GM/100ML-% IV SOLN
INTRAVENOUS | Status: AC
  Filled 2018-06-23: qty 100

## 2018-06-23 NOTE — Procedures (Signed)
Interventional Radiology Procedure Note  Procedure: Right IJ tunneled HD catheter placement  Complications: None  Estimated Blood Loss: < 10 mL  Findings: 23 cm tip to cuff length Palindrome catheter placed with tip in RA. OK to use.  Venetia Night. Kathlene Cote, M.D Pager:  [phone]

## 2018-06-23 NOTE — H&P (Signed)
Chief Complaint: Patient was seen in consultation today for tunneled hemodialysis catheter placement at the request of [place]  Referring Physician(s): Fran Lowes  Supervising Physician: Aletta Edouard  Patient Status: [facility] - Out-pt  History of Present Illness: Brian Liu is a 67 y.o. male   Clotted left forearm fistula Last use just Sat Completed dialysis Clotted Sunday Was scheduled for declot vs dialysis catheter placement yesterday Pt unable to keep that appt Now scheduled today for tunneled dialysis catheter placement Imaging was reviewed with Dr Kathlene Cote and Dr Annamaria Boots This is a 67 yr old left forearm fistula Very aneurysmal Both IR doctors feel too old and too riskful to attempt thrombolysis  Will place tunneled dialysis catheter today Brian Liu PAC and pt Brian Liu all aware and agreeable  Past Medical History:  Diagnosis Date  . Atrial fibrillation (HCC)    Permanent, Not on Coumadin because of history of lower GI bleed  . Diastolic dysfunction    Restrictive  diastolic filling pattern echo,  February, 2013  . Dyslipidemia   . Ejection fraction    EF 60-65%, echo, 2013  . ESRD (end stage renal disease) ([place])    Dialysis, fistula in left arm  . Hypertension   . Lower gastrointestinal bleed   . LVH (left ventricular hypertrophy)   . 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 Mitral regurgitation    Mild by echo, 2013  . Multiple myeloma ([place]) 04/30/2016  . Pancytopenia ([place])    Dr Ignacia Bayley  . Pulmonary hypertension (HCC)    Severe, PA pressure 65-70 mm of mercury, echo, 2013  . Tobacco abuse     Past Surgical History:  Procedure Laterality Date  . A/V FISTULAGRAM Left 04/03/2017   Procedure: A/V Fistulagram;  Surgeon: Conrad Adams, MD;  Location: [place] [facility];  Service: Cardiovascular;  Laterality: Left;  . AV FISTULA PLACEMENT Left 1992   Left arm   . 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 Authorizing Provider  cinacalcet (SENSIPAR) 30 MG tablet Take 30 mg by mouth daily.   Yes [provider]  dorzolamide-timolol (COSOPT) 22.3-6.8 MG/ML ophthalmic solution Place 1 drop into both eyes at bedtime.   Yes [provider]  folic acid (FOLVITE) 1 MG tablet Take 1 mg by mouth daily.   Yes [provider]  gabapentin (NEURONTIN) 100 MG capsule Take 200 mg by mouth 3 (three) times daily. 06/14/18  Yes [provider]  lanthanum (FOSRENOL) 500 MG chewable tablet Chew 1,000 mg by mouth 3 (three) times daily with meals. And 1 with snacks   Yes [provider]  midodrine (PROAMATINE) 10 MG tablet Take 5 mg by mouth daily. 04/10/17  Yes [provider]  mirtazapine (REMERON) 7.5 MG tablet Take 7.5 mg by mouth at bedtime. 04/01/17  Yes [provider]  simvastatin (ZOCOR) 20 MG tablet Take 20 mg by mouth daily with supper.     Yes [provider]  VYZULTA 0.024 % SOLN INSTILL 1 DROP INTO EACH EYE ONCE DAILY IN THE EVENING 06/02/18  Yes [provider]  Difluprednate (DUREZOL)  0.05 % EMUL  09/22/16   [provider]     Family History  Problem Relation Age of Onset  . Other Mother        Deceased, 38  . Stomach cancer Father        Deceased, 4  . Kidney failure Son   . Healthy Son     Social History   Socioeconomic History  . Marital status: Widowed    Spouse name: Not on file  . Number of children: Not on file  . Years of education: Not on file  . Highest education level: Not on file  Occupational History    . Not on file  Social Needs  . Financial resource strain: Not on file  . Food insecurity:    Worry: Not on file    Inability: Not on file  . Transportation needs:    Medical: Not on file    Non-medical: Not on file  Tobacco Use  . Smoking status: Never Smoker  . Smokeless tobacco: Never Used  Substance and Sexual Activity  . Alcohol use: No    Alcohol/week: 0.0 standard drinks  . Drug use: No  . Sexual activity: Not on file  Lifestyle  . Physical activity:    Days per week: Not on file    Minutes per session: Not on file  . Stress: Not on file  Relationships  . Social connections:    Talks on phone: Not on file    Gets together: Not on file    Attends religious service: Not on file    Active member of club or organization: Not on file    Attends meetings of clubs or organizations: Not on file    Relationship status: Not on file  Other Topics Concern  . Not on file  Social History Narrative   Lives alone in a one story home.  Has 2 sons.     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     Education high school.     Review of Systems: A 12 point ROS discussed and pertinent positives are indicated in the HPI above.  All other systems are negative.  Review of Systems  Constitutional: Negative for activity change.  Respiratory: Negative for shortness of breath.   Cardiovascular: Negative for chest pain.  Gastrointestinal: Negative for abdominal pain.  Neurological: Negative for weakness.  Psychiatric/Behavioral: Negative for behavioral problems and confusion.    Vital Signs: Pulse 73   Temp (!) 97.2 F (36.2 C)   Ht '6\' 1"'  (1.854 m)   Wt 160 lb (72.6 kg)   SpO2 100%   BMI 21.11 kg/m   Physical Exam  Cardiovascular: Normal rate and regular rhythm.  Pulmonary/Chest: Effort normal and breath sounds normal.  Abdominal: Soft. Bowel sounds are normal.  Musculoskeletal: Normal range of motion.  aneurysmal left forearm fistula No  pulse No thrill  Neurological: He is alert.  Skin: Skin is warm.  Psychiatric: He has a normal mood and affect. His behavior is normal. Judgment and thought content normal.  Vitals reviewed.   Imaging: Xr Shoulder Left  Result Date: 06/18/2018 X-rays demonstrate severe glenohumeral arthritis with some flattening fragmentation of the head and glenoid erosion.  Acromioclavicular degenerative changes also noted. Impression: End-stage left shoulder osteoarthritis.   Labs:  CBC: Recent Labs    06/23/18 1107  WBC 5.7  HGB 12.8*  HCT 41.3  PLT 110*    COAGS: Recent Labs  06/23/18 1107  INR 1.09    BMP: Recent Labs    06/23/18 1107  NA 141  K 4.0  CL 98  CO2 28  GLUCOSE 84  BUN 58*  CALCIUM 10.7*  CREATININE 10.73*  GFRNONAA 4*  GFRAA 5*    LIVER FUNCTION TESTS: No results for input(s): BILITOT, AST, ALT, ALKPHOS, PROT, ALBUMIN in the last 8760 hours.  TUMOR MARKERS: No results for input(s): AFPTM, CEA, CA199, CHROMGRNA in the last 8760 hours.  Assessment and Plan:  Left forearm dialysis fistula clotted Very old- 68 yrs old Very aneurysmal Felt best to NOT move forward with Declot today Will place tunneled hemodialysis catheter Risks and benefits discussed with the patient including, but not limited to bleeding, infection, vascular injury, pneumothorax which may require chest tube placement, air embolism or even death  All of the patient's questions were answered, patient is agreeable to proceed. Consent signed and in chart.   Thank you for this interesting consult.  I greatly enjoyed meeting Brian Liu and look forward to participating in their care.  A copy of this report was sent to the requesting provider on this date.  Electronically Signed: Lavonia Drafts, PA-C 06/23/2018, 12:16 PM   I spent a total of  30 Minutes   in face to face in clinical consultation, greater than 50% of which was counseling/coordinating care for tunneled  dialysis catheter placement

## 2018-06-23 NOTE — Discharge Instructions (Signed)
Tunneled Catheter Insertion, Care After °Refer to this sheet in the next few weeks. These instructions provide you with information about caring for yourself after your procedure. Your health care provider may also give you more specific instructions. Your treatment has been planned according to current medical practices, but problems sometimes occur. Call your health care provider if you have any problems or questions after your procedure. °What can I expect after the procedure? °After the procedure, it is common to have: °· Some mild redness, swelling, and pain around your catheter site. °· A small amount of blood or clear fluid coming from your incisions. ° °Follow these instructions at home: °Incision care °· Check your incision areas every day for signs of infection. Check for: °? More redness, swelling, or pain. °? More fluid or blood. °? Warmth. °? Pus or a bad smell. °· Follow instructions from your health care provider about how to take care of your incisions. Make sure you: °? Wash your hands with soap and water before you change your bandages (dressings). If soap and water are not available, use hand sanitizer. °? Change your dressings as told by your health care provider. Wash the area around your incisions with a germ-killing (antiseptic) solution when you change your dressing, as told by your health care provider. °? Leave stitches (sutures), skin glue, or adhesive strips in place. These skin closures may need to stay in place for 2 weeks or longer. If adhesive strip edges start to loosen and curl up, you may trim the loose edges. Do not remove adhesive strips completely unless your health care provider tells you to do that. °Catheter Care ° °· Wash your hands with soap and water before and after caring for your catheter. If soap and water are not available, use hand sanitizer. °· Keep your catheter site and your dressings clean and dry. °· Apply an antibiotic ointment to your catheter site as told by  your health care provider. °· Flush your catheter as told by your health care provider. This helps prevent it from becoming clogged. °· Do not open the caps on the ends of the catheter. °· Do not pull on your catheter. °· If your catheter is in your arm: °? Avoid wearing tight clothes or tight jewelry on your arm that has the catheter. °? Do not sleep with your head on the arm that has the catheter. °? Do not allow your blood pressure to be taken on the arm that has the catheter. °? Do not allow your blood to be drawn from the arm that has the catheter, except through the catheter itself. °Medicines °· Take over-the-counter and prescription medicines only as told by your health care provider. °· If you were prescribed an antibiotic medicine, take it as told by your health care provider. Do not stop taking the antibiotic even if you start to feel better. °Activity °· Return to your normal activities as told by your health care provider. Ask your health care provider what activities are safe for you. °· Do not lift anything that is heavier than 10 lb (4.5 kg) for 3 weeks or as long as told by your health care provider. °Driving °· Do not drive until your health care provider approves. °· Do not drive or operate heavy machinery while taking prescription pain medicine. °General instructions °· Follow your health care provider's specific instructions for the type of catheter that you have. °· Do not take baths, swim, or use a hot tub until your health   care provider approves. °· Follow instructions from your health care provider about eating or drinking restrictions. °· Wear compression stockings as told by your health care provider. These stockings help to prevent blood clots and reduce swelling in your legs. °· Keep all follow-up visits as told by your health care provider. This is important. °Contact a health care provider if: °· You have more fluid or blood coming from your incisions. °· You have more redness,  swelling, or pain at your incisions or around the area where your catheter is inserted. °· Your incisions feel warm to the touch. °· You feel unusually weak. °· You feel nauseous. °· Your catheter is not working properly. °· You have blood or fluid draining from your catheter. °· You are unable to flush your catheter. °Get help right away if: °· Your catheter breaks. °· A hole develops in your catheter. °· Your catheter comes loose or gets pulled completely out. If this happens, press on your catheter site firmly with your hand or a clean cloth until you get medical help. °· Your catheter becomes blocked. °· You have swelling in your arm, shoulder, neck, or face. °· You develop chest pain. °· You have difficulty breathing. °· You feel dizzy or light-headed. °· You have pus or a bad smell coming from your incisions. °· You have a fever. °· You develop bleeding from your catheter or your insertion site, and your bleeding does not stop. °This information is not intended to replace advice given to you by your health care provider. Make sure you discuss any questions you have with your health care provider. °Document Released: 07/08/2012 Document Revised: 03/24/2016 Document Reviewed: 04/17/2015 °Elsevier Interactive Patient Education © 2018 [employer] ° °

## 2018-06-24 DIAGNOSIS — N186 End stage renal disease: Secondary | ICD-10-CM | POA: Diagnosis not present

## 2018-06-24 DIAGNOSIS — D472 Monoclonal gammopathy: Secondary | ICD-10-CM | POA: Diagnosis not present

## 2018-06-24 DIAGNOSIS — N2581 Secondary hyperparathyroidism of renal origin: Secondary | ICD-10-CM | POA: Diagnosis not present

## 2018-06-24 DIAGNOSIS — D509 Iron deficiency anemia, unspecified: Secondary | ICD-10-CM | POA: Diagnosis not present

## 2018-06-24 DIAGNOSIS — Z992 Dependence on renal dialysis: Secondary | ICD-10-CM | POA: Diagnosis not present

## 2018-06-26 ENCOUNTER — Other Ambulatory Visit: Payer: Self-pay

## 2018-06-26 DIAGNOSIS — Z992 Dependence on renal dialysis: Secondary | ICD-10-CM | POA: Diagnosis not present

## 2018-06-26 DIAGNOSIS — D472 Monoclonal gammopathy: Secondary | ICD-10-CM | POA: Diagnosis not present

## 2018-06-26 DIAGNOSIS — N186 End stage renal disease: Secondary | ICD-10-CM

## 2018-06-26 DIAGNOSIS — N2581 Secondary hyperparathyroidism of renal origin: Secondary | ICD-10-CM | POA: Diagnosis not present

## 2018-06-26 DIAGNOSIS — D509 Iron deficiency anemia, unspecified: Secondary | ICD-10-CM | POA: Diagnosis not present

## 2018-06-29 DIAGNOSIS — Z992 Dependence on renal dialysis: Secondary | ICD-10-CM | POA: Diagnosis not present

## 2018-06-29 DIAGNOSIS — N186 End stage renal disease: Secondary | ICD-10-CM | POA: Diagnosis not present

## 2018-06-29 DIAGNOSIS — D509 Iron deficiency anemia, unspecified: Secondary | ICD-10-CM | POA: Diagnosis not present

## 2018-06-29 DIAGNOSIS — D472 Monoclonal gammopathy: Secondary | ICD-10-CM | POA: Diagnosis not present

## 2018-06-29 DIAGNOSIS — N2581 Secondary hyperparathyroidism of renal origin: Secondary | ICD-10-CM | POA: Diagnosis not present

## 2018-07-01 DIAGNOSIS — D472 Monoclonal gammopathy: Secondary | ICD-10-CM | POA: Diagnosis not present

## 2018-07-01 DIAGNOSIS — Z992 Dependence on renal dialysis: Secondary | ICD-10-CM | POA: Diagnosis not present

## 2018-07-01 DIAGNOSIS — N2581 Secondary hyperparathyroidism of renal origin: Secondary | ICD-10-CM | POA: Diagnosis not present

## 2018-07-01 DIAGNOSIS — N186 End stage renal disease: Secondary | ICD-10-CM | POA: Diagnosis not present

## 2018-07-01 DIAGNOSIS — D509 Iron deficiency anemia, unspecified: Secondary | ICD-10-CM | POA: Diagnosis not present

## 2018-07-03 DIAGNOSIS — N186 End stage renal disease: Secondary | ICD-10-CM | POA: Diagnosis not present

## 2018-07-03 DIAGNOSIS — Z992 Dependence on renal dialysis: Secondary | ICD-10-CM | POA: Diagnosis not present

## 2018-07-03 DIAGNOSIS — D509 Iron deficiency anemia, unspecified: Secondary | ICD-10-CM | POA: Diagnosis not present

## 2018-07-03 DIAGNOSIS — D472 Monoclonal gammopathy: Secondary | ICD-10-CM | POA: Diagnosis not present

## 2018-07-03 DIAGNOSIS — N2581 Secondary hyperparathyroidism of renal origin: Secondary | ICD-10-CM | POA: Diagnosis not present

## 2018-07-04 DIAGNOSIS — Z992 Dependence on renal dialysis: Secondary | ICD-10-CM | POA: Diagnosis not present

## 2018-07-04 DIAGNOSIS — N186 End stage renal disease: Secondary | ICD-10-CM | POA: Diagnosis not present

## 2018-07-06 DIAGNOSIS — D509 Iron deficiency anemia, unspecified: Secondary | ICD-10-CM | POA: Diagnosis not present

## 2018-07-06 DIAGNOSIS — Z992 Dependence on renal dialysis: Secondary | ICD-10-CM | POA: Diagnosis not present

## 2018-07-06 DIAGNOSIS — D472 Monoclonal gammopathy: Secondary | ICD-10-CM | POA: Diagnosis not present

## 2018-07-06 DIAGNOSIS — N186 End stage renal disease: Secondary | ICD-10-CM | POA: Diagnosis not present

## 2018-07-06 DIAGNOSIS — N2581 Secondary hyperparathyroidism of renal origin: Secondary | ICD-10-CM | POA: Diagnosis not present

## 2018-07-08 DIAGNOSIS — Z992 Dependence on renal dialysis: Secondary | ICD-10-CM | POA: Diagnosis not present

## 2018-07-08 DIAGNOSIS — N186 End stage renal disease: Secondary | ICD-10-CM | POA: Diagnosis not present

## 2018-07-08 DIAGNOSIS — D509 Iron deficiency anemia, unspecified: Secondary | ICD-10-CM | POA: Diagnosis not present

## 2018-07-08 DIAGNOSIS — D472 Monoclonal gammopathy: Secondary | ICD-10-CM | POA: Diagnosis not present

## 2018-07-08 DIAGNOSIS — N2581 Secondary hyperparathyroidism of renal origin: Secondary | ICD-10-CM | POA: Diagnosis not present

## 2018-07-10 DIAGNOSIS — R05 Cough: Secondary | ICD-10-CM | POA: Diagnosis not present

## 2018-07-10 DIAGNOSIS — Z79899 Other long term (current) drug therapy: Secondary | ICD-10-CM | POA: Diagnosis not present

## 2018-07-10 DIAGNOSIS — J209 Acute bronchitis, unspecified: Secondary | ICD-10-CM | POA: Diagnosis not present

## 2018-07-10 DIAGNOSIS — J441 Chronic obstructive pulmonary disease with (acute) exacerbation: Secondary | ICD-10-CM | POA: Diagnosis not present

## 2018-07-13 DIAGNOSIS — D509 Iron deficiency anemia, unspecified: Secondary | ICD-10-CM | POA: Diagnosis not present

## 2018-07-13 DIAGNOSIS — Z992 Dependence on renal dialysis: Secondary | ICD-10-CM | POA: Diagnosis not present

## 2018-07-13 DIAGNOSIS — N186 End stage renal disease: Secondary | ICD-10-CM | POA: Diagnosis not present

## 2018-07-13 DIAGNOSIS — N2581 Secondary hyperparathyroidism of renal origin: Secondary | ICD-10-CM | POA: Diagnosis not present

## 2018-07-13 DIAGNOSIS — D472 Monoclonal gammopathy: Secondary | ICD-10-CM | POA: Diagnosis not present

## 2018-07-15 DIAGNOSIS — D472 Monoclonal gammopathy: Secondary | ICD-10-CM | POA: Diagnosis not present

## 2018-07-15 DIAGNOSIS — D509 Iron deficiency anemia, unspecified: Secondary | ICD-10-CM | POA: Diagnosis not present

## 2018-07-15 DIAGNOSIS — N2581 Secondary hyperparathyroidism of renal origin: Secondary | ICD-10-CM | POA: Diagnosis not present

## 2018-07-15 DIAGNOSIS — Z992 Dependence on renal dialysis: Secondary | ICD-10-CM | POA: Diagnosis not present

## 2018-07-15 DIAGNOSIS — N186 End stage renal disease: Secondary | ICD-10-CM | POA: Diagnosis not present

## 2018-07-16 DIAGNOSIS — C9000 Multiple myeloma not having achieved remission: Secondary | ICD-10-CM | POA: Diagnosis not present

## 2018-07-16 DIAGNOSIS — J209 Acute bronchitis, unspecified: Secondary | ICD-10-CM | POA: Diagnosis not present

## 2018-07-16 DIAGNOSIS — J440 Chronic obstructive pulmonary disease with acute lower respiratory infection: Secondary | ICD-10-CM | POA: Diagnosis not present

## 2018-07-16 DIAGNOSIS — I4891 Unspecified atrial fibrillation: Secondary | ICD-10-CM | POA: Diagnosis not present

## 2018-07-16 DIAGNOSIS — Z6821 Body mass index (BMI) 21.0-21.9, adult: Secondary | ICD-10-CM | POA: Diagnosis not present

## 2018-07-16 DIAGNOSIS — Z299 Encounter for prophylactic measures, unspecified: Secondary | ICD-10-CM | POA: Diagnosis not present

## 2018-07-16 DIAGNOSIS — I10 Essential (primary) hypertension: Secondary | ICD-10-CM | POA: Diagnosis not present

## 2018-07-18 DIAGNOSIS — Z992 Dependence on renal dialysis: Secondary | ICD-10-CM | POA: Diagnosis not present

## 2018-07-18 DIAGNOSIS — D472 Monoclonal gammopathy: Secondary | ICD-10-CM | POA: Diagnosis not present

## 2018-07-18 DIAGNOSIS — N186 End stage renal disease: Secondary | ICD-10-CM | POA: Diagnosis not present

## 2018-07-18 DIAGNOSIS — N2581 Secondary hyperparathyroidism of renal origin: Secondary | ICD-10-CM | POA: Diagnosis not present

## 2018-07-18 DIAGNOSIS — D509 Iron deficiency anemia, unspecified: Secondary | ICD-10-CM | POA: Diagnosis not present

## 2018-07-20 DIAGNOSIS — Z992 Dependence on renal dialysis: Secondary | ICD-10-CM | POA: Diagnosis not present

## 2018-07-20 DIAGNOSIS — N186 End stage renal disease: Secondary | ICD-10-CM | POA: Diagnosis not present

## 2018-07-20 DIAGNOSIS — D509 Iron deficiency anemia, unspecified: Secondary | ICD-10-CM | POA: Diagnosis not present

## 2018-07-20 DIAGNOSIS — D472 Monoclonal gammopathy: Secondary | ICD-10-CM | POA: Diagnosis not present

## 2018-07-20 DIAGNOSIS — N2581 Secondary hyperparathyroidism of renal origin: Secondary | ICD-10-CM | POA: Diagnosis not present

## 2018-07-22 DIAGNOSIS — D472 Monoclonal gammopathy: Secondary | ICD-10-CM | POA: Diagnosis not present

## 2018-07-22 DIAGNOSIS — Z992 Dependence on renal dialysis: Secondary | ICD-10-CM | POA: Diagnosis not present

## 2018-07-22 DIAGNOSIS — N186 End stage renal disease: Secondary | ICD-10-CM | POA: Diagnosis not present

## 2018-07-22 DIAGNOSIS — D509 Iron deficiency anemia, unspecified: Secondary | ICD-10-CM | POA: Diagnosis not present

## 2018-07-22 DIAGNOSIS — N2581 Secondary hyperparathyroidism of renal origin: Secondary | ICD-10-CM | POA: Diagnosis not present

## 2018-07-23 ENCOUNTER — Ambulatory Visit (INDEPENDENT_AMBULATORY_CARE_PROVIDER_SITE_OTHER): Payer: Medicare Other | Admitting: Orthopaedic Surgery

## 2018-07-24 DIAGNOSIS — N186 End stage renal disease: Secondary | ICD-10-CM | POA: Diagnosis not present

## 2018-07-24 DIAGNOSIS — D509 Iron deficiency anemia, unspecified: Secondary | ICD-10-CM | POA: Diagnosis not present

## 2018-07-24 DIAGNOSIS — N2581 Secondary hyperparathyroidism of renal origin: Secondary | ICD-10-CM | POA: Diagnosis not present

## 2018-07-24 DIAGNOSIS — Z992 Dependence on renal dialysis: Secondary | ICD-10-CM | POA: Diagnosis not present

## 2018-07-24 DIAGNOSIS — D472 Monoclonal gammopathy: Secondary | ICD-10-CM | POA: Diagnosis not present

## 2018-07-26 DIAGNOSIS — N2581 Secondary hyperparathyroidism of renal origin: Secondary | ICD-10-CM | POA: Diagnosis not present

## 2018-07-26 DIAGNOSIS — N186 End stage renal disease: Secondary | ICD-10-CM | POA: Diagnosis not present

## 2018-07-26 DIAGNOSIS — Z992 Dependence on renal dialysis: Secondary | ICD-10-CM | POA: Diagnosis not present

## 2018-07-26 DIAGNOSIS — D472 Monoclonal gammopathy: Secondary | ICD-10-CM | POA: Diagnosis not present

## 2018-07-26 DIAGNOSIS — D509 Iron deficiency anemia, unspecified: Secondary | ICD-10-CM | POA: Diagnosis not present

## 2018-07-28 ENCOUNTER — Ambulatory Visit: Payer: Medicare Other | Admitting: Vascular Surgery

## 2018-07-28 ENCOUNTER — Other Ambulatory Visit (HOSPITAL_COMMUNITY): Payer: Medicare Other

## 2018-07-28 ENCOUNTER — Encounter (HOSPITAL_COMMUNITY): Payer: Medicare Other

## 2018-07-28 DIAGNOSIS — N186 End stage renal disease: Secondary | ICD-10-CM | POA: Diagnosis not present

## 2018-07-28 DIAGNOSIS — N2581 Secondary hyperparathyroidism of renal origin: Secondary | ICD-10-CM | POA: Diagnosis not present

## 2018-07-28 DIAGNOSIS — Z992 Dependence on renal dialysis: Secondary | ICD-10-CM | POA: Diagnosis not present

## 2018-07-28 DIAGNOSIS — D472 Monoclonal gammopathy: Secondary | ICD-10-CM | POA: Diagnosis not present

## 2018-07-28 DIAGNOSIS — D509 Iron deficiency anemia, unspecified: Secondary | ICD-10-CM | POA: Diagnosis not present

## 2018-07-31 ENCOUNTER — Other Ambulatory Visit: Payer: Self-pay | Admitting: *Deleted

## 2018-07-31 ENCOUNTER — Encounter: Payer: Self-pay | Admitting: Vascular Surgery

## 2018-07-31 ENCOUNTER — Other Ambulatory Visit: Payer: Self-pay

## 2018-07-31 ENCOUNTER — Ambulatory Visit (HOSPITAL_COMMUNITY)
Admission: RE | Admit: 2018-07-31 | Discharge: 2018-07-31 | Disposition: A | Discharge status: Home / Self Care | Payer: Medicare Other | Source: Ambulatory Visit | Attending: Vascular Surgery | Admitting: Vascular Surgery

## 2018-07-31 ENCOUNTER — Encounter: Payer: Self-pay | Admitting: *Deleted

## 2018-07-31 ENCOUNTER — Ambulatory Visit (INDEPENDENT_AMBULATORY_CARE_PROVIDER_SITE_OTHER): Payer: Medicare Other | Admitting: Vascular Surgery

## 2018-07-31 ENCOUNTER — Ambulatory Visit (INDEPENDENT_AMBULATORY_CARE_PROVIDER_SITE_OTHER)
Admission: RE | Admit: 2018-07-31 | Discharge: 2018-07-31 | Disposition: A | Discharge status: Home / Self Care | Payer: Medicare Other | Source: Ambulatory Visit | Attending: Vascular Surgery | Admitting: Vascular Surgery

## 2018-07-31 VITALS — BP 138/86 | HR 68 | Temp 97.2°F | Resp 18 | Ht 73.0 in | Wt 160.0 lb

## 2018-07-31 DIAGNOSIS — Z992 Dependence on renal dialysis: Secondary | ICD-10-CM

## 2018-07-31 DIAGNOSIS — N186 End stage renal disease: Secondary | ICD-10-CM

## 2018-07-31 DIAGNOSIS — N2581 Secondary hyperparathyroidism of renal origin: Secondary | ICD-10-CM | POA: Diagnosis not present

## 2018-07-31 DIAGNOSIS — D509 Iron deficiency anemia, unspecified: Secondary | ICD-10-CM | POA: Diagnosis not present

## 2018-07-31 DIAGNOSIS — D472 Monoclonal gammopathy: Secondary | ICD-10-CM | POA: Diagnosis not present

## 2018-07-31 NOTE — Progress Notes (Signed)
Patient name: Brian Liu MRN: [id number] DOB: 1951/02/16 Sex: male  REASON FOR VISIT: Evaluate for new access  HPI: Brian Liu is a 67 y.o. male with multiple medical comorbidities including atrial fibrillation, end-stage renal disease on hemodialysis that presents for evaluation of access.  According to the patient he has a left radiocephalic fistula that has been in place for over 23 years.  He states he has been using this up until one month ago when he states they were having trouble with flow volumes.  He now has a right IJ tunneled catheter that was placed by IR.  He has never had right arm access.  When asked if his fistula has been evaluated recently with a fistulogram he is not sure.  Past Medical History:  Diagnosis Date  . Atrial fibrillation (HCC)    Permanent, Not on Coumadin because of history of lower GI bleed  . Diastolic dysfunction    Restrictive  diastolic filling pattern echo,  February, 2013  . Dyslipidemia   . Ejection fraction    EF 60-65%, echo, 2013  . ESRD (end stage renal disease) ([place])    Dialysis, fistula in left arm  . Hypertension   . Lower gastrointestinal bleed   . LVH (left ventricular hypertrophy)   . 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 Mitral regurgitation    Mild by echo, 2013  . Multiple myeloma ([place]) 04/30/2016  . Pancytopenia ([place])    Dr Ignacia Bayley  . Pulmonary hypertension (HCC)    Severe, PA pressure 65-70 mm of mercury, echo, 2013  . Tobacco abuse     Past Surgical History:  Procedure Laterality Date  . A/V FISTULAGRAM Left 04/03/2017   Procedure: A/V Fistulagram;  Surgeon: Conrad Foster Brook, MD;  Location: [place] [facility];  Service: Cardiovascular;  Laterality: Left;  . AV FISTULA PLACEMENT Left 1992   Left arm   . IR FLUORO GUIDE CV LINE RIGHT  06/23/2018  . IR US GUIDE VASC ACCESS RIGHT  06/23/2018  . KIDNEY TRANSPLANT     failed transplant after 7 years, placed at [place] Left 04/03/2017   Procedure: PERIPHERAL VASCULAR BALLOON ANGIOPLASTY;  Surgeon: Conrad [place], MD;  Location: [place] [facility];  Service: Cardiovascular;  Laterality: Left;  left arm AV fistula    Family History  Problem Relation Age of Onset  . Other Mother        Deceased, 92  . Stomach cancer Father        Deceased, 89  . Kidney failure Son   . Healthy Son     SOCIAL HISTORY: Social History   Tobacco Use  . Smoking status: Never Smoker  . Smokeless tobacco: Never Used  Substance Use Topics  . Alcohol use: No    Alcohol/week: 0.0 standard drinks    Allergies  Allergen Reactions  . Other     Hydrocodone cough syrup makes him itch    Current Outpatient Medications  Medication Sig Dispense Refill  . cinacalcet (SENSIPAR) 30 MG tablet Take 30 mg by mouth daily.    . Difluprednate (DUREZOL) 0.05 % EMUL     . dorzolamide-timolol (COSOPT) 22.3-6.8 MG/ML ophthalmic solution Place 1 drop into both eyes at bedtime.    . gabapentin (NEURONTIN) 100 MG capsule Take 200 mg by mouth 3 (three) times daily.  6  . lanthanum (FOSRENOL) 500 MG chewable tablet Chew 1,000 mg by mouth 3 (three) times  daily with meals. And 1 with snacks    . midodrine (PROAMATINE) 10 MG tablet Take 5 mg by mouth daily.    . mirtazapine (REMERON) 7.5 MG tablet Take 7.5 mg by mouth at bedtime.    [name] VYZULTA 0.024 % SOLN INSTILL 1 DROP INTO EACH EYE ONCE DAILY IN THE EVENING  5  . folic acid (FOLVITE) 1 MG tablet Take 1 mg by mouth daily.    . simvastatin (ZOCOR) 20 MG tablet Take 20 mg by mouth daily with supper.       No current facility-administered medications for this visit.     REVIEW OF SYSTEMS:  '[X]'  denotes positive finding, '[ ]'  denotes negative finding Cardiac  Comments:  Chest pain or chest pressure:    Shortness of breath upon exertion:    Short of breath when lying flat:    Irregular heart rhythm:        Vascular    Pain in calf, thigh, or hip brought  on by ambulation:    Pain in feet at night that wakes you up from your sleep:     Blood clot in your veins:    Leg swelling:         Pulmonary    Oxygen at home:    Productive cough:     Wheezing:         Neurologic    Sudden weakness in arms or legs:     Sudden numbness in arms or legs:     Sudden onset of difficulty speaking or slurred speech:    Temporary loss of vision in one eye:     Problems with dizziness:         Gastrointestinal    Blood in stool:     Vomited blood:         Genitourinary    Burning when urinating:     Blood in urine:        Psychiatric    Major depression:         Hematologic    Bleeding problems:    Problems with blood clotting too easily:        Skin    Rashes or ulcers:        Constitutional    Fever or chills:      PHYSICAL EXAM: Vitals:   07/31/18 1504  BP: 138/86  Pulse: 68  Resp: 18  Temp: (!) 97.2 F (36.2 C)  SpO2: 97%  Weight: 160 lb (72.6 kg)  Height: '6\' 1"'  (1.854 m)    GENERAL: The patient is a well-nourished male, in no acute distress. The vital signs are documented above. CARDIAC: There is a regular rate and rhythm.  VASCULAR:  Left radiocephalic fistula with palpable thrill and two aneurysms in forearm. Right radial and brachial pulse 1+ palpable Rigjt IJ tunneled catheter PULMONARY: There is good air exchange bilaterally without wheezing or rales. ABDOMEN: Soft and non-tender with normal pitched bowel sounds.  MUSCULOSKELETAL: There are no major deformities or cyanosis. SKIN: There are no ulcers or rashes noted. PSYCHIATRIC: The patient has a normal affect.  DATA:   I independently reviewed his vein mapping and he has no usable superficial vein in the right arm.  He does have a large cephalic vein in the left arm but this is on the side where he has had a previous central stenosis.  Assessment/Plan:  Surprisingly his left radiocephalic fistula still has a good thrill both at the wrist and at the  antecubital fossa.  This is on the same side that he had a central stenosis that Dr. Bridgett Larsson angioplastied last year.  Patient states the access has not been working for the last month or so even though he has a good thrill on exam today.  I have recommended a left upper extremity fistulogram just to completely evaluate the fistula since there is still a thrill and see if there is a central lesion that could be readdressed and potentially salvage the access.  If not I would envision that he would need a right upper arm graft given that he has no usable superficial vein in the right arm and we would be dealing with a central stenosis on the left.  Discussed if his fistulogram is not successful we will plan for right arm graft.   [name], MD [facility] Office: [phone] Pager: [place]

## 2018-08-03 DIAGNOSIS — D472 Monoclonal gammopathy: Secondary | ICD-10-CM | POA: Diagnosis not present

## 2018-08-03 DIAGNOSIS — D509 Iron deficiency anemia, unspecified: Secondary | ICD-10-CM | POA: Diagnosis not present

## 2018-08-03 DIAGNOSIS — N2581 Secondary hyperparathyroidism of renal origin: Secondary | ICD-10-CM | POA: Diagnosis not present

## 2018-08-03 DIAGNOSIS — Z992 Dependence on renal dialysis: Secondary | ICD-10-CM | POA: Diagnosis not present

## 2018-08-03 DIAGNOSIS — N186 End stage renal disease: Secondary | ICD-10-CM | POA: Diagnosis not present

## 2018-08-04 DIAGNOSIS — N186 End stage renal disease: Secondary | ICD-10-CM | POA: Diagnosis not present

## 2018-08-04 DIAGNOSIS — Z992 Dependence on renal dialysis: Secondary | ICD-10-CM | POA: Diagnosis not present

## 2018-08-05 DIAGNOSIS — N2581 Secondary hyperparathyroidism of renal origin: Secondary | ICD-10-CM | POA: Diagnosis not present

## 2018-08-05 DIAGNOSIS — D472 Monoclonal gammopathy: Secondary | ICD-10-CM | POA: Diagnosis not present

## 2018-08-05 DIAGNOSIS — D509 Iron deficiency anemia, unspecified: Secondary | ICD-10-CM | POA: Diagnosis not present

## 2018-08-05 DIAGNOSIS — N186 End stage renal disease: Secondary | ICD-10-CM | POA: Diagnosis not present

## 2018-08-05 DIAGNOSIS — Z992 Dependence on renal dialysis: Secondary | ICD-10-CM | POA: Diagnosis not present

## 2018-08-05 DIAGNOSIS — D631 Anemia in chronic kidney disease: Secondary | ICD-10-CM | POA: Diagnosis not present

## 2018-08-06 ENCOUNTER — Ambulatory Visit (HOSPITAL_COMMUNITY)
Admission: RE | Admit: 2018-08-06 | Discharge: 2018-08-06 | Disposition: A | Discharge status: Home / Self Care | Payer: Medicare Other | Attending: Vascular Surgery | Admitting: Vascular Surgery

## 2018-08-06 ENCOUNTER — Encounter (HOSPITAL_COMMUNITY)
Admission: RE | Disposition: A | Discharge status: Home / Self Care | Payer: Self-pay | Source: Home / Self Care | Attending: Vascular Surgery

## 2018-08-06 ENCOUNTER — Other Ambulatory Visit: Payer: Self-pay

## 2018-08-06 DIAGNOSIS — Z79899 Other long term (current) drug therapy: Secondary | ICD-10-CM | POA: Diagnosis not present

## 2018-08-06 DIAGNOSIS — E785 Hyperlipidemia, unspecified: Secondary | ICD-10-CM | POA: Insufficient documentation

## 2018-08-06 DIAGNOSIS — I4891 Unspecified atrial fibrillation: Secondary | ICD-10-CM | POA: Diagnosis not present

## 2018-08-06 DIAGNOSIS — Z940 Kidney transplant status: Secondary | ICD-10-CM | POA: Diagnosis not present

## 2018-08-06 DIAGNOSIS — Z955 Presence of coronary angioplasty implant and graft: Secondary | ICD-10-CM | POA: Insufficient documentation

## 2018-08-06 DIAGNOSIS — I120 Hypertensive chronic kidney disease with stage 5 chronic kidney disease or end stage renal disease: Secondary | ICD-10-CM | POA: Diagnosis not present

## 2018-08-06 DIAGNOSIS — N186 End stage renal disease: Secondary | ICD-10-CM | POA: Insufficient documentation

## 2018-08-06 DIAGNOSIS — T82858A Stenosis of vascular prosthetic devices, implants and grafts, initial encounter: Secondary | ICD-10-CM | POA: Diagnosis not present

## 2018-08-06 DIAGNOSIS — Z841 Family history of disorders of kidney and ureter: Secondary | ICD-10-CM | POA: Diagnosis not present

## 2018-08-06 DIAGNOSIS — Y832 Surgical operation with anastomosis, bypass or graft as the cause of abnormal reaction of the patient, or of later complication, without mention of misadventure at the time of the procedure: Secondary | ICD-10-CM | POA: Insufficient documentation

## 2018-08-06 DIAGNOSIS — I272 Pulmonary hypertension, unspecified: Secondary | ICD-10-CM | POA: Diagnosis not present

## 2018-08-06 DIAGNOSIS — Z885 Allergy status to narcotic agent status: Secondary | ICD-10-CM | POA: Insufficient documentation

## 2018-08-06 DIAGNOSIS — Z992 Dependence on renal dialysis: Secondary | ICD-10-CM | POA: Insufficient documentation

## 2018-08-06 DIAGNOSIS — I771 Stricture of artery: Secondary | ICD-10-CM | POA: Diagnosis not present

## 2018-08-06 HISTORY — PX: PERIPHERAL VASCULAR BALLOON ANGIOPLASTY: CATH118281

## 2018-08-06 HISTORY — PX: A/V FISTULAGRAM: CATH118298

## 2018-08-06 LAB — POCT I-STAT, CHEM 8
BUN: 14 mg/dL (ref 8–23)
BUN: 14 mg/dL (ref 8–23)
CALCIUM ION: 1.12 mmol/L — AB (ref 1.15–1.40)
CALCIUM ION: 1.14 mmol/L — AB (ref 1.15–1.40)
Chloride: 98 mmol/L (ref 98–111)
Chloride: 98 mmol/L (ref 98–111)
Creatinine, Ser: 5.2 mg/dL — ABNORMAL HIGH (ref 0.61–1.24)
Creatinine, Ser: 5.3 mg/dL — ABNORMAL HIGH (ref 0.61–1.24)
Glucose, Bld: 81 mg/dL (ref 70–99)
Glucose, Bld: 84 mg/dL (ref 70–99)
HCT: 34 % — ABNORMAL LOW (ref 39.0–52.0)
HCT: 34 % — ABNORMAL LOW (ref 39.0–52.0)
Hemoglobin: 11.6 g/dL — ABNORMAL LOW (ref 13.0–17.0)
Hemoglobin: 11.6 g/dL — ABNORMAL LOW (ref 13.0–17.0)
Potassium: 2.6 mmol/L — CL (ref 3.5–5.1)
Potassium: 2.7 mmol/L — CL (ref 3.5–5.1)
Sodium: 139 mmol/L (ref 135–145)
Sodium: 139 mmol/L (ref 135–145)
TCO2: 28 mmol/L (ref 22–32)
TCO2: 28 mmol/L (ref 22–32)

## 2018-08-06 SURGERY — A/V FISTULAGRAM
Anesthesia: LOCAL | Laterality: Left

## 2018-08-06 MED ORDER — SODIUM CHLORIDE 0.9% FLUSH: 3.0000 mL | INTRAVENOUS | Status: DC | PRN

## 2018-08-06 MED ORDER — HEPARIN (PORCINE) IN NACL 1000-0.9 UT/500ML-% IV SOLN
INTRAVENOUS | Status: DC | PRN
  Administered 2018-08-06: 500 mL

## 2018-08-06 MED ORDER — LIDOCAINE HCL (PF) 1 % IJ SOLN
INTRAMUSCULAR | Status: DC | PRN
  Administered 2018-08-06: 5 mL

## 2018-08-06 MED ORDER — IODIXANOL 320 MG/ML IV SOLN
INTRAVENOUS | Status: DC | PRN
  Administered 2018-08-06: 45 mL via INTRAVENOUS

## 2018-08-06 MED ORDER — SODIUM CHLORIDE 0.9% FLUSH: 3.0000 mL | Freq: Two times a day (BID) | INTRAVENOUS | Status: DC

## 2018-08-06 MED ORDER — SODIUM CHLORIDE 0.9 % IV SOLN: 250.0000 mL | INTRAVENOUS | Status: DC | PRN

## 2018-08-06 MED ORDER — HEPARIN SODIUM (PORCINE) 1000 UNIT/ML IJ SOLN
INTRAMUSCULAR | Status: DC | PRN
  Administered 2018-08-06: 3000 [IU] via INTRAVENOUS

## 2018-08-06 SURGICAL SUPPLY — 18 items
BAG SNAP BAND KOVER 36X36 (MISCELLANEOUS) ×2 IMPLANT
BALLN LUTONIX AV 12X40X75 (BALLOONS) ×2
BALLN MUSTANG 10.0X40 75 (BALLOONS) ×2
BALLOON LUTONIX AV 12X40X75 (BALLOONS) IMPLANT
BALLOON MUSTANG 10.0X40 75 (BALLOONS) IMPLANT
CATH BEACON 5 .035 65 KMP TIP (CATHETERS) ×1 IMPLANT
COVER DOME SNAP 22 D (MISCELLANEOUS) ×2 IMPLANT
KIT ENCORE 26 ADVANTAGE (KITS) ×1 IMPLANT
KIT MICROPUNCTURE NIT STIFF (SHEATH) ×1 IMPLANT
PROTECTION STATION PRESSURIZED (MISCELLANEOUS) ×2
SHEATH PINNACLE 9F 10CM (SHEATH) ×1 IMPLANT
SHEATH PROBE COVER 6X72 (BAG) ×3 IMPLANT
STATION PROTECTION PRESSURIZED (MISCELLANEOUS) ×1 IMPLANT
STOPCOCK MORSE 400PSI 3WAY (MISCELLANEOUS) ×2 IMPLANT
TRAY PV CATH (CUSTOM PROCEDURE TRAY) ×2 IMPLANT
TUBING CIL FLEX 10 FLL-RA (TUBING) ×2 IMPLANT
WIRE BENTSON .035X145CM (WIRE) ×1 IMPLANT
WIRE ROSEN-J .035X180CM (WIRE) ×1 IMPLANT

## 2018-08-06 NOTE — Op Note (Addendum)
    OPERATIVE NOTE   PROCEDURE: 1. left arm radiocephalic arteriovenous cannulation under ultrasound guidance 2. left arm fistulogram including central venogram 3. 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 Patent left radiocephalic arteriovenous fistula with two moderate size aneurysms.  Fistula drains into basilic/brachial veins in addition to emptying into cephalic arch.  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  SPECIMEN(S):  None  CONTRAST: 45 cc  INDICATIONS: Brian Liu is a 68 y.o. male who  presents with malfunctioning left arm radiocephalic arteriovenous fistula.  The patient is scheduled for left arm fistulogram.  The patient is aware the risks include but are not limited to: bleeding, infection, thrombosis of the cannulated access, and possible anaphylactic reaction to the contrast.  The patient is aware of the risks of the procedure and elects to proceed forward.  DESCRIPTION: After full informed written consent was obtained, the patient was brought back to the angiography suite and placed supine upon the angiography table.  The patient was connected to monitoring equipment.  The left arm was prepped and draped in the standard fashion for a left arm fistulogram.  Under ultrasound guidance, the left radiocepahlic arteriovenous fistula was cannulated with a micropuncture needle.  The fistula was patent and an image was saved.  The microwire was advanced into the fistula and the needle was exchanged for the a microsheath, which was lodged 2 cm into the access.  The wire was removed and the sheath was connected to the IV extension  tubing.  Hand injections were completed to image the access from the wrist up to the level of axilla.  The central venous structures were also imaged by hand injections.  Based on the images, this patient will need: intervention for central venous stenosis.  Subsequently used a Bentson wire and exchanged for a 9 French sheath in the left forearm fistula.  Patient was given 3000 units of IV heparin at that time.  Then used a KMP catheter and a Bentson wire and ultimately cannulated through the cephalic vein into the arch and across the subclavian vein stenosis.  Initially used a 10 mm x 40 mm Mustang that was inflated across the stenosis to nominal pressure for 2 minutes.  Then selected a 12 mm x 40 mm drug-coated the tonics that was inflated across the stenosis abdominal pressure for 3 minutes.  A final injection through the sheath showed less than 30% residual stenosis with a good thrill in the fistula.  A 4-0 Monocryl purse-string suture was sewn around the sheath.  The sheath was removed while tying down the suture.  A sterile bandage was applied to the puncture site.  COMPLICATIONS: None  CONDITION: Stable  Plan: Discussed with the patient that seems reasonable to try and use his fistula again now to we have intervened on a central stenosis.  If he continues to have malfunction of his left radiocephalic, I would favor going to the right arm where he has no superficial vein that is usable and would likely need a upper arm graft.  [name], MD [facility] Office: [phone] Pager: [phone]  08/06/2018 11:25 AM

## 2018-08-06 NOTE — H&P (Signed)
History and Physical Interval Note:  08/06/2018 9:55 AM  Brian Liu  has presented today for surgery, with the diagnosis of pvd  The various methods of treatment have been discussed with the patient and family. After consideration of risks, benefits and other options for treatment, the patient has consented to  Procedure(s): A/V FISTULAGRAM (N/A) as a surgical intervention .  The patient's history has been reviewed, patient examined, no change in status, stable for surgery.  I have reviewed the patient's chart and labs.  Questions were answered to the patient's satisfaction.    Left upper extremity fistulogram.  [name]  Patient name: Brian Liu       MRN: [id number]        DOB: 01-07-1951            Sex: male  REASON FOR VISIT: Evaluate for new access  HPI: Brian Liu is a 68 y.o. male with multiple medical comorbidities including atrial fibrillation, end-stage renal disease on hemodialysis that presents for evaluation of access.  According to the patient he has a left radiocephalic fistula that has been in place for over 23 years.  He states he has been using this up until one month ago when he states they were having trouble with flow volumes.  He now has a right IJ tunneled catheter that was placed by IR.  He has never had right arm access.  When asked if his fistula has been evaluated recently with a fistulogram he is not sure.      Past Medical History:  Diagnosis Date  . Atrial fibrillation (HCC)    Permanent, Not on Coumadin because of history of lower GI bleed  . Diastolic dysfunction    Restrictive  diastolic filling pattern echo,  February, 2013  . Dyslipidemia   . Ejection fraction    EF 60-65%, echo, 2013  . ESRD (end stage renal disease) ([place])    Dialysis, fistula in left arm  . Hypertension   . Lower gastrointestinal bleed   . LVH (left ventricular hypertrophy)   . MGUS (monoclonal gammopathy of unknown significance) 04/30/2016   . Mitral regurgitation    Mild by echo, 2013  . Multiple myeloma ([place]) 04/30/2016  . Pancytopenia ([place])    Dr Ignacia Bayley  . Pulmonary hypertension (HCC)    Severe, PA pressure 65-70 mm of mercury, echo, 2013  . Tobacco abuse          Past Surgical History:  Procedure Laterality Date  . A/V FISTULAGRAM Left 04/03/2017   Procedure: A/V Fistulagram;  Surgeon: Conrad [place], MD;  Location: [facility];  Service: Cardiovascular;  Laterality: Left;  . AV FISTULA PLACEMENT Left 1992   Left arm   . IR FLUORO GUIDE CV LINE RIGHT  06/23/2018  . IR US GUIDE VASC ACCESS RIGHT  06/23/2018  . 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 Other Mother        Deceased, 46  . Stomach cancer Father        Deceased, 22  . Kidney failure Son   . Healthy Son  SOCIAL HISTORY: Social History        Tobacco Use  . Smoking status: Never Smoker  . Smokeless tobacco: Never Used  Substance Use Topics  . Alcohol use: No    Alcohol/week: 0.0 standard drinks         Allergies  Allergen Reactions  . Other     Hydrocodone cough syrup makes him itch          Current Outpatient Medications  Medication Sig Dispense Refill  . cinacalcet (SENSIPAR) 30 MG tablet Take 30 mg by mouth daily.    . Difluprednate (DUREZOL) 0.05 % EMUL     . dorzolamide-timolol (COSOPT) 22.3-6.8 MG/ML ophthalmic solution Place 1 drop into both eyes at bedtime.    . gabapentin (NEURONTIN) 100 MG capsule Take 200 mg by mouth 3 (three) times daily.  6  . lanthanum (FOSRENOL) 500 MG chewable tablet Chew 1,000 mg by mouth 3 (three) times daily  with meals. And 1 with snacks    . midodrine (PROAMATINE) 10 MG tablet Take 5 mg by mouth daily.    . mirtazapine (REMERON) 7.5 MG tablet Take 7.5 mg by mouth at bedtime.    [name] VYZULTA 0.024 % SOLN INSTILL 1 DROP INTO EACH EYE ONCE DAILY IN THE EVENING  5  . folic acid (FOLVITE) 1 MG tablet Take 1 mg by mouth daily.    . simvastatin (ZOCOR) 20 MG tablet Take 20 mg by mouth daily with supper.       No current facility-administered medications for this visit.     REVIEW OF SYSTEMS:  '[X]'  denotes positive finding, '[ ]'  denotes negative finding Cardiac  Comments:  Chest pain or chest pressure:    Shortness of breath upon exertion:    Short of breath when lying flat:    Irregular heart rhythm:        Vascular    Pain in calf, thigh, or hip brought on by ambulation:    Pain in feet at night that wakes you up from your sleep:     Blood clot in your veins:    Leg swelling:         Pulmonary    Oxygen at home:    Productive cough:     Wheezing:         Neurologic    Sudden weakness in arms or legs:     Sudden numbness in arms or legs:     Sudden onset of difficulty speaking or slurred speech:    Temporary loss of vision in one eye:     Problems with dizziness:         Gastrointestinal    Blood in stool:     Vomited blood:         Genitourinary    Burning when urinating:     Blood in urine:        Psychiatric    Major depression:         Hematologic    Bleeding problems:    Problems with blood clotting too easily:        Skin    Rashes or ulcers:        Constitutional    Fever or chills:      PHYSICAL EXAM:    Vitals:   07/31/18 1504  BP: 138/86  Pulse: 68  Resp: 18  Temp: (!) 97.2 F (36.2 C)  SpO2: 97%  Weight: 160 lb (72.6 kg)  Height: '6\' 1"'  (1.854 m)  GENERAL: The patient is a well-nourished male, in no acute distress. The vital signs are  documented above. CARDIAC: There is a regular rate and rhythm.  VASCULAR:  Left radiocephalic fistula with palpable thrill and two aneurysms in forearm. Right radial and brachial pulse 1+ palpable Rigjt IJ tunneled catheter PULMONARY: There is good air exchange bilaterally without wheezing or rales. ABDOMEN: Soft and non-tender with normal pitched bowel sounds.  MUSCULOSKELETAL: There are no major deformities or cyanosis. SKIN: There are no ulcers or rashes noted. PSYCHIATRIC: The patient has a normal affect.  DATA:   I independently reviewed his vein mapping and he has no usable superficial vein in the right arm.  He does have a large cephalic vein in the left arm but this is on the side where he has had a previous central stenosis.  Assessment/Plan:  Surprisingly his left radiocephalic fistula still has a good thrill both at the wrist and at the antecubital fossa.  This is on the same side that he had a central stenosis that Dr. Bridgett Larsson angioplastied last year.  Patient states the access has not been working for the last month or so even though he has a good thrill on exam today.  I have recommended a left upper extremity fistulogram just to completely evaluate the fistula since there is still a thrill and see if there is a central lesion that could be readdressed and potentially salvage the access.  If not I would envision that he would need a right upper arm graft given that he has no usable superficial vein in the right arm and we would be dealing with a central stenosis on the left.  Discussed if his fistulogram is not successful we will plan for right arm graft.   [name], MD [facility] Office: [phone] Pager: [place]

## 2018-08-06 NOTE — Discharge Instructions (Signed)

## 2018-08-06 NOTE — Progress Notes (Signed)
Dr Carlis Abbott called order received for discharge.

## 2018-08-06 NOTE — Progress Notes (Signed)
Notified Dr Carlis Abbott K is 2.6, no new orders.

## 2018-08-07 ENCOUNTER — Encounter (HOSPITAL_COMMUNITY): Payer: Self-pay | Admitting: Vascular Surgery

## 2018-08-07 ENCOUNTER — Ambulatory Visit: Payer: Medicare Other | Admitting: Cardiovascular Disease

## 2018-08-07 DIAGNOSIS — N2581 Secondary hyperparathyroidism of renal origin: Secondary | ICD-10-CM | POA: Diagnosis not present

## 2018-08-07 DIAGNOSIS — Z992 Dependence on renal dialysis: Secondary | ICD-10-CM | POA: Diagnosis not present

## 2018-08-07 DIAGNOSIS — D509 Iron deficiency anemia, unspecified: Secondary | ICD-10-CM | POA: Diagnosis not present

## 2018-08-07 DIAGNOSIS — N186 End stage renal disease: Secondary | ICD-10-CM | POA: Diagnosis not present

## 2018-08-07 DIAGNOSIS — D631 Anemia in chronic kidney disease: Secondary | ICD-10-CM | POA: Diagnosis not present

## 2018-08-07 DIAGNOSIS — D472 Monoclonal gammopathy: Secondary | ICD-10-CM | POA: Diagnosis not present

## 2018-08-10 DIAGNOSIS — D472 Monoclonal gammopathy: Secondary | ICD-10-CM | POA: Diagnosis not present

## 2018-08-10 DIAGNOSIS — N2581 Secondary hyperparathyroidism of renal origin: Secondary | ICD-10-CM | POA: Diagnosis not present

## 2018-08-10 DIAGNOSIS — Z992 Dependence on renal dialysis: Secondary | ICD-10-CM | POA: Diagnosis not present

## 2018-08-10 DIAGNOSIS — N186 End stage renal disease: Secondary | ICD-10-CM | POA: Diagnosis not present

## 2018-08-10 DIAGNOSIS — D631 Anemia in chronic kidney disease: Secondary | ICD-10-CM | POA: Diagnosis not present

## 2018-08-10 DIAGNOSIS — D509 Iron deficiency anemia, unspecified: Secondary | ICD-10-CM | POA: Diagnosis not present

## 2018-08-12 DIAGNOSIS — N2581 Secondary hyperparathyroidism of renal origin: Secondary | ICD-10-CM | POA: Diagnosis not present

## 2018-08-12 DIAGNOSIS — Z992 Dependence on renal dialysis: Secondary | ICD-10-CM | POA: Diagnosis not present

## 2018-08-12 DIAGNOSIS — N186 End stage renal disease: Secondary | ICD-10-CM | POA: Diagnosis not present

## 2018-08-12 DIAGNOSIS — D472 Monoclonal gammopathy: Secondary | ICD-10-CM | POA: Diagnosis not present

## 2018-08-12 DIAGNOSIS — D509 Iron deficiency anemia, unspecified: Secondary | ICD-10-CM | POA: Diagnosis not present

## 2018-08-12 DIAGNOSIS — D631 Anemia in chronic kidney disease: Secondary | ICD-10-CM | POA: Diagnosis not present

## 2018-08-14 ENCOUNTER — Other Ambulatory Visit (HOSPITAL_COMMUNITY): Payer: Self-pay | Admitting: Nephrology

## 2018-08-14 DIAGNOSIS — N186 End stage renal disease: Secondary | ICD-10-CM | POA: Diagnosis not present

## 2018-08-14 DIAGNOSIS — Z992 Dependence on renal dialysis: Secondary | ICD-10-CM | POA: Diagnosis not present

## 2018-08-14 DIAGNOSIS — D472 Monoclonal gammopathy: Secondary | ICD-10-CM | POA: Diagnosis not present

## 2018-08-14 DIAGNOSIS — N2581 Secondary hyperparathyroidism of renal origin: Secondary | ICD-10-CM | POA: Diagnosis not present

## 2018-08-14 DIAGNOSIS — D509 Iron deficiency anemia, unspecified: Secondary | ICD-10-CM | POA: Diagnosis not present

## 2018-08-14 DIAGNOSIS — D631 Anemia in chronic kidney disease: Secondary | ICD-10-CM | POA: Diagnosis not present

## 2018-08-17 DIAGNOSIS — D631 Anemia in chronic kidney disease: Secondary | ICD-10-CM | POA: Diagnosis not present

## 2018-08-17 DIAGNOSIS — N2581 Secondary hyperparathyroidism of renal origin: Secondary | ICD-10-CM | POA: Diagnosis not present

## 2018-08-17 DIAGNOSIS — D509 Iron deficiency anemia, unspecified: Secondary | ICD-10-CM | POA: Diagnosis not present

## 2018-08-17 DIAGNOSIS — D472 Monoclonal gammopathy: Secondary | ICD-10-CM | POA: Diagnosis not present

## 2018-08-17 DIAGNOSIS — Z992 Dependence on renal dialysis: Secondary | ICD-10-CM | POA: Diagnosis not present

## 2018-08-17 DIAGNOSIS — N186 End stage renal disease: Secondary | ICD-10-CM | POA: Diagnosis not present

## 2018-08-19 DIAGNOSIS — D509 Iron deficiency anemia, unspecified: Secondary | ICD-10-CM | POA: Diagnosis not present

## 2018-08-19 DIAGNOSIS — D472 Monoclonal gammopathy: Secondary | ICD-10-CM | POA: Diagnosis not present

## 2018-08-19 DIAGNOSIS — N186 End stage renal disease: Secondary | ICD-10-CM | POA: Diagnosis not present

## 2018-08-19 DIAGNOSIS — Z992 Dependence on renal dialysis: Secondary | ICD-10-CM | POA: Diagnosis not present

## 2018-08-19 DIAGNOSIS — N2581 Secondary hyperparathyroidism of renal origin: Secondary | ICD-10-CM | POA: Diagnosis not present

## 2018-08-19 DIAGNOSIS — D631 Anemia in chronic kidney disease: Secondary | ICD-10-CM | POA: Diagnosis not present

## 2018-08-20 ENCOUNTER — Encounter (HOSPITAL_COMMUNITY): Payer: Self-pay | Admitting: Physician Assistant

## 2018-08-20 ENCOUNTER — Ambulatory Visit (HOSPITAL_COMMUNITY): Payer: Medicare Other

## 2018-08-20 ENCOUNTER — Ambulatory Visit (HOSPITAL_COMMUNITY)
Admission: RE | Admit: 2018-08-20 | Discharge: 2018-08-20 | Disposition: A | Discharge status: Home / Self Care | Payer: Medicare Other | Source: Ambulatory Visit | Attending: Nephrology | Admitting: Nephrology

## 2018-08-20 DIAGNOSIS — N186 End stage renal disease: Secondary | ICD-10-CM | POA: Insufficient documentation

## 2018-08-20 DIAGNOSIS — Z452 Encounter for adjustment and management of vascular access device: Secondary | ICD-10-CM | POA: Diagnosis not present

## 2018-08-20 DIAGNOSIS — H401133 Primary open-angle glaucoma, bilateral, severe stage: Secondary | ICD-10-CM | POA: Diagnosis not present

## 2018-08-20 DIAGNOSIS — Z4901 Encounter for fitting and adjustment of extracorporeal dialysis catheter: Secondary | ICD-10-CM | POA: Diagnosis not present

## 2018-08-20 DIAGNOSIS — Z992 Dependence on renal dialysis: Secondary | ICD-10-CM | POA: Diagnosis not present

## 2018-08-20 HISTORY — PX: IR REMOVAL TUN CV CATH W/O FL: IMG2289

## 2018-08-20 MED ORDER — CHLORHEXIDINE GLUCONATE 4 % EX LIQD
CUTANEOUS | Status: AC
  Filled 2018-08-20: qty 15

## 2018-08-20 MED ORDER — LIDOCAINE HCL (PF) 1 % IJ SOLN
INTRAMUSCULAR | Status: AC
  Filled 2018-08-20: qty 30

## 2018-08-20 NOTE — Procedures (Signed)
Pre procedural Dx: ESRD Post procedural Dx: Same  Successful removal of tunneled HD catheter  EBL: None No immediate complications.  Please see imaging section of Epic for full dictation.  Joaquim Nam PA-C 08/20/2018 3:38 PM

## 2018-08-21 DIAGNOSIS — D631 Anemia in chronic kidney disease: Secondary | ICD-10-CM | POA: Diagnosis not present

## 2018-08-21 DIAGNOSIS — N2581 Secondary hyperparathyroidism of renal origin: Secondary | ICD-10-CM | POA: Diagnosis not present

## 2018-08-21 DIAGNOSIS — Z992 Dependence on renal dialysis: Secondary | ICD-10-CM | POA: Diagnosis not present

## 2018-08-21 DIAGNOSIS — D509 Iron deficiency anemia, unspecified: Secondary | ICD-10-CM | POA: Diagnosis not present

## 2018-08-21 DIAGNOSIS — D472 Monoclonal gammopathy: Secondary | ICD-10-CM | POA: Diagnosis not present

## 2018-08-21 DIAGNOSIS — N186 End stage renal disease: Secondary | ICD-10-CM | POA: Diagnosis not present

## 2018-08-24 DIAGNOSIS — D509 Iron deficiency anemia, unspecified: Secondary | ICD-10-CM | POA: Diagnosis not present

## 2018-08-24 DIAGNOSIS — N186 End stage renal disease: Secondary | ICD-10-CM | POA: Diagnosis not present

## 2018-08-24 DIAGNOSIS — Z992 Dependence on renal dialysis: Secondary | ICD-10-CM | POA: Diagnosis not present

## 2018-08-24 DIAGNOSIS — D631 Anemia in chronic kidney disease: Secondary | ICD-10-CM | POA: Diagnosis not present

## 2018-08-24 DIAGNOSIS — D472 Monoclonal gammopathy: Secondary | ICD-10-CM | POA: Diagnosis not present

## 2018-08-24 DIAGNOSIS — N2581 Secondary hyperparathyroidism of renal origin: Secondary | ICD-10-CM | POA: Diagnosis not present

## 2018-08-26 DIAGNOSIS — D472 Monoclonal gammopathy: Secondary | ICD-10-CM | POA: Diagnosis not present

## 2018-08-26 DIAGNOSIS — D509 Iron deficiency anemia, unspecified: Secondary | ICD-10-CM | POA: Diagnosis not present

## 2018-08-26 DIAGNOSIS — D631 Anemia in chronic kidney disease: Secondary | ICD-10-CM | POA: Diagnosis not present

## 2018-08-26 DIAGNOSIS — N2581 Secondary hyperparathyroidism of renal origin: Secondary | ICD-10-CM | POA: Diagnosis not present

## 2018-08-26 DIAGNOSIS — N186 End stage renal disease: Secondary | ICD-10-CM | POA: Diagnosis not present

## 2018-08-26 DIAGNOSIS — Z992 Dependence on renal dialysis: Secondary | ICD-10-CM | POA: Diagnosis not present

## 2018-08-28 DIAGNOSIS — N2581 Secondary hyperparathyroidism of renal origin: Secondary | ICD-10-CM | POA: Diagnosis not present

## 2018-08-28 DIAGNOSIS — D472 Monoclonal gammopathy: Secondary | ICD-10-CM | POA: Diagnosis not present

## 2018-08-28 DIAGNOSIS — Z992 Dependence on renal dialysis: Secondary | ICD-10-CM | POA: Diagnosis not present

## 2018-08-28 DIAGNOSIS — D509 Iron deficiency anemia, unspecified: Secondary | ICD-10-CM | POA: Diagnosis not present

## 2018-08-28 DIAGNOSIS — D631 Anemia in chronic kidney disease: Secondary | ICD-10-CM | POA: Diagnosis not present

## 2018-08-28 DIAGNOSIS — N186 End stage renal disease: Secondary | ICD-10-CM | POA: Diagnosis not present

## 2018-08-31 DIAGNOSIS — D631 Anemia in chronic kidney disease: Secondary | ICD-10-CM | POA: Diagnosis not present

## 2018-08-31 DIAGNOSIS — N186 End stage renal disease: Secondary | ICD-10-CM | POA: Diagnosis not present

## 2018-08-31 DIAGNOSIS — Z992 Dependence on renal dialysis: Secondary | ICD-10-CM | POA: Diagnosis not present

## 2018-08-31 DIAGNOSIS — D472 Monoclonal gammopathy: Secondary | ICD-10-CM | POA: Diagnosis not present

## 2018-08-31 DIAGNOSIS — D509 Iron deficiency anemia, unspecified: Secondary | ICD-10-CM | POA: Diagnosis not present

## 2018-08-31 DIAGNOSIS — N2581 Secondary hyperparathyroidism of renal origin: Secondary | ICD-10-CM | POA: Diagnosis not present

## 2018-09-02 DIAGNOSIS — D509 Iron deficiency anemia, unspecified: Secondary | ICD-10-CM | POA: Diagnosis not present

## 2018-09-02 DIAGNOSIS — N2581 Secondary hyperparathyroidism of renal origin: Secondary | ICD-10-CM | POA: Diagnosis not present

## 2018-09-02 DIAGNOSIS — N186 End stage renal disease: Secondary | ICD-10-CM | POA: Diagnosis not present

## 2018-09-02 DIAGNOSIS — D631 Anemia in chronic kidney disease: Secondary | ICD-10-CM | POA: Diagnosis not present

## 2018-09-02 DIAGNOSIS — D472 Monoclonal gammopathy: Secondary | ICD-10-CM | POA: Diagnosis not present

## 2018-09-02 DIAGNOSIS — Z992 Dependence on renal dialysis: Secondary | ICD-10-CM | POA: Diagnosis not present

## 2018-09-02 IMAGING — DX DG BONE SURVEY MET
10 series · 10 of 10 positions shown · non-contrast
Comparison: Chest x-ray 08/21/2015.  CT 10/01/2011.

CLINICAL DATA: Monoclonal gammopathy.

EXAM:
METASTATIC BONE SURVEY

[skull lat]
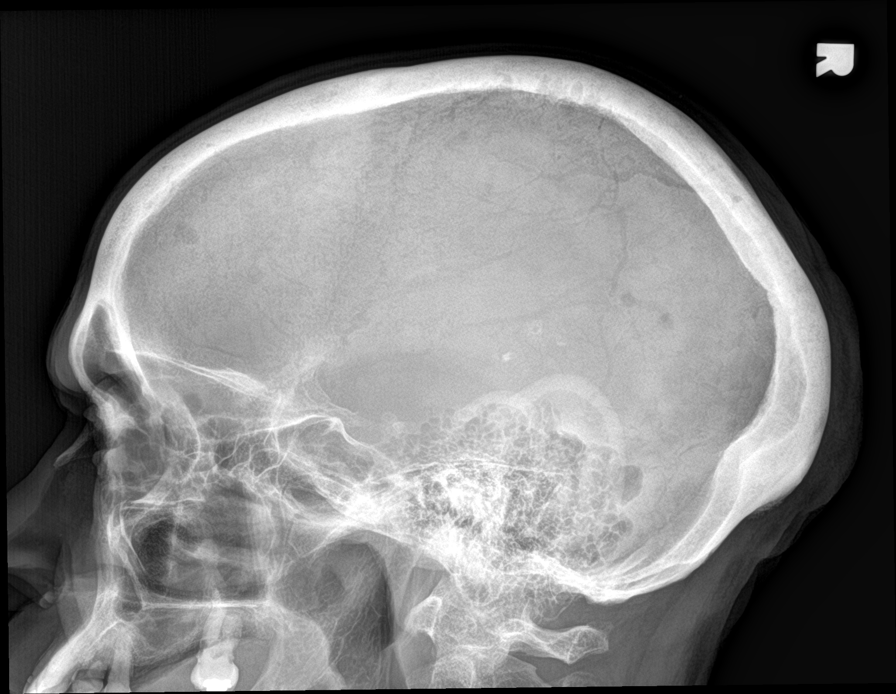

[shoulder ap (1 of 2)]
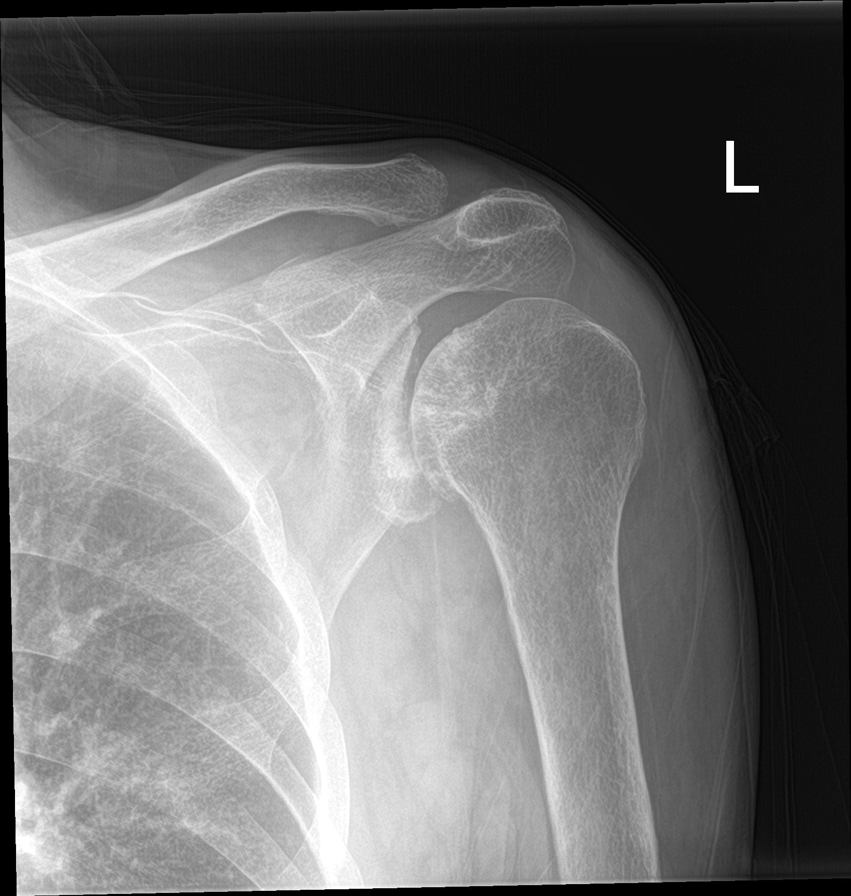

[shoulder ap (2 of 2)]
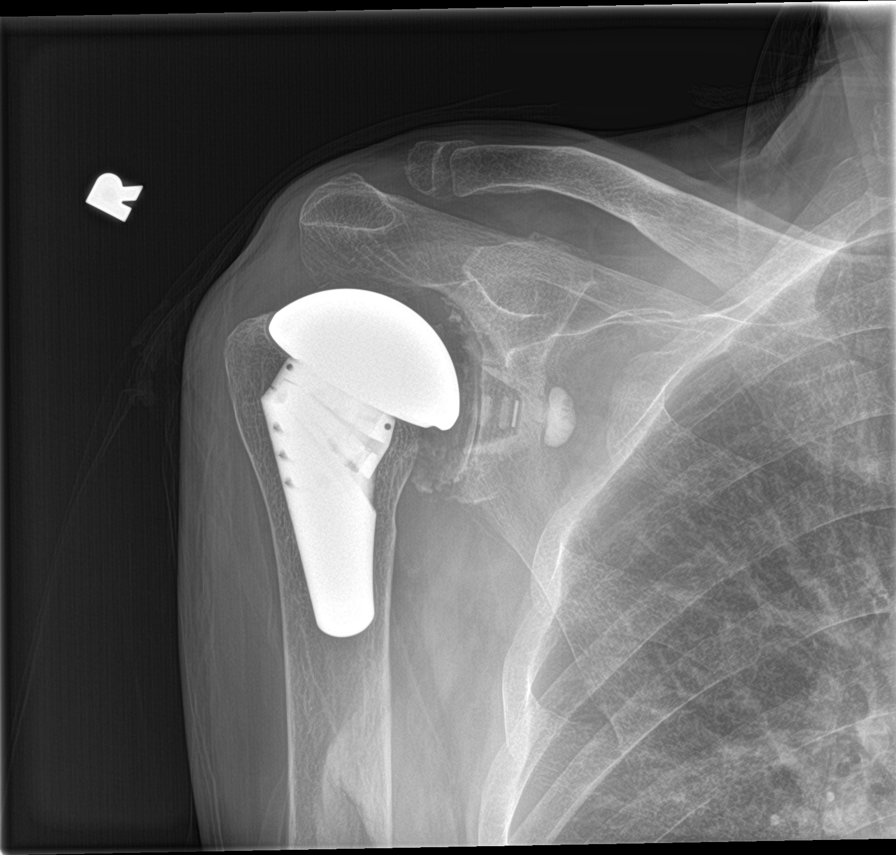

[humerus ap (1 of 2)]
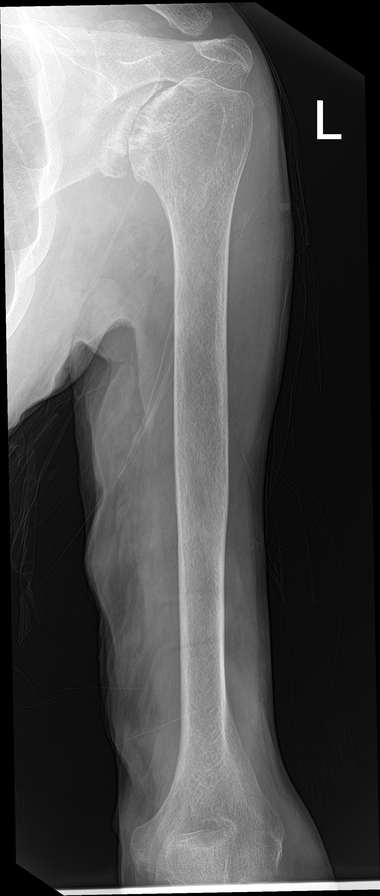

[humerus ap (2 of 2)]
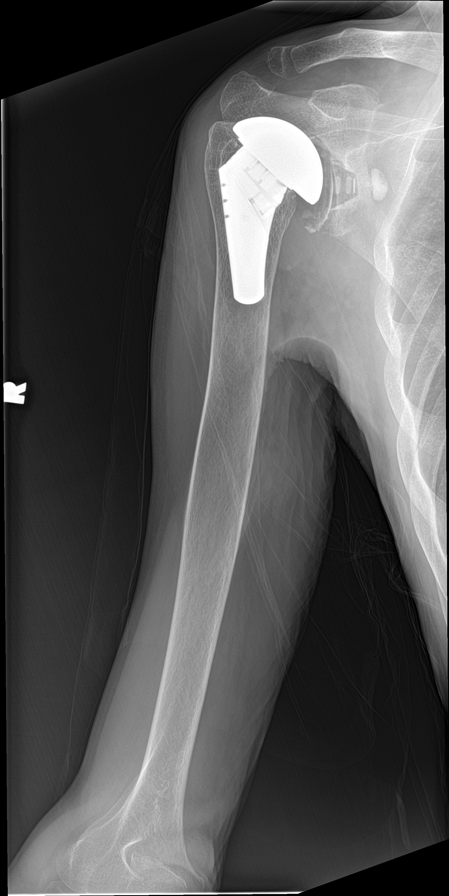

[forearm ap (1 of 2)]
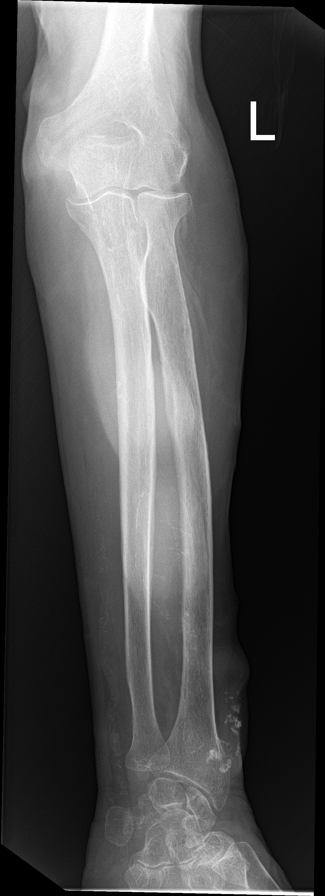

[forearm ap (2 of 2)]
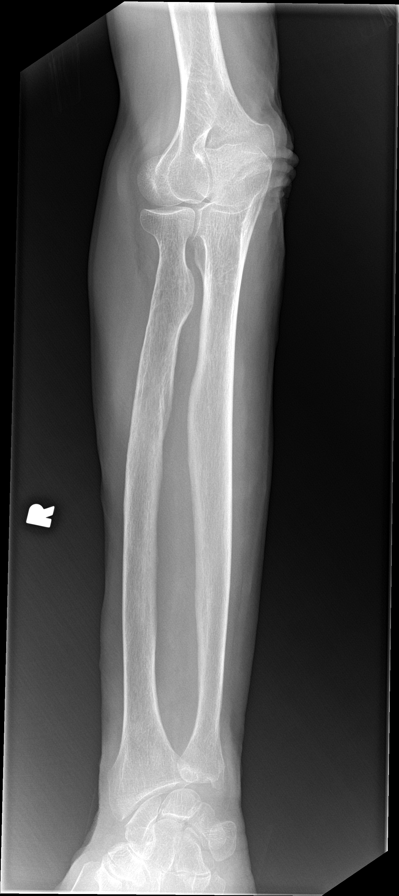

[c-spine ap]
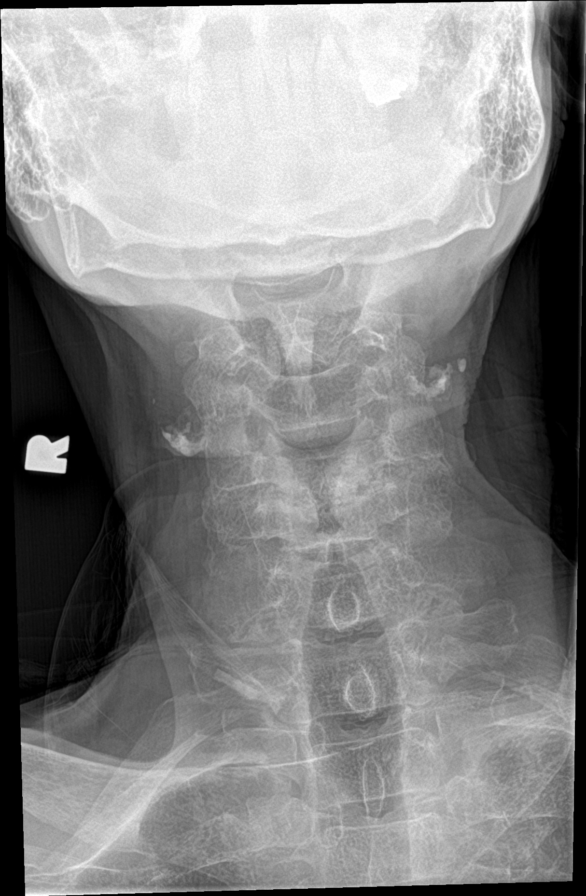

[c-spine lat]
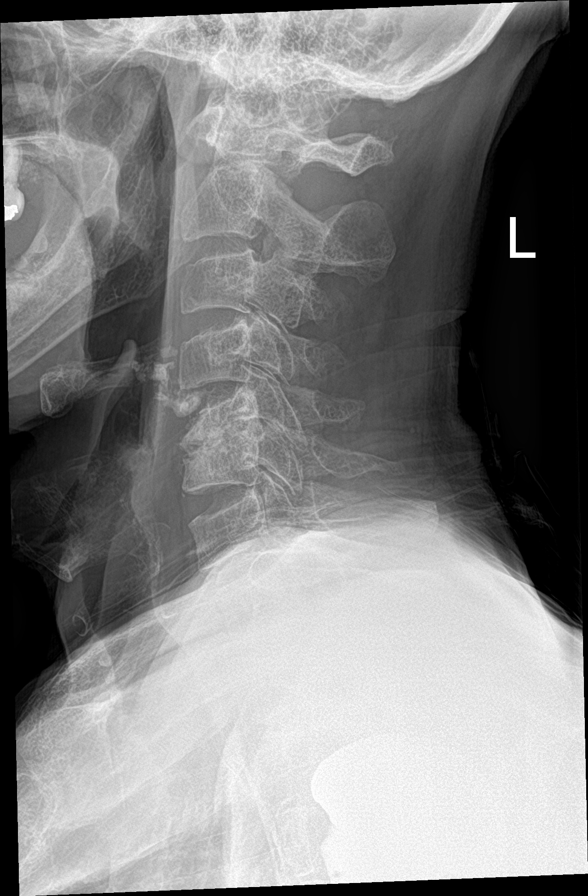

[t-spine lat]
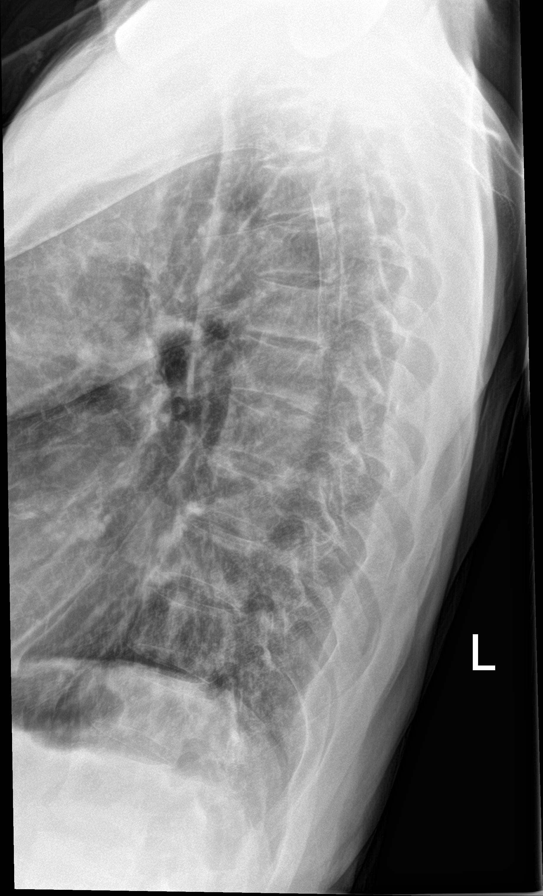

[10 of 10 positions shown; findings below may reference images not displayed]

FINDINGS: Multiple lytic lesions noted throughout the skull consistent with
multiple myeloma. Expansile lytic lesion in the left L4 pedicle.
This could represent a plasmocytoma. Other bony tumors including
Brown tumor and metastatic disease could present in this fashion.
Lytic lesion in the upper right femoral neck is noted. Plasmacytoma
could present in this fashion. Other bony tumors including Brown
tumor and metastatic disease could present in this fashion. Diffuse
severe osteopenia noted. Calcified rejected left renal transplant
noted. Carotid vascular calcification. Right shoulder replacement.
Left forearm dialysis graft with calcification noted. Severe
degenerative changes noted of the lower cervical spine. Diffuse
degenerative changes noted throughout the remainder of the spine.
IMPRESSION: 1. Multiple lytic lesions noted throughout the skull consistent with
multiple myeloma.

2. Expansile lytic lesion noted within the left L4 pedicle. Lytic
lesion noted in the right femoral neck. These could represent
plasmocytoma was. Other bony lesions including brown tumors and
metastatic disease cannot be excluded.

3. Calcified projected left renal transplant. Left forearm dialysis
graft with calcification noted.

4.  Carotid vascular disease.

## 2018-09-04 DIAGNOSIS — D509 Iron deficiency anemia, unspecified: Secondary | ICD-10-CM | POA: Diagnosis not present

## 2018-09-04 DIAGNOSIS — N2581 Secondary hyperparathyroidism of renal origin: Secondary | ICD-10-CM | POA: Diagnosis not present

## 2018-09-04 DIAGNOSIS — N186 End stage renal disease: Secondary | ICD-10-CM | POA: Diagnosis not present

## 2018-09-04 DIAGNOSIS — D631 Anemia in chronic kidney disease: Secondary | ICD-10-CM | POA: Diagnosis not present

## 2018-09-04 DIAGNOSIS — Z992 Dependence on renal dialysis: Secondary | ICD-10-CM | POA: Diagnosis not present

## 2018-09-04 DIAGNOSIS — D472 Monoclonal gammopathy: Secondary | ICD-10-CM | POA: Diagnosis not present

## 2018-09-07 DIAGNOSIS — Z992 Dependence on renal dialysis: Secondary | ICD-10-CM | POA: Diagnosis not present

## 2018-09-07 DIAGNOSIS — D509 Iron deficiency anemia, unspecified: Secondary | ICD-10-CM | POA: Diagnosis not present

## 2018-09-07 DIAGNOSIS — D472 Monoclonal gammopathy: Secondary | ICD-10-CM | POA: Diagnosis not present

## 2018-09-07 DIAGNOSIS — N2581 Secondary hyperparathyroidism of renal origin: Secondary | ICD-10-CM | POA: Diagnosis not present

## 2018-09-07 DIAGNOSIS — D631 Anemia in chronic kidney disease: Secondary | ICD-10-CM | POA: Diagnosis not present

## 2018-09-07 DIAGNOSIS — N186 End stage renal disease: Secondary | ICD-10-CM | POA: Diagnosis not present

## 2018-09-07 DIAGNOSIS — Z23 Encounter for immunization: Secondary | ICD-10-CM | POA: Diagnosis not present

## 2018-09-09 DIAGNOSIS — D509 Iron deficiency anemia, unspecified: Secondary | ICD-10-CM | POA: Diagnosis not present

## 2018-09-09 DIAGNOSIS — N2581 Secondary hyperparathyroidism of renal origin: Secondary | ICD-10-CM | POA: Diagnosis not present

## 2018-09-09 DIAGNOSIS — Z992 Dependence on renal dialysis: Secondary | ICD-10-CM | POA: Diagnosis not present

## 2018-09-09 DIAGNOSIS — Z23 Encounter for immunization: Secondary | ICD-10-CM | POA: Diagnosis not present

## 2018-09-09 DIAGNOSIS — D472 Monoclonal gammopathy: Secondary | ICD-10-CM | POA: Diagnosis not present

## 2018-09-09 DIAGNOSIS — N186 End stage renal disease: Secondary | ICD-10-CM | POA: Diagnosis not present

## 2018-09-11 DIAGNOSIS — Z23 Encounter for immunization: Secondary | ICD-10-CM | POA: Diagnosis not present

## 2018-09-11 DIAGNOSIS — N186 End stage renal disease: Secondary | ICD-10-CM | POA: Diagnosis not present

## 2018-09-11 DIAGNOSIS — D472 Monoclonal gammopathy: Secondary | ICD-10-CM | POA: Diagnosis not present

## 2018-09-11 DIAGNOSIS — N2581 Secondary hyperparathyroidism of renal origin: Secondary | ICD-10-CM | POA: Diagnosis not present

## 2018-09-11 DIAGNOSIS — D509 Iron deficiency anemia, unspecified: Secondary | ICD-10-CM | POA: Diagnosis not present

## 2018-09-11 DIAGNOSIS — Z992 Dependence on renal dialysis: Secondary | ICD-10-CM | POA: Diagnosis not present

## 2018-09-14 DIAGNOSIS — Z992 Dependence on renal dialysis: Secondary | ICD-10-CM | POA: Diagnosis not present

## 2018-09-14 DIAGNOSIS — N186 End stage renal disease: Secondary | ICD-10-CM | POA: Diagnosis not present

## 2018-09-14 DIAGNOSIS — D509 Iron deficiency anemia, unspecified: Secondary | ICD-10-CM | POA: Diagnosis not present

## 2018-09-14 DIAGNOSIS — Z23 Encounter for immunization: Secondary | ICD-10-CM | POA: Diagnosis not present

## 2018-09-14 DIAGNOSIS — N2581 Secondary hyperparathyroidism of renal origin: Secondary | ICD-10-CM | POA: Diagnosis not present

## 2018-09-14 DIAGNOSIS — D472 Monoclonal gammopathy: Secondary | ICD-10-CM | POA: Diagnosis not present

## 2018-09-16 DIAGNOSIS — Z992 Dependence on renal dialysis: Secondary | ICD-10-CM | POA: Diagnosis not present

## 2018-09-16 DIAGNOSIS — N2581 Secondary hyperparathyroidism of renal origin: Secondary | ICD-10-CM | POA: Diagnosis not present

## 2018-09-16 DIAGNOSIS — D472 Monoclonal gammopathy: Secondary | ICD-10-CM | POA: Diagnosis not present

## 2018-09-16 DIAGNOSIS — N186 End stage renal disease: Secondary | ICD-10-CM | POA: Diagnosis not present

## 2018-09-16 DIAGNOSIS — D509 Iron deficiency anemia, unspecified: Secondary | ICD-10-CM | POA: Diagnosis not present

## 2018-09-16 DIAGNOSIS — Z23 Encounter for immunization: Secondary | ICD-10-CM | POA: Diagnosis not present

## 2018-09-18 DIAGNOSIS — D509 Iron deficiency anemia, unspecified: Secondary | ICD-10-CM | POA: Diagnosis not present

## 2018-09-18 DIAGNOSIS — Z992 Dependence on renal dialysis: Secondary | ICD-10-CM | POA: Diagnosis not present

## 2018-09-18 DIAGNOSIS — N2581 Secondary hyperparathyroidism of renal origin: Secondary | ICD-10-CM | POA: Diagnosis not present

## 2018-09-18 DIAGNOSIS — N186 End stage renal disease: Secondary | ICD-10-CM | POA: Diagnosis not present

## 2018-09-18 DIAGNOSIS — D472 Monoclonal gammopathy: Secondary | ICD-10-CM | POA: Diagnosis not present

## 2018-09-18 DIAGNOSIS — Z23 Encounter for immunization: Secondary | ICD-10-CM | POA: Diagnosis not present

## 2018-09-21 DIAGNOSIS — N2581 Secondary hyperparathyroidism of renal origin: Secondary | ICD-10-CM | POA: Diagnosis not present

## 2018-09-21 DIAGNOSIS — D509 Iron deficiency anemia, unspecified: Secondary | ICD-10-CM | POA: Diagnosis not present

## 2018-09-21 DIAGNOSIS — Z992 Dependence on renal dialysis: Secondary | ICD-10-CM | POA: Diagnosis not present

## 2018-09-21 DIAGNOSIS — D472 Monoclonal gammopathy: Secondary | ICD-10-CM | POA: Diagnosis not present

## 2018-09-21 DIAGNOSIS — N186 End stage renal disease: Secondary | ICD-10-CM | POA: Diagnosis not present

## 2018-09-21 DIAGNOSIS — Z23 Encounter for immunization: Secondary | ICD-10-CM | POA: Diagnosis not present

## 2018-09-23 DIAGNOSIS — N2581 Secondary hyperparathyroidism of renal origin: Secondary | ICD-10-CM | POA: Diagnosis not present

## 2018-09-23 DIAGNOSIS — D472 Monoclonal gammopathy: Secondary | ICD-10-CM | POA: Diagnosis not present

## 2018-09-23 DIAGNOSIS — N186 End stage renal disease: Secondary | ICD-10-CM | POA: Diagnosis not present

## 2018-09-23 DIAGNOSIS — Z992 Dependence on renal dialysis: Secondary | ICD-10-CM | POA: Diagnosis not present

## 2018-09-23 DIAGNOSIS — D509 Iron deficiency anemia, unspecified: Secondary | ICD-10-CM | POA: Diagnosis not present

## 2018-09-23 DIAGNOSIS — Z23 Encounter for immunization: Secondary | ICD-10-CM | POA: Diagnosis not present

## 2018-09-25 DIAGNOSIS — N2581 Secondary hyperparathyroidism of renal origin: Secondary | ICD-10-CM | POA: Diagnosis not present

## 2018-09-25 DIAGNOSIS — Z992 Dependence on renal dialysis: Secondary | ICD-10-CM | POA: Diagnosis not present

## 2018-09-25 DIAGNOSIS — N186 End stage renal disease: Secondary | ICD-10-CM | POA: Diagnosis not present

## 2018-09-25 DIAGNOSIS — Z23 Encounter for immunization: Secondary | ICD-10-CM | POA: Diagnosis not present

## 2018-09-25 DIAGNOSIS — D472 Monoclonal gammopathy: Secondary | ICD-10-CM | POA: Diagnosis not present

## 2018-09-25 DIAGNOSIS — D509 Iron deficiency anemia, unspecified: Secondary | ICD-10-CM | POA: Diagnosis not present

## 2018-09-28 DIAGNOSIS — N2581 Secondary hyperparathyroidism of renal origin: Secondary | ICD-10-CM | POA: Diagnosis not present

## 2018-09-28 DIAGNOSIS — Z23 Encounter for immunization: Secondary | ICD-10-CM | POA: Diagnosis not present

## 2018-09-28 DIAGNOSIS — D509 Iron deficiency anemia, unspecified: Secondary | ICD-10-CM | POA: Diagnosis not present

## 2018-09-28 DIAGNOSIS — N186 End stage renal disease: Secondary | ICD-10-CM | POA: Diagnosis not present

## 2018-09-28 DIAGNOSIS — D472 Monoclonal gammopathy: Secondary | ICD-10-CM | POA: Diagnosis not present

## 2018-09-28 DIAGNOSIS — Z992 Dependence on renal dialysis: Secondary | ICD-10-CM | POA: Diagnosis not present

## 2018-09-30 DIAGNOSIS — D509 Iron deficiency anemia, unspecified: Secondary | ICD-10-CM | POA: Diagnosis not present

## 2018-09-30 DIAGNOSIS — D472 Monoclonal gammopathy: Secondary | ICD-10-CM | POA: Diagnosis not present

## 2018-09-30 DIAGNOSIS — Z992 Dependence on renal dialysis: Secondary | ICD-10-CM | POA: Diagnosis not present

## 2018-09-30 DIAGNOSIS — N2581 Secondary hyperparathyroidism of renal origin: Secondary | ICD-10-CM | POA: Diagnosis not present

## 2018-09-30 DIAGNOSIS — N186 End stage renal disease: Secondary | ICD-10-CM | POA: Diagnosis not present

## 2018-09-30 DIAGNOSIS — Z23 Encounter for immunization: Secondary | ICD-10-CM | POA: Diagnosis not present

## 2018-10-02 DIAGNOSIS — N2581 Secondary hyperparathyroidism of renal origin: Secondary | ICD-10-CM | POA: Diagnosis not present

## 2018-10-02 DIAGNOSIS — N186 End stage renal disease: Secondary | ICD-10-CM | POA: Diagnosis not present

## 2018-10-02 DIAGNOSIS — D509 Iron deficiency anemia, unspecified: Secondary | ICD-10-CM | POA: Diagnosis not present

## 2018-10-02 DIAGNOSIS — D472 Monoclonal gammopathy: Secondary | ICD-10-CM | POA: Diagnosis not present

## 2018-10-02 DIAGNOSIS — Z23 Encounter for immunization: Secondary | ICD-10-CM | POA: Diagnosis not present

## 2018-10-02 DIAGNOSIS — Z992 Dependence on renal dialysis: Secondary | ICD-10-CM | POA: Diagnosis not present

## 2018-10-03 DIAGNOSIS — Z992 Dependence on renal dialysis: Secondary | ICD-10-CM | POA: Diagnosis not present

## 2018-10-03 DIAGNOSIS — N186 End stage renal disease: Secondary | ICD-10-CM | POA: Diagnosis not present

## 2018-10-06 DIAGNOSIS — Z7189 Other specified counseling: Secondary | ICD-10-CM | POA: Diagnosis not present

## 2018-10-06 DIAGNOSIS — C9000 Multiple myeloma not having achieved remission: Secondary | ICD-10-CM | POA: Diagnosis not present

## 2018-10-06 DIAGNOSIS — Z1211 Encounter for screening for malignant neoplasm of colon: Secondary | ICD-10-CM | POA: Diagnosis not present

## 2018-10-06 DIAGNOSIS — I10 Essential (primary) hypertension: Secondary | ICD-10-CM | POA: Diagnosis not present

## 2018-10-06 DIAGNOSIS — Z0000 Encounter for general adult medical examination without abnormal findings: Secondary | ICD-10-CM | POA: Diagnosis not present

## 2018-10-06 DIAGNOSIS — I4891 Unspecified atrial fibrillation: Secondary | ICD-10-CM | POA: Diagnosis not present

## 2018-10-06 DIAGNOSIS — Z1331 Encounter for screening for depression: Secondary | ICD-10-CM | POA: Diagnosis not present

## 2018-10-06 DIAGNOSIS — Z1339 Encounter for screening examination for other mental health and behavioral disorders: Secondary | ICD-10-CM | POA: Diagnosis not present

## 2018-10-06 DIAGNOSIS — E7800 Pure hypercholesterolemia, unspecified: Secondary | ICD-10-CM | POA: Diagnosis not present

## 2018-10-06 DIAGNOSIS — Z6821 Body mass index (BMI) 21.0-21.9, adult: Secondary | ICD-10-CM | POA: Diagnosis not present

## 2018-10-06 DIAGNOSIS — Z299 Encounter for prophylactic measures, unspecified: Secondary | ICD-10-CM | POA: Diagnosis not present

## 2018-10-07 DIAGNOSIS — D472 Monoclonal gammopathy: Secondary | ICD-10-CM | POA: Diagnosis not present

## 2018-10-07 DIAGNOSIS — N2581 Secondary hyperparathyroidism of renal origin: Secondary | ICD-10-CM | POA: Diagnosis not present

## 2018-10-07 DIAGNOSIS — D631 Anemia in chronic kidney disease: Secondary | ICD-10-CM | POA: Diagnosis not present

## 2018-10-07 DIAGNOSIS — Z992 Dependence on renal dialysis: Secondary | ICD-10-CM | POA: Diagnosis not present

## 2018-10-07 DIAGNOSIS — N186 End stage renal disease: Secondary | ICD-10-CM | POA: Diagnosis not present

## 2018-10-07 DIAGNOSIS — D509 Iron deficiency anemia, unspecified: Secondary | ICD-10-CM | POA: Diagnosis not present

## 2018-10-09 ENCOUNTER — Ambulatory Visit: Payer: Medicare Other | Admitting: Cardiovascular Disease

## 2018-10-09 DIAGNOSIS — D509 Iron deficiency anemia, unspecified: Secondary | ICD-10-CM | POA: Diagnosis not present

## 2018-10-09 DIAGNOSIS — N2581 Secondary hyperparathyroidism of renal origin: Secondary | ICD-10-CM | POA: Diagnosis not present

## 2018-10-09 DIAGNOSIS — N186 End stage renal disease: Secondary | ICD-10-CM | POA: Diagnosis not present

## 2018-10-09 DIAGNOSIS — Z992 Dependence on renal dialysis: Secondary | ICD-10-CM | POA: Diagnosis not present

## 2018-10-09 DIAGNOSIS — D472 Monoclonal gammopathy: Secondary | ICD-10-CM | POA: Diagnosis not present

## 2018-10-09 DIAGNOSIS — D631 Anemia in chronic kidney disease: Secondary | ICD-10-CM | POA: Diagnosis not present

## 2018-10-12 DIAGNOSIS — Z992 Dependence on renal dialysis: Secondary | ICD-10-CM | POA: Diagnosis not present

## 2018-10-12 DIAGNOSIS — D509 Iron deficiency anemia, unspecified: Secondary | ICD-10-CM | POA: Diagnosis not present

## 2018-10-12 DIAGNOSIS — N186 End stage renal disease: Secondary | ICD-10-CM | POA: Diagnosis not present

## 2018-10-12 DIAGNOSIS — D631 Anemia in chronic kidney disease: Secondary | ICD-10-CM | POA: Diagnosis not present

## 2018-10-12 DIAGNOSIS — N2581 Secondary hyperparathyroidism of renal origin: Secondary | ICD-10-CM | POA: Diagnosis not present

## 2018-10-12 DIAGNOSIS — D472 Monoclonal gammopathy: Secondary | ICD-10-CM | POA: Diagnosis not present

## 2018-10-13 DIAGNOSIS — C9000 Multiple myeloma not having achieved remission: Secondary | ICD-10-CM | POA: Diagnosis not present

## 2018-10-13 DIAGNOSIS — R0602 Shortness of breath: Secondary | ICD-10-CM | POA: Diagnosis not present

## 2018-10-13 DIAGNOSIS — R4182 Altered mental status, unspecified: Secondary | ICD-10-CM | POA: Diagnosis not present

## 2018-10-13 DIAGNOSIS — I120 Hypertensive chronic kidney disease with stage 5 chronic kidney disease or end stage renal disease: Secondary | ICD-10-CM | POA: Diagnosis not present

## 2018-10-13 DIAGNOSIS — R410 Disorientation, unspecified: Secondary | ICD-10-CM | POA: Diagnosis not present

## 2018-10-13 DIAGNOSIS — R404 Transient alteration of awareness: Secondary | ICD-10-CM | POA: Diagnosis not present

## 2018-10-13 DIAGNOSIS — N186 End stage renal disease: Secondary | ICD-10-CM | POA: Diagnosis not present

## 2018-10-13 DIAGNOSIS — Z992 Dependence on renal dialysis: Secondary | ICD-10-CM | POA: Diagnosis not present

## 2018-10-13 DIAGNOSIS — Z8619 Personal history of other infectious and parasitic diseases: Secondary | ICD-10-CM | POA: Diagnosis not present

## 2018-10-13 DIAGNOSIS — Z79899 Other long term (current) drug therapy: Secondary | ICD-10-CM | POA: Diagnosis not present

## 2018-10-13 DIAGNOSIS — R0989 Other specified symptoms and signs involving the circulatory and respiratory systems: Secondary | ICD-10-CM | POA: Diagnosis not present

## 2018-10-13 DIAGNOSIS — R638 Other symptoms and signs concerning food and fluid intake: Secondary | ICD-10-CM | POA: Diagnosis not present

## 2018-10-13 DIAGNOSIS — J209 Acute bronchitis, unspecified: Secondary | ICD-10-CM | POA: Diagnosis not present

## 2018-10-13 DIAGNOSIS — I272 Pulmonary hypertension, unspecified: Secondary | ICD-10-CM | POA: Diagnosis not present

## 2018-10-14 DIAGNOSIS — I509 Heart failure, unspecified: Secondary | ICD-10-CM | POA: Diagnosis not present

## 2018-10-14 DIAGNOSIS — J209 Acute bronchitis, unspecified: Secondary | ICD-10-CM | POA: Diagnosis not present

## 2018-10-14 DIAGNOSIS — I10 Essential (primary) hypertension: Secondary | ICD-10-CM | POA: Diagnosis not present

## 2018-10-14 DIAGNOSIS — Z992 Dependence on renal dialysis: Secondary | ICD-10-CM | POA: Diagnosis not present

## 2018-10-14 DIAGNOSIS — R410 Disorientation, unspecified: Secondary | ICD-10-CM | POA: Diagnosis not present

## 2018-10-14 DIAGNOSIS — C9000 Multiple myeloma not having achieved remission: Secondary | ICD-10-CM | POA: Diagnosis not present

## 2018-10-14 DIAGNOSIS — I120 Hypertensive chronic kidney disease with stage 5 chronic kidney disease or end stage renal disease: Secondary | ICD-10-CM | POA: Diagnosis not present

## 2018-10-14 DIAGNOSIS — N186 End stage renal disease: Secondary | ICD-10-CM | POA: Diagnosis not present

## 2018-10-16 DIAGNOSIS — Z992 Dependence on renal dialysis: Secondary | ICD-10-CM | POA: Diagnosis not present

## 2018-10-16 DIAGNOSIS — D631 Anemia in chronic kidney disease: Secondary | ICD-10-CM | POA: Diagnosis not present

## 2018-10-16 DIAGNOSIS — D472 Monoclonal gammopathy: Secondary | ICD-10-CM | POA: Diagnosis not present

## 2018-10-16 DIAGNOSIS — N2581 Secondary hyperparathyroidism of renal origin: Secondary | ICD-10-CM | POA: Diagnosis not present

## 2018-10-16 DIAGNOSIS — D509 Iron deficiency anemia, unspecified: Secondary | ICD-10-CM | POA: Diagnosis not present

## 2018-10-16 DIAGNOSIS — N186 End stage renal disease: Secondary | ICD-10-CM | POA: Diagnosis not present

## 2018-10-19 DIAGNOSIS — D631 Anemia in chronic kidney disease: Secondary | ICD-10-CM | POA: Diagnosis not present

## 2018-10-19 DIAGNOSIS — D509 Iron deficiency anemia, unspecified: Secondary | ICD-10-CM | POA: Diagnosis not present

## 2018-10-19 DIAGNOSIS — D472 Monoclonal gammopathy: Secondary | ICD-10-CM | POA: Diagnosis not present

## 2018-10-19 DIAGNOSIS — Z992 Dependence on renal dialysis: Secondary | ICD-10-CM | POA: Diagnosis not present

## 2018-10-19 DIAGNOSIS — N2581 Secondary hyperparathyroidism of renal origin: Secondary | ICD-10-CM | POA: Diagnosis not present

## 2018-10-19 DIAGNOSIS — N186 End stage renal disease: Secondary | ICD-10-CM | POA: Diagnosis not present

## 2018-10-21 DIAGNOSIS — D631 Anemia in chronic kidney disease: Secondary | ICD-10-CM | POA: Diagnosis not present

## 2018-10-21 DIAGNOSIS — D509 Iron deficiency anemia, unspecified: Secondary | ICD-10-CM | POA: Diagnosis not present

## 2018-10-21 DIAGNOSIS — N2581 Secondary hyperparathyroidism of renal origin: Secondary | ICD-10-CM | POA: Diagnosis not present

## 2018-10-21 DIAGNOSIS — D472 Monoclonal gammopathy: Secondary | ICD-10-CM | POA: Diagnosis not present

## 2018-10-21 DIAGNOSIS — Z992 Dependence on renal dialysis: Secondary | ICD-10-CM | POA: Diagnosis not present

## 2018-10-21 DIAGNOSIS — N186 End stage renal disease: Secondary | ICD-10-CM | POA: Diagnosis not present

## 2018-10-23 DIAGNOSIS — Z992 Dependence on renal dialysis: Secondary | ICD-10-CM | POA: Diagnosis not present

## 2018-10-23 DIAGNOSIS — D472 Monoclonal gammopathy: Secondary | ICD-10-CM | POA: Diagnosis not present

## 2018-10-23 DIAGNOSIS — D631 Anemia in chronic kidney disease: Secondary | ICD-10-CM | POA: Diagnosis not present

## 2018-10-23 DIAGNOSIS — N186 End stage renal disease: Secondary | ICD-10-CM | POA: Diagnosis not present

## 2018-10-23 DIAGNOSIS — D509 Iron deficiency anemia, unspecified: Secondary | ICD-10-CM | POA: Diagnosis not present

## 2018-10-23 DIAGNOSIS — N2581 Secondary hyperparathyroidism of renal origin: Secondary | ICD-10-CM | POA: Diagnosis not present

## 2018-10-26 DIAGNOSIS — D631 Anemia in chronic kidney disease: Secondary | ICD-10-CM | POA: Diagnosis not present

## 2018-10-26 DIAGNOSIS — N2581 Secondary hyperparathyroidism of renal origin: Secondary | ICD-10-CM | POA: Diagnosis not present

## 2018-10-26 DIAGNOSIS — D509 Iron deficiency anemia, unspecified: Secondary | ICD-10-CM | POA: Diagnosis not present

## 2018-10-26 DIAGNOSIS — Z992 Dependence on renal dialysis: Secondary | ICD-10-CM | POA: Diagnosis not present

## 2018-10-26 DIAGNOSIS — D472 Monoclonal gammopathy: Secondary | ICD-10-CM | POA: Diagnosis not present

## 2018-10-26 DIAGNOSIS — E785 Hyperlipidemia, unspecified: Secondary | ICD-10-CM | POA: Diagnosis not present

## 2018-10-26 DIAGNOSIS — N186 End stage renal disease: Secondary | ICD-10-CM | POA: Diagnosis not present

## 2018-10-26 DIAGNOSIS — Z79899 Other long term (current) drug therapy: Secondary | ICD-10-CM | POA: Diagnosis not present

## 2018-10-28 DIAGNOSIS — D472 Monoclonal gammopathy: Secondary | ICD-10-CM | POA: Diagnosis not present

## 2018-10-28 DIAGNOSIS — N2581 Secondary hyperparathyroidism of renal origin: Secondary | ICD-10-CM | POA: Diagnosis not present

## 2018-10-28 DIAGNOSIS — D631 Anemia in chronic kidney disease: Secondary | ICD-10-CM | POA: Diagnosis not present

## 2018-10-28 DIAGNOSIS — D509 Iron deficiency anemia, unspecified: Secondary | ICD-10-CM | POA: Diagnosis not present

## 2018-10-28 DIAGNOSIS — Z992 Dependence on renal dialysis: Secondary | ICD-10-CM | POA: Diagnosis not present

## 2018-10-28 DIAGNOSIS — N186 End stage renal disease: Secondary | ICD-10-CM | POA: Diagnosis not present

## 2018-10-30 DIAGNOSIS — N2581 Secondary hyperparathyroidism of renal origin: Secondary | ICD-10-CM | POA: Diagnosis not present

## 2018-10-30 DIAGNOSIS — D509 Iron deficiency anemia, unspecified: Secondary | ICD-10-CM | POA: Diagnosis not present

## 2018-10-30 DIAGNOSIS — D472 Monoclonal gammopathy: Secondary | ICD-10-CM | POA: Diagnosis not present

## 2018-10-30 DIAGNOSIS — N186 End stage renal disease: Secondary | ICD-10-CM | POA: Diagnosis not present

## 2018-10-30 DIAGNOSIS — D631 Anemia in chronic kidney disease: Secondary | ICD-10-CM | POA: Diagnosis not present

## 2018-10-30 DIAGNOSIS — Z992 Dependence on renal dialysis: Secondary | ICD-10-CM | POA: Diagnosis not present

## 2018-11-02 DIAGNOSIS — N2581 Secondary hyperparathyroidism of renal origin: Secondary | ICD-10-CM | POA: Diagnosis not present

## 2018-11-02 DIAGNOSIS — D472 Monoclonal gammopathy: Secondary | ICD-10-CM | POA: Diagnosis not present

## 2018-11-02 DIAGNOSIS — D509 Iron deficiency anemia, unspecified: Secondary | ICD-10-CM | POA: Diagnosis not present

## 2018-11-02 DIAGNOSIS — D631 Anemia in chronic kidney disease: Secondary | ICD-10-CM | POA: Diagnosis not present

## 2018-11-02 DIAGNOSIS — Z992 Dependence on renal dialysis: Secondary | ICD-10-CM | POA: Diagnosis not present

## 2018-11-02 DIAGNOSIS — N186 End stage renal disease: Secondary | ICD-10-CM | POA: Diagnosis not present

## 2018-11-03 DIAGNOSIS — Z992 Dependence on renal dialysis: Secondary | ICD-10-CM | POA: Diagnosis not present

## 2018-11-03 DIAGNOSIS — N186 End stage renal disease: Secondary | ICD-10-CM | POA: Diagnosis not present

## 2018-11-04 DIAGNOSIS — N186 End stage renal disease: Secondary | ICD-10-CM | POA: Diagnosis not present

## 2018-11-04 DIAGNOSIS — D472 Monoclonal gammopathy: Secondary | ICD-10-CM | POA: Diagnosis not present

## 2018-11-04 DIAGNOSIS — D509 Iron deficiency anemia, unspecified: Secondary | ICD-10-CM | POA: Diagnosis not present

## 2018-11-04 DIAGNOSIS — D631 Anemia in chronic kidney disease: Secondary | ICD-10-CM | POA: Diagnosis not present

## 2018-11-04 DIAGNOSIS — N2581 Secondary hyperparathyroidism of renal origin: Secondary | ICD-10-CM | POA: Diagnosis not present

## 2018-11-04 DIAGNOSIS — Z992 Dependence on renal dialysis: Secondary | ICD-10-CM | POA: Diagnosis not present

## 2018-11-06 DIAGNOSIS — N2581 Secondary hyperparathyroidism of renal origin: Secondary | ICD-10-CM | POA: Diagnosis not present

## 2018-11-06 DIAGNOSIS — N186 End stage renal disease: Secondary | ICD-10-CM | POA: Diagnosis not present

## 2018-11-06 DIAGNOSIS — D509 Iron deficiency anemia, unspecified: Secondary | ICD-10-CM | POA: Diagnosis not present

## 2018-11-06 DIAGNOSIS — Z992 Dependence on renal dialysis: Secondary | ICD-10-CM | POA: Diagnosis not present

## 2018-11-06 DIAGNOSIS — D472 Monoclonal gammopathy: Secondary | ICD-10-CM | POA: Diagnosis not present

## 2018-11-06 DIAGNOSIS — D631 Anemia in chronic kidney disease: Secondary | ICD-10-CM | POA: Diagnosis not present

## 2018-11-09 DIAGNOSIS — D631 Anemia in chronic kidney disease: Secondary | ICD-10-CM | POA: Diagnosis not present

## 2018-11-09 DIAGNOSIS — D472 Monoclonal gammopathy: Secondary | ICD-10-CM | POA: Diagnosis not present

## 2018-11-09 DIAGNOSIS — N186 End stage renal disease: Secondary | ICD-10-CM | POA: Diagnosis not present

## 2018-11-09 DIAGNOSIS — Z992 Dependence on renal dialysis: Secondary | ICD-10-CM | POA: Diagnosis not present

## 2018-11-09 DIAGNOSIS — D509 Iron deficiency anemia, unspecified: Secondary | ICD-10-CM | POA: Diagnosis not present

## 2018-11-09 DIAGNOSIS — N2581 Secondary hyperparathyroidism of renal origin: Secondary | ICD-10-CM | POA: Diagnosis not present

## 2018-11-11 ENCOUNTER — Telehealth: Payer: Self-pay | Admitting: *Deleted

## 2018-11-11 DIAGNOSIS — D509 Iron deficiency anemia, unspecified: Secondary | ICD-10-CM | POA: Diagnosis not present

## 2018-11-11 DIAGNOSIS — Z992 Dependence on renal dialysis: Secondary | ICD-10-CM | POA: Diagnosis not present

## 2018-11-11 DIAGNOSIS — D631 Anemia in chronic kidney disease: Secondary | ICD-10-CM | POA: Diagnosis not present

## 2018-11-11 DIAGNOSIS — N2581 Secondary hyperparathyroidism of renal origin: Secondary | ICD-10-CM | POA: Diagnosis not present

## 2018-11-11 DIAGNOSIS — D472 Monoclonal gammopathy: Secondary | ICD-10-CM | POA: Diagnosis not present

## 2018-11-11 DIAGNOSIS — N186 End stage renal disease: Secondary | ICD-10-CM | POA: Diagnosis not present

## 2018-11-11 NOTE — Telephone Encounter (Signed)
Multiple attempts made to contact patient regarding appointment scheduled for 11/12/2018.    Mobile - mailbox full Home - lmtcb

## 2018-11-12 ENCOUNTER — Other Ambulatory Visit: Payer: Self-pay

## 2018-11-12 ENCOUNTER — Ambulatory Visit: Payer: Medicare Other | Admitting: Cardiovascular Disease

## 2018-11-12 NOTE — Telephone Encounter (Signed)
2 mth recall placed for pt

## 2018-11-12 NOTE — Telephone Encounter (Signed)
No return call to present date.  Will put in recall system.

## 2018-11-13 DIAGNOSIS — D631 Anemia in chronic kidney disease: Secondary | ICD-10-CM | POA: Diagnosis not present

## 2018-11-13 DIAGNOSIS — D472 Monoclonal gammopathy: Secondary | ICD-10-CM | POA: Diagnosis not present

## 2018-11-13 DIAGNOSIS — N186 End stage renal disease: Secondary | ICD-10-CM | POA: Diagnosis not present

## 2018-11-13 DIAGNOSIS — D509 Iron deficiency anemia, unspecified: Secondary | ICD-10-CM | POA: Diagnosis not present

## 2018-11-13 DIAGNOSIS — N2581 Secondary hyperparathyroidism of renal origin: Secondary | ICD-10-CM | POA: Diagnosis not present

## 2018-11-13 DIAGNOSIS — Z992 Dependence on renal dialysis: Secondary | ICD-10-CM | POA: Diagnosis not present

## 2018-11-13 NOTE — Telephone Encounter (Signed)
Noted. thx 

## 2018-11-16 DIAGNOSIS — D631 Anemia in chronic kidney disease: Secondary | ICD-10-CM | POA: Diagnosis not present

## 2018-11-16 DIAGNOSIS — N2581 Secondary hyperparathyroidism of renal origin: Secondary | ICD-10-CM | POA: Diagnosis not present

## 2018-11-16 DIAGNOSIS — N186 End stage renal disease: Secondary | ICD-10-CM | POA: Diagnosis not present

## 2018-11-16 DIAGNOSIS — Z992 Dependence on renal dialysis: Secondary | ICD-10-CM | POA: Diagnosis not present

## 2018-11-16 DIAGNOSIS — D472 Monoclonal gammopathy: Secondary | ICD-10-CM | POA: Diagnosis not present

## 2018-11-16 DIAGNOSIS — D509 Iron deficiency anemia, unspecified: Secondary | ICD-10-CM | POA: Diagnosis not present

## 2018-11-18 DIAGNOSIS — N186 End stage renal disease: Secondary | ICD-10-CM | POA: Diagnosis not present

## 2018-11-18 DIAGNOSIS — D472 Monoclonal gammopathy: Secondary | ICD-10-CM | POA: Diagnosis not present

## 2018-11-18 DIAGNOSIS — N2581 Secondary hyperparathyroidism of renal origin: Secondary | ICD-10-CM | POA: Diagnosis not present

## 2018-11-18 DIAGNOSIS — D631 Anemia in chronic kidney disease: Secondary | ICD-10-CM | POA: Diagnosis not present

## 2018-11-18 DIAGNOSIS — D509 Iron deficiency anemia, unspecified: Secondary | ICD-10-CM | POA: Diagnosis not present

## 2018-11-18 DIAGNOSIS — Z992 Dependence on renal dialysis: Secondary | ICD-10-CM | POA: Diagnosis not present

## 2018-11-20 DIAGNOSIS — N2581 Secondary hyperparathyroidism of renal origin: Secondary | ICD-10-CM | POA: Diagnosis not present

## 2018-11-20 DIAGNOSIS — Z992 Dependence on renal dialysis: Secondary | ICD-10-CM | POA: Diagnosis not present

## 2018-11-20 DIAGNOSIS — D631 Anemia in chronic kidney disease: Secondary | ICD-10-CM | POA: Diagnosis not present

## 2018-11-20 DIAGNOSIS — N186 End stage renal disease: Secondary | ICD-10-CM | POA: Diagnosis not present

## 2018-11-20 DIAGNOSIS — D509 Iron deficiency anemia, unspecified: Secondary | ICD-10-CM | POA: Diagnosis not present

## 2018-11-20 DIAGNOSIS — D472 Monoclonal gammopathy: Secondary | ICD-10-CM | POA: Diagnosis not present

## 2018-11-23 DIAGNOSIS — D631 Anemia in chronic kidney disease: Secondary | ICD-10-CM | POA: Diagnosis not present

## 2018-11-23 DIAGNOSIS — Z992 Dependence on renal dialysis: Secondary | ICD-10-CM | POA: Diagnosis not present

## 2018-11-23 DIAGNOSIS — N186 End stage renal disease: Secondary | ICD-10-CM | POA: Diagnosis not present

## 2018-11-23 DIAGNOSIS — D472 Monoclonal gammopathy: Secondary | ICD-10-CM | POA: Diagnosis not present

## 2018-11-23 DIAGNOSIS — N2581 Secondary hyperparathyroidism of renal origin: Secondary | ICD-10-CM | POA: Diagnosis not present

## 2018-11-23 DIAGNOSIS — D509 Iron deficiency anemia, unspecified: Secondary | ICD-10-CM | POA: Diagnosis not present

## 2018-11-25 DIAGNOSIS — D509 Iron deficiency anemia, unspecified: Secondary | ICD-10-CM | POA: Diagnosis not present

## 2018-11-25 DIAGNOSIS — N186 End stage renal disease: Secondary | ICD-10-CM | POA: Diagnosis not present

## 2018-11-25 DIAGNOSIS — N2581 Secondary hyperparathyroidism of renal origin: Secondary | ICD-10-CM | POA: Diagnosis not present

## 2018-11-25 DIAGNOSIS — D472 Monoclonal gammopathy: Secondary | ICD-10-CM | POA: Diagnosis not present

## 2018-11-25 DIAGNOSIS — Z992 Dependence on renal dialysis: Secondary | ICD-10-CM | POA: Diagnosis not present

## 2018-11-25 DIAGNOSIS — D631 Anemia in chronic kidney disease: Secondary | ICD-10-CM | POA: Diagnosis not present

## 2018-11-27 DIAGNOSIS — N2581 Secondary hyperparathyroidism of renal origin: Secondary | ICD-10-CM | POA: Diagnosis not present

## 2018-11-27 DIAGNOSIS — D631 Anemia in chronic kidney disease: Secondary | ICD-10-CM | POA: Diagnosis not present

## 2018-11-27 DIAGNOSIS — Z992 Dependence on renal dialysis: Secondary | ICD-10-CM | POA: Diagnosis not present

## 2018-11-27 DIAGNOSIS — D472 Monoclonal gammopathy: Secondary | ICD-10-CM | POA: Diagnosis not present

## 2018-11-27 DIAGNOSIS — N186 End stage renal disease: Secondary | ICD-10-CM | POA: Diagnosis not present

## 2018-11-27 DIAGNOSIS — D509 Iron deficiency anemia, unspecified: Secondary | ICD-10-CM | POA: Diagnosis not present

## 2018-11-30 DIAGNOSIS — Z992 Dependence on renal dialysis: Secondary | ICD-10-CM | POA: Diagnosis not present

## 2018-11-30 DIAGNOSIS — D472 Monoclonal gammopathy: Secondary | ICD-10-CM | POA: Diagnosis not present

## 2018-11-30 DIAGNOSIS — N186 End stage renal disease: Secondary | ICD-10-CM | POA: Diagnosis not present

## 2018-11-30 DIAGNOSIS — D631 Anemia in chronic kidney disease: Secondary | ICD-10-CM | POA: Diagnosis not present

## 2018-11-30 DIAGNOSIS — N2581 Secondary hyperparathyroidism of renal origin: Secondary | ICD-10-CM | POA: Diagnosis not present

## 2018-11-30 DIAGNOSIS — D509 Iron deficiency anemia, unspecified: Secondary | ICD-10-CM | POA: Diagnosis not present

## 2018-12-02 DIAGNOSIS — N2581 Secondary hyperparathyroidism of renal origin: Secondary | ICD-10-CM | POA: Diagnosis not present

## 2018-12-02 DIAGNOSIS — Z992 Dependence on renal dialysis: Secondary | ICD-10-CM | POA: Diagnosis not present

## 2018-12-02 DIAGNOSIS — D631 Anemia in chronic kidney disease: Secondary | ICD-10-CM | POA: Diagnosis not present

## 2018-12-02 DIAGNOSIS — D509 Iron deficiency anemia, unspecified: Secondary | ICD-10-CM | POA: Diagnosis not present

## 2018-12-02 DIAGNOSIS — D472 Monoclonal gammopathy: Secondary | ICD-10-CM | POA: Diagnosis not present

## 2018-12-02 DIAGNOSIS — N186 End stage renal disease: Secondary | ICD-10-CM | POA: Diagnosis not present

## 2018-12-03 DIAGNOSIS — Z992 Dependence on renal dialysis: Secondary | ICD-10-CM | POA: Diagnosis not present

## 2018-12-03 DIAGNOSIS — N186 End stage renal disease: Secondary | ICD-10-CM | POA: Diagnosis not present

## 2018-12-04 DIAGNOSIS — N186 End stage renal disease: Secondary | ICD-10-CM | POA: Diagnosis not present

## 2018-12-04 DIAGNOSIS — N2581 Secondary hyperparathyroidism of renal origin: Secondary | ICD-10-CM | POA: Diagnosis not present

## 2018-12-04 DIAGNOSIS — Z992 Dependence on renal dialysis: Secondary | ICD-10-CM | POA: Diagnosis not present

## 2018-12-04 DIAGNOSIS — D472 Monoclonal gammopathy: Secondary | ICD-10-CM | POA: Diagnosis not present

## 2018-12-04 DIAGNOSIS — D631 Anemia in chronic kidney disease: Secondary | ICD-10-CM | POA: Diagnosis not present

## 2018-12-04 DIAGNOSIS — D509 Iron deficiency anemia, unspecified: Secondary | ICD-10-CM | POA: Diagnosis not present

## 2018-12-07 DIAGNOSIS — N2581 Secondary hyperparathyroidism of renal origin: Secondary | ICD-10-CM | POA: Diagnosis not present

## 2018-12-07 DIAGNOSIS — D472 Monoclonal gammopathy: Secondary | ICD-10-CM | POA: Diagnosis not present

## 2018-12-07 DIAGNOSIS — D631 Anemia in chronic kidney disease: Secondary | ICD-10-CM | POA: Diagnosis not present

## 2018-12-07 DIAGNOSIS — Z992 Dependence on renal dialysis: Secondary | ICD-10-CM | POA: Diagnosis not present

## 2018-12-07 DIAGNOSIS — D509 Iron deficiency anemia, unspecified: Secondary | ICD-10-CM | POA: Diagnosis not present

## 2018-12-07 DIAGNOSIS — N186 End stage renal disease: Secondary | ICD-10-CM | POA: Diagnosis not present

## 2018-12-09 DIAGNOSIS — D631 Anemia in chronic kidney disease: Secondary | ICD-10-CM | POA: Diagnosis not present

## 2018-12-09 DIAGNOSIS — D509 Iron deficiency anemia, unspecified: Secondary | ICD-10-CM | POA: Diagnosis not present

## 2018-12-09 DIAGNOSIS — N2581 Secondary hyperparathyroidism of renal origin: Secondary | ICD-10-CM | POA: Diagnosis not present

## 2018-12-09 DIAGNOSIS — D472 Monoclonal gammopathy: Secondary | ICD-10-CM | POA: Diagnosis not present

## 2018-12-09 DIAGNOSIS — Z992 Dependence on renal dialysis: Secondary | ICD-10-CM | POA: Diagnosis not present

## 2018-12-09 DIAGNOSIS — N186 End stage renal disease: Secondary | ICD-10-CM | POA: Diagnosis not present

## 2018-12-11 DIAGNOSIS — Z992 Dependence on renal dialysis: Secondary | ICD-10-CM | POA: Diagnosis not present

## 2018-12-11 DIAGNOSIS — D472 Monoclonal gammopathy: Secondary | ICD-10-CM | POA: Diagnosis not present

## 2018-12-11 DIAGNOSIS — D509 Iron deficiency anemia, unspecified: Secondary | ICD-10-CM | POA: Diagnosis not present

## 2018-12-11 DIAGNOSIS — D631 Anemia in chronic kidney disease: Secondary | ICD-10-CM | POA: Diagnosis not present

## 2018-12-11 DIAGNOSIS — N2581 Secondary hyperparathyroidism of renal origin: Secondary | ICD-10-CM | POA: Diagnosis not present

## 2018-12-11 DIAGNOSIS — N186 End stage renal disease: Secondary | ICD-10-CM | POA: Diagnosis not present

## 2018-12-14 DIAGNOSIS — N186 End stage renal disease: Secondary | ICD-10-CM | POA: Diagnosis not present

## 2018-12-14 DIAGNOSIS — D472 Monoclonal gammopathy: Secondary | ICD-10-CM | POA: Diagnosis not present

## 2018-12-14 DIAGNOSIS — D631 Anemia in chronic kidney disease: Secondary | ICD-10-CM | POA: Diagnosis not present

## 2018-12-14 DIAGNOSIS — Z992 Dependence on renal dialysis: Secondary | ICD-10-CM | POA: Diagnosis not present

## 2018-12-14 DIAGNOSIS — N2581 Secondary hyperparathyroidism of renal origin: Secondary | ICD-10-CM | POA: Diagnosis not present

## 2018-12-14 DIAGNOSIS — D509 Iron deficiency anemia, unspecified: Secondary | ICD-10-CM | POA: Diagnosis not present

## 2018-12-16 DIAGNOSIS — N2581 Secondary hyperparathyroidism of renal origin: Secondary | ICD-10-CM | POA: Diagnosis not present

## 2018-12-16 DIAGNOSIS — D631 Anemia in chronic kidney disease: Secondary | ICD-10-CM | POA: Diagnosis not present

## 2018-12-16 DIAGNOSIS — Z992 Dependence on renal dialysis: Secondary | ICD-10-CM | POA: Diagnosis not present

## 2018-12-16 DIAGNOSIS — D509 Iron deficiency anemia, unspecified: Secondary | ICD-10-CM | POA: Diagnosis not present

## 2018-12-16 DIAGNOSIS — D472 Monoclonal gammopathy: Secondary | ICD-10-CM | POA: Diagnosis not present

## 2018-12-16 DIAGNOSIS — N186 End stage renal disease: Secondary | ICD-10-CM | POA: Diagnosis not present

## 2018-12-17 DIAGNOSIS — H401133 Primary open-angle glaucoma, bilateral, severe stage: Secondary | ICD-10-CM | POA: Diagnosis not present

## 2018-12-18 DIAGNOSIS — Z992 Dependence on renal dialysis: Secondary | ICD-10-CM | POA: Diagnosis not present

## 2018-12-18 DIAGNOSIS — D472 Monoclonal gammopathy: Secondary | ICD-10-CM | POA: Diagnosis not present

## 2018-12-18 DIAGNOSIS — D509 Iron deficiency anemia, unspecified: Secondary | ICD-10-CM | POA: Diagnosis not present

## 2018-12-18 DIAGNOSIS — N2581 Secondary hyperparathyroidism of renal origin: Secondary | ICD-10-CM | POA: Diagnosis not present

## 2018-12-18 DIAGNOSIS — D631 Anemia in chronic kidney disease: Secondary | ICD-10-CM | POA: Diagnosis not present

## 2018-12-18 DIAGNOSIS — N186 End stage renal disease: Secondary | ICD-10-CM | POA: Diagnosis not present

## 2018-12-21 DIAGNOSIS — D509 Iron deficiency anemia, unspecified: Secondary | ICD-10-CM | POA: Diagnosis not present

## 2018-12-21 DIAGNOSIS — Z992 Dependence on renal dialysis: Secondary | ICD-10-CM | POA: Diagnosis not present

## 2018-12-21 DIAGNOSIS — N186 End stage renal disease: Secondary | ICD-10-CM | POA: Diagnosis not present

## 2018-12-21 DIAGNOSIS — D472 Monoclonal gammopathy: Secondary | ICD-10-CM | POA: Diagnosis not present

## 2018-12-21 DIAGNOSIS — N2581 Secondary hyperparathyroidism of renal origin: Secondary | ICD-10-CM | POA: Diagnosis not present

## 2018-12-21 DIAGNOSIS — D631 Anemia in chronic kidney disease: Secondary | ICD-10-CM | POA: Diagnosis not present

## 2018-12-23 DIAGNOSIS — D509 Iron deficiency anemia, unspecified: Secondary | ICD-10-CM | POA: Diagnosis not present

## 2018-12-23 DIAGNOSIS — N2581 Secondary hyperparathyroidism of renal origin: Secondary | ICD-10-CM | POA: Diagnosis not present

## 2018-12-23 DIAGNOSIS — D631 Anemia in chronic kidney disease: Secondary | ICD-10-CM | POA: Diagnosis not present

## 2018-12-23 DIAGNOSIS — Z992 Dependence on renal dialysis: Secondary | ICD-10-CM | POA: Diagnosis not present

## 2018-12-23 DIAGNOSIS — N186 End stage renal disease: Secondary | ICD-10-CM | POA: Diagnosis not present

## 2018-12-23 DIAGNOSIS — D472 Monoclonal gammopathy: Secondary | ICD-10-CM | POA: Diagnosis not present

## 2018-12-25 DIAGNOSIS — D509 Iron deficiency anemia, unspecified: Secondary | ICD-10-CM | POA: Diagnosis not present

## 2018-12-25 DIAGNOSIS — D631 Anemia in chronic kidney disease: Secondary | ICD-10-CM | POA: Diagnosis not present

## 2018-12-25 DIAGNOSIS — N186 End stage renal disease: Secondary | ICD-10-CM | POA: Diagnosis not present

## 2018-12-25 DIAGNOSIS — N2581 Secondary hyperparathyroidism of renal origin: Secondary | ICD-10-CM | POA: Diagnosis not present

## 2018-12-25 DIAGNOSIS — D472 Monoclonal gammopathy: Secondary | ICD-10-CM | POA: Diagnosis not present

## 2018-12-25 DIAGNOSIS — Z992 Dependence on renal dialysis: Secondary | ICD-10-CM | POA: Diagnosis not present

## 2018-12-28 DIAGNOSIS — D631 Anemia in chronic kidney disease: Secondary | ICD-10-CM | POA: Diagnosis not present

## 2018-12-28 DIAGNOSIS — D509 Iron deficiency anemia, unspecified: Secondary | ICD-10-CM | POA: Diagnosis not present

## 2018-12-28 DIAGNOSIS — N2581 Secondary hyperparathyroidism of renal origin: Secondary | ICD-10-CM | POA: Diagnosis not present

## 2018-12-28 DIAGNOSIS — N186 End stage renal disease: Secondary | ICD-10-CM | POA: Diagnosis not present

## 2018-12-28 DIAGNOSIS — Z992 Dependence on renal dialysis: Secondary | ICD-10-CM | POA: Diagnosis not present

## 2018-12-28 DIAGNOSIS — D472 Monoclonal gammopathy: Secondary | ICD-10-CM | POA: Diagnosis not present

## 2018-12-30 DIAGNOSIS — N2581 Secondary hyperparathyroidism of renal origin: Secondary | ICD-10-CM | POA: Diagnosis not present

## 2018-12-30 DIAGNOSIS — N186 End stage renal disease: Secondary | ICD-10-CM | POA: Diagnosis not present

## 2018-12-30 DIAGNOSIS — Z992 Dependence on renal dialysis: Secondary | ICD-10-CM | POA: Diagnosis not present

## 2018-12-30 DIAGNOSIS — D509 Iron deficiency anemia, unspecified: Secondary | ICD-10-CM | POA: Diagnosis not present

## 2018-12-30 DIAGNOSIS — D631 Anemia in chronic kidney disease: Secondary | ICD-10-CM | POA: Diagnosis not present

## 2018-12-30 DIAGNOSIS — D472 Monoclonal gammopathy: Secondary | ICD-10-CM | POA: Diagnosis not present

## 2019-01-01 DIAGNOSIS — N186 End stage renal disease: Secondary | ICD-10-CM | POA: Diagnosis not present

## 2019-01-01 DIAGNOSIS — D472 Monoclonal gammopathy: Secondary | ICD-10-CM | POA: Diagnosis not present

## 2019-01-01 DIAGNOSIS — D509 Iron deficiency anemia, unspecified: Secondary | ICD-10-CM | POA: Diagnosis not present

## 2019-01-01 DIAGNOSIS — D631 Anemia in chronic kidney disease: Secondary | ICD-10-CM | POA: Diagnosis not present

## 2019-01-01 DIAGNOSIS — Z992 Dependence on renal dialysis: Secondary | ICD-10-CM | POA: Diagnosis not present

## 2019-01-01 DIAGNOSIS — N2581 Secondary hyperparathyroidism of renal origin: Secondary | ICD-10-CM | POA: Diagnosis not present

## 2019-01-03 DIAGNOSIS — N186 End stage renal disease: Secondary | ICD-10-CM | POA: Diagnosis not present

## 2019-01-03 DIAGNOSIS — Z992 Dependence on renal dialysis: Secondary | ICD-10-CM | POA: Diagnosis not present

## 2019-01-04 DIAGNOSIS — N186 End stage renal disease: Secondary | ICD-10-CM | POA: Diagnosis not present

## 2019-01-04 DIAGNOSIS — D631 Anemia in chronic kidney disease: Secondary | ICD-10-CM | POA: Diagnosis not present

## 2019-01-04 DIAGNOSIS — N2581 Secondary hyperparathyroidism of renal origin: Secondary | ICD-10-CM | POA: Diagnosis not present

## 2019-01-04 DIAGNOSIS — D472 Monoclonal gammopathy: Secondary | ICD-10-CM | POA: Diagnosis not present

## 2019-01-04 DIAGNOSIS — D509 Iron deficiency anemia, unspecified: Secondary | ICD-10-CM | POA: Diagnosis not present

## 2019-01-04 DIAGNOSIS — Z992 Dependence on renal dialysis: Secondary | ICD-10-CM | POA: Diagnosis not present

## 2019-01-06 DIAGNOSIS — D509 Iron deficiency anemia, unspecified: Secondary | ICD-10-CM | POA: Diagnosis not present

## 2019-01-06 DIAGNOSIS — N186 End stage renal disease: Secondary | ICD-10-CM | POA: Diagnosis not present

## 2019-01-06 DIAGNOSIS — Z992 Dependence on renal dialysis: Secondary | ICD-10-CM | POA: Diagnosis not present

## 2019-01-06 DIAGNOSIS — D472 Monoclonal gammopathy: Secondary | ICD-10-CM | POA: Diagnosis not present

## 2019-01-06 DIAGNOSIS — N2581 Secondary hyperparathyroidism of renal origin: Secondary | ICD-10-CM | POA: Diagnosis not present

## 2019-01-06 DIAGNOSIS — D631 Anemia in chronic kidney disease: Secondary | ICD-10-CM | POA: Diagnosis not present

## 2019-01-08 DIAGNOSIS — D631 Anemia in chronic kidney disease: Secondary | ICD-10-CM | POA: Diagnosis not present

## 2019-01-08 DIAGNOSIS — N186 End stage renal disease: Secondary | ICD-10-CM | POA: Diagnosis not present

## 2019-01-08 DIAGNOSIS — N2581 Secondary hyperparathyroidism of renal origin: Secondary | ICD-10-CM | POA: Diagnosis not present

## 2019-01-08 DIAGNOSIS — Z992 Dependence on renal dialysis: Secondary | ICD-10-CM | POA: Diagnosis not present

## 2019-01-08 DIAGNOSIS — D509 Iron deficiency anemia, unspecified: Secondary | ICD-10-CM | POA: Diagnosis not present

## 2019-01-08 DIAGNOSIS — D472 Monoclonal gammopathy: Secondary | ICD-10-CM | POA: Diagnosis not present

## 2019-01-11 DIAGNOSIS — Z992 Dependence on renal dialysis: Secondary | ICD-10-CM | POA: Diagnosis not present

## 2019-01-11 DIAGNOSIS — D472 Monoclonal gammopathy: Secondary | ICD-10-CM | POA: Diagnosis not present

## 2019-01-11 DIAGNOSIS — N186 End stage renal disease: Secondary | ICD-10-CM | POA: Diagnosis not present

## 2019-01-11 DIAGNOSIS — D631 Anemia in chronic kidney disease: Secondary | ICD-10-CM | POA: Diagnosis not present

## 2019-01-11 DIAGNOSIS — N2581 Secondary hyperparathyroidism of renal origin: Secondary | ICD-10-CM | POA: Diagnosis not present

## 2019-01-11 DIAGNOSIS — D509 Iron deficiency anemia, unspecified: Secondary | ICD-10-CM | POA: Diagnosis not present

## 2019-01-13 DIAGNOSIS — D631 Anemia in chronic kidney disease: Secondary | ICD-10-CM | POA: Diagnosis not present

## 2019-01-13 DIAGNOSIS — D472 Monoclonal gammopathy: Secondary | ICD-10-CM | POA: Diagnosis not present

## 2019-01-13 DIAGNOSIS — N186 End stage renal disease: Secondary | ICD-10-CM | POA: Diagnosis not present

## 2019-01-13 DIAGNOSIS — D509 Iron deficiency anemia, unspecified: Secondary | ICD-10-CM | POA: Diagnosis not present

## 2019-01-13 DIAGNOSIS — Z992 Dependence on renal dialysis: Secondary | ICD-10-CM | POA: Diagnosis not present

## 2019-01-13 DIAGNOSIS — N2581 Secondary hyperparathyroidism of renal origin: Secondary | ICD-10-CM | POA: Diagnosis not present

## 2019-01-15 DIAGNOSIS — D631 Anemia in chronic kidney disease: Secondary | ICD-10-CM | POA: Diagnosis not present

## 2019-01-15 DIAGNOSIS — D472 Monoclonal gammopathy: Secondary | ICD-10-CM | POA: Diagnosis not present

## 2019-01-15 DIAGNOSIS — Z992 Dependence on renal dialysis: Secondary | ICD-10-CM | POA: Diagnosis not present

## 2019-01-15 DIAGNOSIS — D509 Iron deficiency anemia, unspecified: Secondary | ICD-10-CM | POA: Diagnosis not present

## 2019-01-15 DIAGNOSIS — N2581 Secondary hyperparathyroidism of renal origin: Secondary | ICD-10-CM | POA: Diagnosis not present

## 2019-01-15 DIAGNOSIS — N186 End stage renal disease: Secondary | ICD-10-CM | POA: Diagnosis not present

## 2019-01-18 DIAGNOSIS — D472 Monoclonal gammopathy: Secondary | ICD-10-CM | POA: Diagnosis not present

## 2019-01-18 DIAGNOSIS — D509 Iron deficiency anemia, unspecified: Secondary | ICD-10-CM | POA: Diagnosis not present

## 2019-01-18 DIAGNOSIS — N186 End stage renal disease: Secondary | ICD-10-CM | POA: Diagnosis not present

## 2019-01-18 DIAGNOSIS — N2581 Secondary hyperparathyroidism of renal origin: Secondary | ICD-10-CM | POA: Diagnosis not present

## 2019-01-18 DIAGNOSIS — D631 Anemia in chronic kidney disease: Secondary | ICD-10-CM | POA: Diagnosis not present

## 2019-01-18 DIAGNOSIS — Z992 Dependence on renal dialysis: Secondary | ICD-10-CM | POA: Diagnosis not present

## 2019-01-20 DIAGNOSIS — D509 Iron deficiency anemia, unspecified: Secondary | ICD-10-CM | POA: Diagnosis not present

## 2019-01-20 DIAGNOSIS — D631 Anemia in chronic kidney disease: Secondary | ICD-10-CM | POA: Diagnosis not present

## 2019-01-20 DIAGNOSIS — N186 End stage renal disease: Secondary | ICD-10-CM | POA: Diagnosis not present

## 2019-01-20 DIAGNOSIS — Z992 Dependence on renal dialysis: Secondary | ICD-10-CM | POA: Diagnosis not present

## 2019-01-20 DIAGNOSIS — N2581 Secondary hyperparathyroidism of renal origin: Secondary | ICD-10-CM | POA: Diagnosis not present

## 2019-01-20 DIAGNOSIS — D472 Monoclonal gammopathy: Secondary | ICD-10-CM | POA: Diagnosis not present

## 2019-01-22 DIAGNOSIS — D509 Iron deficiency anemia, unspecified: Secondary | ICD-10-CM | POA: Diagnosis not present

## 2019-01-22 DIAGNOSIS — Z992 Dependence on renal dialysis: Secondary | ICD-10-CM | POA: Diagnosis not present

## 2019-01-22 DIAGNOSIS — N186 End stage renal disease: Secondary | ICD-10-CM | POA: Diagnosis not present

## 2019-01-22 DIAGNOSIS — D472 Monoclonal gammopathy: Secondary | ICD-10-CM | POA: Diagnosis not present

## 2019-01-22 DIAGNOSIS — D631 Anemia in chronic kidney disease: Secondary | ICD-10-CM | POA: Diagnosis not present

## 2019-01-22 DIAGNOSIS — N2581 Secondary hyperparathyroidism of renal origin: Secondary | ICD-10-CM | POA: Diagnosis not present

## 2019-01-25 DIAGNOSIS — N186 End stage renal disease: Secondary | ICD-10-CM | POA: Diagnosis not present

## 2019-01-25 DIAGNOSIS — D509 Iron deficiency anemia, unspecified: Secondary | ICD-10-CM | POA: Diagnosis not present

## 2019-01-25 DIAGNOSIS — Z992 Dependence on renal dialysis: Secondary | ICD-10-CM | POA: Diagnosis not present

## 2019-01-25 DIAGNOSIS — D472 Monoclonal gammopathy: Secondary | ICD-10-CM | POA: Diagnosis not present

## 2019-01-25 DIAGNOSIS — D631 Anemia in chronic kidney disease: Secondary | ICD-10-CM | POA: Diagnosis not present

## 2019-01-25 DIAGNOSIS — N2581 Secondary hyperparathyroidism of renal origin: Secondary | ICD-10-CM | POA: Diagnosis not present

## 2019-01-27 DIAGNOSIS — N2581 Secondary hyperparathyroidism of renal origin: Secondary | ICD-10-CM | POA: Diagnosis not present

## 2019-01-27 DIAGNOSIS — N186 End stage renal disease: Secondary | ICD-10-CM | POA: Diagnosis not present

## 2019-01-27 DIAGNOSIS — D631 Anemia in chronic kidney disease: Secondary | ICD-10-CM | POA: Diagnosis not present

## 2019-01-27 DIAGNOSIS — Z992 Dependence on renal dialysis: Secondary | ICD-10-CM | POA: Diagnosis not present

## 2019-01-27 DIAGNOSIS — D472 Monoclonal gammopathy: Secondary | ICD-10-CM | POA: Diagnosis not present

## 2019-01-27 DIAGNOSIS — D509 Iron deficiency anemia, unspecified: Secondary | ICD-10-CM | POA: Diagnosis not present

## 2019-01-29 DIAGNOSIS — Z992 Dependence on renal dialysis: Secondary | ICD-10-CM | POA: Diagnosis not present

## 2019-01-29 DIAGNOSIS — N186 End stage renal disease: Secondary | ICD-10-CM | POA: Diagnosis not present

## 2019-01-29 DIAGNOSIS — D631 Anemia in chronic kidney disease: Secondary | ICD-10-CM | POA: Diagnosis not present

## 2019-01-29 DIAGNOSIS — N2581 Secondary hyperparathyroidism of renal origin: Secondary | ICD-10-CM | POA: Diagnosis not present

## 2019-01-29 DIAGNOSIS — D509 Iron deficiency anemia, unspecified: Secondary | ICD-10-CM | POA: Diagnosis not present

## 2019-01-29 DIAGNOSIS — D472 Monoclonal gammopathy: Secondary | ICD-10-CM | POA: Diagnosis not present

## 2019-02-01 DIAGNOSIS — N2581 Secondary hyperparathyroidism of renal origin: Secondary | ICD-10-CM | POA: Diagnosis not present

## 2019-02-01 DIAGNOSIS — D631 Anemia in chronic kidney disease: Secondary | ICD-10-CM | POA: Diagnosis not present

## 2019-02-01 DIAGNOSIS — D472 Monoclonal gammopathy: Secondary | ICD-10-CM | POA: Diagnosis not present

## 2019-02-01 DIAGNOSIS — N186 End stage renal disease: Secondary | ICD-10-CM | POA: Diagnosis not present

## 2019-02-01 DIAGNOSIS — D509 Iron deficiency anemia, unspecified: Secondary | ICD-10-CM | POA: Diagnosis not present

## 2019-02-01 DIAGNOSIS — Z992 Dependence on renal dialysis: Secondary | ICD-10-CM | POA: Diagnosis not present

## 2019-02-02 DIAGNOSIS — N186 End stage renal disease: Secondary | ICD-10-CM | POA: Diagnosis not present

## 2019-02-02 DIAGNOSIS — Z992 Dependence on renal dialysis: Secondary | ICD-10-CM | POA: Diagnosis not present

## 2019-02-03 DIAGNOSIS — D631 Anemia in chronic kidney disease: Secondary | ICD-10-CM | POA: Diagnosis not present

## 2019-02-03 DIAGNOSIS — D509 Iron deficiency anemia, unspecified: Secondary | ICD-10-CM | POA: Diagnosis not present

## 2019-02-03 DIAGNOSIS — N186 End stage renal disease: Secondary | ICD-10-CM | POA: Diagnosis not present

## 2019-02-03 DIAGNOSIS — Z992 Dependence on renal dialysis: Secondary | ICD-10-CM | POA: Diagnosis not present

## 2019-02-03 DIAGNOSIS — N2581 Secondary hyperparathyroidism of renal origin: Secondary | ICD-10-CM | POA: Diagnosis not present

## 2019-02-03 DIAGNOSIS — D472 Monoclonal gammopathy: Secondary | ICD-10-CM | POA: Diagnosis not present

## 2019-02-05 DIAGNOSIS — D472 Monoclonal gammopathy: Secondary | ICD-10-CM | POA: Diagnosis not present

## 2019-02-05 DIAGNOSIS — D631 Anemia in chronic kidney disease: Secondary | ICD-10-CM | POA: Diagnosis not present

## 2019-02-05 DIAGNOSIS — N2581 Secondary hyperparathyroidism of renal origin: Secondary | ICD-10-CM | POA: Diagnosis not present

## 2019-02-05 DIAGNOSIS — Z992 Dependence on renal dialysis: Secondary | ICD-10-CM | POA: Diagnosis not present

## 2019-02-05 DIAGNOSIS — N186 End stage renal disease: Secondary | ICD-10-CM | POA: Diagnosis not present

## 2019-02-05 DIAGNOSIS — D509 Iron deficiency anemia, unspecified: Secondary | ICD-10-CM | POA: Diagnosis not present

## 2019-02-08 DIAGNOSIS — D509 Iron deficiency anemia, unspecified: Secondary | ICD-10-CM | POA: Diagnosis not present

## 2019-02-08 DIAGNOSIS — N186 End stage renal disease: Secondary | ICD-10-CM | POA: Diagnosis not present

## 2019-02-08 DIAGNOSIS — D472 Monoclonal gammopathy: Secondary | ICD-10-CM | POA: Diagnosis not present

## 2019-02-08 DIAGNOSIS — N2581 Secondary hyperparathyroidism of renal origin: Secondary | ICD-10-CM | POA: Diagnosis not present

## 2019-02-08 DIAGNOSIS — D631 Anemia in chronic kidney disease: Secondary | ICD-10-CM | POA: Diagnosis not present

## 2019-02-08 DIAGNOSIS — Z992 Dependence on renal dialysis: Secondary | ICD-10-CM | POA: Diagnosis not present

## 2019-02-10 DIAGNOSIS — D631 Anemia in chronic kidney disease: Secondary | ICD-10-CM | POA: Diagnosis not present

## 2019-02-10 DIAGNOSIS — N2581 Secondary hyperparathyroidism of renal origin: Secondary | ICD-10-CM | POA: Diagnosis not present

## 2019-02-10 DIAGNOSIS — Z992 Dependence on renal dialysis: Secondary | ICD-10-CM | POA: Diagnosis not present

## 2019-02-10 DIAGNOSIS — D472 Monoclonal gammopathy: Secondary | ICD-10-CM | POA: Diagnosis not present

## 2019-02-10 DIAGNOSIS — N186 End stage renal disease: Secondary | ICD-10-CM | POA: Diagnosis not present

## 2019-02-10 DIAGNOSIS — D509 Iron deficiency anemia, unspecified: Secondary | ICD-10-CM | POA: Diagnosis not present

## 2019-02-12 DIAGNOSIS — N186 End stage renal disease: Secondary | ICD-10-CM | POA: Diagnosis not present

## 2019-02-12 DIAGNOSIS — Z992 Dependence on renal dialysis: Secondary | ICD-10-CM | POA: Diagnosis not present

## 2019-02-12 DIAGNOSIS — D472 Monoclonal gammopathy: Secondary | ICD-10-CM | POA: Diagnosis not present

## 2019-02-12 DIAGNOSIS — N2581 Secondary hyperparathyroidism of renal origin: Secondary | ICD-10-CM | POA: Diagnosis not present

## 2019-02-12 DIAGNOSIS — D509 Iron deficiency anemia, unspecified: Secondary | ICD-10-CM | POA: Diagnosis not present

## 2019-02-12 DIAGNOSIS — D631 Anemia in chronic kidney disease: Secondary | ICD-10-CM | POA: Diagnosis not present

## 2019-02-15 DIAGNOSIS — D631 Anemia in chronic kidney disease: Secondary | ICD-10-CM | POA: Diagnosis not present

## 2019-02-15 DIAGNOSIS — N186 End stage renal disease: Secondary | ICD-10-CM | POA: Diagnosis not present

## 2019-02-15 DIAGNOSIS — Z992 Dependence on renal dialysis: Secondary | ICD-10-CM | POA: Diagnosis not present

## 2019-02-15 DIAGNOSIS — N2581 Secondary hyperparathyroidism of renal origin: Secondary | ICD-10-CM | POA: Diagnosis not present

## 2019-02-15 DIAGNOSIS — D472 Monoclonal gammopathy: Secondary | ICD-10-CM | POA: Diagnosis not present

## 2019-02-15 DIAGNOSIS — D509 Iron deficiency anemia, unspecified: Secondary | ICD-10-CM | POA: Diagnosis not present

## 2019-02-17 DIAGNOSIS — D509 Iron deficiency anemia, unspecified: Secondary | ICD-10-CM | POA: Diagnosis not present

## 2019-02-17 DIAGNOSIS — Z992 Dependence on renal dialysis: Secondary | ICD-10-CM | POA: Diagnosis not present

## 2019-02-17 DIAGNOSIS — D472 Monoclonal gammopathy: Secondary | ICD-10-CM | POA: Diagnosis not present

## 2019-02-17 DIAGNOSIS — D631 Anemia in chronic kidney disease: Secondary | ICD-10-CM | POA: Diagnosis not present

## 2019-02-17 DIAGNOSIS — N2581 Secondary hyperparathyroidism of renal origin: Secondary | ICD-10-CM | POA: Diagnosis not present

## 2019-02-17 DIAGNOSIS — N186 End stage renal disease: Secondary | ICD-10-CM | POA: Diagnosis not present

## 2019-02-19 DIAGNOSIS — Z992 Dependence on renal dialysis: Secondary | ICD-10-CM | POA: Diagnosis not present

## 2019-02-19 DIAGNOSIS — D631 Anemia in chronic kidney disease: Secondary | ICD-10-CM | POA: Diagnosis not present

## 2019-02-19 DIAGNOSIS — N186 End stage renal disease: Secondary | ICD-10-CM | POA: Diagnosis not present

## 2019-02-19 DIAGNOSIS — D472 Monoclonal gammopathy: Secondary | ICD-10-CM | POA: Diagnosis not present

## 2019-02-19 DIAGNOSIS — D509 Iron deficiency anemia, unspecified: Secondary | ICD-10-CM | POA: Diagnosis not present

## 2019-02-19 DIAGNOSIS — N2581 Secondary hyperparathyroidism of renal origin: Secondary | ICD-10-CM | POA: Diagnosis not present

## 2019-02-22 DIAGNOSIS — D472 Monoclonal gammopathy: Secondary | ICD-10-CM | POA: Diagnosis not present

## 2019-02-22 DIAGNOSIS — Z992 Dependence on renal dialysis: Secondary | ICD-10-CM | POA: Diagnosis not present

## 2019-02-22 DIAGNOSIS — D631 Anemia in chronic kidney disease: Secondary | ICD-10-CM | POA: Diagnosis not present

## 2019-02-22 DIAGNOSIS — D509 Iron deficiency anemia, unspecified: Secondary | ICD-10-CM | POA: Diagnosis not present

## 2019-02-22 DIAGNOSIS — N186 End stage renal disease: Secondary | ICD-10-CM | POA: Diagnosis not present

## 2019-02-22 DIAGNOSIS — N2581 Secondary hyperparathyroidism of renal origin: Secondary | ICD-10-CM | POA: Diagnosis not present

## 2019-02-24 DIAGNOSIS — D631 Anemia in chronic kidney disease: Secondary | ICD-10-CM | POA: Diagnosis not present

## 2019-02-24 DIAGNOSIS — Z992 Dependence on renal dialysis: Secondary | ICD-10-CM | POA: Diagnosis not present

## 2019-02-24 DIAGNOSIS — N2581 Secondary hyperparathyroidism of renal origin: Secondary | ICD-10-CM | POA: Diagnosis not present

## 2019-02-24 DIAGNOSIS — D509 Iron deficiency anemia, unspecified: Secondary | ICD-10-CM | POA: Diagnosis not present

## 2019-02-24 DIAGNOSIS — D472 Monoclonal gammopathy: Secondary | ICD-10-CM | POA: Diagnosis not present

## 2019-02-24 DIAGNOSIS — N186 End stage renal disease: Secondary | ICD-10-CM | POA: Diagnosis not present

## 2019-02-26 DIAGNOSIS — D472 Monoclonal gammopathy: Secondary | ICD-10-CM | POA: Diagnosis not present

## 2019-02-26 DIAGNOSIS — Z992 Dependence on renal dialysis: Secondary | ICD-10-CM | POA: Diagnosis not present

## 2019-02-26 DIAGNOSIS — D509 Iron deficiency anemia, unspecified: Secondary | ICD-10-CM | POA: Diagnosis not present

## 2019-02-26 DIAGNOSIS — N186 End stage renal disease: Secondary | ICD-10-CM | POA: Diagnosis not present

## 2019-02-26 DIAGNOSIS — D631 Anemia in chronic kidney disease: Secondary | ICD-10-CM | POA: Diagnosis not present

## 2019-02-26 DIAGNOSIS — N2581 Secondary hyperparathyroidism of renal origin: Secondary | ICD-10-CM | POA: Diagnosis not present

## 2019-03-01 DIAGNOSIS — N2581 Secondary hyperparathyroidism of renal origin: Secondary | ICD-10-CM | POA: Diagnosis not present

## 2019-03-01 DIAGNOSIS — D509 Iron deficiency anemia, unspecified: Secondary | ICD-10-CM | POA: Diagnosis not present

## 2019-03-01 DIAGNOSIS — Z992 Dependence on renal dialysis: Secondary | ICD-10-CM | POA: Diagnosis not present

## 2019-03-01 DIAGNOSIS — D472 Monoclonal gammopathy: Secondary | ICD-10-CM | POA: Diagnosis not present

## 2019-03-01 DIAGNOSIS — D631 Anemia in chronic kidney disease: Secondary | ICD-10-CM | POA: Diagnosis not present

## 2019-03-01 DIAGNOSIS — N186 End stage renal disease: Secondary | ICD-10-CM | POA: Diagnosis not present

## 2019-03-03 DIAGNOSIS — D631 Anemia in chronic kidney disease: Secondary | ICD-10-CM | POA: Diagnosis not present

## 2019-03-03 DIAGNOSIS — D509 Iron deficiency anemia, unspecified: Secondary | ICD-10-CM | POA: Diagnosis not present

## 2019-03-03 DIAGNOSIS — N2581 Secondary hyperparathyroidism of renal origin: Secondary | ICD-10-CM | POA: Diagnosis not present

## 2019-03-03 DIAGNOSIS — N186 End stage renal disease: Secondary | ICD-10-CM | POA: Diagnosis not present

## 2019-03-03 DIAGNOSIS — Z992 Dependence on renal dialysis: Secondary | ICD-10-CM | POA: Diagnosis not present

## 2019-03-03 DIAGNOSIS — D472 Monoclonal gammopathy: Secondary | ICD-10-CM | POA: Diagnosis not present

## 2019-03-05 DIAGNOSIS — N186 End stage renal disease: Secondary | ICD-10-CM | POA: Diagnosis not present

## 2019-03-05 DIAGNOSIS — Z992 Dependence on renal dialysis: Secondary | ICD-10-CM | POA: Diagnosis not present

## 2019-03-05 DIAGNOSIS — N2581 Secondary hyperparathyroidism of renal origin: Secondary | ICD-10-CM | POA: Diagnosis not present

## 2019-03-05 DIAGNOSIS — D509 Iron deficiency anemia, unspecified: Secondary | ICD-10-CM | POA: Diagnosis not present

## 2019-03-05 DIAGNOSIS — D631 Anemia in chronic kidney disease: Secondary | ICD-10-CM | POA: Diagnosis not present

## 2019-03-05 DIAGNOSIS — D472 Monoclonal gammopathy: Secondary | ICD-10-CM | POA: Diagnosis not present

## 2019-03-08 DIAGNOSIS — D472 Monoclonal gammopathy: Secondary | ICD-10-CM | POA: Diagnosis not present

## 2019-03-08 DIAGNOSIS — D509 Iron deficiency anemia, unspecified: Secondary | ICD-10-CM | POA: Diagnosis not present

## 2019-03-08 DIAGNOSIS — D631 Anemia in chronic kidney disease: Secondary | ICD-10-CM | POA: Diagnosis not present

## 2019-03-08 DIAGNOSIS — N186 End stage renal disease: Secondary | ICD-10-CM | POA: Diagnosis not present

## 2019-03-08 DIAGNOSIS — N2581 Secondary hyperparathyroidism of renal origin: Secondary | ICD-10-CM | POA: Diagnosis not present

## 2019-03-08 DIAGNOSIS — Z992 Dependence on renal dialysis: Secondary | ICD-10-CM | POA: Diagnosis not present

## 2019-03-10 DIAGNOSIS — D472 Monoclonal gammopathy: Secondary | ICD-10-CM | POA: Diagnosis not present

## 2019-03-10 DIAGNOSIS — N186 End stage renal disease: Secondary | ICD-10-CM | POA: Diagnosis not present

## 2019-03-10 DIAGNOSIS — Z992 Dependence on renal dialysis: Secondary | ICD-10-CM | POA: Diagnosis not present

## 2019-03-10 DIAGNOSIS — D631 Anemia in chronic kidney disease: Secondary | ICD-10-CM | POA: Diagnosis not present

## 2019-03-10 DIAGNOSIS — N2581 Secondary hyperparathyroidism of renal origin: Secondary | ICD-10-CM | POA: Diagnosis not present

## 2019-03-10 DIAGNOSIS — D509 Iron deficiency anemia, unspecified: Secondary | ICD-10-CM | POA: Diagnosis not present

## 2019-03-12 DIAGNOSIS — D509 Iron deficiency anemia, unspecified: Secondary | ICD-10-CM | POA: Diagnosis not present

## 2019-03-12 DIAGNOSIS — N186 End stage renal disease: Secondary | ICD-10-CM | POA: Diagnosis not present

## 2019-03-12 DIAGNOSIS — Z992 Dependence on renal dialysis: Secondary | ICD-10-CM | POA: Diagnosis not present

## 2019-03-12 DIAGNOSIS — D472 Monoclonal gammopathy: Secondary | ICD-10-CM | POA: Diagnosis not present

## 2019-03-12 DIAGNOSIS — D631 Anemia in chronic kidney disease: Secondary | ICD-10-CM | POA: Diagnosis not present

## 2019-03-12 DIAGNOSIS — N2581 Secondary hyperparathyroidism of renal origin: Secondary | ICD-10-CM | POA: Diagnosis not present

## 2019-03-15 DIAGNOSIS — D509 Iron deficiency anemia, unspecified: Secondary | ICD-10-CM | POA: Diagnosis not present

## 2019-03-15 DIAGNOSIS — N2581 Secondary hyperparathyroidism of renal origin: Secondary | ICD-10-CM | POA: Diagnosis not present

## 2019-03-15 DIAGNOSIS — D472 Monoclonal gammopathy: Secondary | ICD-10-CM | POA: Diagnosis not present

## 2019-03-15 DIAGNOSIS — N186 End stage renal disease: Secondary | ICD-10-CM | POA: Diagnosis not present

## 2019-03-15 DIAGNOSIS — Z992 Dependence on renal dialysis: Secondary | ICD-10-CM | POA: Diagnosis not present

## 2019-03-15 DIAGNOSIS — D631 Anemia in chronic kidney disease: Secondary | ICD-10-CM | POA: Diagnosis not present

## 2019-03-17 DIAGNOSIS — N2581 Secondary hyperparathyroidism of renal origin: Secondary | ICD-10-CM | POA: Diagnosis not present

## 2019-03-17 DIAGNOSIS — D509 Iron deficiency anemia, unspecified: Secondary | ICD-10-CM | POA: Diagnosis not present

## 2019-03-17 DIAGNOSIS — D472 Monoclonal gammopathy: Secondary | ICD-10-CM | POA: Diagnosis not present

## 2019-03-17 DIAGNOSIS — N186 End stage renal disease: Secondary | ICD-10-CM | POA: Diagnosis not present

## 2019-03-17 DIAGNOSIS — D631 Anemia in chronic kidney disease: Secondary | ICD-10-CM | POA: Diagnosis not present

## 2019-03-17 DIAGNOSIS — Z992 Dependence on renal dialysis: Secondary | ICD-10-CM | POA: Diagnosis not present

## 2019-03-18 DIAGNOSIS — N186 End stage renal disease: Secondary | ICD-10-CM | POA: Diagnosis not present

## 2019-03-18 DIAGNOSIS — D472 Monoclonal gammopathy: Secondary | ICD-10-CM | POA: Diagnosis not present

## 2019-03-18 DIAGNOSIS — N2581 Secondary hyperparathyroidism of renal origin: Secondary | ICD-10-CM | POA: Diagnosis not present

## 2019-03-18 DIAGNOSIS — D631 Anemia in chronic kidney disease: Secondary | ICD-10-CM | POA: Diagnosis not present

## 2019-03-18 DIAGNOSIS — Z992 Dependence on renal dialysis: Secondary | ICD-10-CM | POA: Diagnosis not present

## 2019-03-18 DIAGNOSIS — D509 Iron deficiency anemia, unspecified: Secondary | ICD-10-CM | POA: Diagnosis not present

## 2019-03-19 DIAGNOSIS — D509 Iron deficiency anemia, unspecified: Secondary | ICD-10-CM | POA: Diagnosis not present

## 2019-03-19 DIAGNOSIS — D472 Monoclonal gammopathy: Secondary | ICD-10-CM | POA: Diagnosis not present

## 2019-03-19 DIAGNOSIS — Z992 Dependence on renal dialysis: Secondary | ICD-10-CM | POA: Diagnosis not present

## 2019-03-19 DIAGNOSIS — N186 End stage renal disease: Secondary | ICD-10-CM | POA: Diagnosis not present

## 2019-03-19 DIAGNOSIS — N2581 Secondary hyperparathyroidism of renal origin: Secondary | ICD-10-CM | POA: Diagnosis not present

## 2019-03-19 DIAGNOSIS — D631 Anemia in chronic kidney disease: Secondary | ICD-10-CM | POA: Diagnosis not present

## 2019-03-22 DIAGNOSIS — N2581 Secondary hyperparathyroidism of renal origin: Secondary | ICD-10-CM | POA: Diagnosis not present

## 2019-03-22 DIAGNOSIS — D509 Iron deficiency anemia, unspecified: Secondary | ICD-10-CM | POA: Diagnosis not present

## 2019-03-22 DIAGNOSIS — D631 Anemia in chronic kidney disease: Secondary | ICD-10-CM | POA: Diagnosis not present

## 2019-03-22 DIAGNOSIS — Z992 Dependence on renal dialysis: Secondary | ICD-10-CM | POA: Diagnosis not present

## 2019-03-22 DIAGNOSIS — N186 End stage renal disease: Secondary | ICD-10-CM | POA: Diagnosis not present

## 2019-03-22 DIAGNOSIS — D472 Monoclonal gammopathy: Secondary | ICD-10-CM | POA: Diagnosis not present

## 2019-03-24 DIAGNOSIS — D509 Iron deficiency anemia, unspecified: Secondary | ICD-10-CM | POA: Diagnosis not present

## 2019-03-24 DIAGNOSIS — N2581 Secondary hyperparathyroidism of renal origin: Secondary | ICD-10-CM | POA: Diagnosis not present

## 2019-03-24 DIAGNOSIS — Z992 Dependence on renal dialysis: Secondary | ICD-10-CM | POA: Diagnosis not present

## 2019-03-24 DIAGNOSIS — D631 Anemia in chronic kidney disease: Secondary | ICD-10-CM | POA: Diagnosis not present

## 2019-03-24 DIAGNOSIS — D472 Monoclonal gammopathy: Secondary | ICD-10-CM | POA: Diagnosis not present

## 2019-03-24 DIAGNOSIS — N186 End stage renal disease: Secondary | ICD-10-CM | POA: Diagnosis not present

## 2019-03-26 DIAGNOSIS — D472 Monoclonal gammopathy: Secondary | ICD-10-CM | POA: Diagnosis not present

## 2019-03-26 DIAGNOSIS — N2581 Secondary hyperparathyroidism of renal origin: Secondary | ICD-10-CM | POA: Diagnosis not present

## 2019-03-26 DIAGNOSIS — D509 Iron deficiency anemia, unspecified: Secondary | ICD-10-CM | POA: Diagnosis not present

## 2019-03-26 DIAGNOSIS — D631 Anemia in chronic kidney disease: Secondary | ICD-10-CM | POA: Diagnosis not present

## 2019-03-26 DIAGNOSIS — N186 End stage renal disease: Secondary | ICD-10-CM | POA: Diagnosis not present

## 2019-03-26 DIAGNOSIS — Z992 Dependence on renal dialysis: Secondary | ICD-10-CM | POA: Diagnosis not present

## 2019-03-29 DIAGNOSIS — D509 Iron deficiency anemia, unspecified: Secondary | ICD-10-CM | POA: Diagnosis not present

## 2019-03-29 DIAGNOSIS — N186 End stage renal disease: Secondary | ICD-10-CM | POA: Diagnosis not present

## 2019-03-29 DIAGNOSIS — N2581 Secondary hyperparathyroidism of renal origin: Secondary | ICD-10-CM | POA: Diagnosis not present

## 2019-03-29 DIAGNOSIS — D472 Monoclonal gammopathy: Secondary | ICD-10-CM | POA: Diagnosis not present

## 2019-03-29 DIAGNOSIS — D631 Anemia in chronic kidney disease: Secondary | ICD-10-CM | POA: Diagnosis not present

## 2019-03-29 DIAGNOSIS — Z992 Dependence on renal dialysis: Secondary | ICD-10-CM | POA: Diagnosis not present

## 2019-03-31 DIAGNOSIS — N186 End stage renal disease: Secondary | ICD-10-CM | POA: Diagnosis not present

## 2019-03-31 DIAGNOSIS — D509 Iron deficiency anemia, unspecified: Secondary | ICD-10-CM | POA: Diagnosis not present

## 2019-03-31 DIAGNOSIS — D631 Anemia in chronic kidney disease: Secondary | ICD-10-CM | POA: Diagnosis not present

## 2019-03-31 DIAGNOSIS — D472 Monoclonal gammopathy: Secondary | ICD-10-CM | POA: Diagnosis not present

## 2019-03-31 DIAGNOSIS — N2581 Secondary hyperparathyroidism of renal origin: Secondary | ICD-10-CM | POA: Diagnosis not present

## 2019-03-31 DIAGNOSIS — Z992 Dependence on renal dialysis: Secondary | ICD-10-CM | POA: Diagnosis not present

## 2019-04-02 DIAGNOSIS — Z992 Dependence on renal dialysis: Secondary | ICD-10-CM | POA: Diagnosis not present

## 2019-04-02 DIAGNOSIS — D472 Monoclonal gammopathy: Secondary | ICD-10-CM | POA: Diagnosis not present

## 2019-04-02 DIAGNOSIS — N186 End stage renal disease: Secondary | ICD-10-CM | POA: Diagnosis not present

## 2019-04-02 DIAGNOSIS — N2581 Secondary hyperparathyroidism of renal origin: Secondary | ICD-10-CM | POA: Diagnosis not present

## 2019-04-02 DIAGNOSIS — D509 Iron deficiency anemia, unspecified: Secondary | ICD-10-CM | POA: Diagnosis not present

## 2019-04-02 DIAGNOSIS — D631 Anemia in chronic kidney disease: Secondary | ICD-10-CM | POA: Diagnosis not present

## 2019-04-05 DIAGNOSIS — Z992 Dependence on renal dialysis: Secondary | ICD-10-CM | POA: Diagnosis not present

## 2019-04-05 DIAGNOSIS — D472 Monoclonal gammopathy: Secondary | ICD-10-CM | POA: Diagnosis not present

## 2019-04-05 DIAGNOSIS — D509 Iron deficiency anemia, unspecified: Secondary | ICD-10-CM | POA: Diagnosis not present

## 2019-04-05 DIAGNOSIS — N2581 Secondary hyperparathyroidism of renal origin: Secondary | ICD-10-CM | POA: Diagnosis not present

## 2019-04-05 DIAGNOSIS — N186 End stage renal disease: Secondary | ICD-10-CM | POA: Diagnosis not present

## 2019-04-05 DIAGNOSIS — D631 Anemia in chronic kidney disease: Secondary | ICD-10-CM | POA: Diagnosis not present

## 2019-04-06 DIAGNOSIS — Z992 Dependence on renal dialysis: Secondary | ICD-10-CM | POA: Diagnosis not present

## 2019-04-06 DIAGNOSIS — N186 End stage renal disease: Secondary | ICD-10-CM | POA: Diagnosis not present

## 2019-04-06 DIAGNOSIS — D509 Iron deficiency anemia, unspecified: Secondary | ICD-10-CM | POA: Diagnosis not present

## 2019-04-06 DIAGNOSIS — D472 Monoclonal gammopathy: Secondary | ICD-10-CM | POA: Diagnosis not present

## 2019-04-06 DIAGNOSIS — N2581 Secondary hyperparathyroidism of renal origin: Secondary | ICD-10-CM | POA: Diagnosis not present

## 2019-04-06 DIAGNOSIS — D631 Anemia in chronic kidney disease: Secondary | ICD-10-CM | POA: Diagnosis not present

## 2019-04-06 DIAGNOSIS — Z23 Encounter for immunization: Secondary | ICD-10-CM | POA: Diagnosis not present

## 2019-04-07 DIAGNOSIS — D509 Iron deficiency anemia, unspecified: Secondary | ICD-10-CM | POA: Diagnosis not present

## 2019-04-07 DIAGNOSIS — N2581 Secondary hyperparathyroidism of renal origin: Secondary | ICD-10-CM | POA: Diagnosis not present

## 2019-04-07 DIAGNOSIS — Z992 Dependence on renal dialysis: Secondary | ICD-10-CM | POA: Diagnosis not present

## 2019-04-07 DIAGNOSIS — N186 End stage renal disease: Secondary | ICD-10-CM | POA: Diagnosis not present

## 2019-04-07 DIAGNOSIS — D472 Monoclonal gammopathy: Secondary | ICD-10-CM | POA: Diagnosis not present

## 2019-04-07 DIAGNOSIS — Z23 Encounter for immunization: Secondary | ICD-10-CM | POA: Diagnosis not present

## 2019-04-09 DIAGNOSIS — Z992 Dependence on renal dialysis: Secondary | ICD-10-CM | POA: Diagnosis not present

## 2019-04-09 DIAGNOSIS — N2581 Secondary hyperparathyroidism of renal origin: Secondary | ICD-10-CM | POA: Diagnosis not present

## 2019-04-09 DIAGNOSIS — D509 Iron deficiency anemia, unspecified: Secondary | ICD-10-CM | POA: Diagnosis not present

## 2019-04-09 DIAGNOSIS — Z23 Encounter for immunization: Secondary | ICD-10-CM | POA: Diagnosis not present

## 2019-04-09 DIAGNOSIS — D472 Monoclonal gammopathy: Secondary | ICD-10-CM | POA: Diagnosis not present

## 2019-04-09 DIAGNOSIS — N186 End stage renal disease: Secondary | ICD-10-CM | POA: Diagnosis not present

## 2019-04-12 DIAGNOSIS — Z23 Encounter for immunization: Secondary | ICD-10-CM | POA: Diagnosis not present

## 2019-04-12 DIAGNOSIS — N186 End stage renal disease: Secondary | ICD-10-CM | POA: Diagnosis not present

## 2019-04-12 DIAGNOSIS — N2581 Secondary hyperparathyroidism of renal origin: Secondary | ICD-10-CM | POA: Diagnosis not present

## 2019-04-12 DIAGNOSIS — Z992 Dependence on renal dialysis: Secondary | ICD-10-CM | POA: Diagnosis not present

## 2019-04-12 DIAGNOSIS — D509 Iron deficiency anemia, unspecified: Secondary | ICD-10-CM | POA: Diagnosis not present

## 2019-04-12 DIAGNOSIS — D472 Monoclonal gammopathy: Secondary | ICD-10-CM | POA: Diagnosis not present

## 2019-04-14 DIAGNOSIS — N186 End stage renal disease: Secondary | ICD-10-CM | POA: Diagnosis not present

## 2019-04-14 DIAGNOSIS — Z23 Encounter for immunization: Secondary | ICD-10-CM | POA: Diagnosis not present

## 2019-04-14 DIAGNOSIS — N2581 Secondary hyperparathyroidism of renal origin: Secondary | ICD-10-CM | POA: Diagnosis not present

## 2019-04-14 DIAGNOSIS — D472 Monoclonal gammopathy: Secondary | ICD-10-CM | POA: Diagnosis not present

## 2019-04-14 DIAGNOSIS — D509 Iron deficiency anemia, unspecified: Secondary | ICD-10-CM | POA: Diagnosis not present

## 2019-04-14 DIAGNOSIS — Z992 Dependence on renal dialysis: Secondary | ICD-10-CM | POA: Diagnosis not present

## 2019-04-16 DIAGNOSIS — D472 Monoclonal gammopathy: Secondary | ICD-10-CM | POA: Diagnosis not present

## 2019-04-16 DIAGNOSIS — Z23 Encounter for immunization: Secondary | ICD-10-CM | POA: Diagnosis not present

## 2019-04-16 DIAGNOSIS — N186 End stage renal disease: Secondary | ICD-10-CM | POA: Diagnosis not present

## 2019-04-16 DIAGNOSIS — D509 Iron deficiency anemia, unspecified: Secondary | ICD-10-CM | POA: Diagnosis not present

## 2019-04-16 DIAGNOSIS — N2581 Secondary hyperparathyroidism of renal origin: Secondary | ICD-10-CM | POA: Diagnosis not present

## 2019-04-16 DIAGNOSIS — Z992 Dependence on renal dialysis: Secondary | ICD-10-CM | POA: Diagnosis not present

## 2019-04-17 DIAGNOSIS — N2581 Secondary hyperparathyroidism of renal origin: Secondary | ICD-10-CM | POA: Diagnosis not present

## 2019-04-17 DIAGNOSIS — D472 Monoclonal gammopathy: Secondary | ICD-10-CM | POA: Diagnosis not present

## 2019-04-17 DIAGNOSIS — N186 End stage renal disease: Secondary | ICD-10-CM | POA: Diagnosis not present

## 2019-04-17 DIAGNOSIS — Z992 Dependence on renal dialysis: Secondary | ICD-10-CM | POA: Diagnosis not present

## 2019-04-17 DIAGNOSIS — Z23 Encounter for immunization: Secondary | ICD-10-CM | POA: Diagnosis not present

## 2019-04-17 DIAGNOSIS — D509 Iron deficiency anemia, unspecified: Secondary | ICD-10-CM | POA: Diagnosis not present

## 2019-04-19 DIAGNOSIS — D509 Iron deficiency anemia, unspecified: Secondary | ICD-10-CM | POA: Diagnosis not present

## 2019-04-19 DIAGNOSIS — Z992 Dependence on renal dialysis: Secondary | ICD-10-CM | POA: Diagnosis not present

## 2019-04-19 DIAGNOSIS — Z23 Encounter for immunization: Secondary | ICD-10-CM | POA: Diagnosis not present

## 2019-04-19 DIAGNOSIS — N186 End stage renal disease: Secondary | ICD-10-CM | POA: Diagnosis not present

## 2019-04-19 DIAGNOSIS — D472 Monoclonal gammopathy: Secondary | ICD-10-CM | POA: Diagnosis not present

## 2019-04-19 DIAGNOSIS — N2581 Secondary hyperparathyroidism of renal origin: Secondary | ICD-10-CM | POA: Diagnosis not present

## 2019-04-21 DIAGNOSIS — Z23 Encounter for immunization: Secondary | ICD-10-CM | POA: Diagnosis not present

## 2019-04-21 DIAGNOSIS — D509 Iron deficiency anemia, unspecified: Secondary | ICD-10-CM | POA: Diagnosis not present

## 2019-04-21 DIAGNOSIS — N2581 Secondary hyperparathyroidism of renal origin: Secondary | ICD-10-CM | POA: Diagnosis not present

## 2019-04-21 DIAGNOSIS — Z992 Dependence on renal dialysis: Secondary | ICD-10-CM | POA: Diagnosis not present

## 2019-04-21 DIAGNOSIS — N186 End stage renal disease: Secondary | ICD-10-CM | POA: Diagnosis not present

## 2019-04-21 DIAGNOSIS — D472 Monoclonal gammopathy: Secondary | ICD-10-CM | POA: Diagnosis not present

## 2019-04-23 DIAGNOSIS — Z992 Dependence on renal dialysis: Secondary | ICD-10-CM | POA: Diagnosis not present

## 2019-04-23 DIAGNOSIS — N2581 Secondary hyperparathyroidism of renal origin: Secondary | ICD-10-CM | POA: Diagnosis not present

## 2019-04-23 DIAGNOSIS — Z23 Encounter for immunization: Secondary | ICD-10-CM | POA: Diagnosis not present

## 2019-04-23 DIAGNOSIS — D472 Monoclonal gammopathy: Secondary | ICD-10-CM | POA: Diagnosis not present

## 2019-04-23 DIAGNOSIS — D509 Iron deficiency anemia, unspecified: Secondary | ICD-10-CM | POA: Diagnosis not present

## 2019-04-23 DIAGNOSIS — N186 End stage renal disease: Secondary | ICD-10-CM | POA: Diagnosis not present

## 2019-04-26 DIAGNOSIS — N186 End stage renal disease: Secondary | ICD-10-CM | POA: Diagnosis not present

## 2019-04-26 DIAGNOSIS — Z992 Dependence on renal dialysis: Secondary | ICD-10-CM | POA: Diagnosis not present

## 2019-04-26 DIAGNOSIS — D472 Monoclonal gammopathy: Secondary | ICD-10-CM | POA: Diagnosis not present

## 2019-04-26 DIAGNOSIS — Z23 Encounter for immunization: Secondary | ICD-10-CM | POA: Diagnosis not present

## 2019-04-26 DIAGNOSIS — N2581 Secondary hyperparathyroidism of renal origin: Secondary | ICD-10-CM | POA: Diagnosis not present

## 2019-04-26 DIAGNOSIS — D509 Iron deficiency anemia, unspecified: Secondary | ICD-10-CM | POA: Diagnosis not present

## 2019-04-28 DIAGNOSIS — Z992 Dependence on renal dialysis: Secondary | ICD-10-CM | POA: Diagnosis not present

## 2019-04-28 DIAGNOSIS — D509 Iron deficiency anemia, unspecified: Secondary | ICD-10-CM | POA: Diagnosis not present

## 2019-04-28 DIAGNOSIS — N186 End stage renal disease: Secondary | ICD-10-CM | POA: Diagnosis not present

## 2019-04-28 DIAGNOSIS — D472 Monoclonal gammopathy: Secondary | ICD-10-CM | POA: Diagnosis not present

## 2019-04-28 DIAGNOSIS — N2581 Secondary hyperparathyroidism of renal origin: Secondary | ICD-10-CM | POA: Diagnosis not present

## 2019-04-28 DIAGNOSIS — Z23 Encounter for immunization: Secondary | ICD-10-CM | POA: Diagnosis not present

## 2019-04-30 DIAGNOSIS — Z23 Encounter for immunization: Secondary | ICD-10-CM | POA: Diagnosis not present

## 2019-04-30 DIAGNOSIS — D509 Iron deficiency anemia, unspecified: Secondary | ICD-10-CM | POA: Diagnosis not present

## 2019-04-30 DIAGNOSIS — N2581 Secondary hyperparathyroidism of renal origin: Secondary | ICD-10-CM | POA: Diagnosis not present

## 2019-04-30 DIAGNOSIS — N186 End stage renal disease: Secondary | ICD-10-CM | POA: Diagnosis not present

## 2019-04-30 DIAGNOSIS — D472 Monoclonal gammopathy: Secondary | ICD-10-CM | POA: Diagnosis not present

## 2019-04-30 DIAGNOSIS — Z992 Dependence on renal dialysis: Secondary | ICD-10-CM | POA: Diagnosis not present

## 2019-05-03 DIAGNOSIS — Z23 Encounter for immunization: Secondary | ICD-10-CM | POA: Diagnosis not present

## 2019-05-03 DIAGNOSIS — N186 End stage renal disease: Secondary | ICD-10-CM | POA: Diagnosis not present

## 2019-05-03 DIAGNOSIS — N2581 Secondary hyperparathyroidism of renal origin: Secondary | ICD-10-CM | POA: Diagnosis not present

## 2019-05-03 DIAGNOSIS — D472 Monoclonal gammopathy: Secondary | ICD-10-CM | POA: Diagnosis not present

## 2019-05-03 DIAGNOSIS — Z992 Dependence on renal dialysis: Secondary | ICD-10-CM | POA: Diagnosis not present

## 2019-05-03 DIAGNOSIS — D509 Iron deficiency anemia, unspecified: Secondary | ICD-10-CM | POA: Diagnosis not present

## 2019-05-05 DIAGNOSIS — Z992 Dependence on renal dialysis: Secondary | ICD-10-CM | POA: Diagnosis not present

## 2019-05-05 DIAGNOSIS — N186 End stage renal disease: Secondary | ICD-10-CM | POA: Diagnosis not present

## 2019-05-06 DIAGNOSIS — Z992 Dependence on renal dialysis: Secondary | ICD-10-CM | POA: Diagnosis not present

## 2019-05-06 DIAGNOSIS — N186 End stage renal disease: Secondary | ICD-10-CM | POA: Diagnosis not present

## 2019-05-06 DIAGNOSIS — D631 Anemia in chronic kidney disease: Secondary | ICD-10-CM | POA: Diagnosis not present

## 2019-05-06 DIAGNOSIS — D472 Monoclonal gammopathy: Secondary | ICD-10-CM | POA: Diagnosis not present

## 2019-05-06 DIAGNOSIS — D509 Iron deficiency anemia, unspecified: Secondary | ICD-10-CM | POA: Diagnosis not present

## 2019-05-06 DIAGNOSIS — N2581 Secondary hyperparathyroidism of renal origin: Secondary | ICD-10-CM | POA: Diagnosis not present

## 2019-05-07 DIAGNOSIS — D631 Anemia in chronic kidney disease: Secondary | ICD-10-CM | POA: Diagnosis not present

## 2019-05-07 DIAGNOSIS — N186 End stage renal disease: Secondary | ICD-10-CM | POA: Diagnosis not present

## 2019-05-07 DIAGNOSIS — D472 Monoclonal gammopathy: Secondary | ICD-10-CM | POA: Diagnosis not present

## 2019-05-07 DIAGNOSIS — N2581 Secondary hyperparathyroidism of renal origin: Secondary | ICD-10-CM | POA: Diagnosis not present

## 2019-05-07 DIAGNOSIS — D509 Iron deficiency anemia, unspecified: Secondary | ICD-10-CM | POA: Diagnosis not present

## 2019-05-07 DIAGNOSIS — Z992 Dependence on renal dialysis: Secondary | ICD-10-CM | POA: Diagnosis not present

## 2019-05-10 DIAGNOSIS — D509 Iron deficiency anemia, unspecified: Secondary | ICD-10-CM | POA: Diagnosis not present

## 2019-05-10 DIAGNOSIS — N2581 Secondary hyperparathyroidism of renal origin: Secondary | ICD-10-CM | POA: Diagnosis not present

## 2019-05-10 DIAGNOSIS — D472 Monoclonal gammopathy: Secondary | ICD-10-CM | POA: Diagnosis not present

## 2019-05-10 DIAGNOSIS — D631 Anemia in chronic kidney disease: Secondary | ICD-10-CM | POA: Diagnosis not present

## 2019-05-10 DIAGNOSIS — N186 End stage renal disease: Secondary | ICD-10-CM | POA: Diagnosis not present

## 2019-05-10 DIAGNOSIS — Z992 Dependence on renal dialysis: Secondary | ICD-10-CM | POA: Diagnosis not present

## 2019-05-12 DIAGNOSIS — N186 End stage renal disease: Secondary | ICD-10-CM | POA: Diagnosis not present

## 2019-05-12 DIAGNOSIS — N2581 Secondary hyperparathyroidism of renal origin: Secondary | ICD-10-CM | POA: Diagnosis not present

## 2019-05-12 DIAGNOSIS — Z992 Dependence on renal dialysis: Secondary | ICD-10-CM | POA: Diagnosis not present

## 2019-05-12 DIAGNOSIS — D472 Monoclonal gammopathy: Secondary | ICD-10-CM | POA: Diagnosis not present

## 2019-05-12 DIAGNOSIS — D631 Anemia in chronic kidney disease: Secondary | ICD-10-CM | POA: Diagnosis not present

## 2019-05-12 DIAGNOSIS — D509 Iron deficiency anemia, unspecified: Secondary | ICD-10-CM | POA: Diagnosis not present

## 2019-05-14 DIAGNOSIS — Z992 Dependence on renal dialysis: Secondary | ICD-10-CM | POA: Diagnosis not present

## 2019-05-14 DIAGNOSIS — D472 Monoclonal gammopathy: Secondary | ICD-10-CM | POA: Diagnosis not present

## 2019-05-14 DIAGNOSIS — N2581 Secondary hyperparathyroidism of renal origin: Secondary | ICD-10-CM | POA: Diagnosis not present

## 2019-05-14 DIAGNOSIS — D509 Iron deficiency anemia, unspecified: Secondary | ICD-10-CM | POA: Diagnosis not present

## 2019-05-14 DIAGNOSIS — D631 Anemia in chronic kidney disease: Secondary | ICD-10-CM | POA: Diagnosis not present

## 2019-05-14 DIAGNOSIS — N186 End stage renal disease: Secondary | ICD-10-CM | POA: Diagnosis not present

## 2019-05-17 DIAGNOSIS — N2581 Secondary hyperparathyroidism of renal origin: Secondary | ICD-10-CM | POA: Diagnosis not present

## 2019-05-17 DIAGNOSIS — Z992 Dependence on renal dialysis: Secondary | ICD-10-CM | POA: Diagnosis not present

## 2019-05-17 DIAGNOSIS — N186 End stage renal disease: Secondary | ICD-10-CM | POA: Diagnosis not present

## 2019-05-17 DIAGNOSIS — D631 Anemia in chronic kidney disease: Secondary | ICD-10-CM | POA: Diagnosis not present

## 2019-05-17 DIAGNOSIS — D509 Iron deficiency anemia, unspecified: Secondary | ICD-10-CM | POA: Diagnosis not present

## 2019-05-17 DIAGNOSIS — D472 Monoclonal gammopathy: Secondary | ICD-10-CM | POA: Diagnosis not present

## 2019-05-21 DIAGNOSIS — D472 Monoclonal gammopathy: Secondary | ICD-10-CM | POA: Diagnosis not present

## 2019-05-21 DIAGNOSIS — D509 Iron deficiency anemia, unspecified: Secondary | ICD-10-CM | POA: Diagnosis not present

## 2019-05-21 DIAGNOSIS — D631 Anemia in chronic kidney disease: Secondary | ICD-10-CM | POA: Diagnosis not present

## 2019-05-21 DIAGNOSIS — Z992 Dependence on renal dialysis: Secondary | ICD-10-CM | POA: Diagnosis not present

## 2019-05-21 DIAGNOSIS — N186 End stage renal disease: Secondary | ICD-10-CM | POA: Diagnosis not present

## 2019-05-21 DIAGNOSIS — N2581 Secondary hyperparathyroidism of renal origin: Secondary | ICD-10-CM | POA: Diagnosis not present

## 2019-05-24 DIAGNOSIS — D631 Anemia in chronic kidney disease: Secondary | ICD-10-CM | POA: Diagnosis not present

## 2019-05-24 DIAGNOSIS — D472 Monoclonal gammopathy: Secondary | ICD-10-CM | POA: Diagnosis not present

## 2019-05-24 DIAGNOSIS — D509 Iron deficiency anemia, unspecified: Secondary | ICD-10-CM | POA: Diagnosis not present

## 2019-05-24 DIAGNOSIS — N2581 Secondary hyperparathyroidism of renal origin: Secondary | ICD-10-CM | POA: Diagnosis not present

## 2019-05-24 DIAGNOSIS — N186 End stage renal disease: Secondary | ICD-10-CM | POA: Diagnosis not present

## 2019-05-24 DIAGNOSIS — Z992 Dependence on renal dialysis: Secondary | ICD-10-CM | POA: Diagnosis not present

## 2019-05-26 DIAGNOSIS — Z992 Dependence on renal dialysis: Secondary | ICD-10-CM | POA: Diagnosis not present

## 2019-05-26 DIAGNOSIS — N186 End stage renal disease: Secondary | ICD-10-CM | POA: Diagnosis not present

## 2019-05-26 DIAGNOSIS — D509 Iron deficiency anemia, unspecified: Secondary | ICD-10-CM | POA: Diagnosis not present

## 2019-05-26 DIAGNOSIS — D631 Anemia in chronic kidney disease: Secondary | ICD-10-CM | POA: Diagnosis not present

## 2019-05-26 DIAGNOSIS — D472 Monoclonal gammopathy: Secondary | ICD-10-CM | POA: Diagnosis not present

## 2019-05-26 DIAGNOSIS — N2581 Secondary hyperparathyroidism of renal origin: Secondary | ICD-10-CM | POA: Diagnosis not present

## 2019-05-28 DIAGNOSIS — D631 Anemia in chronic kidney disease: Secondary | ICD-10-CM | POA: Diagnosis not present

## 2019-05-28 DIAGNOSIS — N186 End stage renal disease: Secondary | ICD-10-CM | POA: Diagnosis not present

## 2019-05-28 DIAGNOSIS — N2581 Secondary hyperparathyroidism of renal origin: Secondary | ICD-10-CM | POA: Diagnosis not present

## 2019-05-28 DIAGNOSIS — D472 Monoclonal gammopathy: Secondary | ICD-10-CM | POA: Diagnosis not present

## 2019-05-28 DIAGNOSIS — D509 Iron deficiency anemia, unspecified: Secondary | ICD-10-CM | POA: Diagnosis not present

## 2019-05-28 DIAGNOSIS — Z992 Dependence on renal dialysis: Secondary | ICD-10-CM | POA: Diagnosis not present

## 2019-05-31 DIAGNOSIS — D509 Iron deficiency anemia, unspecified: Secondary | ICD-10-CM | POA: Diagnosis not present

## 2019-05-31 DIAGNOSIS — D472 Monoclonal gammopathy: Secondary | ICD-10-CM | POA: Diagnosis not present

## 2019-05-31 DIAGNOSIS — N186 End stage renal disease: Secondary | ICD-10-CM | POA: Diagnosis not present

## 2019-05-31 DIAGNOSIS — D631 Anemia in chronic kidney disease: Secondary | ICD-10-CM | POA: Diagnosis not present

## 2019-05-31 DIAGNOSIS — N2581 Secondary hyperparathyroidism of renal origin: Secondary | ICD-10-CM | POA: Diagnosis not present

## 2019-05-31 DIAGNOSIS — Z992 Dependence on renal dialysis: Secondary | ICD-10-CM | POA: Diagnosis not present

## 2019-06-02 DIAGNOSIS — D509 Iron deficiency anemia, unspecified: Secondary | ICD-10-CM | POA: Diagnosis not present

## 2019-06-02 DIAGNOSIS — D631 Anemia in chronic kidney disease: Secondary | ICD-10-CM | POA: Diagnosis not present

## 2019-06-02 DIAGNOSIS — Z992 Dependence on renal dialysis: Secondary | ICD-10-CM | POA: Diagnosis not present

## 2019-06-02 DIAGNOSIS — N2581 Secondary hyperparathyroidism of renal origin: Secondary | ICD-10-CM | POA: Diagnosis not present

## 2019-06-02 DIAGNOSIS — D472 Monoclonal gammopathy: Secondary | ICD-10-CM | POA: Diagnosis not present

## 2019-06-02 DIAGNOSIS — N186 End stage renal disease: Secondary | ICD-10-CM | POA: Diagnosis not present

## 2019-06-04 DIAGNOSIS — N2581 Secondary hyperparathyroidism of renal origin: Secondary | ICD-10-CM | POA: Diagnosis not present

## 2019-06-04 DIAGNOSIS — D472 Monoclonal gammopathy: Secondary | ICD-10-CM | POA: Diagnosis not present

## 2019-06-04 DIAGNOSIS — D509 Iron deficiency anemia, unspecified: Secondary | ICD-10-CM | POA: Diagnosis not present

## 2019-06-04 DIAGNOSIS — N186 End stage renal disease: Secondary | ICD-10-CM | POA: Diagnosis not present

## 2019-06-04 DIAGNOSIS — D631 Anemia in chronic kidney disease: Secondary | ICD-10-CM | POA: Diagnosis not present

## 2019-06-04 DIAGNOSIS — Z992 Dependence on renal dialysis: Secondary | ICD-10-CM | POA: Diagnosis not present

## 2019-06-05 DIAGNOSIS — Z992 Dependence on renal dialysis: Secondary | ICD-10-CM | POA: Diagnosis not present

## 2019-06-05 DIAGNOSIS — N186 End stage renal disease: Secondary | ICD-10-CM | POA: Diagnosis not present

## 2019-06-06 DIAGNOSIS — D472 Monoclonal gammopathy: Secondary | ICD-10-CM | POA: Diagnosis not present

## 2019-06-06 DIAGNOSIS — N2581 Secondary hyperparathyroidism of renal origin: Secondary | ICD-10-CM | POA: Diagnosis not present

## 2019-06-06 DIAGNOSIS — N186 End stage renal disease: Secondary | ICD-10-CM | POA: Diagnosis not present

## 2019-06-06 DIAGNOSIS — Z992 Dependence on renal dialysis: Secondary | ICD-10-CM | POA: Diagnosis not present

## 2019-06-06 DIAGNOSIS — D631 Anemia in chronic kidney disease: Secondary | ICD-10-CM | POA: Diagnosis not present

## 2019-06-06 DIAGNOSIS — D509 Iron deficiency anemia, unspecified: Secondary | ICD-10-CM | POA: Diagnosis not present

## 2019-06-07 DIAGNOSIS — D509 Iron deficiency anemia, unspecified: Secondary | ICD-10-CM | POA: Diagnosis not present

## 2019-06-07 DIAGNOSIS — N2581 Secondary hyperparathyroidism of renal origin: Secondary | ICD-10-CM | POA: Diagnosis not present

## 2019-06-07 DIAGNOSIS — Z992 Dependence on renal dialysis: Secondary | ICD-10-CM | POA: Diagnosis not present

## 2019-06-07 DIAGNOSIS — N186 End stage renal disease: Secondary | ICD-10-CM | POA: Diagnosis not present

## 2019-06-07 DIAGNOSIS — D472 Monoclonal gammopathy: Secondary | ICD-10-CM | POA: Diagnosis not present

## 2019-06-07 DIAGNOSIS — D631 Anemia in chronic kidney disease: Secondary | ICD-10-CM | POA: Diagnosis not present

## 2019-06-09 DIAGNOSIS — D631 Anemia in chronic kidney disease: Secondary | ICD-10-CM | POA: Diagnosis not present

## 2019-06-09 DIAGNOSIS — Z992 Dependence on renal dialysis: Secondary | ICD-10-CM | POA: Diagnosis not present

## 2019-06-09 DIAGNOSIS — D472 Monoclonal gammopathy: Secondary | ICD-10-CM | POA: Diagnosis not present

## 2019-06-09 DIAGNOSIS — N2581 Secondary hyperparathyroidism of renal origin: Secondary | ICD-10-CM | POA: Diagnosis not present

## 2019-06-09 DIAGNOSIS — D509 Iron deficiency anemia, unspecified: Secondary | ICD-10-CM | POA: Diagnosis not present

## 2019-06-09 DIAGNOSIS — N186 End stage renal disease: Secondary | ICD-10-CM | POA: Diagnosis not present

## 2019-06-11 DIAGNOSIS — N2581 Secondary hyperparathyroidism of renal origin: Secondary | ICD-10-CM | POA: Diagnosis not present

## 2019-06-11 DIAGNOSIS — D509 Iron deficiency anemia, unspecified: Secondary | ICD-10-CM | POA: Diagnosis not present

## 2019-06-11 DIAGNOSIS — D472 Monoclonal gammopathy: Secondary | ICD-10-CM | POA: Diagnosis not present

## 2019-06-11 DIAGNOSIS — N186 End stage renal disease: Secondary | ICD-10-CM | POA: Diagnosis not present

## 2019-06-11 DIAGNOSIS — D631 Anemia in chronic kidney disease: Secondary | ICD-10-CM | POA: Diagnosis not present

## 2019-06-11 DIAGNOSIS — Z992 Dependence on renal dialysis: Secondary | ICD-10-CM | POA: Diagnosis not present

## 2019-06-14 DIAGNOSIS — N2581 Secondary hyperparathyroidism of renal origin: Secondary | ICD-10-CM | POA: Diagnosis not present

## 2019-06-14 DIAGNOSIS — D472 Monoclonal gammopathy: Secondary | ICD-10-CM | POA: Diagnosis not present

## 2019-06-14 DIAGNOSIS — N186 End stage renal disease: Secondary | ICD-10-CM | POA: Diagnosis not present

## 2019-06-14 DIAGNOSIS — Z992 Dependence on renal dialysis: Secondary | ICD-10-CM | POA: Diagnosis not present

## 2019-06-14 DIAGNOSIS — D631 Anemia in chronic kidney disease: Secondary | ICD-10-CM | POA: Diagnosis not present

## 2019-06-14 DIAGNOSIS — D509 Iron deficiency anemia, unspecified: Secondary | ICD-10-CM | POA: Diagnosis not present

## 2019-06-16 DIAGNOSIS — Z992 Dependence on renal dialysis: Secondary | ICD-10-CM | POA: Diagnosis not present

## 2019-06-16 DIAGNOSIS — N2581 Secondary hyperparathyroidism of renal origin: Secondary | ICD-10-CM | POA: Diagnosis not present

## 2019-06-16 DIAGNOSIS — D472 Monoclonal gammopathy: Secondary | ICD-10-CM | POA: Diagnosis not present

## 2019-06-16 DIAGNOSIS — N186 End stage renal disease: Secondary | ICD-10-CM | POA: Diagnosis not present

## 2019-06-16 DIAGNOSIS — D509 Iron deficiency anemia, unspecified: Secondary | ICD-10-CM | POA: Diagnosis not present

## 2019-06-16 DIAGNOSIS — D631 Anemia in chronic kidney disease: Secondary | ICD-10-CM | POA: Diagnosis not present

## 2019-06-18 DIAGNOSIS — Z992 Dependence on renal dialysis: Secondary | ICD-10-CM | POA: Diagnosis not present

## 2019-06-18 DIAGNOSIS — D509 Iron deficiency anemia, unspecified: Secondary | ICD-10-CM | POA: Diagnosis not present

## 2019-06-18 DIAGNOSIS — N186 End stage renal disease: Secondary | ICD-10-CM | POA: Diagnosis not present

## 2019-06-18 DIAGNOSIS — D472 Monoclonal gammopathy: Secondary | ICD-10-CM | POA: Diagnosis not present

## 2019-06-18 DIAGNOSIS — D631 Anemia in chronic kidney disease: Secondary | ICD-10-CM | POA: Diagnosis not present

## 2019-06-18 DIAGNOSIS — N2581 Secondary hyperparathyroidism of renal origin: Secondary | ICD-10-CM | POA: Diagnosis not present

## 2019-06-21 DIAGNOSIS — Z992 Dependence on renal dialysis: Secondary | ICD-10-CM | POA: Diagnosis not present

## 2019-06-21 DIAGNOSIS — N2581 Secondary hyperparathyroidism of renal origin: Secondary | ICD-10-CM | POA: Diagnosis not present

## 2019-06-21 DIAGNOSIS — N186 End stage renal disease: Secondary | ICD-10-CM | POA: Diagnosis not present

## 2019-06-21 DIAGNOSIS — D509 Iron deficiency anemia, unspecified: Secondary | ICD-10-CM | POA: Diagnosis not present

## 2019-06-21 DIAGNOSIS — D472 Monoclonal gammopathy: Secondary | ICD-10-CM | POA: Diagnosis not present

## 2019-06-21 DIAGNOSIS — D631 Anemia in chronic kidney disease: Secondary | ICD-10-CM | POA: Diagnosis not present

## 2019-06-23 DIAGNOSIS — N2581 Secondary hyperparathyroidism of renal origin: Secondary | ICD-10-CM | POA: Diagnosis not present

## 2019-06-23 DIAGNOSIS — D472 Monoclonal gammopathy: Secondary | ICD-10-CM | POA: Diagnosis not present

## 2019-06-23 DIAGNOSIS — N186 End stage renal disease: Secondary | ICD-10-CM | POA: Diagnosis not present

## 2019-06-23 DIAGNOSIS — Z992 Dependence on renal dialysis: Secondary | ICD-10-CM | POA: Diagnosis not present

## 2019-06-23 DIAGNOSIS — D631 Anemia in chronic kidney disease: Secondary | ICD-10-CM | POA: Diagnosis not present

## 2019-06-23 DIAGNOSIS — D509 Iron deficiency anemia, unspecified: Secondary | ICD-10-CM | POA: Diagnosis not present

## 2019-06-25 DIAGNOSIS — D509 Iron deficiency anemia, unspecified: Secondary | ICD-10-CM | POA: Diagnosis not present

## 2019-06-25 DIAGNOSIS — N2581 Secondary hyperparathyroidism of renal origin: Secondary | ICD-10-CM | POA: Diagnosis not present

## 2019-06-25 DIAGNOSIS — D631 Anemia in chronic kidney disease: Secondary | ICD-10-CM | POA: Diagnosis not present

## 2019-06-25 DIAGNOSIS — D472 Monoclonal gammopathy: Secondary | ICD-10-CM | POA: Diagnosis not present

## 2019-06-25 DIAGNOSIS — Z992 Dependence on renal dialysis: Secondary | ICD-10-CM | POA: Diagnosis not present

## 2019-06-25 DIAGNOSIS — N186 End stage renal disease: Secondary | ICD-10-CM | POA: Diagnosis not present

## 2019-06-28 DIAGNOSIS — D509 Iron deficiency anemia, unspecified: Secondary | ICD-10-CM | POA: Diagnosis not present

## 2019-06-28 DIAGNOSIS — Z992 Dependence on renal dialysis: Secondary | ICD-10-CM | POA: Diagnosis not present

## 2019-06-28 DIAGNOSIS — D472 Monoclonal gammopathy: Secondary | ICD-10-CM | POA: Diagnosis not present

## 2019-06-28 DIAGNOSIS — N186 End stage renal disease: Secondary | ICD-10-CM | POA: Diagnosis not present

## 2019-06-28 DIAGNOSIS — D631 Anemia in chronic kidney disease: Secondary | ICD-10-CM | POA: Diagnosis not present

## 2019-06-28 DIAGNOSIS — N2581 Secondary hyperparathyroidism of renal origin: Secondary | ICD-10-CM | POA: Diagnosis not present

## 2019-06-30 DIAGNOSIS — N2581 Secondary hyperparathyroidism of renal origin: Secondary | ICD-10-CM | POA: Diagnosis not present

## 2019-06-30 DIAGNOSIS — N186 End stage renal disease: Secondary | ICD-10-CM | POA: Diagnosis not present

## 2019-06-30 DIAGNOSIS — Z992 Dependence on renal dialysis: Secondary | ICD-10-CM | POA: Diagnosis not present

## 2019-06-30 DIAGNOSIS — D472 Monoclonal gammopathy: Secondary | ICD-10-CM | POA: Diagnosis not present

## 2019-06-30 DIAGNOSIS — D509 Iron deficiency anemia, unspecified: Secondary | ICD-10-CM | POA: Diagnosis not present

## 2019-06-30 DIAGNOSIS — D631 Anemia in chronic kidney disease: Secondary | ICD-10-CM | POA: Diagnosis not present

## 2019-07-02 DIAGNOSIS — Z992 Dependence on renal dialysis: Secondary | ICD-10-CM | POA: Diagnosis not present

## 2019-07-02 DIAGNOSIS — D472 Monoclonal gammopathy: Secondary | ICD-10-CM | POA: Diagnosis not present

## 2019-07-02 DIAGNOSIS — D509 Iron deficiency anemia, unspecified: Secondary | ICD-10-CM | POA: Diagnosis not present

## 2019-07-02 DIAGNOSIS — D631 Anemia in chronic kidney disease: Secondary | ICD-10-CM | POA: Diagnosis not present

## 2019-07-02 DIAGNOSIS — N186 End stage renal disease: Secondary | ICD-10-CM | POA: Diagnosis not present

## 2019-07-02 DIAGNOSIS — N2581 Secondary hyperparathyroidism of renal origin: Secondary | ICD-10-CM | POA: Diagnosis not present

## 2019-07-05 DIAGNOSIS — D472 Monoclonal gammopathy: Secondary | ICD-10-CM | POA: Diagnosis not present

## 2019-07-05 DIAGNOSIS — D509 Iron deficiency anemia, unspecified: Secondary | ICD-10-CM | POA: Diagnosis not present

## 2019-07-05 DIAGNOSIS — Z992 Dependence on renal dialysis: Secondary | ICD-10-CM | POA: Diagnosis not present

## 2019-07-05 DIAGNOSIS — N186 End stage renal disease: Secondary | ICD-10-CM | POA: Diagnosis not present

## 2019-07-05 DIAGNOSIS — D631 Anemia in chronic kidney disease: Secondary | ICD-10-CM | POA: Diagnosis not present

## 2019-07-05 DIAGNOSIS — N2581 Secondary hyperparathyroidism of renal origin: Secondary | ICD-10-CM | POA: Diagnosis not present

## 2019-07-06 DIAGNOSIS — N186 End stage renal disease: Secondary | ICD-10-CM | POA: Diagnosis not present

## 2019-07-06 DIAGNOSIS — N2581 Secondary hyperparathyroidism of renal origin: Secondary | ICD-10-CM | POA: Diagnosis not present

## 2019-07-06 DIAGNOSIS — Z992 Dependence on renal dialysis: Secondary | ICD-10-CM | POA: Diagnosis not present

## 2019-07-06 DIAGNOSIS — D509 Iron deficiency anemia, unspecified: Secondary | ICD-10-CM | POA: Diagnosis not present

## 2019-07-06 DIAGNOSIS — D472 Monoclonal gammopathy: Secondary | ICD-10-CM | POA: Diagnosis not present

## 2019-07-06 DIAGNOSIS — D631 Anemia in chronic kidney disease: Secondary | ICD-10-CM | POA: Diagnosis not present

## 2019-07-07 DIAGNOSIS — N2581 Secondary hyperparathyroidism of renal origin: Secondary | ICD-10-CM | POA: Diagnosis not present

## 2019-07-07 DIAGNOSIS — D631 Anemia in chronic kidney disease: Secondary | ICD-10-CM | POA: Diagnosis not present

## 2019-07-07 DIAGNOSIS — Z992 Dependence on renal dialysis: Secondary | ICD-10-CM | POA: Diagnosis not present

## 2019-07-07 DIAGNOSIS — N186 End stage renal disease: Secondary | ICD-10-CM | POA: Diagnosis not present

## 2019-07-07 DIAGNOSIS — D472 Monoclonal gammopathy: Secondary | ICD-10-CM | POA: Diagnosis not present

## 2019-07-07 DIAGNOSIS — D509 Iron deficiency anemia, unspecified: Secondary | ICD-10-CM | POA: Diagnosis not present

## 2019-07-09 DIAGNOSIS — D631 Anemia in chronic kidney disease: Secondary | ICD-10-CM | POA: Diagnosis not present

## 2019-07-09 DIAGNOSIS — D472 Monoclonal gammopathy: Secondary | ICD-10-CM | POA: Diagnosis not present

## 2019-07-09 DIAGNOSIS — N186 End stage renal disease: Secondary | ICD-10-CM | POA: Diagnosis not present

## 2019-07-09 DIAGNOSIS — N2581 Secondary hyperparathyroidism of renal origin: Secondary | ICD-10-CM | POA: Diagnosis not present

## 2019-07-09 DIAGNOSIS — Z992 Dependence on renal dialysis: Secondary | ICD-10-CM | POA: Diagnosis not present

## 2019-07-09 DIAGNOSIS — D509 Iron deficiency anemia, unspecified: Secondary | ICD-10-CM | POA: Diagnosis not present

## 2019-07-12 DIAGNOSIS — N2581 Secondary hyperparathyroidism of renal origin: Secondary | ICD-10-CM | POA: Diagnosis not present

## 2019-07-12 DIAGNOSIS — D631 Anemia in chronic kidney disease: Secondary | ICD-10-CM | POA: Diagnosis not present

## 2019-07-12 DIAGNOSIS — D509 Iron deficiency anemia, unspecified: Secondary | ICD-10-CM | POA: Diagnosis not present

## 2019-07-12 DIAGNOSIS — D472 Monoclonal gammopathy: Secondary | ICD-10-CM | POA: Diagnosis not present

## 2019-07-12 DIAGNOSIS — Z992 Dependence on renal dialysis: Secondary | ICD-10-CM | POA: Diagnosis not present

## 2019-07-12 DIAGNOSIS — N186 End stage renal disease: Secondary | ICD-10-CM | POA: Diagnosis not present

## 2019-07-14 DIAGNOSIS — D509 Iron deficiency anemia, unspecified: Secondary | ICD-10-CM | POA: Diagnosis not present

## 2019-07-14 DIAGNOSIS — Z992 Dependence on renal dialysis: Secondary | ICD-10-CM | POA: Diagnosis not present

## 2019-07-14 DIAGNOSIS — D631 Anemia in chronic kidney disease: Secondary | ICD-10-CM | POA: Diagnosis not present

## 2019-07-14 DIAGNOSIS — D472 Monoclonal gammopathy: Secondary | ICD-10-CM | POA: Diagnosis not present

## 2019-07-14 DIAGNOSIS — N2581 Secondary hyperparathyroidism of renal origin: Secondary | ICD-10-CM | POA: Diagnosis not present

## 2019-07-14 DIAGNOSIS — N186 End stage renal disease: Secondary | ICD-10-CM | POA: Diagnosis not present

## 2019-07-15 DIAGNOSIS — H401330 Pigmentary glaucoma, bilateral, stage unspecified: Secondary | ICD-10-CM | POA: Diagnosis not present

## 2019-07-16 DIAGNOSIS — D472 Monoclonal gammopathy: Secondary | ICD-10-CM | POA: Diagnosis not present

## 2019-07-16 DIAGNOSIS — D631 Anemia in chronic kidney disease: Secondary | ICD-10-CM | POA: Diagnosis not present

## 2019-07-16 DIAGNOSIS — N186 End stage renal disease: Secondary | ICD-10-CM | POA: Diagnosis not present

## 2019-07-16 DIAGNOSIS — D509 Iron deficiency anemia, unspecified: Secondary | ICD-10-CM | POA: Diagnosis not present

## 2019-07-16 DIAGNOSIS — Z992 Dependence on renal dialysis: Secondary | ICD-10-CM | POA: Diagnosis not present

## 2019-07-16 DIAGNOSIS — N2581 Secondary hyperparathyroidism of renal origin: Secondary | ICD-10-CM | POA: Diagnosis not present

## 2019-07-19 DIAGNOSIS — N186 End stage renal disease: Secondary | ICD-10-CM | POA: Diagnosis not present

## 2019-07-19 DIAGNOSIS — D631 Anemia in chronic kidney disease: Secondary | ICD-10-CM | POA: Diagnosis not present

## 2019-07-19 DIAGNOSIS — D509 Iron deficiency anemia, unspecified: Secondary | ICD-10-CM | POA: Diagnosis not present

## 2019-07-19 DIAGNOSIS — Z992 Dependence on renal dialysis: Secondary | ICD-10-CM | POA: Diagnosis not present

## 2019-07-19 DIAGNOSIS — N2581 Secondary hyperparathyroidism of renal origin: Secondary | ICD-10-CM | POA: Diagnosis not present

## 2019-07-19 DIAGNOSIS — D472 Monoclonal gammopathy: Secondary | ICD-10-CM | POA: Diagnosis not present

## 2019-07-21 DIAGNOSIS — N186 End stage renal disease: Secondary | ICD-10-CM | POA: Diagnosis not present

## 2019-07-21 DIAGNOSIS — D472 Monoclonal gammopathy: Secondary | ICD-10-CM | POA: Diagnosis not present

## 2019-07-21 DIAGNOSIS — N2581 Secondary hyperparathyroidism of renal origin: Secondary | ICD-10-CM | POA: Diagnosis not present

## 2019-07-21 DIAGNOSIS — D631 Anemia in chronic kidney disease: Secondary | ICD-10-CM | POA: Diagnosis not present

## 2019-07-21 DIAGNOSIS — Z992 Dependence on renal dialysis: Secondary | ICD-10-CM | POA: Diagnosis not present

## 2019-07-21 DIAGNOSIS — D509 Iron deficiency anemia, unspecified: Secondary | ICD-10-CM | POA: Diagnosis not present

## 2019-07-23 DIAGNOSIS — N2581 Secondary hyperparathyroidism of renal origin: Secondary | ICD-10-CM | POA: Diagnosis not present

## 2019-07-23 DIAGNOSIS — D472 Monoclonal gammopathy: Secondary | ICD-10-CM | POA: Diagnosis not present

## 2019-07-23 DIAGNOSIS — Z992 Dependence on renal dialysis: Secondary | ICD-10-CM | POA: Diagnosis not present

## 2019-07-23 DIAGNOSIS — D631 Anemia in chronic kidney disease: Secondary | ICD-10-CM | POA: Diagnosis not present

## 2019-07-23 DIAGNOSIS — N186 End stage renal disease: Secondary | ICD-10-CM | POA: Diagnosis not present

## 2019-07-23 DIAGNOSIS — D509 Iron deficiency anemia, unspecified: Secondary | ICD-10-CM | POA: Diagnosis not present

## 2019-07-26 DIAGNOSIS — N2581 Secondary hyperparathyroidism of renal origin: Secondary | ICD-10-CM | POA: Diagnosis not present

## 2019-07-26 DIAGNOSIS — D631 Anemia in chronic kidney disease: Secondary | ICD-10-CM | POA: Diagnosis not present

## 2019-07-26 DIAGNOSIS — Z992 Dependence on renal dialysis: Secondary | ICD-10-CM | POA: Diagnosis not present

## 2019-07-26 DIAGNOSIS — N186 End stage renal disease: Secondary | ICD-10-CM | POA: Diagnosis not present

## 2019-07-26 DIAGNOSIS — D509 Iron deficiency anemia, unspecified: Secondary | ICD-10-CM | POA: Diagnosis not present

## 2019-07-26 DIAGNOSIS — D472 Monoclonal gammopathy: Secondary | ICD-10-CM | POA: Diagnosis not present

## 2019-07-28 DIAGNOSIS — N186 End stage renal disease: Secondary | ICD-10-CM | POA: Diagnosis not present

## 2019-07-28 DIAGNOSIS — Z992 Dependence on renal dialysis: Secondary | ICD-10-CM | POA: Diagnosis not present

## 2019-07-28 DIAGNOSIS — D472 Monoclonal gammopathy: Secondary | ICD-10-CM | POA: Diagnosis not present

## 2019-07-28 DIAGNOSIS — D631 Anemia in chronic kidney disease: Secondary | ICD-10-CM | POA: Diagnosis not present

## 2019-07-28 DIAGNOSIS — N2581 Secondary hyperparathyroidism of renal origin: Secondary | ICD-10-CM | POA: Diagnosis not present

## 2019-07-28 DIAGNOSIS — D509 Iron deficiency anemia, unspecified: Secondary | ICD-10-CM | POA: Diagnosis not present

## 2019-07-31 DIAGNOSIS — N186 End stage renal disease: Secondary | ICD-10-CM | POA: Diagnosis not present

## 2019-07-31 DIAGNOSIS — D509 Iron deficiency anemia, unspecified: Secondary | ICD-10-CM | POA: Diagnosis not present

## 2019-07-31 DIAGNOSIS — Z992 Dependence on renal dialysis: Secondary | ICD-10-CM | POA: Diagnosis not present

## 2019-07-31 DIAGNOSIS — N2581 Secondary hyperparathyroidism of renal origin: Secondary | ICD-10-CM | POA: Diagnosis not present

## 2019-07-31 DIAGNOSIS — D631 Anemia in chronic kidney disease: Secondary | ICD-10-CM | POA: Diagnosis not present

## 2019-07-31 DIAGNOSIS — D472 Monoclonal gammopathy: Secondary | ICD-10-CM | POA: Diagnosis not present

## 2019-08-02 DIAGNOSIS — N186 End stage renal disease: Secondary | ICD-10-CM | POA: Diagnosis not present

## 2019-08-02 DIAGNOSIS — N2581 Secondary hyperparathyroidism of renal origin: Secondary | ICD-10-CM | POA: Diagnosis not present

## 2019-08-02 DIAGNOSIS — Z992 Dependence on renal dialysis: Secondary | ICD-10-CM | POA: Diagnosis not present

## 2019-08-02 DIAGNOSIS — D472 Monoclonal gammopathy: Secondary | ICD-10-CM | POA: Diagnosis not present

## 2019-08-02 DIAGNOSIS — D509 Iron deficiency anemia, unspecified: Secondary | ICD-10-CM | POA: Diagnosis not present

## 2019-08-02 DIAGNOSIS — D631 Anemia in chronic kidney disease: Secondary | ICD-10-CM | POA: Diagnosis not present

## 2019-08-04 DIAGNOSIS — N2581 Secondary hyperparathyroidism of renal origin: Secondary | ICD-10-CM | POA: Diagnosis not present

## 2019-08-04 DIAGNOSIS — D509 Iron deficiency anemia, unspecified: Secondary | ICD-10-CM | POA: Diagnosis not present

## 2019-08-04 DIAGNOSIS — D472 Monoclonal gammopathy: Secondary | ICD-10-CM | POA: Diagnosis not present

## 2019-08-04 DIAGNOSIS — D631 Anemia in chronic kidney disease: Secondary | ICD-10-CM | POA: Diagnosis not present

## 2019-08-04 DIAGNOSIS — Z992 Dependence on renal dialysis: Secondary | ICD-10-CM | POA: Diagnosis not present

## 2019-08-04 DIAGNOSIS — N186 End stage renal disease: Secondary | ICD-10-CM | POA: Diagnosis not present

## 2019-08-06 DIAGNOSIS — N2581 Secondary hyperparathyroidism of renal origin: Secondary | ICD-10-CM | POA: Diagnosis not present

## 2019-08-06 DIAGNOSIS — Z992 Dependence on renal dialysis: Secondary | ICD-10-CM | POA: Diagnosis not present

## 2019-08-06 DIAGNOSIS — D631 Anemia in chronic kidney disease: Secondary | ICD-10-CM | POA: Diagnosis not present

## 2019-08-06 DIAGNOSIS — N186 End stage renal disease: Secondary | ICD-10-CM | POA: Diagnosis not present

## 2019-08-06 DIAGNOSIS — D472 Monoclonal gammopathy: Secondary | ICD-10-CM | POA: Diagnosis not present

## 2019-08-06 DIAGNOSIS — D509 Iron deficiency anemia, unspecified: Secondary | ICD-10-CM | POA: Diagnosis not present

## 2019-08-09 DIAGNOSIS — N2581 Secondary hyperparathyroidism of renal origin: Secondary | ICD-10-CM | POA: Diagnosis not present

## 2019-08-09 DIAGNOSIS — D472 Monoclonal gammopathy: Secondary | ICD-10-CM | POA: Diagnosis not present

## 2019-08-09 DIAGNOSIS — Z992 Dependence on renal dialysis: Secondary | ICD-10-CM | POA: Diagnosis not present

## 2019-08-09 DIAGNOSIS — N186 End stage renal disease: Secondary | ICD-10-CM | POA: Diagnosis not present

## 2019-08-09 DIAGNOSIS — D631 Anemia in chronic kidney disease: Secondary | ICD-10-CM | POA: Diagnosis not present

## 2019-08-09 DIAGNOSIS — D509 Iron deficiency anemia, unspecified: Secondary | ICD-10-CM | POA: Diagnosis not present

## 2019-08-11 DIAGNOSIS — D631 Anemia in chronic kidney disease: Secondary | ICD-10-CM | POA: Diagnosis not present

## 2019-08-11 DIAGNOSIS — Z992 Dependence on renal dialysis: Secondary | ICD-10-CM | POA: Diagnosis not present

## 2019-08-11 DIAGNOSIS — D509 Iron deficiency anemia, unspecified: Secondary | ICD-10-CM | POA: Diagnosis not present

## 2019-08-11 DIAGNOSIS — N2581 Secondary hyperparathyroidism of renal origin: Secondary | ICD-10-CM | POA: Diagnosis not present

## 2019-08-11 DIAGNOSIS — D472 Monoclonal gammopathy: Secondary | ICD-10-CM | POA: Diagnosis not present

## 2019-08-11 DIAGNOSIS — N186 End stage renal disease: Secondary | ICD-10-CM | POA: Diagnosis not present

## 2019-08-13 ENCOUNTER — Telehealth: Payer: Medicare Other | Admitting: Cardiovascular Disease

## 2019-08-13 DIAGNOSIS — D631 Anemia in chronic kidney disease: Secondary | ICD-10-CM | POA: Diagnosis not present

## 2019-08-13 DIAGNOSIS — N186 End stage renal disease: Secondary | ICD-10-CM | POA: Diagnosis not present

## 2019-08-13 DIAGNOSIS — D472 Monoclonal gammopathy: Secondary | ICD-10-CM | POA: Diagnosis not present

## 2019-08-13 DIAGNOSIS — Z992 Dependence on renal dialysis: Secondary | ICD-10-CM | POA: Diagnosis not present

## 2019-08-13 DIAGNOSIS — N2581 Secondary hyperparathyroidism of renal origin: Secondary | ICD-10-CM | POA: Diagnosis not present

## 2019-08-13 DIAGNOSIS — D509 Iron deficiency anemia, unspecified: Secondary | ICD-10-CM | POA: Diagnosis not present

## 2019-08-15 DIAGNOSIS — D472 Monoclonal gammopathy: Secondary | ICD-10-CM | POA: Diagnosis not present

## 2019-08-15 DIAGNOSIS — Z992 Dependence on renal dialysis: Secondary | ICD-10-CM | POA: Diagnosis not present

## 2019-08-15 DIAGNOSIS — D631 Anemia in chronic kidney disease: Secondary | ICD-10-CM | POA: Diagnosis not present

## 2019-08-15 DIAGNOSIS — N2581 Secondary hyperparathyroidism of renal origin: Secondary | ICD-10-CM | POA: Diagnosis not present

## 2019-08-15 DIAGNOSIS — D509 Iron deficiency anemia, unspecified: Secondary | ICD-10-CM | POA: Diagnosis not present

## 2019-08-15 DIAGNOSIS — N186 End stage renal disease: Secondary | ICD-10-CM | POA: Diagnosis not present

## 2019-08-16 DIAGNOSIS — Z992 Dependence on renal dialysis: Secondary | ICD-10-CM | POA: Diagnosis not present

## 2019-08-16 DIAGNOSIS — N186 End stage renal disease: Secondary | ICD-10-CM | POA: Diagnosis not present

## 2019-08-16 DIAGNOSIS — D472 Monoclonal gammopathy: Secondary | ICD-10-CM | POA: Diagnosis not present

## 2019-08-16 DIAGNOSIS — D509 Iron deficiency anemia, unspecified: Secondary | ICD-10-CM | POA: Diagnosis not present

## 2019-08-16 DIAGNOSIS — D631 Anemia in chronic kidney disease: Secondary | ICD-10-CM | POA: Diagnosis not present

## 2019-08-16 DIAGNOSIS — N2581 Secondary hyperparathyroidism of renal origin: Secondary | ICD-10-CM | POA: Diagnosis not present

## 2019-08-18 DIAGNOSIS — D472 Monoclonal gammopathy: Secondary | ICD-10-CM | POA: Diagnosis not present

## 2019-08-18 DIAGNOSIS — Z992 Dependence on renal dialysis: Secondary | ICD-10-CM | POA: Diagnosis not present

## 2019-08-18 DIAGNOSIS — N2581 Secondary hyperparathyroidism of renal origin: Secondary | ICD-10-CM | POA: Diagnosis not present

## 2019-08-18 DIAGNOSIS — D631 Anemia in chronic kidney disease: Secondary | ICD-10-CM | POA: Diagnosis not present

## 2019-08-18 DIAGNOSIS — D509 Iron deficiency anemia, unspecified: Secondary | ICD-10-CM | POA: Diagnosis not present

## 2019-08-18 DIAGNOSIS — N186 End stage renal disease: Secondary | ICD-10-CM | POA: Diagnosis not present

## 2019-08-20 DIAGNOSIS — N2581 Secondary hyperparathyroidism of renal origin: Secondary | ICD-10-CM | POA: Diagnosis not present

## 2019-08-20 DIAGNOSIS — N186 End stage renal disease: Secondary | ICD-10-CM | POA: Diagnosis not present

## 2019-08-20 DIAGNOSIS — D472 Monoclonal gammopathy: Secondary | ICD-10-CM | POA: Diagnosis not present

## 2019-08-20 DIAGNOSIS — D509 Iron deficiency anemia, unspecified: Secondary | ICD-10-CM | POA: Diagnosis not present

## 2019-08-20 DIAGNOSIS — D631 Anemia in chronic kidney disease: Secondary | ICD-10-CM | POA: Diagnosis not present

## 2019-08-20 DIAGNOSIS — Z992 Dependence on renal dialysis: Secondary | ICD-10-CM | POA: Diagnosis not present

## 2019-08-23 DIAGNOSIS — D509 Iron deficiency anemia, unspecified: Secondary | ICD-10-CM | POA: Diagnosis not present

## 2019-08-23 DIAGNOSIS — N186 End stage renal disease: Secondary | ICD-10-CM | POA: Diagnosis not present

## 2019-08-23 DIAGNOSIS — D631 Anemia in chronic kidney disease: Secondary | ICD-10-CM | POA: Diagnosis not present

## 2019-08-23 DIAGNOSIS — N2581 Secondary hyperparathyroidism of renal origin: Secondary | ICD-10-CM | POA: Diagnosis not present

## 2019-08-23 DIAGNOSIS — Z992 Dependence on renal dialysis: Secondary | ICD-10-CM | POA: Diagnosis not present

## 2019-08-23 DIAGNOSIS — D472 Monoclonal gammopathy: Secondary | ICD-10-CM | POA: Diagnosis not present

## 2019-08-25 DIAGNOSIS — N2581 Secondary hyperparathyroidism of renal origin: Secondary | ICD-10-CM | POA: Diagnosis not present

## 2019-08-25 DIAGNOSIS — D631 Anemia in chronic kidney disease: Secondary | ICD-10-CM | POA: Diagnosis not present

## 2019-08-25 DIAGNOSIS — Z992 Dependence on renal dialysis: Secondary | ICD-10-CM | POA: Diagnosis not present

## 2019-08-25 DIAGNOSIS — D472 Monoclonal gammopathy: Secondary | ICD-10-CM | POA: Diagnosis not present

## 2019-08-25 DIAGNOSIS — N186 End stage renal disease: Secondary | ICD-10-CM | POA: Diagnosis not present

## 2019-08-25 DIAGNOSIS — D509 Iron deficiency anemia, unspecified: Secondary | ICD-10-CM | POA: Diagnosis not present

## 2019-08-27 DIAGNOSIS — D472 Monoclonal gammopathy: Secondary | ICD-10-CM | POA: Diagnosis not present

## 2019-08-27 DIAGNOSIS — D509 Iron deficiency anemia, unspecified: Secondary | ICD-10-CM | POA: Diagnosis not present

## 2019-08-27 DIAGNOSIS — N186 End stage renal disease: Secondary | ICD-10-CM | POA: Diagnosis not present

## 2019-08-27 DIAGNOSIS — D631 Anemia in chronic kidney disease: Secondary | ICD-10-CM | POA: Diagnosis not present

## 2019-08-27 DIAGNOSIS — Z992 Dependence on renal dialysis: Secondary | ICD-10-CM | POA: Diagnosis not present

## 2019-08-27 DIAGNOSIS — N2581 Secondary hyperparathyroidism of renal origin: Secondary | ICD-10-CM | POA: Diagnosis not present

## 2019-08-30 DIAGNOSIS — N186 End stage renal disease: Secondary | ICD-10-CM | POA: Diagnosis not present

## 2019-08-30 DIAGNOSIS — Z992 Dependence on renal dialysis: Secondary | ICD-10-CM | POA: Diagnosis not present

## 2019-08-30 DIAGNOSIS — D472 Monoclonal gammopathy: Secondary | ICD-10-CM | POA: Diagnosis not present

## 2019-08-30 DIAGNOSIS — D509 Iron deficiency anemia, unspecified: Secondary | ICD-10-CM | POA: Diagnosis not present

## 2019-08-30 DIAGNOSIS — N2581 Secondary hyperparathyroidism of renal origin: Secondary | ICD-10-CM | POA: Diagnosis not present

## 2019-08-30 DIAGNOSIS — D631 Anemia in chronic kidney disease: Secondary | ICD-10-CM | POA: Diagnosis not present

## 2019-09-01 DIAGNOSIS — D509 Iron deficiency anemia, unspecified: Secondary | ICD-10-CM | POA: Diagnosis not present

## 2019-09-01 DIAGNOSIS — D472 Monoclonal gammopathy: Secondary | ICD-10-CM | POA: Diagnosis not present

## 2019-09-01 DIAGNOSIS — Z992 Dependence on renal dialysis: Secondary | ICD-10-CM | POA: Diagnosis not present

## 2019-09-01 DIAGNOSIS — D631 Anemia in chronic kidney disease: Secondary | ICD-10-CM | POA: Diagnosis not present

## 2019-09-01 DIAGNOSIS — N186 End stage renal disease: Secondary | ICD-10-CM | POA: Diagnosis not present

## 2019-09-01 DIAGNOSIS — N2581 Secondary hyperparathyroidism of renal origin: Secondary | ICD-10-CM | POA: Diagnosis not present

## 2019-09-03 DIAGNOSIS — N2581 Secondary hyperparathyroidism of renal origin: Secondary | ICD-10-CM | POA: Diagnosis not present

## 2019-09-03 DIAGNOSIS — D509 Iron deficiency anemia, unspecified: Secondary | ICD-10-CM | POA: Diagnosis not present

## 2019-09-03 DIAGNOSIS — Z992 Dependence on renal dialysis: Secondary | ICD-10-CM | POA: Diagnosis not present

## 2019-09-03 DIAGNOSIS — D631 Anemia in chronic kidney disease: Secondary | ICD-10-CM | POA: Diagnosis not present

## 2019-09-03 DIAGNOSIS — D472 Monoclonal gammopathy: Secondary | ICD-10-CM | POA: Diagnosis not present

## 2019-09-03 DIAGNOSIS — N186 End stage renal disease: Secondary | ICD-10-CM | POA: Diagnosis not present

## 2019-09-05 DIAGNOSIS — Z992 Dependence on renal dialysis: Secondary | ICD-10-CM | POA: Diagnosis not present

## 2019-09-05 DIAGNOSIS — N186 End stage renal disease: Secondary | ICD-10-CM | POA: Diagnosis not present

## 2019-09-06 DIAGNOSIS — D472 Monoclonal gammopathy: Secondary | ICD-10-CM | POA: Diagnosis not present

## 2019-09-06 DIAGNOSIS — N2581 Secondary hyperparathyroidism of renal origin: Secondary | ICD-10-CM | POA: Diagnosis not present

## 2019-09-06 DIAGNOSIS — N186 End stage renal disease: Secondary | ICD-10-CM | POA: Diagnosis not present

## 2019-09-06 DIAGNOSIS — Z992 Dependence on renal dialysis: Secondary | ICD-10-CM | POA: Diagnosis not present

## 2019-09-06 DIAGNOSIS — D631 Anemia in chronic kidney disease: Secondary | ICD-10-CM | POA: Diagnosis not present

## 2019-09-08 DIAGNOSIS — N2581 Secondary hyperparathyroidism of renal origin: Secondary | ICD-10-CM | POA: Diagnosis not present

## 2019-09-08 DIAGNOSIS — D472 Monoclonal gammopathy: Secondary | ICD-10-CM | POA: Diagnosis not present

## 2019-09-08 DIAGNOSIS — Z992 Dependence on renal dialysis: Secondary | ICD-10-CM | POA: Diagnosis not present

## 2019-09-08 DIAGNOSIS — N186 End stage renal disease: Secondary | ICD-10-CM | POA: Diagnosis not present

## 2019-09-08 DIAGNOSIS — D631 Anemia in chronic kidney disease: Secondary | ICD-10-CM | POA: Diagnosis not present

## 2019-09-10 DIAGNOSIS — D472 Monoclonal gammopathy: Secondary | ICD-10-CM | POA: Diagnosis not present

## 2019-09-10 DIAGNOSIS — N2581 Secondary hyperparathyroidism of renal origin: Secondary | ICD-10-CM | POA: Diagnosis not present

## 2019-09-10 DIAGNOSIS — N186 End stage renal disease: Secondary | ICD-10-CM | POA: Diagnosis not present

## 2019-09-10 DIAGNOSIS — Z992 Dependence on renal dialysis: Secondary | ICD-10-CM | POA: Diagnosis not present

## 2019-09-10 DIAGNOSIS — D631 Anemia in chronic kidney disease: Secondary | ICD-10-CM | POA: Diagnosis not present

## 2019-09-13 DIAGNOSIS — N2581 Secondary hyperparathyroidism of renal origin: Secondary | ICD-10-CM | POA: Diagnosis not present

## 2019-09-13 DIAGNOSIS — D472 Monoclonal gammopathy: Secondary | ICD-10-CM | POA: Diagnosis not present

## 2019-09-13 DIAGNOSIS — N186 End stage renal disease: Secondary | ICD-10-CM | POA: Diagnosis not present

## 2019-09-13 DIAGNOSIS — D631 Anemia in chronic kidney disease: Secondary | ICD-10-CM | POA: Diagnosis not present

## 2019-09-13 DIAGNOSIS — Z992 Dependence on renal dialysis: Secondary | ICD-10-CM | POA: Diagnosis not present

## 2019-09-14 DIAGNOSIS — D631 Anemia in chronic kidney disease: Secondary | ICD-10-CM | POA: Diagnosis not present

## 2019-09-14 DIAGNOSIS — D472 Monoclonal gammopathy: Secondary | ICD-10-CM | POA: Diagnosis not present

## 2019-09-14 DIAGNOSIS — N2581 Secondary hyperparathyroidism of renal origin: Secondary | ICD-10-CM | POA: Diagnosis not present

## 2019-09-14 DIAGNOSIS — Z992 Dependence on renal dialysis: Secondary | ICD-10-CM | POA: Diagnosis not present

## 2019-09-14 DIAGNOSIS — N186 End stage renal disease: Secondary | ICD-10-CM | POA: Diagnosis not present

## 2019-09-15 DIAGNOSIS — D631 Anemia in chronic kidney disease: Secondary | ICD-10-CM | POA: Diagnosis not present

## 2019-09-15 DIAGNOSIS — Z992 Dependence on renal dialysis: Secondary | ICD-10-CM | POA: Diagnosis not present

## 2019-09-15 DIAGNOSIS — N2581 Secondary hyperparathyroidism of renal origin: Secondary | ICD-10-CM | POA: Diagnosis not present

## 2019-09-15 DIAGNOSIS — D472 Monoclonal gammopathy: Secondary | ICD-10-CM | POA: Diagnosis not present

## 2019-09-15 DIAGNOSIS — N186 End stage renal disease: Secondary | ICD-10-CM | POA: Diagnosis not present

## 2019-09-20 DIAGNOSIS — N2581 Secondary hyperparathyroidism of renal origin: Secondary | ICD-10-CM | POA: Diagnosis not present

## 2019-09-20 DIAGNOSIS — D472 Monoclonal gammopathy: Secondary | ICD-10-CM | POA: Diagnosis not present

## 2019-09-20 DIAGNOSIS — Z992 Dependence on renal dialysis: Secondary | ICD-10-CM | POA: Diagnosis not present

## 2019-09-20 DIAGNOSIS — D631 Anemia in chronic kidney disease: Secondary | ICD-10-CM | POA: Diagnosis not present

## 2019-09-20 DIAGNOSIS — N186 End stage renal disease: Secondary | ICD-10-CM | POA: Diagnosis not present

## 2019-09-22 DIAGNOSIS — D631 Anemia in chronic kidney disease: Secondary | ICD-10-CM | POA: Diagnosis not present

## 2019-09-22 DIAGNOSIS — Z992 Dependence on renal dialysis: Secondary | ICD-10-CM | POA: Diagnosis not present

## 2019-09-22 DIAGNOSIS — N2581 Secondary hyperparathyroidism of renal origin: Secondary | ICD-10-CM | POA: Diagnosis not present

## 2019-09-22 DIAGNOSIS — D472 Monoclonal gammopathy: Secondary | ICD-10-CM | POA: Diagnosis not present

## 2019-09-22 DIAGNOSIS — N186 End stage renal disease: Secondary | ICD-10-CM | POA: Diagnosis not present

## 2019-09-24 DIAGNOSIS — D631 Anemia in chronic kidney disease: Secondary | ICD-10-CM | POA: Diagnosis not present

## 2019-09-24 DIAGNOSIS — N186 End stage renal disease: Secondary | ICD-10-CM | POA: Diagnosis not present

## 2019-09-24 DIAGNOSIS — N2581 Secondary hyperparathyroidism of renal origin: Secondary | ICD-10-CM | POA: Diagnosis not present

## 2019-09-24 DIAGNOSIS — D472 Monoclonal gammopathy: Secondary | ICD-10-CM | POA: Diagnosis not present

## 2019-09-24 DIAGNOSIS — Z992 Dependence on renal dialysis: Secondary | ICD-10-CM | POA: Diagnosis not present

## 2019-09-27 DIAGNOSIS — Z992 Dependence on renal dialysis: Secondary | ICD-10-CM | POA: Diagnosis not present

## 2019-09-27 DIAGNOSIS — D631 Anemia in chronic kidney disease: Secondary | ICD-10-CM | POA: Diagnosis not present

## 2019-09-27 DIAGNOSIS — D472 Monoclonal gammopathy: Secondary | ICD-10-CM | POA: Diagnosis not present

## 2019-09-27 DIAGNOSIS — N2581 Secondary hyperparathyroidism of renal origin: Secondary | ICD-10-CM | POA: Diagnosis not present

## 2019-09-27 DIAGNOSIS — N186 End stage renal disease: Secondary | ICD-10-CM | POA: Diagnosis not present

## 2019-09-29 DIAGNOSIS — Z992 Dependence on renal dialysis: Secondary | ICD-10-CM | POA: Diagnosis not present

## 2019-09-29 DIAGNOSIS — N186 End stage renal disease: Secondary | ICD-10-CM | POA: Diagnosis not present

## 2019-09-29 DIAGNOSIS — D631 Anemia in chronic kidney disease: Secondary | ICD-10-CM | POA: Diagnosis not present

## 2019-09-29 DIAGNOSIS — D472 Monoclonal gammopathy: Secondary | ICD-10-CM | POA: Diagnosis not present

## 2019-09-29 DIAGNOSIS — N2581 Secondary hyperparathyroidism of renal origin: Secondary | ICD-10-CM | POA: Diagnosis not present

## 2019-10-01 DIAGNOSIS — D631 Anemia in chronic kidney disease: Secondary | ICD-10-CM | POA: Diagnosis not present

## 2019-10-01 DIAGNOSIS — N2581 Secondary hyperparathyroidism of renal origin: Secondary | ICD-10-CM | POA: Diagnosis not present

## 2019-10-01 DIAGNOSIS — Z992 Dependence on renal dialysis: Secondary | ICD-10-CM | POA: Diagnosis not present

## 2019-10-01 DIAGNOSIS — N186 End stage renal disease: Secondary | ICD-10-CM | POA: Diagnosis not present

## 2019-10-01 DIAGNOSIS — D472 Monoclonal gammopathy: Secondary | ICD-10-CM | POA: Diagnosis not present

## 2019-10-03 DIAGNOSIS — N186 End stage renal disease: Secondary | ICD-10-CM | POA: Diagnosis not present

## 2019-10-03 DIAGNOSIS — Z992 Dependence on renal dialysis: Secondary | ICD-10-CM | POA: Diagnosis not present

## 2019-10-04 DIAGNOSIS — D472 Monoclonal gammopathy: Secondary | ICD-10-CM | POA: Diagnosis not present

## 2019-10-04 DIAGNOSIS — D631 Anemia in chronic kidney disease: Secondary | ICD-10-CM | POA: Diagnosis not present

## 2019-10-04 DIAGNOSIS — D509 Iron deficiency anemia, unspecified: Secondary | ICD-10-CM | POA: Diagnosis not present

## 2019-10-04 DIAGNOSIS — N186 End stage renal disease: Secondary | ICD-10-CM | POA: Diagnosis not present

## 2019-10-04 DIAGNOSIS — Z992 Dependence on renal dialysis: Secondary | ICD-10-CM | POA: Diagnosis not present

## 2019-10-04 DIAGNOSIS — N2581 Secondary hyperparathyroidism of renal origin: Secondary | ICD-10-CM | POA: Diagnosis not present

## 2019-10-06 DIAGNOSIS — N186 End stage renal disease: Secondary | ICD-10-CM | POA: Diagnosis not present

## 2019-10-06 DIAGNOSIS — D509 Iron deficiency anemia, unspecified: Secondary | ICD-10-CM | POA: Diagnosis not present

## 2019-10-06 DIAGNOSIS — N2581 Secondary hyperparathyroidism of renal origin: Secondary | ICD-10-CM | POA: Diagnosis not present

## 2019-10-06 DIAGNOSIS — Z992 Dependence on renal dialysis: Secondary | ICD-10-CM | POA: Diagnosis not present

## 2019-10-06 DIAGNOSIS — D631 Anemia in chronic kidney disease: Secondary | ICD-10-CM | POA: Diagnosis not present

## 2019-10-06 DIAGNOSIS — D472 Monoclonal gammopathy: Secondary | ICD-10-CM | POA: Diagnosis not present

## 2019-10-08 DIAGNOSIS — D472 Monoclonal gammopathy: Secondary | ICD-10-CM | POA: Diagnosis not present

## 2019-10-08 DIAGNOSIS — N2581 Secondary hyperparathyroidism of renal origin: Secondary | ICD-10-CM | POA: Diagnosis not present

## 2019-10-08 DIAGNOSIS — N186 End stage renal disease: Secondary | ICD-10-CM | POA: Diagnosis not present

## 2019-10-08 DIAGNOSIS — D509 Iron deficiency anemia, unspecified: Secondary | ICD-10-CM | POA: Diagnosis not present

## 2019-10-08 DIAGNOSIS — D631 Anemia in chronic kidney disease: Secondary | ICD-10-CM | POA: Diagnosis not present

## 2019-10-08 DIAGNOSIS — Z992 Dependence on renal dialysis: Secondary | ICD-10-CM | POA: Diagnosis not present

## 2019-10-11 DIAGNOSIS — D472 Monoclonal gammopathy: Secondary | ICD-10-CM | POA: Diagnosis not present

## 2019-10-11 DIAGNOSIS — Z23 Encounter for immunization: Secondary | ICD-10-CM | POA: Diagnosis not present

## 2019-10-11 DIAGNOSIS — Z992 Dependence on renal dialysis: Secondary | ICD-10-CM | POA: Diagnosis not present

## 2019-10-11 DIAGNOSIS — N186 End stage renal disease: Secondary | ICD-10-CM | POA: Diagnosis not present

## 2019-10-11 DIAGNOSIS — D509 Iron deficiency anemia, unspecified: Secondary | ICD-10-CM | POA: Diagnosis not present

## 2019-10-11 DIAGNOSIS — D631 Anemia in chronic kidney disease: Secondary | ICD-10-CM | POA: Diagnosis not present

## 2019-10-11 DIAGNOSIS — N2581 Secondary hyperparathyroidism of renal origin: Secondary | ICD-10-CM | POA: Diagnosis not present

## 2019-10-13 DIAGNOSIS — Z992 Dependence on renal dialysis: Secondary | ICD-10-CM | POA: Diagnosis not present

## 2019-10-13 DIAGNOSIS — D509 Iron deficiency anemia, unspecified: Secondary | ICD-10-CM | POA: Diagnosis not present

## 2019-10-13 DIAGNOSIS — D631 Anemia in chronic kidney disease: Secondary | ICD-10-CM | POA: Diagnosis not present

## 2019-10-13 DIAGNOSIS — D472 Monoclonal gammopathy: Secondary | ICD-10-CM | POA: Diagnosis not present

## 2019-10-13 DIAGNOSIS — N2581 Secondary hyperparathyroidism of renal origin: Secondary | ICD-10-CM | POA: Diagnosis not present

## 2019-10-13 DIAGNOSIS — N186 End stage renal disease: Secondary | ICD-10-CM | POA: Diagnosis not present

## 2019-10-14 DIAGNOSIS — D472 Monoclonal gammopathy: Secondary | ICD-10-CM | POA: Diagnosis not present

## 2019-10-14 DIAGNOSIS — D631 Anemia in chronic kidney disease: Secondary | ICD-10-CM | POA: Diagnosis not present

## 2019-10-14 DIAGNOSIS — N2581 Secondary hyperparathyroidism of renal origin: Secondary | ICD-10-CM | POA: Diagnosis not present

## 2019-10-14 DIAGNOSIS — N186 End stage renal disease: Secondary | ICD-10-CM | POA: Diagnosis not present

## 2019-10-14 DIAGNOSIS — D509 Iron deficiency anemia, unspecified: Secondary | ICD-10-CM | POA: Diagnosis not present

## 2019-10-14 DIAGNOSIS — Z992 Dependence on renal dialysis: Secondary | ICD-10-CM | POA: Diagnosis not present

## 2019-10-15 DIAGNOSIS — N2581 Secondary hyperparathyroidism of renal origin: Secondary | ICD-10-CM | POA: Diagnosis not present

## 2019-10-15 DIAGNOSIS — D631 Anemia in chronic kidney disease: Secondary | ICD-10-CM | POA: Diagnosis not present

## 2019-10-15 DIAGNOSIS — Z992 Dependence on renal dialysis: Secondary | ICD-10-CM | POA: Diagnosis not present

## 2019-10-15 DIAGNOSIS — N186 End stage renal disease: Secondary | ICD-10-CM | POA: Diagnosis not present

## 2019-10-15 DIAGNOSIS — D472 Monoclonal gammopathy: Secondary | ICD-10-CM | POA: Diagnosis not present

## 2019-10-15 DIAGNOSIS — D509 Iron deficiency anemia, unspecified: Secondary | ICD-10-CM | POA: Diagnosis not present

## 2019-10-18 DIAGNOSIS — D472 Monoclonal gammopathy: Secondary | ICD-10-CM | POA: Diagnosis not present

## 2019-10-18 DIAGNOSIS — D509 Iron deficiency anemia, unspecified: Secondary | ICD-10-CM | POA: Diagnosis not present

## 2019-10-18 DIAGNOSIS — N2581 Secondary hyperparathyroidism of renal origin: Secondary | ICD-10-CM | POA: Diagnosis not present

## 2019-10-18 DIAGNOSIS — Z992 Dependence on renal dialysis: Secondary | ICD-10-CM | POA: Diagnosis not present

## 2019-10-18 DIAGNOSIS — D631 Anemia in chronic kidney disease: Secondary | ICD-10-CM | POA: Diagnosis not present

## 2019-10-18 DIAGNOSIS — N186 End stage renal disease: Secondary | ICD-10-CM | POA: Diagnosis not present

## 2019-10-20 DIAGNOSIS — D631 Anemia in chronic kidney disease: Secondary | ICD-10-CM | POA: Diagnosis not present

## 2019-10-20 DIAGNOSIS — Z992 Dependence on renal dialysis: Secondary | ICD-10-CM | POA: Diagnosis not present

## 2019-10-20 DIAGNOSIS — D472 Monoclonal gammopathy: Secondary | ICD-10-CM | POA: Diagnosis not present

## 2019-10-20 DIAGNOSIS — N2581 Secondary hyperparathyroidism of renal origin: Secondary | ICD-10-CM | POA: Diagnosis not present

## 2019-10-20 DIAGNOSIS — N186 End stage renal disease: Secondary | ICD-10-CM | POA: Diagnosis not present

## 2019-10-20 DIAGNOSIS — D509 Iron deficiency anemia, unspecified: Secondary | ICD-10-CM | POA: Diagnosis not present

## 2019-10-22 DIAGNOSIS — D631 Anemia in chronic kidney disease: Secondary | ICD-10-CM | POA: Diagnosis not present

## 2019-10-22 DIAGNOSIS — D509 Iron deficiency anemia, unspecified: Secondary | ICD-10-CM | POA: Diagnosis not present

## 2019-10-22 DIAGNOSIS — N2581 Secondary hyperparathyroidism of renal origin: Secondary | ICD-10-CM | POA: Diagnosis not present

## 2019-10-22 DIAGNOSIS — D472 Monoclonal gammopathy: Secondary | ICD-10-CM | POA: Diagnosis not present

## 2019-10-22 DIAGNOSIS — N186 End stage renal disease: Secondary | ICD-10-CM | POA: Diagnosis not present

## 2019-10-22 DIAGNOSIS — Z992 Dependence on renal dialysis: Secondary | ICD-10-CM | POA: Diagnosis not present

## 2019-10-25 DIAGNOSIS — Z992 Dependence on renal dialysis: Secondary | ICD-10-CM | POA: Diagnosis not present

## 2019-10-25 DIAGNOSIS — E785 Hyperlipidemia, unspecified: Secondary | ICD-10-CM | POA: Diagnosis not present

## 2019-10-25 DIAGNOSIS — Z79899 Other long term (current) drug therapy: Secondary | ICD-10-CM | POA: Diagnosis not present

## 2019-10-25 DIAGNOSIS — D631 Anemia in chronic kidney disease: Secondary | ICD-10-CM | POA: Diagnosis not present

## 2019-10-25 DIAGNOSIS — D472 Monoclonal gammopathy: Secondary | ICD-10-CM | POA: Diagnosis not present

## 2019-10-25 DIAGNOSIS — D509 Iron deficiency anemia, unspecified: Secondary | ICD-10-CM | POA: Diagnosis not present

## 2019-10-25 DIAGNOSIS — Z5181 Encounter for therapeutic drug level monitoring: Secondary | ICD-10-CM | POA: Diagnosis not present

## 2019-10-25 DIAGNOSIS — N186 End stage renal disease: Secondary | ICD-10-CM | POA: Diagnosis not present

## 2019-10-25 DIAGNOSIS — N2581 Secondary hyperparathyroidism of renal origin: Secondary | ICD-10-CM | POA: Diagnosis not present

## 2019-10-27 DIAGNOSIS — N186 End stage renal disease: Secondary | ICD-10-CM | POA: Diagnosis not present

## 2019-10-27 DIAGNOSIS — D472 Monoclonal gammopathy: Secondary | ICD-10-CM | POA: Diagnosis not present

## 2019-10-27 DIAGNOSIS — Z992 Dependence on renal dialysis: Secondary | ICD-10-CM | POA: Diagnosis not present

## 2019-10-27 DIAGNOSIS — D631 Anemia in chronic kidney disease: Secondary | ICD-10-CM | POA: Diagnosis not present

## 2019-10-27 DIAGNOSIS — N2581 Secondary hyperparathyroidism of renal origin: Secondary | ICD-10-CM | POA: Diagnosis not present

## 2019-10-27 DIAGNOSIS — D509 Iron deficiency anemia, unspecified: Secondary | ICD-10-CM | POA: Diagnosis not present

## 2019-11-01 DIAGNOSIS — D631 Anemia in chronic kidney disease: Secondary | ICD-10-CM | POA: Diagnosis not present

## 2019-11-01 DIAGNOSIS — N186 End stage renal disease: Secondary | ICD-10-CM | POA: Diagnosis not present

## 2019-11-01 DIAGNOSIS — D509 Iron deficiency anemia, unspecified: Secondary | ICD-10-CM | POA: Diagnosis not present

## 2019-11-01 DIAGNOSIS — Z992 Dependence on renal dialysis: Secondary | ICD-10-CM | POA: Diagnosis not present

## 2019-11-01 DIAGNOSIS — N2581 Secondary hyperparathyroidism of renal origin: Secondary | ICD-10-CM | POA: Diagnosis not present

## 2019-11-01 DIAGNOSIS — D472 Monoclonal gammopathy: Secondary | ICD-10-CM | POA: Diagnosis not present

## 2019-11-03 DIAGNOSIS — N186 End stage renal disease: Secondary | ICD-10-CM | POA: Diagnosis not present

## 2019-11-03 DIAGNOSIS — D472 Monoclonal gammopathy: Secondary | ICD-10-CM | POA: Diagnosis not present

## 2019-11-03 DIAGNOSIS — D631 Anemia in chronic kidney disease: Secondary | ICD-10-CM | POA: Diagnosis not present

## 2019-11-03 DIAGNOSIS — N2581 Secondary hyperparathyroidism of renal origin: Secondary | ICD-10-CM | POA: Diagnosis not present

## 2019-11-03 DIAGNOSIS — D509 Iron deficiency anemia, unspecified: Secondary | ICD-10-CM | POA: Diagnosis not present

## 2019-11-03 DIAGNOSIS — Z992 Dependence on renal dialysis: Secondary | ICD-10-CM | POA: Diagnosis not present

## 2019-11-04 DIAGNOSIS — N2581 Secondary hyperparathyroidism of renal origin: Secondary | ICD-10-CM | POA: Diagnosis not present

## 2019-11-04 DIAGNOSIS — Z992 Dependence on renal dialysis: Secondary | ICD-10-CM | POA: Diagnosis not present

## 2019-11-04 DIAGNOSIS — D631 Anemia in chronic kidney disease: Secondary | ICD-10-CM | POA: Diagnosis not present

## 2019-11-04 DIAGNOSIS — N186 End stage renal disease: Secondary | ICD-10-CM | POA: Diagnosis not present

## 2019-11-04 DIAGNOSIS — D509 Iron deficiency anemia, unspecified: Secondary | ICD-10-CM | POA: Diagnosis not present

## 2019-11-04 DIAGNOSIS — D472 Monoclonal gammopathy: Secondary | ICD-10-CM | POA: Diagnosis not present

## 2019-11-05 DIAGNOSIS — D509 Iron deficiency anemia, unspecified: Secondary | ICD-10-CM | POA: Diagnosis not present

## 2019-11-05 DIAGNOSIS — N2581 Secondary hyperparathyroidism of renal origin: Secondary | ICD-10-CM | POA: Diagnosis not present

## 2019-11-05 DIAGNOSIS — Z992 Dependence on renal dialysis: Secondary | ICD-10-CM | POA: Diagnosis not present

## 2019-11-05 DIAGNOSIS — D631 Anemia in chronic kidney disease: Secondary | ICD-10-CM | POA: Diagnosis not present

## 2019-11-05 DIAGNOSIS — N186 End stage renal disease: Secondary | ICD-10-CM | POA: Diagnosis not present

## 2019-11-05 DIAGNOSIS — D472 Monoclonal gammopathy: Secondary | ICD-10-CM | POA: Diagnosis not present

## 2019-11-08 DIAGNOSIS — Z992 Dependence on renal dialysis: Secondary | ICD-10-CM | POA: Diagnosis not present

## 2019-11-08 DIAGNOSIS — N186 End stage renal disease: Secondary | ICD-10-CM | POA: Diagnosis not present

## 2019-11-08 DIAGNOSIS — N2581 Secondary hyperparathyroidism of renal origin: Secondary | ICD-10-CM | POA: Diagnosis not present

## 2019-11-08 DIAGNOSIS — D631 Anemia in chronic kidney disease: Secondary | ICD-10-CM | POA: Diagnosis not present

## 2019-11-08 DIAGNOSIS — D509 Iron deficiency anemia, unspecified: Secondary | ICD-10-CM | POA: Diagnosis not present

## 2019-11-08 DIAGNOSIS — D472 Monoclonal gammopathy: Secondary | ICD-10-CM | POA: Diagnosis not present

## 2019-11-09 DIAGNOSIS — Z23 Encounter for immunization: Secondary | ICD-10-CM | POA: Diagnosis not present

## 2019-11-10 DIAGNOSIS — N2581 Secondary hyperparathyroidism of renal origin: Secondary | ICD-10-CM | POA: Diagnosis not present

## 2019-11-10 DIAGNOSIS — N186 End stage renal disease: Secondary | ICD-10-CM | POA: Diagnosis not present

## 2019-11-10 DIAGNOSIS — D509 Iron deficiency anemia, unspecified: Secondary | ICD-10-CM | POA: Diagnosis not present

## 2019-11-10 DIAGNOSIS — Z992 Dependence on renal dialysis: Secondary | ICD-10-CM | POA: Diagnosis not present

## 2019-11-10 DIAGNOSIS — D472 Monoclonal gammopathy: Secondary | ICD-10-CM | POA: Diagnosis not present

## 2019-11-10 DIAGNOSIS — D631 Anemia in chronic kidney disease: Secondary | ICD-10-CM | POA: Diagnosis not present

## 2019-11-12 DIAGNOSIS — D631 Anemia in chronic kidney disease: Secondary | ICD-10-CM | POA: Diagnosis not present

## 2019-11-12 DIAGNOSIS — N186 End stage renal disease: Secondary | ICD-10-CM | POA: Diagnosis not present

## 2019-11-12 DIAGNOSIS — Z992 Dependence on renal dialysis: Secondary | ICD-10-CM | POA: Diagnosis not present

## 2019-11-12 DIAGNOSIS — D509 Iron deficiency anemia, unspecified: Secondary | ICD-10-CM | POA: Diagnosis not present

## 2019-11-12 DIAGNOSIS — N2581 Secondary hyperparathyroidism of renal origin: Secondary | ICD-10-CM | POA: Diagnosis not present

## 2019-11-12 DIAGNOSIS — D472 Monoclonal gammopathy: Secondary | ICD-10-CM | POA: Diagnosis not present

## 2019-11-13 DIAGNOSIS — D509 Iron deficiency anemia, unspecified: Secondary | ICD-10-CM | POA: Diagnosis not present

## 2019-11-13 DIAGNOSIS — N186 End stage renal disease: Secondary | ICD-10-CM | POA: Diagnosis not present

## 2019-11-13 DIAGNOSIS — N2581 Secondary hyperparathyroidism of renal origin: Secondary | ICD-10-CM | POA: Diagnosis not present

## 2019-11-13 DIAGNOSIS — D472 Monoclonal gammopathy: Secondary | ICD-10-CM | POA: Diagnosis not present

## 2019-11-13 DIAGNOSIS — D631 Anemia in chronic kidney disease: Secondary | ICD-10-CM | POA: Diagnosis not present

## 2019-11-13 DIAGNOSIS — Z992 Dependence on renal dialysis: Secondary | ICD-10-CM | POA: Diagnosis not present

## 2019-11-15 DIAGNOSIS — D509 Iron deficiency anemia, unspecified: Secondary | ICD-10-CM | POA: Diagnosis not present

## 2019-11-15 DIAGNOSIS — N186 End stage renal disease: Secondary | ICD-10-CM | POA: Diagnosis not present

## 2019-11-15 DIAGNOSIS — N2581 Secondary hyperparathyroidism of renal origin: Secondary | ICD-10-CM | POA: Diagnosis not present

## 2019-11-15 DIAGNOSIS — Z992 Dependence on renal dialysis: Secondary | ICD-10-CM | POA: Diagnosis not present

## 2019-11-15 DIAGNOSIS — D472 Monoclonal gammopathy: Secondary | ICD-10-CM | POA: Diagnosis not present

## 2019-11-15 DIAGNOSIS — D631 Anemia in chronic kidney disease: Secondary | ICD-10-CM | POA: Diagnosis not present

## 2019-11-17 DIAGNOSIS — D472 Monoclonal gammopathy: Secondary | ICD-10-CM | POA: Diagnosis not present

## 2019-11-17 DIAGNOSIS — D631 Anemia in chronic kidney disease: Secondary | ICD-10-CM | POA: Diagnosis not present

## 2019-11-17 DIAGNOSIS — D509 Iron deficiency anemia, unspecified: Secondary | ICD-10-CM | POA: Diagnosis not present

## 2019-11-17 DIAGNOSIS — N186 End stage renal disease: Secondary | ICD-10-CM | POA: Diagnosis not present

## 2019-11-17 DIAGNOSIS — Z992 Dependence on renal dialysis: Secondary | ICD-10-CM | POA: Diagnosis not present

## 2019-11-17 DIAGNOSIS — N2581 Secondary hyperparathyroidism of renal origin: Secondary | ICD-10-CM | POA: Diagnosis not present

## 2019-11-19 DIAGNOSIS — D472 Monoclonal gammopathy: Secondary | ICD-10-CM | POA: Diagnosis not present

## 2019-11-19 DIAGNOSIS — N2581 Secondary hyperparathyroidism of renal origin: Secondary | ICD-10-CM | POA: Diagnosis not present

## 2019-11-19 DIAGNOSIS — D509 Iron deficiency anemia, unspecified: Secondary | ICD-10-CM | POA: Diagnosis not present

## 2019-11-19 DIAGNOSIS — Z992 Dependence on renal dialysis: Secondary | ICD-10-CM | POA: Diagnosis not present

## 2019-11-19 DIAGNOSIS — D631 Anemia in chronic kidney disease: Secondary | ICD-10-CM | POA: Diagnosis not present

## 2019-11-19 DIAGNOSIS — N186 End stage renal disease: Secondary | ICD-10-CM | POA: Diagnosis not present

## 2019-11-22 DIAGNOSIS — N2581 Secondary hyperparathyroidism of renal origin: Secondary | ICD-10-CM | POA: Diagnosis not present

## 2019-11-22 DIAGNOSIS — D631 Anemia in chronic kidney disease: Secondary | ICD-10-CM | POA: Diagnosis not present

## 2019-11-22 DIAGNOSIS — D509 Iron deficiency anemia, unspecified: Secondary | ICD-10-CM | POA: Diagnosis not present

## 2019-11-22 DIAGNOSIS — Z992 Dependence on renal dialysis: Secondary | ICD-10-CM | POA: Diagnosis not present

## 2019-11-22 DIAGNOSIS — N186 End stage renal disease: Secondary | ICD-10-CM | POA: Diagnosis not present

## 2019-11-22 DIAGNOSIS — D472 Monoclonal gammopathy: Secondary | ICD-10-CM | POA: Diagnosis not present

## 2019-11-24 DIAGNOSIS — D509 Iron deficiency anemia, unspecified: Secondary | ICD-10-CM | POA: Diagnosis not present

## 2019-11-24 DIAGNOSIS — N186 End stage renal disease: Secondary | ICD-10-CM | POA: Diagnosis not present

## 2019-11-24 DIAGNOSIS — D472 Monoclonal gammopathy: Secondary | ICD-10-CM | POA: Diagnosis not present

## 2019-11-24 DIAGNOSIS — D631 Anemia in chronic kidney disease: Secondary | ICD-10-CM | POA: Diagnosis not present

## 2019-11-24 DIAGNOSIS — Z992 Dependence on renal dialysis: Secondary | ICD-10-CM | POA: Diagnosis not present

## 2019-11-24 DIAGNOSIS — N2581 Secondary hyperparathyroidism of renal origin: Secondary | ICD-10-CM | POA: Diagnosis not present

## 2019-11-26 DIAGNOSIS — N2581 Secondary hyperparathyroidism of renal origin: Secondary | ICD-10-CM | POA: Diagnosis not present

## 2019-11-26 DIAGNOSIS — Z992 Dependence on renal dialysis: Secondary | ICD-10-CM | POA: Diagnosis not present

## 2019-11-26 DIAGNOSIS — D509 Iron deficiency anemia, unspecified: Secondary | ICD-10-CM | POA: Diagnosis not present

## 2019-11-26 DIAGNOSIS — N186 End stage renal disease: Secondary | ICD-10-CM | POA: Diagnosis not present

## 2019-11-26 DIAGNOSIS — D631 Anemia in chronic kidney disease: Secondary | ICD-10-CM | POA: Diagnosis not present

## 2019-11-26 DIAGNOSIS — D472 Monoclonal gammopathy: Secondary | ICD-10-CM | POA: Diagnosis not present

## 2019-11-29 DIAGNOSIS — Z992 Dependence on renal dialysis: Secondary | ICD-10-CM | POA: Diagnosis not present

## 2019-11-29 DIAGNOSIS — D472 Monoclonal gammopathy: Secondary | ICD-10-CM | POA: Diagnosis not present

## 2019-11-29 DIAGNOSIS — D509 Iron deficiency anemia, unspecified: Secondary | ICD-10-CM | POA: Diagnosis not present

## 2019-11-29 DIAGNOSIS — N2581 Secondary hyperparathyroidism of renal origin: Secondary | ICD-10-CM | POA: Diagnosis not present

## 2019-11-29 DIAGNOSIS — D631 Anemia in chronic kidney disease: Secondary | ICD-10-CM | POA: Diagnosis not present

## 2019-11-29 DIAGNOSIS — N186 End stage renal disease: Secondary | ICD-10-CM | POA: Diagnosis not present

## 2019-12-01 DIAGNOSIS — D631 Anemia in chronic kidney disease: Secondary | ICD-10-CM | POA: Diagnosis not present

## 2019-12-01 DIAGNOSIS — D509 Iron deficiency anemia, unspecified: Secondary | ICD-10-CM | POA: Diagnosis not present

## 2019-12-01 DIAGNOSIS — Z992 Dependence on renal dialysis: Secondary | ICD-10-CM | POA: Diagnosis not present

## 2019-12-01 DIAGNOSIS — N186 End stage renal disease: Secondary | ICD-10-CM | POA: Diagnosis not present

## 2019-12-01 DIAGNOSIS — N2581 Secondary hyperparathyroidism of renal origin: Secondary | ICD-10-CM | POA: Diagnosis not present

## 2019-12-01 DIAGNOSIS — D472 Monoclonal gammopathy: Secondary | ICD-10-CM | POA: Diagnosis not present

## 2019-12-03 DIAGNOSIS — Z992 Dependence on renal dialysis: Secondary | ICD-10-CM | POA: Diagnosis not present

## 2019-12-03 DIAGNOSIS — D509 Iron deficiency anemia, unspecified: Secondary | ICD-10-CM | POA: Diagnosis not present

## 2019-12-03 DIAGNOSIS — D631 Anemia in chronic kidney disease: Secondary | ICD-10-CM | POA: Diagnosis not present

## 2019-12-03 DIAGNOSIS — N2581 Secondary hyperparathyroidism of renal origin: Secondary | ICD-10-CM | POA: Diagnosis not present

## 2019-12-03 DIAGNOSIS — D472 Monoclonal gammopathy: Secondary | ICD-10-CM | POA: Diagnosis not present

## 2019-12-03 DIAGNOSIS — N186 End stage renal disease: Secondary | ICD-10-CM | POA: Diagnosis not present

## 2019-12-04 DIAGNOSIS — D631 Anemia in chronic kidney disease: Secondary | ICD-10-CM | POA: Diagnosis not present

## 2019-12-04 DIAGNOSIS — N186 End stage renal disease: Secondary | ICD-10-CM | POA: Diagnosis not present

## 2019-12-04 DIAGNOSIS — Z992 Dependence on renal dialysis: Secondary | ICD-10-CM | POA: Diagnosis not present

## 2019-12-04 DIAGNOSIS — D509 Iron deficiency anemia, unspecified: Secondary | ICD-10-CM | POA: Diagnosis not present

## 2019-12-04 DIAGNOSIS — D472 Monoclonal gammopathy: Secondary | ICD-10-CM | POA: Diagnosis not present

## 2019-12-04 DIAGNOSIS — N2581 Secondary hyperparathyroidism of renal origin: Secondary | ICD-10-CM | POA: Diagnosis not present

## 2019-12-06 DIAGNOSIS — N2581 Secondary hyperparathyroidism of renal origin: Secondary | ICD-10-CM | POA: Diagnosis not present

## 2019-12-06 DIAGNOSIS — Z992 Dependence on renal dialysis: Secondary | ICD-10-CM | POA: Diagnosis not present

## 2019-12-06 DIAGNOSIS — D631 Anemia in chronic kidney disease: Secondary | ICD-10-CM | POA: Diagnosis not present

## 2019-12-06 DIAGNOSIS — N186 End stage renal disease: Secondary | ICD-10-CM | POA: Diagnosis not present

## 2019-12-06 DIAGNOSIS — D509 Iron deficiency anemia, unspecified: Secondary | ICD-10-CM | POA: Diagnosis not present

## 2019-12-06 DIAGNOSIS — D472 Monoclonal gammopathy: Secondary | ICD-10-CM | POA: Diagnosis not present

## 2019-12-10 DIAGNOSIS — D509 Iron deficiency anemia, unspecified: Secondary | ICD-10-CM | POA: Diagnosis not present

## 2019-12-10 DIAGNOSIS — N186 End stage renal disease: Secondary | ICD-10-CM | POA: Diagnosis not present

## 2019-12-10 DIAGNOSIS — Z992 Dependence on renal dialysis: Secondary | ICD-10-CM | POA: Diagnosis not present

## 2019-12-10 DIAGNOSIS — D472 Monoclonal gammopathy: Secondary | ICD-10-CM | POA: Diagnosis not present

## 2019-12-10 DIAGNOSIS — N2581 Secondary hyperparathyroidism of renal origin: Secondary | ICD-10-CM | POA: Diagnosis not present

## 2019-12-10 DIAGNOSIS — D631 Anemia in chronic kidney disease: Secondary | ICD-10-CM | POA: Diagnosis not present

## 2019-12-13 DIAGNOSIS — D631 Anemia in chronic kidney disease: Secondary | ICD-10-CM | POA: Diagnosis not present

## 2019-12-13 DIAGNOSIS — D472 Monoclonal gammopathy: Secondary | ICD-10-CM | POA: Diagnosis not present

## 2019-12-13 DIAGNOSIS — Z992 Dependence on renal dialysis: Secondary | ICD-10-CM | POA: Diagnosis not present

## 2019-12-13 DIAGNOSIS — D509 Iron deficiency anemia, unspecified: Secondary | ICD-10-CM | POA: Diagnosis not present

## 2019-12-13 DIAGNOSIS — N186 End stage renal disease: Secondary | ICD-10-CM | POA: Diagnosis not present

## 2019-12-13 DIAGNOSIS — N2581 Secondary hyperparathyroidism of renal origin: Secondary | ICD-10-CM | POA: Diagnosis not present

## 2019-12-15 DIAGNOSIS — Z992 Dependence on renal dialysis: Secondary | ICD-10-CM | POA: Diagnosis not present

## 2019-12-15 DIAGNOSIS — D509 Iron deficiency anemia, unspecified: Secondary | ICD-10-CM | POA: Diagnosis not present

## 2019-12-15 DIAGNOSIS — N2581 Secondary hyperparathyroidism of renal origin: Secondary | ICD-10-CM | POA: Diagnosis not present

## 2019-12-15 DIAGNOSIS — N186 End stage renal disease: Secondary | ICD-10-CM | POA: Diagnosis not present

## 2019-12-15 DIAGNOSIS — D472 Monoclonal gammopathy: Secondary | ICD-10-CM | POA: Diagnosis not present

## 2019-12-15 DIAGNOSIS — D631 Anemia in chronic kidney disease: Secondary | ICD-10-CM | POA: Diagnosis not present

## 2019-12-17 DIAGNOSIS — Z992 Dependence on renal dialysis: Secondary | ICD-10-CM | POA: Diagnosis not present

## 2019-12-17 DIAGNOSIS — D472 Monoclonal gammopathy: Secondary | ICD-10-CM | POA: Diagnosis not present

## 2019-12-17 DIAGNOSIS — D631 Anemia in chronic kidney disease: Secondary | ICD-10-CM | POA: Diagnosis not present

## 2019-12-17 DIAGNOSIS — N186 End stage renal disease: Secondary | ICD-10-CM | POA: Diagnosis not present

## 2019-12-17 DIAGNOSIS — N2581 Secondary hyperparathyroidism of renal origin: Secondary | ICD-10-CM | POA: Diagnosis not present

## 2019-12-17 DIAGNOSIS — D509 Iron deficiency anemia, unspecified: Secondary | ICD-10-CM | POA: Diagnosis not present

## 2019-12-20 DIAGNOSIS — D472 Monoclonal gammopathy: Secondary | ICD-10-CM | POA: Diagnosis not present

## 2019-12-20 DIAGNOSIS — Z992 Dependence on renal dialysis: Secondary | ICD-10-CM | POA: Diagnosis not present

## 2019-12-20 DIAGNOSIS — D631 Anemia in chronic kidney disease: Secondary | ICD-10-CM | POA: Diagnosis not present

## 2019-12-20 DIAGNOSIS — D509 Iron deficiency anemia, unspecified: Secondary | ICD-10-CM | POA: Diagnosis not present

## 2019-12-20 DIAGNOSIS — N186 End stage renal disease: Secondary | ICD-10-CM | POA: Diagnosis not present

## 2019-12-20 DIAGNOSIS — N2581 Secondary hyperparathyroidism of renal origin: Secondary | ICD-10-CM | POA: Diagnosis not present

## 2019-12-21 DIAGNOSIS — I4891 Unspecified atrial fibrillation: Secondary | ICD-10-CM | POA: Diagnosis not present

## 2019-12-21 DIAGNOSIS — Z299 Encounter for prophylactic measures, unspecified: Secondary | ICD-10-CM | POA: Diagnosis not present

## 2019-12-21 DIAGNOSIS — C9000 Multiple myeloma not having achieved remission: Secondary | ICD-10-CM | POA: Diagnosis not present

## 2019-12-21 DIAGNOSIS — J449 Chronic obstructive pulmonary disease, unspecified: Secondary | ICD-10-CM | POA: Diagnosis not present

## 2019-12-21 DIAGNOSIS — R21 Rash and other nonspecific skin eruption: Secondary | ICD-10-CM | POA: Diagnosis not present

## 2019-12-22 DIAGNOSIS — N2581 Secondary hyperparathyroidism of renal origin: Secondary | ICD-10-CM | POA: Diagnosis not present

## 2019-12-22 DIAGNOSIS — D509 Iron deficiency anemia, unspecified: Secondary | ICD-10-CM | POA: Diagnosis not present

## 2019-12-22 DIAGNOSIS — D472 Monoclonal gammopathy: Secondary | ICD-10-CM | POA: Diagnosis not present

## 2019-12-22 DIAGNOSIS — D631 Anemia in chronic kidney disease: Secondary | ICD-10-CM | POA: Diagnosis not present

## 2019-12-22 DIAGNOSIS — Z992 Dependence on renal dialysis: Secondary | ICD-10-CM | POA: Diagnosis not present

## 2019-12-22 DIAGNOSIS — N186 End stage renal disease: Secondary | ICD-10-CM | POA: Diagnosis not present

## 2019-12-24 DIAGNOSIS — D509 Iron deficiency anemia, unspecified: Secondary | ICD-10-CM | POA: Diagnosis not present

## 2019-12-24 DIAGNOSIS — N2581 Secondary hyperparathyroidism of renal origin: Secondary | ICD-10-CM | POA: Diagnosis not present

## 2019-12-24 DIAGNOSIS — Z992 Dependence on renal dialysis: Secondary | ICD-10-CM | POA: Diagnosis not present

## 2019-12-24 DIAGNOSIS — N186 End stage renal disease: Secondary | ICD-10-CM | POA: Diagnosis not present

## 2019-12-24 DIAGNOSIS — D631 Anemia in chronic kidney disease: Secondary | ICD-10-CM | POA: Diagnosis not present

## 2019-12-24 DIAGNOSIS — D472 Monoclonal gammopathy: Secondary | ICD-10-CM | POA: Diagnosis not present

## 2019-12-27 ENCOUNTER — Other Ambulatory Visit: Payer: Self-pay | Admitting: Student

## 2019-12-27 ENCOUNTER — Other Ambulatory Visit (HOSPITAL_COMMUNITY): Payer: Self-pay | Admitting: Nephrology

## 2019-12-27 DIAGNOSIS — N186 End stage renal disease: Secondary | ICD-10-CM

## 2019-12-27 DIAGNOSIS — Z992 Dependence on renal dialysis: Secondary | ICD-10-CM

## 2019-12-28 ENCOUNTER — Other Ambulatory Visit (HOSPITAL_COMMUNITY): Payer: Self-pay | Admitting: Nephrology

## 2019-12-28 ENCOUNTER — Encounter (HOSPITAL_COMMUNITY): Payer: Self-pay

## 2019-12-28 ENCOUNTER — Ambulatory Visit (HOSPITAL_COMMUNITY)
Admission: RE | Admit: 2019-12-28 | Discharge: 2019-12-28 | Disposition: A | Discharge status: Home / Self Care | Payer: Medicare Other | Source: Ambulatory Visit | Attending: Nephrology | Admitting: Nephrology

## 2019-12-28 ENCOUNTER — Other Ambulatory Visit: Payer: Self-pay

## 2019-12-28 DIAGNOSIS — N186 End stage renal disease: Secondary | ICD-10-CM | POA: Diagnosis not present

## 2019-12-28 DIAGNOSIS — I272 Pulmonary hypertension, unspecified: Secondary | ICD-10-CM | POA: Diagnosis not present

## 2019-12-28 DIAGNOSIS — T8611 Kidney transplant rejection: Secondary | ICD-10-CM | POA: Insufficient documentation

## 2019-12-28 DIAGNOSIS — Z992 Dependence on renal dialysis: Secondary | ICD-10-CM | POA: Insufficient documentation

## 2019-12-28 DIAGNOSIS — Z940 Kidney transplant status: Secondary | ICD-10-CM | POA: Insufficient documentation

## 2019-12-28 DIAGNOSIS — D472 Monoclonal gammopathy: Secondary | ICD-10-CM | POA: Diagnosis not present

## 2019-12-28 DIAGNOSIS — Z79899 Other long term (current) drug therapy: Secondary | ICD-10-CM | POA: Insufficient documentation

## 2019-12-28 DIAGNOSIS — I130 Hypertensive heart and chronic kidney disease with heart failure and stage 1 through stage 4 chronic kidney disease, or unspecified chronic kidney disease: Secondary | ICD-10-CM | POA: Diagnosis not present

## 2019-12-28 DIAGNOSIS — I4819 Other persistent atrial fibrillation: Secondary | ICD-10-CM | POA: Diagnosis not present

## 2019-12-28 DIAGNOSIS — I5030 Unspecified diastolic (congestive) heart failure: Secondary | ICD-10-CM | POA: Diagnosis not present

## 2019-12-28 DIAGNOSIS — E785 Hyperlipidemia, unspecified: Secondary | ICD-10-CM | POA: Insufficient documentation

## 2019-12-28 DIAGNOSIS — Z841 Family history of disorders of kidney and ureter: Secondary | ICD-10-CM | POA: Insufficient documentation

## 2019-12-28 DIAGNOSIS — Z4901 Encounter for fitting and adjustment of extracorporeal dialysis catheter: Secondary | ICD-10-CM | POA: Diagnosis not present

## 2019-12-28 HISTORY — PX: IR FLUORO GUIDE CV LINE RIGHT: IMG2283

## 2019-12-28 HISTORY — PX: IR US GUIDE VASC ACCESS RIGHT: IMG2390

## 2019-12-28 LAB — POCT I-STAT, CHEM 8
BUN: 53 mg/dL — ABNORMAL HIGH (ref 8–23)
Calcium, Ion: 1.04 mmol/L — ABNORMAL LOW (ref 1.15–1.40)
Chloride: 105 mmol/L (ref 98–111)
Creatinine, Ser: 10.6 mg/dL — ABNORMAL HIGH (ref 0.61–1.24)
Glucose, Bld: 86 mg/dL (ref 70–99)
HCT: 45 % (ref 39.0–52.0)
Hemoglobin: 15.3 g/dL (ref 13.0–17.0)
Potassium: 5 mmol/L (ref 3.5–5.1)
Sodium: 139 mmol/L (ref 135–145)
TCO2: 30 mmol/L (ref 22–32)

## 2019-12-28 LAB — GLUCOSE, CAPILLARY: Glucose-Capillary: 83 mg/dL (ref 70–99)

## 2019-12-28 LAB — POCT I-STAT CREATININE: Creatinine, Ser: 10.3 mg/dL — ABNORMAL HIGH (ref 0.61–1.24)

## 2019-12-28 MED ORDER — FENTANYL CITRATE (PF) 100 MCG/2ML IJ SOLN
INTRAMUSCULAR | Status: AC
  Filled 2019-12-28: qty 2

## 2019-12-28 MED ORDER — MIDAZOLAM HCL 2 MG/2ML IJ SOLN
INTRAMUSCULAR | Status: AC
  Filled 2019-12-28: qty 2

## 2019-12-28 MED ORDER — CEFAZOLIN SODIUM-DEXTROSE 2-4 GM/100ML-% IV SOLN
INTRAVENOUS | Status: AC
  Administered 2019-12-28: 2 g via INTRAVENOUS
  Filled 2019-12-28: qty 100

## 2019-12-28 MED ORDER — LIDOCAINE HCL (PF) 1 % IJ SOLN
INTRAMUSCULAR | Status: AC | PRN
  Administered 2019-12-28: 10 mL

## 2019-12-28 MED ORDER — SODIUM CHLORIDE 0.9 % IV SOLN: INTRAVENOUS | Status: DC

## 2019-12-28 MED ORDER — FENTANYL CITRATE (PF) 100 MCG/2ML IJ SOLN
INTRAMUSCULAR | Status: AC | PRN
  Administered 2019-12-28: 25 ug via INTRAVENOUS

## 2019-12-28 MED ORDER — HEPARIN SODIUM (PORCINE) 1000 UNIT/ML IJ SOLN
INTRAMUSCULAR | Status: AC
  Administered 2019-12-28: 3800 [IU]
  Filled 2019-12-28: qty 1

## 2019-12-28 MED ORDER — MIDAZOLAM HCL 2 MG/2ML IJ SOLN
INTRAMUSCULAR | Status: AC | PRN
  Administered 2019-12-28: 1 mg via INTRAVENOUS

## 2019-12-28 MED ORDER — LIDOCAINE-EPINEPHRINE 1 %-1:100000 IJ SOLN
INTRAMUSCULAR | Status: AC
  Filled 2019-12-28: qty 1

## 2019-12-28 MED ORDER — CEFAZOLIN SODIUM-DEXTROSE 2-4 GM/100ML-% IV SOLN: 2.0000 g | Freq: Once | INTRAVENOUS | Status: AC

## 2019-12-28 NOTE — Discharge Instructions (Addendum)
Tunneled Catheter Insertion, Care After This sheet gives you information about how to care for yourself after your procedure. Your health care provider may also give you more specific instructions. If you have problems or questions, contact your health care provider. What can I expect after the procedure? After the procedure, it is common to have:  Some mild redness, bruising, swelling, and pain around your catheter site.  A small amount of blood or clear fluid coming from your incisions. Follow these instructions at home: Incision care   Follow instructions from your health care provider about how to take care of your incisions. Make sure you: ? Wash your hands with soap and water before and after you change your bandages (dressings). If soap and water are not available, use hand sanitizer. ? Change your dressings as told by your health care provider. Wash the area around your incisions with a germ-killing (antiseptic) solution when you change your dressings. ? Leave stitches (sutures), skin glue, or adhesive strips in place. These skin closures may need to stay in place for 2 weeks or longer. If adhesive strip edges start to loosen and curl up, you may trim the loose edges. Do not remove adhesive strips completely unless your health care provider tells you to do that.  Keep your dressings clean and dry.  Check your incision areas every day for signs of infection. Check for: ? More redness, swelling, or pain. ? More fluid or blood. ? Warmth. ? Pus or a bad smell. Catheter care   Wash your hands with soap and water before and after caring for your catheter. If soap and water are not available, use hand sanitizer.  Keep your catheter site clean and dry.  Apply an antibiotic ointment to your catheter site as told by your health care provider.  Flush your catheter as told by your health care provider. This helps prevent it from becoming clogged.  Do not open the caps on the ends of  the catheter.  Do not pull on your catheter. Medicines  Take over-the-counter and prescription medicines only as told by your health care provider.  If you were prescribed an antibiotic medicine, take it as told by your health care provider. Do not stop taking the antibiotic even if you start to feel better. Activity  Return to your normal activities as told by your health care provider. Ask your health care provider what activities are safe for you.  Follow any other activity restrictions as instructed by your health care provider.  Do not lift anything that is heavier than 10 lb (4.5 kg), or the limit that you are told, until your health care provider says that it is safe. Driving  Do not drive until your health care provider approves.  Ask your health care provider if the medicine prescribed to you requires you to avoid driving or using heavy machinery. General instructions  Follow your health care provider's specific instructions for the type of catheter that you have.  Do not take baths, swim, or use a hot tub until your health care provider approves. Ask your health care provider if you may take showers.  Keep all follow-up visits as told by your health care provider. This is important. Contact a health care provider if:  You feel unusually weak or nauseous.  You have more redness, swelling, or pain at your incisions or around the area where your catheter is inserted.  Your catheter is not working properly.  You are unable to flush your catheter.   Get help right away if:  Your catheter develops a hole or it breaks.  You have pain or swelling when fluids or medicines are being given through the catheter.  Fluid is leaking from the catheter, under the dressing, or around the dressing.  Your catheter comes loose or gets pulled completely out. If this happens, press on your catheter site firmly with a clean cloth until you can get medical help.  You have swelling in  your shoulder, neck, chest, or face.  You have chest pain or difficulty breathing.  You feel dizzy or light-headed.  You have pus or a bad smell coming from your catheter site.  You have a fever or chills.  Your catheter site feels warm to the touch.  You develop bleeding from your catheter or your insertion site, and your bleeding does not stop. Summary  After the procedure, it is common to have mild redness, swelling, and pain around your catheter site.  Return to your normal activities as told by your health care provider. Ask your health care provider what activities are safe for you.  Follow your health care provider's specific instructions for the type of catheter that you have.  Keep your catheter site and your dressings clean and dry.  Contact a health care provider if your catheter is not working properly. Get help right away if you have chest pain, fever, or difficulty breathing. This information is not intended to replace advice given to you by your health care provider. Make sure you discuss any questions you have with your health care provider. Document Revised: 07/14/2018 Document Reviewed: 07/14/2018 Elsevier Patient Education  2020 [employer] Moderate Conscious Sedation, Adult Sedation is the use of medicines to promote relaxation and relieve discomfort and anxiety. Moderate conscious sedation is a type of sedation. Under moderate conscious sedation, you are less alert than normal, but you are still able to respond to instructions, touch, or both. Moderate conscious sedation is used during short medical and dental procedures. It is milder than deep sedation, which is a type of sedation under which you cannot be easily woken up. It is also milder than general anesthesia, which is the use of medicines to make you unconscious. Moderate conscious sedation allows you to return to your regular activities sooner. Tell a health care provider about:  Any allergies you  have.  All medicines you are taking, including vitamins, herbs, eye drops, creams, and over-the-counter medicines.  Use of steroids (by mouth or creams).  Any problems you or family members have had with sedatives and anesthetic medicines.  Any blood disorders you have.  Any surgeries you have had.  Any medical conditions you have, such as sleep apnea.  Whether you are pregnant or may be pregnant.  Any use of cigarettes, alcohol, marijuana, or street drugs. What are the risks? Generally, this is a safe procedure. However, problems may occur, including:  Getting too much medicine (oversedation).  Nausea.  Allergic reaction to medicines.  Trouble breathing. If this happens, a breathing tube may be used to help with breathing. It will be removed when you are awake and breathing on your own.  Heart trouble.  Lung trouble. What happens before the procedure? Staying hydrated Follow instructions from your health care provider about hydration, which may include:  Up to 2 hours before the procedure - you may continue to drink clear liquids, such as water, clear fruit juice, black coffee, and plain tea. Eating and drinking restrictions Follow instructions from your health care   provider about eating and drinking, which may include:  8 hours before the procedure - stop eating heavy meals or foods such as meat, fried foods, or fatty foods.  6 hours before the procedure - stop eating light meals or foods, such as toast or cereal.  6 hours before the procedure - stop drinking milk or drinks that contain milk.  2 hours before the procedure - stop drinking clear liquids. Medicine Ask your health care provider about:  Changing or stopping your regular medicines. This is especially important if you are taking diabetes medicines or blood thinners.  Taking medicines such as aspirin and ibuprofen. These medicines can thin your blood. Do not take these medicines before your procedure if  your health care provider instructs you not to.  Tests and exams  You will have a physical exam.  You may have blood tests done to show: ? How well your kidneys and liver are working. ? How well your blood can clot. General instructions  Plan to have someone take you home from the hospital or clinic.  If you will be going home right after the procedure, plan to have someone with you for 24 hours. What happens during the procedure?  An IV tube will be inserted into one of your veins.  Medicine to help you relax (sedative) will be given through the IV tube.  The medical or dental procedure will be performed. What happens after the procedure?  Your blood pressure, heart rate, breathing rate, and blood oxygen level will be monitored often until the medicines you were given have worn off.  Do not drive for 24 hours. This information is not intended to replace advice given to you by your health care provider. Make sure you discuss any questions you have with your health care provider. Document Revised: 07/04/2017 Document Reviewed: 11/11/2015 Elsevier Patient Education  2020 [employer] Inc.  

## 2019-12-28 NOTE — H&P (Signed)
Chief Complaint: Patient was seen in consultation today for (L)UE AVFistulogram at the request of [place]  Referring Physician(s): Redbird Smith S  Supervising Physician: Sandi Mariscal  Patient Status: [facility] - Out-pt  History of Present Illness: TAHMID Liu is a 69 y.o. male with hx of ESRD. Gets HD via (L)UE AVF that he has had for several years. They've recently noticed decreased acces flows during his HD sessions and is referred for AV fistulogram. This IR team last worked on this in 2019. PMHx, meds, labs, imaging, allergies reviewed. Feels well, no recent fevers, chills, illness. Has been NPO today as directed.   Past Medical History:  Diagnosis Date  . Atrial fibrillation (HCC)    Permanent, Not on Coumadin because of history of lower GI bleed  . Diastolic dysfunction    Restrictive  diastolic filling pattern echo,  February, 2013  . Dyslipidemia   . Ejection fraction    EF 60-65%, echo, 2013  . ESRD (end stage renal disease) ([place])    Dialysis, fistula in left arm  . Hypertension   . Lower gastrointestinal bleed   . LVH (left ventricular hypertrophy)   . 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 Mitral regurgitation    Mild by echo, 2013  . Multiple myeloma ([place]) 04/30/2016  . Pancytopenia ([place])    Dr Ignacia Bayley  . Pulmonary hypertension (HCC)    Severe, PA pressure 65-70 mm of mercury, echo, 2013  . Tobacco abuse     Past Surgical History:  Procedure Laterality Date  . A/V FISTULAGRAM Left 04/03/2017   Procedure: A/V Fistulagram;  Surgeon: Conrad Aneth, MD;  Location: [place] [facility];  Service: Cardiovascular;  Laterality: Left;  . A/V FISTULAGRAM Left 08/06/2018   Procedure: A/V FISTULAGRAM;  Surgeon: [name], MD;  Location: [place] [facility];  Service: Cardiovascular;  Laterality: Left;  . AV FISTULA PLACEMENT Left 1992   Left arm   . IR FLUORO GUIDE CV LINE RIGHT  06/23/2018  . IR REMOVAL TUN CV  CATH W/O FL  08/20/2018  . IR US GUIDE VASC ACCESS RIGHT  06/23/2018  . 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 PERIPHERAL VASCULAR BALLOON ANGIOPLASTY Left 08/06/2018   Procedure: PERIPHERAL VASCULAR BALLOON ANGIOPLASTY;  Surgeon: [name], MD;  Location: [place] [facility];  Service: Cardiovascular;  Laterality: Left;    Allergies: Other  Medications: Prior to Admission medications   Medication Sig Start Date End Date Taking? Authorizing Provider  cinacalcet (SENSIPAR) 30 MG tablet Take 30 mg by mouth at bedtime.    Yes [provider]  dorzolamide-timolol (COSOPT) 22.3-6.8 MG/ML ophthalmic solution Place 1 drop into both eyes 2 (two) times daily.    Yes [provider]  ferric citrate (AURYXIA) 1 GM 210 MG(Fe) tablet Take 210 mg by mouth 3 (three) times daily with meals.   Yes [provider]  gabapentin (NEURONTIN) 100 MG capsule Take 200 mg by mouth at bedtime.  06/14/18  Yes [provider]  midodrine (PROAMATINE) 10 MG tablet Take 10 mg by mouth daily.  04/10/17  Yes [provider]  mirtazapine (REMERON) 7.5 MG tablet Take 7.5 mg by mouth at bedtime. 04/01/17  Yes [provider]  multivitamin (RENA-VIT) TABS tablet Take 1 tablet by mouth daily.   Yes [provider]     Family History  Problem Relation Age of Onset  . Other Mother        Deceased, 21  . Stomach cancer Father        Deceased, 53  . Kidney failure Son   . Healthy Son     Social History   Socioeconomic History  . Marital status: Widowed    Spouse name: Not on file  . Number of children: Not on file  . Years of education: Not on file  . Highest education  level: Not on file  Occupational History  . Not on file  Tobacco Use  . Smoking status: Never Smoker  . Smokeless tobacco: Never Used  Substance and Sexual Activity  . Alcohol use: No    Alcohol/week: 0.0 standard drinks  . Drug use: No  . Sexual activity: Not on file  Other Topics Concern  . Not on file  Social History Narrative   Lives alone in a one story home.  Has 2 sons.     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     Education high school.    Social Determinants of Health   Financial Resource Strain:   . Difficulty of Paying Living Expenses:   Food Insecurity:   . Worried About Charity fundraiser in the Last Year:   . Arboriculturist in the Last Year:   Transportation Needs:   . Film/video editor (Medical):   [name] Lack of Transportation (Non-Medical):   Physical Activity:   . Days of Exercise per Week:   . Minutes of Exercise per Session:   Stress:   . Feeling of Stress :   Social Connections:   . Frequency of Communication with Friends and Family:   . Frequency of Social Gatherings with Friends and Family:   . Attends Religious Services:   . Active Member of Clubs or Organizations:   . Attends Archivist Meetings:   [name] Marital Status:     Review of Systems: A 12 point ROS discussed and pertinent positives are indicated in the HPI above.  All other systems are negative.  Review of Systems  Vital Signs: BP (!) 141/108   Pulse 71   Temp 97.6 F (36.4 C) (Skin)   Resp 18   Ht '6\' 2"'  (1.88 m)   Wt 68 kg   SpO2 95%   BMI 19.26 kg/m   Physical Exam Constitutional:      Appearance: Normal appearance. He is not ill-appearing.  HENT:     Mouth/Throat:     Mouth: Mucous membranes are moist.     Pharynx: Oropharynx is clear.  Cardiovascular:     Rate and Rhythm: Normal rate and regular rhythm.     Heart sounds: Normal heart sounds.  Pulmonary:     Effort: Pulmonary effort is normal. No respiratory  distress.     Breath sounds: Normal breath sounds.  Musculoskeletal:     Comments: (L) UE AVF palpable, significant aneurysmal changes.  Skin:    General: Skin is warm and dry.  Neurological:     General: No focal deficit present.     Mental Status: He is alert and oriented to person, place, and time.  Psychiatric:        Mood and Affect: Mood normal.  Thought Content: Thought content normal.        Judgment: Judgment normal.    Assessment and Plan: ESRD Dysfunctional (L)UE AVF For fistulogram Risks and benefits discussed with the patient including, but not limited to bleeding, infection, vascular injury, pulmonary embolism, need for tunneled HD catheter placement or even death.  All of the patient's questions were answered, patient is agreeable to proceed. Consent signed and in chart.    Thank you for this interesting consult.  I greatly enjoyed meeting THURSTON BRENDLINGER and look forward to participating in their care.  A copy of this report was sent to the requesting provider on this date.  Electronically Signed: Ascencion Dike, PA-C 12/28/2019, 11:32 AM   I spent a total of 20 minutes in face to face in clinical consultation, greater than 50% of which was counseling/coordinating care for AV fistulogram

## 2019-12-28 NOTE — Procedures (Signed)
Pre-procedure Diagnosis: ESRD Post-procedure Diagnosis: Same  Successful placement of tunneled HD catheter with tips terminating within the superior aspect of the right atrium.    Complications: None Immediate  EBL: None   The catheter is ready for immediate use.   Jay Jawana Reagor, MD Pager #: [phone]   

## 2019-12-29 DIAGNOSIS — Z992 Dependence on renal dialysis: Secondary | ICD-10-CM | POA: Diagnosis not present

## 2019-12-29 DIAGNOSIS — N186 End stage renal disease: Secondary | ICD-10-CM | POA: Diagnosis not present

## 2019-12-29 DIAGNOSIS — N2581 Secondary hyperparathyroidism of renal origin: Secondary | ICD-10-CM | POA: Diagnosis not present

## 2019-12-29 DIAGNOSIS — D631 Anemia in chronic kidney disease: Secondary | ICD-10-CM | POA: Diagnosis not present

## 2019-12-29 DIAGNOSIS — D509 Iron deficiency anemia, unspecified: Secondary | ICD-10-CM | POA: Diagnosis not present

## 2019-12-29 DIAGNOSIS — D472 Monoclonal gammopathy: Secondary | ICD-10-CM | POA: Diagnosis not present

## 2019-12-31 DIAGNOSIS — N186 End stage renal disease: Secondary | ICD-10-CM | POA: Diagnosis not present

## 2019-12-31 DIAGNOSIS — N2581 Secondary hyperparathyroidism of renal origin: Secondary | ICD-10-CM | POA: Diagnosis not present

## 2019-12-31 DIAGNOSIS — D631 Anemia in chronic kidney disease: Secondary | ICD-10-CM | POA: Diagnosis not present

## 2019-12-31 DIAGNOSIS — D509 Iron deficiency anemia, unspecified: Secondary | ICD-10-CM | POA: Diagnosis not present

## 2019-12-31 DIAGNOSIS — Z992 Dependence on renal dialysis: Secondary | ICD-10-CM | POA: Diagnosis not present

## 2019-12-31 DIAGNOSIS — D472 Monoclonal gammopathy: Secondary | ICD-10-CM | POA: Diagnosis not present

## 2020-01-03 DIAGNOSIS — N186 End stage renal disease: Secondary | ICD-10-CM | POA: Diagnosis not present

## 2020-01-03 DIAGNOSIS — D509 Iron deficiency anemia, unspecified: Secondary | ICD-10-CM | POA: Diagnosis not present

## 2020-01-03 DIAGNOSIS — D631 Anemia in chronic kidney disease: Secondary | ICD-10-CM | POA: Diagnosis not present

## 2020-01-03 DIAGNOSIS — N2581 Secondary hyperparathyroidism of renal origin: Secondary | ICD-10-CM | POA: Diagnosis not present

## 2020-01-03 DIAGNOSIS — Z992 Dependence on renal dialysis: Secondary | ICD-10-CM | POA: Diagnosis not present

## 2020-01-03 DIAGNOSIS — D472 Monoclonal gammopathy: Secondary | ICD-10-CM | POA: Diagnosis not present

## 2020-01-13 ENCOUNTER — Encounter (HOSPITAL_COMMUNITY): Payer: Medicare Other

## 2020-01-13 ENCOUNTER — Ambulatory Visit: Payer: Medicare Other | Admitting: Vascular Surgery

## 2020-01-13 ENCOUNTER — Other Ambulatory Visit (HOSPITAL_COMMUNITY): Payer: Medicare Other

## 2020-01-14 ENCOUNTER — Other Ambulatory Visit (HOSPITAL_COMMUNITY): Payer: Self-pay | Admitting: Nephrology

## 2020-01-14 DIAGNOSIS — N186 End stage renal disease: Secondary | ICD-10-CM

## 2020-01-17 ENCOUNTER — Other Ambulatory Visit: Payer: Self-pay | Admitting: *Deleted

## 2020-01-17 DIAGNOSIS — N186 End stage renal disease: Secondary | ICD-10-CM

## 2020-01-18 ENCOUNTER — Other Ambulatory Visit: Payer: Self-pay

## 2020-01-18 ENCOUNTER — Encounter: Payer: Self-pay | Admitting: Vascular Surgery

## 2020-01-18 ENCOUNTER — Ambulatory Visit (HOSPITAL_COMMUNITY)
Admission: RE | Admit: 2020-01-18 | Discharge: 2020-01-18 | Disposition: A | Discharge status: Home / Self Care | Payer: Medicare Other | Source: Ambulatory Visit | Attending: Vascular Surgery | Admitting: Vascular Surgery

## 2020-01-18 ENCOUNTER — Ambulatory Visit (INDEPENDENT_AMBULATORY_CARE_PROVIDER_SITE_OTHER)
Admission: RE | Admit: 2020-01-18 | Discharge: 2020-01-18 | Disposition: A | Discharge status: Home / Self Care | Payer: Medicare Other | Source: Ambulatory Visit | Attending: Vascular Surgery | Admitting: Vascular Surgery

## 2020-01-18 ENCOUNTER — Ambulatory Visit (INDEPENDENT_AMBULATORY_CARE_PROVIDER_SITE_OTHER): Payer: Medicare Other | Admitting: Vascular Surgery

## 2020-01-18 VITALS — BP 108/60 | HR 70 | Temp 97.2°F | Resp 14

## 2020-01-18 DIAGNOSIS — Z992 Dependence on renal dialysis: Secondary | ICD-10-CM | POA: Diagnosis not present

## 2020-01-18 DIAGNOSIS — N186 End stage renal disease: Secondary | ICD-10-CM

## 2020-01-18 NOTE — Progress Notes (Signed)
Patient name: Brian Liu MRN: [id number] DOB: 1950/12/03 Sex: male  REASON FOR VISIT: Evaluate for new permanent dialysis access  HPI: Brian Liu is a 69 y.o. male with multiple medical comorbidities including atrial fibrillation, end-stage renal disease on hemodialysis that presents for evaluation of new access.  He has a left radiocephalic AV fistula that was placed 23 years ago.  That is now thrombosed.  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  Patient states he is left-handed.  He is on dialysis Monday Wednesday Friday.  Not on any blood thinners.  Here with his son today.  Past Medical History:  Diagnosis Date  . Atrial fibrillation (HCC)    Permanent, Not on Coumadin because of history of lower GI bleed  . Diastolic dysfunction    Restrictive  diastolic filling pattern echo,  February, 2013  . Dyslipidemia   . Ejection fraction    EF 60-65%, echo, 2013  . ESRD (end stage renal disease) ([place])    Dialysis, fistula in left arm  . Hypertension   . Lower gastrointestinal bleed   . LVH (left ventricular hypertrophy)   . 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 Mitral regurgitation    Mild by echo, 2013  . Multiple myeloma ([place]) 04/30/2016  . Pancytopenia ([place])    Dr Ignacia Bayley  . Pulmonary hypertension (HCC)    Severe, PA pressure 65-70 mm of mercury, echo, 2013  . Tobacco abuse     Past Surgical History:  Procedure Laterality Date  . A/V FISTULAGRAM Left 04/03/2017   Procedure: A/V Fistulagram;  Surgeon: Conrad [place], MD;  Location: [place] [facility];  Service: Cardiovascular;  Laterality: Left;  . A/V FISTULAGRAM Left 08/06/2018   Procedure: A/V FISTULAGRAM;  Surgeon: [name], MD;  Location: [place] [facility];  Service: Cardiovascular;  Laterality: Left;  . AV FISTULA PLACEMENT Left 1992   Left arm   . IR FLUORO GUIDE CV LINE RIGHT   06/23/2018  . IR FLUORO GUIDE CV LINE RIGHT  12/28/2019  . IR REMOVAL TUN CV CATH W/O FL  08/20/2018  . IR US GUIDE VASC ACCESS RIGHT  06/23/2018  . IR US GUIDE VASC ACCESS RIGHT  12/28/2019  . 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 PERIPHERAL VASCULAR BALLOON ANGIOPLASTY Left 08/06/2018   Procedure: PERIPHERAL VASCULAR BALLOON ANGIOPLASTY;  Surgeon: [name], MD;  Location: [place] [facility];  Service: Cardiovascular;  Laterality: Left;    Family History  Problem Relation Age of Onset  . Other Mother        Deceased, 28  . Stomach cancer Father        Deceased, 36  . Kidney failure Son   . Healthy Son     SOCIAL HISTORY: Social History   Tobacco Use  . Smoking status: Never Smoker  . Smokeless tobacco: Never Used  Substance Use Topics  . Alcohol use: No    Alcohol/week: 0.0 standard drinks    Allergies  Allergen Reactions  . Other     Hydrocodone cough syrup makes him itch    Current Outpatient Medications  Medication Sig  Dispense Refill  . cinacalcet (SENSIPAR) 30 MG tablet Take 30 mg by mouth at bedtime.     . dorzolamide-timolol (COSOPT) 22.3-6.8 MG/ML ophthalmic solution Place 1 drop into both eyes 2 (two) times daily.     . ferric citrate (AURYXIA) 1 GM 210 MG(Fe) tablet Take 210 mg by mouth 3 (three) times daily with meals.    . gabapentin (NEURONTIN) 100 MG capsule Take 200 mg by mouth at bedtime.   6  . midodrine (PROAMATINE) 10 MG tablet Take 10 mg by mouth daily.     . mirtazapine (REMERON) 7.5 MG tablet Take 7.5 mg by mouth at bedtime.    . multivitamin (RENA-VIT) TABS tablet Take 1 tablet by mouth daily.     No current facility-administered medications for this visit.     REVIEW OF SYSTEMS:  '[X]'  denotes positive finding, '[ ]'  denotes negative finding Cardiac  Comments:  Chest pain or chest pressure:    Shortness of breath upon exertion:    Short of breath when lying flat:    Irregular heart rhythm:        Vascular    Pain in calf, thigh, or hip brought on by ambulation:    Pain in feet at night that wakes you up from your sleep:     Blood clot in your veins:    Leg swelling:         Pulmonary    Oxygen at home:    Productive cough:     Wheezing:         Neurologic    Sudden weakness in arms or legs:     Sudden numbness in arms or legs:     Sudden onset of difficulty speaking or slurred speech:    Temporary loss of vision in one eye:     Problems with dizziness:         Gastrointestinal    Blood in stool:     Vomited blood:         Genitourinary    Burning when urinating:     Blood in urine:        Psychiatric    Major depression:         Hematologic    Bleeding problems:    Problems with blood clotting too easily:        Skin    Rashes or ulcers:        Constitutional    Fever or chills:      PHYSICAL EXAM: There were no vitals filed for this visit.  GENERAL: The patient is a well-nourished male, in no acute distress. The vital signs are documented above. CARDIAC: There is a regular rate and rhythm.  VASCULAR:  Left radiocephalic fistula with no thrill and 2 large tandem aneurysms, no left radial pulse Right radial and brachial pulse 1+ palpable Rigjt IJ tunneled catheter PULMONARY: There is good air exchange bilaterally without wheezing or rales. ABDOMEN: Soft and non-tender with normal pitched bowel sounds.  MUSCULOSKELETAL: There are no major deformities or cyanosis. SKIN: There are no ulcers or rashes noted. PSYCHIATRIC: The patient has a normal affect.  DATA:   I independently reviewed his vein mapping and he has no usable superficial vein in the right arm.    Assessment/Plan:  69 year old male with  end-stage renal disease presents for permanent hemodialysis access.  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  I discussed  with him and his son that given dampened monophasic flow on his left upper extremity arterial duplex suggesting significant inflow disease as well as a central stenosis that will limit outflow, I would favor avoiding the left arm at this time.  I favor going to his right arm and discussed no good surface veins but would reevaluate with ultrasound in the OR and likely plan for right upper arm AV graft.  He currently has a right IJ tunneled catheter that was placed several weeks ago.  States this is being exchanged on Thursday.  Will schedule right arm AVF vs graft on non-dialysis day.   [name], MD [facility] Office: [place]

## 2020-01-19 ENCOUNTER — Other Ambulatory Visit: Payer: Self-pay | Admitting: Physician Assistant

## 2020-01-19 ENCOUNTER — Other Ambulatory Visit: Payer: Self-pay | Admitting: Radiology

## 2020-01-20 ENCOUNTER — Other Ambulatory Visit (HOSPITAL_COMMUNITY): Payer: Self-pay | Admitting: Nephrology

## 2020-01-20 ENCOUNTER — Other Ambulatory Visit: Payer: Self-pay

## 2020-01-20 ENCOUNTER — Ambulatory Visit (HOSPITAL_COMMUNITY)
Admission: RE | Admit: 2020-01-20 | Discharge: 2020-01-20 | Disposition: A | Discharge status: Home / Self Care | Payer: Medicare Other | Source: Ambulatory Visit | Attending: Nephrology | Admitting: Nephrology

## 2020-01-20 DIAGNOSIS — Y841 Kidney dialysis as the cause of abnormal reaction of the patient, or of later complication, without mention of misadventure at the time of the procedure: Secondary | ICD-10-CM | POA: Diagnosis not present

## 2020-01-20 DIAGNOSIS — N186 End stage renal disease: Secondary | ICD-10-CM

## 2020-01-20 DIAGNOSIS — Z992 Dependence on renal dialysis: Secondary | ICD-10-CM | POA: Diagnosis not present

## 2020-01-20 DIAGNOSIS — T82510A Breakdown (mechanical) of surgically created arteriovenous fistula, initial encounter: Secondary | ICD-10-CM | POA: Diagnosis not present

## 2020-01-20 HISTORY — PX: IR PTA VENOUS EXCEPT DIALYSIS CIRCUIT: IMG6126

## 2020-01-20 HISTORY — PX: IR VENOCAVAGRAM SVC: IMG679

## 2020-01-20 HISTORY — PX: IR FLUORO GUIDE CV LINE RIGHT: IMG2283

## 2020-01-20 MED ORDER — CEFAZOLIN SODIUM-DEXTROSE 2-4 GM/100ML-% IV SOLN
INTRAVENOUS | Status: AC
  Filled 2020-01-20: qty 100

## 2020-01-20 MED ORDER — CHLORHEXIDINE GLUCONATE 4 % EX LIQD
CUTANEOUS | Status: AC
  Filled 2020-01-20: qty 15

## 2020-01-20 MED ORDER — IOHEXOL 300 MG/ML  SOLN
50.0000 mL | Freq: Once | INTRAMUSCULAR | Status: AC | PRN
  Administered 2020-01-20: 15 mL via INTRAVENOUS

## 2020-01-20 MED ORDER — HEPARIN SODIUM (PORCINE) 1000 UNIT/ML IJ SOLN
INTRAMUSCULAR | Status: AC
  Filled 2020-01-20: qty 1

## 2020-01-20 MED ORDER — CEFAZOLIN SODIUM-DEXTROSE 2-4 GM/100ML-% IV SOLN
2.0000 g | INTRAVENOUS | Status: AC
  Administered 2020-01-20: 2 g via INTRAVENOUS

## 2020-01-20 MED ORDER — LIDOCAINE HCL 1 % IJ SOLN
INTRAMUSCULAR | Status: AC
  Filled 2020-01-20: qty 20

## 2020-02-01 ENCOUNTER — Other Ambulatory Visit: Payer: Self-pay

## 2020-02-03 DIAGNOSIS — N186 End stage renal disease: Secondary | ICD-10-CM | POA: Diagnosis not present

## 2020-02-03 DIAGNOSIS — D509 Iron deficiency anemia, unspecified: Secondary | ICD-10-CM | POA: Diagnosis not present

## 2020-02-03 DIAGNOSIS — D472 Monoclonal gammopathy: Secondary | ICD-10-CM | POA: Diagnosis not present

## 2020-02-03 DIAGNOSIS — Z992 Dependence on renal dialysis: Secondary | ICD-10-CM | POA: Diagnosis not present

## 2020-02-03 DIAGNOSIS — N2581 Secondary hyperparathyroidism of renal origin: Secondary | ICD-10-CM | POA: Diagnosis not present

## 2020-02-03 DIAGNOSIS — D631 Anemia in chronic kidney disease: Secondary | ICD-10-CM | POA: Diagnosis not present

## 2020-02-07 DIAGNOSIS — D509 Iron deficiency anemia, unspecified: Secondary | ICD-10-CM | POA: Diagnosis not present

## 2020-02-07 DIAGNOSIS — N186 End stage renal disease: Secondary | ICD-10-CM | POA: Diagnosis not present

## 2020-02-07 DIAGNOSIS — D631 Anemia in chronic kidney disease: Secondary | ICD-10-CM | POA: Diagnosis not present

## 2020-02-07 DIAGNOSIS — Z992 Dependence on renal dialysis: Secondary | ICD-10-CM | POA: Diagnosis not present

## 2020-02-07 DIAGNOSIS — D472 Monoclonal gammopathy: Secondary | ICD-10-CM | POA: Diagnosis not present

## 2020-02-07 DIAGNOSIS — N2581 Secondary hyperparathyroidism of renal origin: Secondary | ICD-10-CM | POA: Diagnosis not present

## 2020-02-08 ENCOUNTER — Encounter (HOSPITAL_COMMUNITY): Payer: Self-pay | Admitting: Vascular Surgery

## 2020-02-08 ENCOUNTER — Other Ambulatory Visit (HOSPITAL_COMMUNITY): Payer: Medicare Other

## 2020-02-09 ENCOUNTER — Encounter (HOSPITAL_COMMUNITY): Payer: Self-pay | Admitting: Vascular Surgery

## 2020-02-09 NOTE — Progress Notes (Signed)
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 He was last seen by Dr. Bronson Ing 11/06/17 and at that time was symptomatically stable. Per notes from Dr. Bronson Ing and Dr. Burt Knack, it was decided pt was not a candidate for TAVR due to medical comorbidities and the plan was for continued medical management. Per note, "Due to the patient's multiple comorbidities including marked weakness, nearly wheelchair-bound, multiple myeloma not tolerating therapy, and long-standing end-stage renal disease on dialysis for at least 20 years, he was felt to be a poor candidate for TAVR.  Dr. Burt Knack felt that the potential for harm and putting him through the workup and ultimate surgery probably outweigh the potential benefit."  ESRD on HD MWF. Has been on HD for more than 20 years. 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 He currently has a right IJ tunneled catheter.  Discussed case with Drs. Hodierne and R. Fitzgerald. Okay to proceed as planned barring any acute status change. Will need DOS labs and eval.   EKG 08/06/18: Atrial fibrillation with slow ventricular response. Rate 58. Left axis deviation. Incomplete right bundle branch block. Septal infarct , age undetermined. Similar to prior.  TTE 10/30/17: - Left ventricle: The cavity size was normal. Wall thickness was  increased in a pattern of mild LVH. Systolic function was  vigorous. The estimated ejection fraction was in the range of 65%  to 70%. Wall motion was normal; there were no regional wall  motion abnormalities. The study is not technically sufficient to  allow evaluation of LV diastolic function in the setting of  atrial fibrillation/flutter.  - Aortic valve: Moderately calcified annulus. Trileaflet;   moderately calcified leaflets. Cusp separation was severely  reduced. There was severe stenosis. There was mild regurgitation.  Mean gradient (S): 17 mm Hg. Peak gradient (S): 37 mm Hg. Valve  area (VTI): 0.81 cm^2. Peak velocity ratio of LVOT to aortic  valve: 0.3. Valve area (Vmax): 0.86 cm^2. Valve area (Vmean):  0.96 cm^2.  - Mitral valve: Moderately calcified annulus. Moderately calcified  leaflets . There was mild to moderate regurgitation directed  posteriorly.  - Left atrium: The atrium was severely dilated.  - Right atrium: The atrium was severely dilated. Central venous  pressure (est): 3 mm Hg.  - Atrial septum: No defect or patent foramen ovale was identified.  - Tricuspid valve: There was mild regurgitation.  - Pulmonary arteries: PA peak pressure: 37 mm Hg (S).  - Pericardium, extracardiac: There was no pericardial effusion.    Wynonia Musty [facility] Short Stay Center/Anesthesiology Phone [phone] 02/09/2020 1:43 PM

## 2020-02-09 NOTE — Anesthesia Preprocedure Evaluation (Addendum)
Anesthesia Evaluation  Patient identified by MRN, date of birth, ID band Patient awake    Reviewed: Allergy & Precautions, NPO status , Patient's Chart, lab work & pertinent test results  Airway Mallampati: II  TM Distance: >3 FB Neck ROM: Full    Dental  (+) Poor Dentition   Pulmonary    breath sounds clear to auscultation       Cardiovascular hypertension,  Rhythm:Regular Rate:Normal     Neuro/Psych    GI/Hepatic   Endo/Other    Renal/GU      Musculoskeletal   Abdominal   Peds  Hematology   Anesthesia Other Findings   Reproductive/Obstetrics                           Anesthesia Physical Anesthesia Plan  ASA: III  Anesthesia Plan: MAC   Post-op Pain Management:    Induction: Intravenous  PONV Risk Score and Plan:   Airway Management Planned: Natural Airway and Simple Face Mask  Additional Equipment:   Intra-op Plan:   Post-operative Plan:   Informed Consent: I have reviewed the patients History and Physical, chart, labs and discussed the procedure including the risks, benefits and alternatives for the proposed anesthesia with the patient or authorized representative who has indicated his/her understanding and acceptance.     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 He was last seen by Dr. Bronson Ing 11/06/17 and at that time was symptomatically stable. Per notes from Dr. Bronson Ing and Dr. Burt Knack, it was decided pt was not a candidate for TAVR due to medical comorbidities and the plan was for continued medical management. Per note, "Due to the patient's multiple comorbidities including marked weakness, nearly wheelchair-bound, multiple myeloma not tolerating therapy,  and long-standing end-stage renal disease on dialysis for at least 20 years, he was felt to be a poor candidate for TAVR.  Dr. Burt Knack felt that the potential for harm and putting him through the workup and ultimate surgery probably outweigh the potential benefit."  ESRD on HD MWF. Has been on HD for more than 20 years. 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 He currently has a right IJ tunneled catheter.  Discussed case with Drs. Hodierne and R. Fitzgerald. Okay to proceed as planned barring any acute status change. Will need DOS labs and eval.   EKG 08/06/18: Atrial fibrillation with slow ventricular response. Rate 58. Left axis deviation. Incomplete right bundle branch block. Septal infarct , age undetermined. Similar to prior.  TTE 10/30/17: - Left ventricle: The cavity size was normal. Wall thickness was  increased in a pattern of mild LVH. Systolic function was  vigorous. The estimated ejection fraction was in the range of 65%  to 70%. Wall motion was normal; there were no regional wall  motion abnormalities. The study is not technically sufficient to  allow evaluation of LV diastolic function in the setting of  atrial fibrillation/flutter.  - Aortic valve: Moderately calcified annulus. Trileaflet;  moderately calcified leaflets. Cusp separation was severely  reduced. There was severe stenosis. There was mild regurgitation.  Mean gradient (S): 17 mm Hg. Peak gradient (S): 37 mm Hg. Valve  area (VTI): 0.81 cm^2. Peak velocity ratio  of LVOT to aortic  valve: 0.3. Valve area (Vmax): 0.86 cm^2. Valve area (Vmean):  0.96 cm^2.  - Mitral valve: Moderately calcified annulus. Moderately calcified  leaflets . There was mild to moderate regurgitation directed  posteriorly.  - Left atrium: The atrium was severely dilated.  - Right atrium: The atrium was severely  dilated. Central venous  pressure (est): 3 mm Hg.  - Atrial septum: No defect or patent foramen ovale was identified.  - Tricuspid valve: There was mild regurgitation.  - Pulmonary arteries: PA peak pressure: 37 mm Hg (S).  - Pericardium, extracardiac: There was no pericardial effusion.  )      Anesthesia Quick Evaluation

## 2020-02-11 DIAGNOSIS — Z992 Dependence on renal dialysis: Secondary | ICD-10-CM | POA: Diagnosis not present

## 2020-02-11 DIAGNOSIS — D472 Monoclonal gammopathy: Secondary | ICD-10-CM | POA: Diagnosis not present

## 2020-02-11 DIAGNOSIS — D509 Iron deficiency anemia, unspecified: Secondary | ICD-10-CM | POA: Diagnosis not present

## 2020-02-11 DIAGNOSIS — D631 Anemia in chronic kidney disease: Secondary | ICD-10-CM | POA: Diagnosis not present

## 2020-02-11 DIAGNOSIS — N186 End stage renal disease: Secondary | ICD-10-CM | POA: Diagnosis not present

## 2020-02-11 DIAGNOSIS — N2581 Secondary hyperparathyroidism of renal origin: Secondary | ICD-10-CM | POA: Diagnosis not present

## 2020-02-14 DIAGNOSIS — D509 Iron deficiency anemia, unspecified: Secondary | ICD-10-CM | POA: Diagnosis not present

## 2020-02-14 DIAGNOSIS — Z992 Dependence on renal dialysis: Secondary | ICD-10-CM | POA: Diagnosis not present

## 2020-02-14 DIAGNOSIS — D472 Monoclonal gammopathy: Secondary | ICD-10-CM | POA: Diagnosis not present

## 2020-02-14 DIAGNOSIS — N2581 Secondary hyperparathyroidism of renal origin: Secondary | ICD-10-CM | POA: Diagnosis not present

## 2020-02-14 DIAGNOSIS — N186 End stage renal disease: Secondary | ICD-10-CM | POA: Diagnosis not present

## 2020-02-14 DIAGNOSIS — D631 Anemia in chronic kidney disease: Secondary | ICD-10-CM | POA: Diagnosis not present

## 2020-02-15 ENCOUNTER — Other Ambulatory Visit (HOSPITAL_COMMUNITY)
Admission: RE | Admit: 2020-02-15 | Discharge: 2020-02-15 | Disposition: A | Discharge status: Home / Self Care | Payer: Medicare Other | Source: Ambulatory Visit | Attending: Vascular Surgery | Admitting: Vascular Surgery

## 2020-02-15 DIAGNOSIS — R42 Dizziness and giddiness: Secondary | ICD-10-CM | POA: Diagnosis not present

## 2020-02-15 DIAGNOSIS — Z01812 Encounter for preprocedural laboratory examination: Secondary | ICD-10-CM | POA: Diagnosis not present

## 2020-02-15 DIAGNOSIS — Z20822 Contact with and (suspected) exposure to covid-19: Secondary | ICD-10-CM | POA: Diagnosis not present

## 2020-02-15 DIAGNOSIS — R519 Headache, unspecified: Secondary | ICD-10-CM | POA: Diagnosis not present

## 2020-02-15 LAB — SARS CORONAVIRUS 2 (TAT 6-24 HRS): SARS Coronavirus 2: NEGATIVE

## 2020-02-16 ENCOUNTER — Encounter (HOSPITAL_COMMUNITY): Payer: Self-pay | Admitting: Vascular Surgery

## 2020-02-16 ENCOUNTER — Other Ambulatory Visit: Payer: Self-pay

## 2020-02-16 NOTE — Progress Notes (Signed)
SDW-pre-op call completed by pt son, Marcello Moores [facility]). Son denies that pt C/O SOB and chest pain. Son stated that pt is under the care of Dr. Bronson Ing, Cardiology and Dr. [name], PCP. Son denies that pt had a stress test and cardiac cath. Son, denies hat pt had an EKG and chest x ray. Son made aware to have pt stop taking, vitamins, fish oil and herbal medications. Do not take any NSAIDs ie: Ibuprofen, Advil, Naproxen (Aleve), Motrin, BC and Goody Powder. Son made aware to have pt continue to quarantine. Son verbalized understanding of all pre-op instructions. See PA,  Anesthesiology, note.

## 2020-02-17 ENCOUNTER — Encounter (HOSPITAL_COMMUNITY): Payer: Self-pay | Admitting: Vascular Surgery

## 2020-02-17 ENCOUNTER — Ambulatory Visit (HOSPITAL_COMMUNITY)
Admission: RE | Admit: 2020-02-17 | Discharge: 2020-02-17 | Disposition: A | Discharge status: Home / Self Care | Payer: Medicare Other | Attending: Vascular Surgery | Admitting: Vascular Surgery

## 2020-02-17 ENCOUNTER — Ambulatory Visit (HOSPITAL_COMMUNITY): Payer: Medicare Other | Admitting: Physician Assistant

## 2020-02-17 ENCOUNTER — Other Ambulatory Visit: Payer: Self-pay

## 2020-02-17 ENCOUNTER — Encounter (HOSPITAL_COMMUNITY)
Admission: RE | Disposition: A | Discharge status: Home / Self Care | Payer: Self-pay | Source: Home / Self Care | Attending: Vascular Surgery

## 2020-02-17 DIAGNOSIS — I480 Paroxysmal atrial fibrillation: Secondary | ICD-10-CM | POA: Diagnosis not present

## 2020-02-17 DIAGNOSIS — Z79899 Other long term (current) drug therapy: Secondary | ICD-10-CM | POA: Diagnosis not present

## 2020-02-17 DIAGNOSIS — I63233 Cerebral infarction due to unspecified occlusion or stenosis of bilateral carotid arteries: Secondary | ICD-10-CM | POA: Diagnosis not present

## 2020-02-17 DIAGNOSIS — F17200 Nicotine dependence, unspecified, uncomplicated: Secondary | ICD-10-CM | POA: Diagnosis not present

## 2020-02-17 DIAGNOSIS — J3489 Other specified disorders of nose and nasal sinuses: Secondary | ICD-10-CM | POA: Diagnosis not present

## 2020-02-17 DIAGNOSIS — D61818 Other pancytopenia: Secondary | ICD-10-CM | POA: Diagnosis not present

## 2020-02-17 DIAGNOSIS — E859 Amyloidosis, unspecified: Secondary | ICD-10-CM | POA: Diagnosis not present

## 2020-02-17 DIAGNOSIS — I4821 Permanent atrial fibrillation: Secondary | ICD-10-CM | POA: Diagnosis not present

## 2020-02-17 DIAGNOSIS — D472 Monoclonal gammopathy: Secondary | ICD-10-CM | POA: Diagnosis not present

## 2020-02-17 DIAGNOSIS — R9431 Abnormal electrocardiogram [ECG] [EKG]: Secondary | ICD-10-CM | POA: Diagnosis not present

## 2020-02-17 DIAGNOSIS — E079 Disorder of thyroid, unspecified: Secondary | ICD-10-CM | POA: Diagnosis not present

## 2020-02-17 DIAGNOSIS — Z79890 Hormone replacement therapy (postmenopausal): Secondary | ICD-10-CM | POA: Diagnosis not present

## 2020-02-17 DIAGNOSIS — D631 Anemia in chronic kidney disease: Secondary | ICD-10-CM | POA: Diagnosis not present

## 2020-02-17 DIAGNOSIS — G8194 Hemiplegia, unspecified affecting left nondominant side: Secondary | ICD-10-CM | POA: Diagnosis not present

## 2020-02-17 DIAGNOSIS — Z7982 Long term (current) use of aspirin: Secondary | ICD-10-CM | POA: Diagnosis not present

## 2020-02-17 DIAGNOSIS — I959 Hypotension, unspecified: Secondary | ICD-10-CM | POA: Diagnosis not present

## 2020-02-17 DIAGNOSIS — Z992 Dependence on renal dialysis: Secondary | ICD-10-CM | POA: Diagnosis not present

## 2020-02-17 DIAGNOSIS — R471 Dysarthria and anarthria: Secondary | ICD-10-CM | POA: Diagnosis not present

## 2020-02-17 DIAGNOSIS — G8192 Hemiplegia, unspecified affecting left dominant side: Secondary | ICD-10-CM | POA: Diagnosis not present

## 2020-02-17 DIAGNOSIS — Z885 Allergy status to narcotic agent status: Secondary | ICD-10-CM | POA: Diagnosis not present

## 2020-02-17 DIAGNOSIS — J323 Chronic sphenoidal sinusitis: Secondary | ICD-10-CM | POA: Diagnosis not present

## 2020-02-17 DIAGNOSIS — I639 Cerebral infarction, unspecified: Secondary | ICD-10-CM | POA: Diagnosis not present

## 2020-02-17 DIAGNOSIS — K860 Alcohol-induced chronic pancreatitis: Secondary | ICD-10-CM | POA: Diagnosis not present

## 2020-02-17 DIAGNOSIS — I272 Pulmonary hypertension, unspecified: Secondary | ICD-10-CM | POA: Diagnosis not present

## 2020-02-17 DIAGNOSIS — I4891 Unspecified atrial fibrillation: Secondary | ICD-10-CM | POA: Diagnosis not present

## 2020-02-17 DIAGNOSIS — E7800 Pure hypercholesterolemia, unspecified: Secondary | ICD-10-CM | POA: Diagnosis not present

## 2020-02-17 DIAGNOSIS — Z8673 Personal history of transient ischemic attack (TIA), and cerebral infarction without residual deficits: Secondary | ICD-10-CM | POA: Diagnosis not present

## 2020-02-17 DIAGNOSIS — R1310 Dysphagia, unspecified: Secondary | ICD-10-CM | POA: Diagnosis not present

## 2020-02-17 DIAGNOSIS — E785 Hyperlipidemia, unspecified: Secondary | ICD-10-CM | POA: Diagnosis not present

## 2020-02-17 DIAGNOSIS — G9608 Other cranial cerebrospinal fluid leak: Secondary | ICD-10-CM | POA: Diagnosis not present

## 2020-02-17 DIAGNOSIS — R531 Weakness: Secondary | ICD-10-CM | POA: Diagnosis not present

## 2020-02-17 DIAGNOSIS — T17320A Food in larynx causing asphyxiation, initial encounter: Secondary | ICD-10-CM | POA: Diagnosis not present

## 2020-02-17 DIAGNOSIS — R0689 Other abnormalities of breathing: Secondary | ICD-10-CM | POA: Diagnosis not present

## 2020-02-17 DIAGNOSIS — N186 End stage renal disease: Secondary | ICD-10-CM | POA: Diagnosis not present

## 2020-02-17 DIAGNOSIS — H538 Other visual disturbances: Secondary | ICD-10-CM | POA: Diagnosis not present

## 2020-02-17 DIAGNOSIS — D181 Lymphangioma, any site: Secondary | ICD-10-CM | POA: Diagnosis not present

## 2020-02-17 DIAGNOSIS — R2689 Other abnormalities of gait and mobility: Secondary | ICD-10-CM | POA: Diagnosis not present

## 2020-02-17 DIAGNOSIS — I120 Hypertensive chronic kidney disease with stage 5 chronic kidney disease or end stage renal disease: Secondary | ICD-10-CM | POA: Insufficient documentation

## 2020-02-17 DIAGNOSIS — N185 Chronic kidney disease, stage 5: Secondary | ICD-10-CM | POA: Diagnosis not present

## 2020-02-17 DIAGNOSIS — I6200 Nontraumatic subdural hemorrhage, unspecified: Secondary | ICD-10-CM | POA: Diagnosis not present

## 2020-02-17 DIAGNOSIS — R4701 Aphasia: Secondary | ICD-10-CM | POA: Diagnosis not present

## 2020-02-17 DIAGNOSIS — R05 Cough: Secondary | ICD-10-CM | POA: Diagnosis not present

## 2020-02-17 DIAGNOSIS — I6389 Other cerebral infarction: Secondary | ICD-10-CM | POA: Diagnosis not present

## 2020-02-17 HISTORY — DX: Unspecified glaucoma: H40.9

## 2020-02-17 HISTORY — PX: AV FISTULA PLACEMENT: SHX1204

## 2020-02-17 LAB — POCT I-STAT, CHEM 8
BUN: 30 mg/dL — ABNORMAL HIGH (ref 8–23)
Calcium, Ion: 1.08 mmol/L — ABNORMAL LOW (ref 1.15–1.40)
Chloride: 97 mmol/L — ABNORMAL LOW (ref 98–111)
Creatinine, Ser: 8.8 mg/dL — ABNORMAL HIGH (ref 0.61–1.24)
Glucose, Bld: 84 mg/dL (ref 70–99)
HCT: 36 % — ABNORMAL LOW (ref 39.0–52.0)
Hemoglobin: 12.2 g/dL — ABNORMAL LOW (ref 13.0–17.0)
Potassium: 3.3 mmol/L — ABNORMAL LOW (ref 3.5–5.1)
Sodium: 140 mmol/L (ref 135–145)
TCO2: 25 mmol/L (ref 22–32)

## 2020-02-17 SURGERY — INSERTION OF ARTERIOVENOUS (AV) GORE-TEX GRAFT ARM
Anesthesia: Monitor Anesthesia Care | Site: Arm Upper | Laterality: Right

## 2020-02-17 MED ORDER — LIDOCAINE-EPINEPHRINE (PF) 1 %-1:200000 IJ SOLN
INTRAMUSCULAR | Status: AC
  Filled 2020-02-17: qty 30

## 2020-02-17 MED ORDER — CEFAZOLIN SODIUM-DEXTROSE 2-4 GM/100ML-% IV SOLN
2.0000 g | INTRAVENOUS | Status: AC
  Administered 2020-02-17: 2 g via INTRAVENOUS

## 2020-02-17 MED ORDER — HEPARIN SODIUM (PORCINE) 1000 UNIT/ML IJ SOLN
INTRAMUSCULAR | Status: DC | PRN
Start: 2020-02-17 — End: 2020-02-17
  Administered 2020-02-17: 5000 [IU] via INTRAVENOUS

## 2020-02-17 MED ORDER — PROTAMINE SULFATE 10 MG/ML IV SOLN
INTRAVENOUS | Status: AC
  Filled 2020-02-17: qty 5

## 2020-02-17 MED ORDER — OXYCODONE HCL 5 MG/5ML PO SOLN: 5.0000 mg | Freq: Once | ORAL | Status: DC | PRN

## 2020-02-17 MED ORDER — CHLORHEXIDINE GLUCONATE 0.12 % MT SOLN
OROMUCOSAL | Status: AC
  Administered 2020-02-17: 15 mL via OROMUCOSAL
  Filled 2020-02-17: qty 15

## 2020-02-17 MED ORDER — FENTANYL CITRATE (PF) 250 MCG/5ML IJ SOLN
INTRAMUSCULAR | Status: DC | PRN
  Administered 2020-02-17 (×2): 50 ug via INTRAVENOUS

## 2020-02-17 MED ORDER — FENTANYL CITRATE (PF) 250 MCG/5ML IJ SOLN
INTRAMUSCULAR | Status: AC
  Filled 2020-02-17: qty 5

## 2020-02-17 MED ORDER — LIDOCAINE HCL (PF) 1 % IJ SOLN
INTRAMUSCULAR | Status: AC
  Filled 2020-02-17: qty 30

## 2020-02-17 MED ORDER — OXYCODONE HCL 5 MG PO TABS: 5.0000 mg | ORAL_TABLET | Freq: Once | ORAL | Status: DC | PRN

## 2020-02-17 MED ORDER — SUCCINYLCHOLINE CHLORIDE 20 MG/ML IJ SOLN
INTRAMUSCULAR | Status: DC | PRN
  Administered 2020-02-17: 100 mg via INTRAVENOUS

## 2020-02-17 MED ORDER — PHENYLEPHRINE HCL-NACL 10-0.9 MG/250ML-% IV SOLN
INTRAVENOUS | Status: DC | PRN
  Administered 2020-02-17: 50 ug/min via INTRAVENOUS

## 2020-02-17 MED ORDER — SODIUM CHLORIDE 0.9 % IV SOLN
INTRAVENOUS | Status: AC
  Filled 2020-02-17: qty 1.2

## 2020-02-17 MED ORDER — MIDAZOLAM HCL 5 MG/5ML IJ SOLN
INTRAMUSCULAR | Status: DC | PRN
  Administered 2020-02-17: 1 mg via INTRAVENOUS

## 2020-02-17 MED ORDER — ETOMIDATE 2 MG/ML IV SOLN
INTRAVENOUS | Status: DC | PRN
  Administered 2020-02-17: 12 mg via INTRAVENOUS

## 2020-02-17 MED ORDER — ONDANSETRON HCL 4 MG/2ML IJ SOLN
INTRAMUSCULAR | Status: DC | PRN
  Administered 2020-02-17: 4 mg via INTRAVENOUS

## 2020-02-17 MED ORDER — CHLORHEXIDINE GLUCONATE 4 % EX LIQD: 60.0000 mL | Freq: Once | CUTANEOUS | Status: DC

## 2020-02-17 MED ORDER — CEFAZOLIN SODIUM-DEXTROSE 2-4 GM/100ML-% IV SOLN
INTRAVENOUS | Status: AC
  Filled 2020-02-17: qty 100

## 2020-02-17 MED ORDER — CHLORHEXIDINE GLUCONATE 0.12 % MT SOLN
15.0000 mL | OROMUCOSAL | Status: AC
  Filled 2020-02-17: qty 15

## 2020-02-17 MED ORDER — HEPARIN SODIUM (PORCINE) 1000 UNIT/ML IJ SOLN
INTRAMUSCULAR | Status: AC
  Filled 2020-02-17: qty 1

## 2020-02-17 MED ORDER — PROTAMINE SULFATE 10 MG/ML IV SOLN
INTRAVENOUS | Status: DC | PRN
Start: 2020-02-17 — End: 2020-02-17
  Administered 2020-02-17: 20 mg via INTRAVENOUS

## 2020-02-17 MED ORDER — MIDAZOLAM HCL 2 MG/2ML IJ SOLN
INTRAMUSCULAR | Status: AC
  Filled 2020-02-17: qty 2

## 2020-02-17 MED ORDER — SODIUM CHLORIDE 0.9 % IV SOLN
INTRAVENOUS | Status: DC | PRN
  Administered 2020-02-17: 500 mL

## 2020-02-17 MED ORDER — ONDANSETRON HCL 4 MG/2ML IJ SOLN: 4.0000 mg | Freq: Once | INTRAMUSCULAR | Status: DC | PRN

## 2020-02-17 MED ORDER — EPHEDRINE SULFATE 50 MG/ML IJ SOLN
INTRAMUSCULAR | Status: DC | PRN
  Administered 2020-02-17: 10 mg via INTRAVENOUS

## 2020-02-17 MED ORDER — PROPOFOL 10 MG/ML IV BOLUS
INTRAVENOUS | Status: AC
  Filled 2020-02-17: qty 20

## 2020-02-17 MED ORDER — OXYCODONE-ACETAMINOPHEN 5-325 MG PO TABS: 1.0000 | ORAL_TABLET | ORAL | 0 refills | Status: DC | PRN

## 2020-02-17 MED ORDER — ETOMIDATE 2 MG/ML IV SOLN
INTRAVENOUS | Status: AC
  Filled 2020-02-17: qty 10

## 2020-02-17 MED ORDER — DEXAMETHASONE SODIUM PHOSPHATE 10 MG/ML IJ SOLN
INTRAMUSCULAR | Status: DC | PRN
  Administered 2020-02-17: 10 mg via INTRAVENOUS

## 2020-02-17 MED ORDER — SODIUM CHLORIDE 0.9 % IV SOLN: INTRAVENOUS | Status: DC

## 2020-02-17 MED ORDER — 0.9 % SODIUM CHLORIDE (POUR BTL) OPTIME
TOPICAL | Status: DC | PRN
  Administered 2020-02-17: 1000 mL

## 2020-02-17 MED ORDER — FENTANYL CITRATE (PF) 100 MCG/2ML IJ SOLN: 25.0000 ug | INTRAMUSCULAR | Status: DC | PRN

## 2020-02-17 SURGICAL SUPPLY — 41 items
ARMBAND PINK RESTRICT EXTREMIT (MISCELLANEOUS) ×4 IMPLANT
BLADE CLIPPER SURG (BLADE) ×2 IMPLANT
CANISTER SUCT 3000ML PPV (MISCELLANEOUS) ×2 IMPLANT
CLIP VESOCCLUDE MED 6/CT (CLIP) ×4 IMPLANT
CLIP VESOCCLUDE SM WIDE 6/CT (CLIP) ×2 IMPLANT
COVER PROBE W GEL 5X96 (DRAPES) ×2 IMPLANT
COVER WAND RF STERILE (DRAPES) IMPLANT
DECANTER SPIKE VIAL GLASS SM (MISCELLANEOUS) ×2 IMPLANT
DERMABOND ADVANCED (GAUZE/BANDAGES/DRESSINGS) ×1
DERMABOND ADVANCED .7 DNX12 (GAUZE/BANDAGES/DRESSINGS) ×1 IMPLANT
ELECT REM PT RETURN 9FT ADLT (ELECTROSURGICAL) ×4
ELECTRODE REM PT RTRN 9FT ADLT (ELECTROSURGICAL) ×2 IMPLANT
GLOVE BIO SURGEON STRL SZ 6.5 (GLOVE) ×2 IMPLANT
GLOVE BIO SURGEON STRL SZ7.5 (GLOVE) ×2 IMPLANT
GLOVE BIOGEL PI IND STRL 6.5 (GLOVE) ×2 IMPLANT
GLOVE BIOGEL PI IND STRL 7.0 (GLOVE) ×1 IMPLANT
GLOVE BIOGEL PI IND STRL 8 (GLOVE) ×1 IMPLANT
GLOVE BIOGEL PI INDICATOR 6.5 (GLOVE) ×2
GLOVE BIOGEL PI INDICATOR 7.0 (GLOVE) ×1
GLOVE BIOGEL PI INDICATOR 8 (GLOVE) ×1
GOWN STRL REUS W/ TWL LRG LVL3 (GOWN DISPOSABLE) ×2 IMPLANT
GOWN STRL REUS W/ TWL XL LVL3 (GOWN DISPOSABLE) ×2 IMPLANT
GOWN STRL REUS W/TWL LRG LVL3 (GOWN DISPOSABLE) ×4
GOWN STRL REUS W/TWL XL LVL3 (GOWN DISPOSABLE) ×4
GRAFT GORETEX STRT 4-7X45 (Vascular Products) ×2 IMPLANT
HEMOSTAT SPONGE AVITENE ULTRA (HEMOSTASIS) IMPLANT
KIT BASIN OR (CUSTOM PROCEDURE TRAY) ×2 IMPLANT
KIT TURNOVER KIT B (KITS) ×2 IMPLANT
NS IRRIG 1000ML POUR BTL (IV SOLUTION) ×2 IMPLANT
PACK CV ACCESS (CUSTOM PROCEDURE TRAY) ×2 IMPLANT
PAD ARMBOARD 7.5X6 YLW CONV (MISCELLANEOUS) ×4 IMPLANT
SUT GORETEX 6.0 TT13 (SUTURE) IMPLANT
SUT MNCRL AB 4-0 PS2 18 (SUTURE) ×4 IMPLANT
SUT PROLENE 6 0 BV (SUTURE) ×10 IMPLANT
SUT PROLENE 7 0 BV 1 (SUTURE) IMPLANT
SUT SILK 2 0 PERMA HAND 18 BK (SUTURE) IMPLANT
SUT VIC AB 3-0 SH 27 (SUTURE) ×4
SUT VIC AB 3-0 SH 27X BRD (SUTURE) ×2 IMPLANT
TOWEL GREEN STERILE (TOWEL DISPOSABLE) ×2 IMPLANT
UNDERPAD 30X36 HEAVY ABSORB (UNDERPADS AND DIAPERS) ×2 IMPLANT
WATER STERILE IRR 1000ML POUR (IV SOLUTION) ×2 IMPLANT

## 2020-02-17 NOTE — Anesthesia Procedure Notes (Addendum)
Procedure Name: Intubation Date/Time: 02/17/2020 12:46 PM Performed by: Mariea Clonts, CRNA Pre-anesthesia Checklist: Patient identified, Emergency Drugs available, Suction available and Patient being monitored Patient Re-evaluated:Patient Re-evaluated prior to induction Oxygen Delivery Method: Circle System Utilized Preoxygenation: Pre-oxygenation with 100% oxygen Induction Type: IV induction Ventilation: Mask ventilation without difficulty Laryngoscope Size: Glidescope and 4 Grade View: Grade I Tube type: Oral Tube size: 7.5 mm Number of attempts: 3 Airway Equipment and Method: Stylet and Oral airway Placement Confirmation: ETT inserted through vocal cords under direct vision,  positive ETCO2 and breath sounds checked- equal and bilateral Tube secured with: Tape Dental Injury: Teeth and Oropharynx as per pre-operative assessment  Difficulty Due To: Difficulty was unanticipated, Difficult Airway- due to anterior larynx and Difficult Airway- due to dentition Future Recommendations: Recommend- induction with short-acting agent, and alternative techniques readily available Comments: Poor visualization with mac 3 and miller 2

## 2020-02-17 NOTE — Transfer of Care (Signed)
Immediate Anesthesia Transfer of Care Note  Patient: Brian Liu  Procedure(s) Performed: INSERTION OF RIGHT ARM  ARTERIOVENOUS (AV)  GRAFT (Right Arm Upper)  Patient Location: PACU  Anesthesia Type:General  Level of Consciousness: awake, alert  and oriented  Airway & Oxygen Therapy: Patient Spontanous Breathing and Patient connected to face mask oxygen  Post-op Assessment: Report given to RN, Post -op Vital signs reviewed and stable and Patient moving all extremities X 4  Post vital signs: Reviewed and stable  Last Vitals:  Vitals Value Taken Time  BP 122/72 02/17/20 1450  Temp    Pulse 64 02/17/20 1450  Resp 12 02/17/20 1457  SpO2    Vitals shown include unvalidated device data.  Last Pain:  Vitals:   02/17/20 1450  TempSrc:   PainSc: 0-No pain      Patients Stated Pain Goal: 0 (58/48/35 0757)  Complications: No complications documented.

## 2020-02-17 NOTE — Discharge Instructions (Signed)
[facility]  Discharge Instructions  AV Fistula or Graft Surgery for Dialysis Access  Please refer to the following instructions for your post-procedure care. Your surgeon or physician assistant will discuss any changes with you.  Activity  You may drive the day following your surgery, if you are comfortable and no longer taking prescription pain medication. Resume full activity as the soreness in your incision resolves.  Bathing/Showering  You may shower after you go home. Keep your incision dry for 48 hours. Do not soak in a bathtub, hot tub, or swim until the incision heals completely. You may not shower if you have a hemodialysis catheter.  Incision Care  Clean your incision with mild soap and water after 48 hours. Pat the area dry with a clean towel. You do not need a bandage unless otherwise instructed. Do not apply any ointments or creams to your incision. You may have skin glue on your incision. Do not peel it off. It will come off on its own in about one week. Your arm may swell a bit after surgery. To reduce swelling use pillows to elevate your arm so it is above your heart. Your doctor will tell you if you need to lightly wrap your arm with an ACE bandage.  Diet  Resume your normal diet. There are not special food restrictions following this procedure. In order to heal from your surgery, it is CRITICAL to get adequate nutrition. Your body requires vitamins, minerals, and protein. Vegetables are the best source of vitamins and minerals. Vegetables also provide the perfect balance of protein. Processed food has little nutritional value, so try to avoid this.  Medications  Resume taking all of your medications. If your incision is causing pain, you may take over-the counter pain relievers such as acetaminophen (Tylenol). If you were prescribed a stronger pain medication, please be aware these medications can cause nausea and constipation. Prevent  nausea by taking the medication with a snack or meal. Avoid constipation by drinking plenty of fluids and eating foods with high amount of fiber, such as fruits, vegetables, and grains.  Do not take Tylenol if you are taking prescription pain medications.  Follow up Your surgeon may want to see you in the office following your access surgery. If so, this will be arranged at the time of your surgery.  Please call us immediately for any of the following conditions:  . Increased pain, redness, drainage (pus) from your incision site . Fever of 101 degrees or higher . Severe or worsening pain at your incision site . Hand pain or numbness. .  Reduce your risk of vascular disease:  . Stop smoking. If you would like help, call [facility] at 1-800-QUIT-NOW 7246769910) or [place] at [phone]  . Manage your cholesterol . Maintain a desired weight . Control your diabetes . Keep your blood pressure down  Dialysis  It will take several weeks to several months for your new dialysis access to be ready for use. Your surgeon will determine when it is okay to use it. Your nephrologist will continue to direct your dialysis. You can continue to use your Permcath until your new access is ready for use.   02/17/2020 Brian Liu [id number] [birth year]  Surgeon(s): [name], MD  Procedure(s): INSERTION OF RIGHT ARM  ARTERIOVENOUS (AV)  GRAFT   May stick graft immediately   May stick graft on designated area only:   X Do not stick right AV graft for 4  weeks    If you have any questions, please call the office at [phone].

## 2020-02-17 NOTE — Anesthesia Postprocedure Evaluation (Signed)
Anesthesia Post Note  Patient: Brian Liu  Procedure(s) Performed: INSERTION OF RIGHT ARM  ARTERIOVENOUS (AV)  GRAFT (Right Arm Upper)     Patient location during evaluation: PACU Anesthesia Type: MAC Level of consciousness: awake and alert Pain management: pain level controlled Vital Signs Assessment: post-procedure vital signs reviewed and stable Respiratory status: spontaneous breathing, nonlabored ventilation, respiratory function stable and patient connected to nasal cannula oxygen Cardiovascular status: blood pressure returned to baseline and stable Postop Assessment: no apparent nausea or vomiting Anesthetic complications: no   No complications documented.  Last Vitals:  Vitals:   02/17/20 1505 02/17/20 1520  BP: 109/75 107/68  Pulse: 66 65  Resp: 15 12  Temp:  (!) 36.2 C  SpO2: 100% 98%    Last Pain:  Vitals:   02/17/20 1520  TempSrc:   PainSc: 0-No pain                 Brynnly Bonet COKER

## 2020-02-17 NOTE — H&P (Signed)
History and Physical Interval Note:  02/17/2020 11:40 AM  Brian Liu  has presented today for surgery, with the diagnosis of END STAGE RENAL DISEASE.  The various methods of treatment have been discussed with the patient and family. After consideration of risks, benefits and other options for treatment, the patient has consented to  Procedure(s): INSERTION OF RIGHT ARM  ARTERIOVENOUS (AV)  GRAFT (Right) as a surgical intervention.  The patient's history has been reviewed, patient examined, no change in status, stable for surgery.  I have reviewed the patient's chart and labs.  Questions were answered to the patient's satisfaction.    Right arm AVF vs graft - likely graft.  Brian Liu  Patient name: Brian Liu       MRN: [id number]        DOB: July 22, 1951            Sex: male  REASON FOR VISIT: Evaluate for new permanent dialysis access  HPI: Brian Liu is a 69 y.o. male with multiple medical comorbidities including atrial fibrillation, end-stage renal disease on hemodialysis that presents for evaluation of new access.  He has a left radiocephalic AV fistula that was placed 23 years ago.  That is now thrombosed.  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  Patient states he is left-handed.  He is on dialysis Monday Wednesday Friday.  Not on any blood thinners.  Here with his son today.      Past Medical History:  Diagnosis Date  . Atrial fibrillation (HCC)    Permanent, Not on Coumadin because of history of lower GI bleed  . Diastolic dysfunction    Restrictive  diastolic filling pattern echo,  February, 2013  . Dyslipidemia   . Ejection fraction    EF 60-65%, echo, 2013  . ESRD (end stage renal disease) ([place])    Dialysis, fistula in left arm  . Hypertension   . Lower gastrointestinal bleed   . LVH (left ventricular hypertrophy)   . MGUS (monoclonal gammopathy of  unknown significance) 04/30/2016  . Mitral regurgitation    Mild by echo, 2013  . Multiple myeloma ([place]) 04/30/2016  . Pancytopenia ([place])    Dr Brian Liu  . Pulmonary hypertension (HCC)    Severe, PA pressure 65-70 mm of mercury, echo, 2013  . Tobacco abuse          Past Surgical History:  Procedure Laterality Date  . A/V FISTULAGRAM Left 04/03/2017   Procedure: A/V Fistulagram;  Surgeon: Brian Elkader, MD;  Location: [facility] [facility];  Service: Cardiovascular;  Laterality: Left;  . A/V FISTULAGRAM Left 08/06/2018   Procedure: A/V FISTULAGRAM;  Surgeon: Brian Heck, MD;  Location: [facility];  Service: Cardiovascular;  Laterality: Left;  . AV FISTULA PLACEMENT Left 1992   Left arm   . IR FLUORO GUIDE CV LINE RIGHT  06/23/2018  . IR FLUORO GUIDE CV LINE RIGHT  12/28/2019  . IR REMOVAL TUN CV CATH W/O FL  08/20/2018  . IR US GUIDE VASC ACCESS RIGHT  06/23/2018  . IR US GUIDE VASC ACCESS RIGHT  12/28/2019  . 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 PERIPHERAL VASCULAR BALLOON ANGIOPLASTY Left 08/06/2018   Procedure: PERIPHERAL VASCULAR BALLOON ANGIOPLASTY;  Surgeon: Brian Heck, MD;  Location: [place] [facility];  Service: Cardiovascular;  Laterality: Left;         Family History  Problem Relation Age of Onset  . Other Mother        Deceased, 46  . Stomach cancer Father        Deceased, 30  . Kidney failure Son   . Healthy Son     SOCIAL HISTORY: Social History        Tobacco Use  . Smoking status: Never Smoker  . Smokeless tobacco: Never Used  Substance Use Topics  . Alcohol use: No    Alcohol/week: 0.0 standard drinks          Allergies  Allergen Reactions  . Other     Hydrocodone cough syrup makes him itch          Current Outpatient Medications  Medication Sig Dispense Refill  . cinacalcet (SENSIPAR) 30 MG tablet Take 30 mg by mouth at bedtime.     . dorzolamide-timolol (COSOPT) 22.3-6.8 MG/ML ophthalmic solution Place 1 drop into both eyes 2 (two) times daily.     . ferric citrate (AURYXIA) 1 GM 210 MG(Fe) tablet Take 210 mg by mouth 3 (three) times daily with meals.    . gabapentin (NEURONTIN) 100 MG capsule Take 200 mg by mouth at bedtime.   6  . midodrine (PROAMATINE) 10 MG tablet Take 10 mg by mouth daily.     . mirtazapine (REMERON) 7.5 MG tablet Take 7.5 mg by mouth at bedtime.    . multivitamin (RENA-VIT) TABS tablet Take 1 tablet by mouth daily.     No current facility-administered medications for this visit.    REVIEW OF SYSTEMS:  '[X]'  denotes positive finding, '[ ]'  denotes negative finding Cardiac  Comments:  Chest pain or chest pressure:    Shortness of breath upon exertion:    Short of breath when lying flat:    Irregular heart rhythm:        Vascular    Pain in calf, thigh, or hip brought on by ambulation:    Pain in feet at night that wakes you up from your sleep:     Blood clot in your veins:    Leg swelling:         Pulmonary    Oxygen at home:    Productive cough:     Wheezing:         Neurologic    Sudden weakness in arms or legs:     Sudden numbness in arms or legs:     Sudden onset of difficulty speaking or slurred speech:    Temporary loss of vision in one eye:     Problems with dizziness:         Gastrointestinal    Blood in stool:     Vomited blood:         Genitourinary    Burning when urinating:     Blood in urine:        Psychiatric    Major depression:         Hematologic    Bleeding problems:    Problems with blood clotting too easily:         Skin    Rashes or ulcers:        Constitutional    Fever or chills:  PHYSICAL EXAM: There were no vitals filed for this visit.  GENERAL: The patient is a well-nourished male, in no acute distress. The vital signs are documented above. CARDIAC: There is a regular rate and rhythm.  VASCULAR:  Left radiocephalic fistula with no thrill and 2 large tandem aneurysms, no left radial pulse Right radial and brachial pulse 1+ palpable Rigjt IJ tunneled catheter PULMONARY: There is good air exchange bilaterally without wheezing or rales. ABDOMEN: Soft and non-tender with normal pitched bowel sounds.  MUSCULOSKELETAL: There are no major deformities or cyanosis. SKIN: There are no ulcers or rashes noted. PSYCHIATRIC: The patient has a normal affect.  DATA:   I independently reviewed his vein mapping and he has no usable superficial vein in the right arm.    Assessment/Plan:  69 year old male with end-stage renal disease presents for permanent hemodialysis access.  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  I discussed with him and his son that given dampened monophasic flow on his left upper extremity arterial duplex suggesting significant inflow disease as well as a central stenosis that will limit outflow, I would favor avoiding the left arm at this time.  I favor going to his right arm and discussed no good surface veins but would reevaluate with ultrasound in the OR and likely plan for right upper arm AV graft.  He currently has a right IJ tunneled catheter that was placed several weeks ago.  States this is being exchanged on Thursday.  Will schedule right arm AVF vs graft on non-dialysis day.   Brian Heck, MD [facility] [place] Office: [place]

## 2020-02-17 NOTE — Op Note (Addendum)
OPERATIVE NOTE   PROCEDURE:  right upper arm arteriovenous graft (4 mm x 7 mm taper) with banding of the upper arm 7 mm portion of graft    PRE-OPERATIVE DIAGNOSIS: end stage renal disease   POST-OPERATIVE DIAGNOSIS: same  SURGEON: [name], MD  ASSISTANT(S): Risa Grill, PA  ANESTHESIA: general  ESTIMATED BLOOD LOSS: <75 mL  FINDING(S): 1. Palpable thrill at end of case 2. Dopplerable radial signal at end of case.  SPECIMEN(S):  None  INDICATIONS:   Brian Liu is a 69 y.o. male who presents with ESRD and need for permanent hemodialysis access.  Risk, benefits, and alternatives to access surgery were discussed.  The patient is aware the risks include but are not limited to: bleeding, infection, steal syndrome, nerve damage, ischemic monomelic neuropathy, failure to mature, and need for additional procedures.  The patient is aware of the risks and elects to proceed forward.  An assistant was needed for exposure and to expedite the case.   DESCRIPTION: After full informed written consent was obtained from the patient, the patient was brought back to the operating room and placed supine upon the operating table.  The patient was given IV antibiotics prior to proceeding.  After obtaining adequate sedation, the patient was prepped and draped in standard fashion for a right arm access procedure.  I evaluated all of the surface veins with ultrasound and they were small as indicated on pre-operative vein mapping.  I turned my attention first to the antecubitum.  Under ultrasound guidance, I identified the location of the brachial artery and marked it on the skin.  I then looked on the upper arm near the axilla and marked the brachial vein as well as axillary vein.  I made a longitudinal incision over the brachial artery above the antecubitum and another longitudinal incision over the brachial vein near the axillary vein.  I dissected down through the subcutaneous tissue  and  fascia carefully and was able to dissect out the brachial artery.  The artery was about 3.5 mm externally.  It was controlled proximally and distally with vessel loops.  I then dissected out the larger brachial vein through the upper arm incision.  Externally, it appeared to be 5 mm in diameter.  I then dissected this vein proximally and distal.  I took a metal Gore tunneler and dissected from the antecubital incision to the axillary incision.  Then I delivered the 4 x 7-mm stretch Gore-Tex graft, through this metal tunneler and then pulled out the metal tunneler leaving the graft in place.  The 4-mm end was left on the brachial artery side and the 7-mm end toward the vein side.  I then gave the patient 5,000 units of heparin to gain anticoagulation.  After waiting 2 minutes, I placed the brachial artery under tension proximally and distally with vessel loops, made an arteriotomy and extended it with a Potts scissor.  I sewed the 4-mm end of the graft to this arteriotomy with a running stitch of 6-0 Prolene.  At this point, then I completed the anastomosis in the usual fashion.  I released the vessel loops on the inflow and allowed the artery to decompress through the graft. There was good pulsatile bleeding through this graft.  I clamped the graft near its arterial anastomosis and sucked out all the blood in the graft and loaded the graft with heparinized saline.  At this point, I pulled the graft to appropriate length and reset my exposure of  the high brachial vein. The vein was controlled with Henle clamps and opened with 11 blade scalpel and extended with Potts scissors.  There was good venous backbleeding from the vein. I spatulated the graft to facilitate an end-to-side anastomosis.  In the process of spatulating, I cut the graft to appropriate length for this anastomosis.  This graft was sewn to the vein in an end-to-side configuration with a 6-0 Prolene.  Prior to completing this anastomosis, I allowed  the vein to back bleed and then I also allowed the artery to bleed in an antegrade fashion.  I completed this anastomosis in the usual fashion.  I irrigated out both incisions.   The graft had a good palpable thrill.    I could not get any signals at the wrist.  When I clamped the graft subsequently got radial and ulnar monophasic signals.  When I partially clamped the graft also had signals at the wrist.  Ultimately I elected to try banding and took some 2-0 silk ties and loosely tied around the graft on the venous anasomosis side and ultimately still had a good thrill in the graft as well as signals at the wrist.   The subcutaneous tissue in each incision was reapproximated with a running stitch of 3-0 Vicryl.  The skin was then reapproximated with a running subcuticular 4-0 Monocryl.  The skin was then cleaned, dried, and Dermabond used to reinforce the skin closure.   COMPLICATIONS: None  CONDITION: Stable   [name], MD [facility]: [phone]   02/17/2020, 2:15 PM

## 2020-02-17 NOTE — Progress Notes (Signed)
Patient resting comfortably waiting on IV team to deaccess dialysis catheter. No need for further monitoring.

## 2020-02-17 NOTE — Anesthesia Procedure Notes (Addendum)
Procedure Name: LMA Insertion Date/Time: 02/17/2020 12:31 PM Performed by: Mariea Clonts, CRNA Pre-anesthesia Checklist: Patient identified, Emergency Drugs available, Suction available and Patient being monitored Patient Re-evaluated:Patient Re-evaluated prior to induction Oxygen Delivery Method: Circle System Utilized Preoxygenation: Pre-oxygenation with 100% oxygen Induction Type: IV induction Ventilation: Mask ventilation without difficulty LMA: LMA inserted LMA Size: 4.0 Number of attempts: 1 Airway Equipment and Method: Bite block Placement Confirmation: positive ETCO2 Tube secured with: Tape Dental Injury: Teeth and Oropharynx as per pre-operative assessment  Comments: Could not consistently get adequate air exchange with an lma.

## 2020-02-17 NOTE — Progress Notes (Signed)
Found patient with bed bugs on him and his clothes. Informed patient of findings. Patient stated that he was told on Monday at dialysis that they had found bed bugs there. Patient's clothes removed and double bagged per policy. Wiped patient down with CHG clothes and placed in clean gown.

## 2020-02-17 NOTE — Progress Notes (Signed)
Shared with son during discharge instructions about patient having bed bugs. Patient's son will assist patient in the cleaning of the house including all bed linens and clothing.

## 2020-02-18 ENCOUNTER — Encounter (HOSPITAL_COMMUNITY): Payer: Self-pay | Admitting: Vascular Surgery

## 2020-02-18 DIAGNOSIS — R471 Dysarthria and anarthria: Secondary | ICD-10-CM | POA: Diagnosis not present

## 2020-02-18 DIAGNOSIS — G8194 Hemiplegia, unspecified affecting left nondominant side: Secondary | ICD-10-CM | POA: Diagnosis not present

## 2020-02-18 DIAGNOSIS — R1312 Dysphagia, oropharyngeal phase: Secondary | ICD-10-CM | POA: Diagnosis not present

## 2020-02-18 DIAGNOSIS — F17200 Nicotine dependence, unspecified, uncomplicated: Secondary | ICD-10-CM | POA: Diagnosis not present

## 2020-02-18 DIAGNOSIS — I6200 Nontraumatic subdural hemorrhage, unspecified: Secondary | ICD-10-CM | POA: Diagnosis not present

## 2020-02-18 DIAGNOSIS — E213 Hyperparathyroidism, unspecified: Secondary | ICD-10-CM | POA: Diagnosis not present

## 2020-02-18 DIAGNOSIS — T17320A Food in larynx causing asphyxiation, initial encounter: Secondary | ICD-10-CM | POA: Diagnosis not present

## 2020-02-18 DIAGNOSIS — I10 Essential (primary) hypertension: Secondary | ICD-10-CM | POA: Diagnosis not present

## 2020-02-18 DIAGNOSIS — I63233 Cerebral infarction due to unspecified occlusion or stenosis of bilateral carotid arteries: Secondary | ICD-10-CM | POA: Diagnosis not present

## 2020-02-18 DIAGNOSIS — I083 Combined rheumatic disorders of mitral, aortic and tricuspid valves: Secondary | ICD-10-CM | POA: Diagnosis not present

## 2020-02-18 DIAGNOSIS — N186 End stage renal disease: Secondary | ICD-10-CM | POA: Diagnosis not present

## 2020-02-18 DIAGNOSIS — D61818 Other pancytopenia: Secondary | ICD-10-CM | POA: Diagnosis not present

## 2020-02-18 DIAGNOSIS — R1313 Dysphagia, pharyngeal phase: Secondary | ICD-10-CM | POA: Diagnosis not present

## 2020-02-18 DIAGNOSIS — R1310 Dysphagia, unspecified: Secondary | ICD-10-CM | POA: Diagnosis not present

## 2020-02-18 DIAGNOSIS — I639 Cerebral infarction, unspecified: Secondary | ICD-10-CM | POA: Diagnosis not present

## 2020-02-18 DIAGNOSIS — R05 Cough: Secondary | ICD-10-CM | POA: Diagnosis not present

## 2020-02-18 DIAGNOSIS — G8192 Hemiplegia, unspecified affecting left dominant side: Secondary | ICD-10-CM | POA: Diagnosis not present

## 2020-02-18 DIAGNOSIS — Z8521 Personal history of malignant neoplasm of larynx: Secondary | ICD-10-CM | POA: Diagnosis not present

## 2020-02-18 DIAGNOSIS — R531 Weakness: Secondary | ICD-10-CM | POA: Diagnosis not present

## 2020-02-19 DIAGNOSIS — Z992 Dependence on renal dialysis: Secondary | ICD-10-CM | POA: Diagnosis not present

## 2020-02-19 DIAGNOSIS — I120 Hypertensive chronic kidney disease with stage 5 chronic kidney disease or end stage renal disease: Secondary | ICD-10-CM | POA: Diagnosis not present

## 2020-02-19 DIAGNOSIS — R1310 Dysphagia, unspecified: Secondary | ICD-10-CM | POA: Diagnosis not present

## 2020-02-19 DIAGNOSIS — I639 Cerebral infarction, unspecified: Secondary | ICD-10-CM | POA: Diagnosis not present

## 2020-02-19 DIAGNOSIS — R1313 Dysphagia, pharyngeal phase: Secondary | ICD-10-CM | POA: Diagnosis not present

## 2020-02-19 DIAGNOSIS — R531 Weakness: Secondary | ICD-10-CM | POA: Diagnosis not present

## 2020-02-19 DIAGNOSIS — I480 Paroxysmal atrial fibrillation: Secondary | ICD-10-CM | POA: Diagnosis not present

## 2020-02-19 DIAGNOSIS — N186 End stage renal disease: Secondary | ICD-10-CM | POA: Diagnosis not present

## 2020-02-19 DIAGNOSIS — D696 Thrombocytopenia, unspecified: Secondary | ICD-10-CM | POA: Diagnosis not present

## 2020-02-19 DIAGNOSIS — D61818 Other pancytopenia: Secondary | ICD-10-CM | POA: Diagnosis not present

## 2020-02-19 DIAGNOSIS — F17200 Nicotine dependence, unspecified, uncomplicated: Secondary | ICD-10-CM | POA: Diagnosis not present

## 2020-02-19 DIAGNOSIS — I10 Essential (primary) hypertension: Secondary | ICD-10-CM | POA: Diagnosis not present

## 2020-02-19 DIAGNOSIS — R1312 Dysphagia, oropharyngeal phase: Secondary | ICD-10-CM | POA: Diagnosis not present

## 2020-02-19 DIAGNOSIS — E7800 Pure hypercholesterolemia, unspecified: Secondary | ICD-10-CM | POA: Diagnosis not present

## 2020-02-19 DIAGNOSIS — G8194 Hemiplegia, unspecified affecting left nondominant side: Secondary | ICD-10-CM | POA: Diagnosis not present

## 2020-02-19 DIAGNOSIS — R471 Dysarthria and anarthria: Secondary | ICD-10-CM | POA: Diagnosis not present

## 2020-02-20 DIAGNOSIS — F17200 Nicotine dependence, unspecified, uncomplicated: Secondary | ICD-10-CM | POA: Diagnosis not present

## 2020-02-20 DIAGNOSIS — E213 Hyperparathyroidism, unspecified: Secondary | ICD-10-CM | POA: Diagnosis not present

## 2020-02-20 DIAGNOSIS — D61818 Other pancytopenia: Secondary | ICD-10-CM | POA: Diagnosis not present

## 2020-02-20 DIAGNOSIS — R531 Weakness: Secondary | ICD-10-CM | POA: Diagnosis not present

## 2020-02-20 DIAGNOSIS — G9608 Other cranial cerebrospinal fluid leak: Secondary | ICD-10-CM | POA: Diagnosis not present

## 2020-02-20 DIAGNOSIS — I480 Paroxysmal atrial fibrillation: Secondary | ICD-10-CM | POA: Diagnosis not present

## 2020-02-20 DIAGNOSIS — D472 Monoclonal gammopathy: Secondary | ICD-10-CM | POA: Diagnosis not present

## 2020-02-20 DIAGNOSIS — I639 Cerebral infarction, unspecified: Secondary | ICD-10-CM | POA: Diagnosis not present

## 2020-02-20 DIAGNOSIS — I10 Essential (primary) hypertension: Secondary | ICD-10-CM | POA: Diagnosis not present

## 2020-02-20 DIAGNOSIS — N186 End stage renal disease: Secondary | ICD-10-CM | POA: Diagnosis not present

## 2020-02-20 DIAGNOSIS — K860 Alcohol-induced chronic pancreatitis: Secondary | ICD-10-CM | POA: Diagnosis not present

## 2020-02-20 DIAGNOSIS — G8194 Hemiplegia, unspecified affecting left nondominant side: Secondary | ICD-10-CM | POA: Diagnosis not present

## 2020-02-20 DIAGNOSIS — E7800 Pure hypercholesterolemia, unspecified: Secondary | ICD-10-CM | POA: Diagnosis not present

## 2020-02-20 DIAGNOSIS — R1310 Dysphagia, unspecified: Secondary | ICD-10-CM | POA: Diagnosis not present

## 2020-02-20 DIAGNOSIS — Z720 Tobacco use: Secondary | ICD-10-CM | POA: Diagnosis not present

## 2020-02-20 DIAGNOSIS — R471 Dysarthria and anarthria: Secondary | ICD-10-CM | POA: Diagnosis not present

## 2020-02-22 ENCOUNTER — Other Ambulatory Visit: Payer: Self-pay

## 2020-02-22 ENCOUNTER — Encounter (HOSPITAL_COMMUNITY): Payer: Self-pay | Admitting: *Deleted

## 2020-02-22 ENCOUNTER — Inpatient Hospital Stay (HOSPITAL_COMMUNITY)
Admission: EM | Admit: 2020-02-22 | Discharge: 2020-02-26 | DRG: 391 | Disposition: A | Discharge status: Home / Self Care | Payer: Medicare Other | Attending: Family Medicine | Admitting: Family Medicine

## 2020-02-22 DIAGNOSIS — I1311 Hypertensive heart and chronic kidney disease without heart failure, with stage 5 chronic kidney disease, or end stage renal disease: Secondary | ICD-10-CM | POA: Diagnosis not present

## 2020-02-22 DIAGNOSIS — Z87891 Personal history of nicotine dependence: Secondary | ICD-10-CM

## 2020-02-22 DIAGNOSIS — U071 COVID-19: Secondary | ICD-10-CM | POA: Diagnosis not present

## 2020-02-22 DIAGNOSIS — I4821 Permanent atrial fibrillation: Secondary | ICD-10-CM | POA: Diagnosis not present

## 2020-02-22 DIAGNOSIS — M898X9 Other specified disorders of bone, unspecified site: Secondary | ICD-10-CM | POA: Diagnosis present

## 2020-02-22 DIAGNOSIS — N2581 Secondary hyperparathyroidism of renal origin: Secondary | ICD-10-CM | POA: Diagnosis present

## 2020-02-22 DIAGNOSIS — R1310 Dysphagia, unspecified: Secondary | ICD-10-CM

## 2020-02-22 DIAGNOSIS — C9000 Multiple myeloma not having achieved remission: Secondary | ICD-10-CM | POA: Diagnosis present

## 2020-02-22 DIAGNOSIS — I4891 Unspecified atrial fibrillation: Secondary | ICD-10-CM | POA: Diagnosis present

## 2020-02-22 DIAGNOSIS — N186 End stage renal disease: Secondary | ICD-10-CM | POA: Diagnosis not present

## 2020-02-22 DIAGNOSIS — D472 Monoclonal gammopathy: Secondary | ICD-10-CM | POA: Diagnosis present

## 2020-02-22 DIAGNOSIS — I10 Essential (primary) hypertension: Secondary | ICD-10-CM | POA: Diagnosis present

## 2020-02-22 DIAGNOSIS — I272 Pulmonary hypertension, unspecified: Secondary | ICD-10-CM | POA: Diagnosis present

## 2020-02-22 DIAGNOSIS — R1312 Dysphagia, oropharyngeal phase: Principal | ICD-10-CM | POA: Diagnosis present

## 2020-02-22 DIAGNOSIS — Z992 Dependence on renal dialysis: Secondary | ICD-10-CM

## 2020-02-22 DIAGNOSIS — T8612 Kidney transplant failure: Secondary | ICD-10-CM | POA: Diagnosis present

## 2020-02-22 DIAGNOSIS — E785 Hyperlipidemia, unspecified: Secondary | ICD-10-CM | POA: Diagnosis present

## 2020-02-22 DIAGNOSIS — Z79890 Hormone replacement therapy (postmenopausal): Secondary | ICD-10-CM

## 2020-02-22 DIAGNOSIS — H547 Unspecified visual loss: Secondary | ICD-10-CM | POA: Diagnosis present

## 2020-02-22 DIAGNOSIS — Z885 Allergy status to narcotic agent status: Secondary | ICD-10-CM

## 2020-02-22 DIAGNOSIS — I959 Hypotension, unspecified: Secondary | ICD-10-CM | POA: Diagnosis present

## 2020-02-22 DIAGNOSIS — H409 Unspecified glaucoma: Secondary | ICD-10-CM | POA: Diagnosis present

## 2020-02-22 DIAGNOSIS — D631 Anemia in chronic kidney disease: Secondary | ICD-10-CM | POA: Diagnosis present

## 2020-02-22 DIAGNOSIS — Z79899 Other long term (current) drug therapy: Secondary | ICD-10-CM

## 2020-02-22 DIAGNOSIS — I340 Nonrheumatic mitral (valve) insufficiency: Secondary | ICD-10-CM | POA: Diagnosis present

## 2020-02-22 DIAGNOSIS — I120 Hypertensive chronic kidney disease with stage 5 chronic kidney disease or end stage renal disease: Secondary | ICD-10-CM | POA: Diagnosis not present

## 2020-02-22 DIAGNOSIS — I5189 Other ill-defined heart diseases: Secondary | ICD-10-CM

## 2020-02-22 LAB — CBC WITH DIFFERENTIAL/PLATELET
Abs Immature Granulocytes: 0 10*3/uL (ref 0.00–0.07)
Basophils Absolute: 0 10*3/uL (ref 0.0–0.1)
Basophils Relative: 1 %
Eosinophils Absolute: 0.2 10*3/uL (ref 0.0–0.5)
Eosinophils Relative: 4 %
HCT: 26.1 % — ABNORMAL LOW (ref 39.0–52.0)
Hemoglobin: 8.1 g/dL — ABNORMAL LOW (ref 13.0–17.0)
Immature Granulocytes: 0 %
Lymphocytes Relative: 22 %
Lymphs Abs: 1 10*3/uL (ref 0.7–4.0)
MCH: 27.3 pg (ref 26.0–34.0)
MCHC: 31 g/dL (ref 30.0–36.0)
MCV: 87.9 fL (ref 80.0–100.0)
Monocytes Absolute: 0.4 10*3/uL (ref 0.1–1.0)
Monocytes Relative: 10 %
Neutro Abs: 2.9 10*3/uL (ref 1.7–7.7)
Neutrophils Relative %: 63 %
Platelets: 101 10*3/uL — ABNORMAL LOW (ref 150–400)
RBC: 2.97 MIL/uL — ABNORMAL LOW (ref 4.22–5.81)
RDW: 15.3 % (ref 11.5–15.5)
WBC: 4.5 10*3/uL (ref 4.0–10.5)
nRBC: 0 % (ref 0.0–0.2)

## 2020-02-22 NOTE — ED Provider Notes (Signed)
[facility] EMERGENCY DEPARTMENT Provider Note   CSN: [id number] Arrival date & time: 02/22/20  1743     History Chief Complaint  Patient presents with  . unable to swallow    Brian Liu is a 69 y.o. male.  HPI   25yM with dysphagia. Worsening over past several days to the point of being unable to swallow at all. Pt was seen at [facility] on 02/16/21 for expressive aphasia and L sided weakness. Evaluated by neurology but not TPA candidate because he had just had a vascular procedure on dialysis fistula earlier in the day. Symptoms improved. Ended up being admitted to [facility] facility in [place] for stroke work up and for dialysis capability.  During this admission he had difficulty swallowing and he report he had endoscopy. Son reports he was advised to have thickened foods and "may need to have his throat stretched." He was discharged the next day. Dysphagia has progressed to point that he cannot swallow any food or liquids. Has tried eggs, water, pudding, applesauce and it all come back up. No pain. He thinks his last dialysis was Saturday.   Past Medical History:  Diagnosis Date  . Atrial fibrillation (HCC)    Permanent, Not on Coumadin because of history of lower GI bleed  . Diastolic dysfunction    Restrictive  diastolic filling pattern echo,  February, 2013  . Dyslipidemia   . Ejection fraction    EF 60-65%, echo, 2013  . ESRD (end stage renal disease) ([place])    Dialysis, fistula in left arm, M-W-F  . Glaucoma   . Hypertension   . Lower gastrointestinal bleed   . LVH (left ventricular hypertrophy)   . 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 Mitral regurgitation    Mild by echo, 2013  . Multiple myeloma ([place]) 04/30/2016  . Pancytopenia ([place])    Dr Ignacia Bayley  . Pulmonary hypertension (HCC)    Severe, PA pressure 65-70 mm of mercury, echo, 2013  . Tobacco abuse     Patient Active Problem List   Diagnosis Date Noted  . Primary osteoarthritis,  left shoulder 06/18/2018  . Venous stenosis of left upper extremity 04/03/2017  . Elevated troponin   . Dehydration 12/24/2016  . ESRD on hemodialysis ([place]) 12/24/2016  . Nausea vomiting and diarrhea 12/24/2016  . Enteritis 12/04/2016  . Thrush 12/04/2016  . Pancreatitis 12/02/2016  . Hypokalemia 12/02/2016  . Anemia 12/02/2016  . Abdominal pain   . Multiple myeloma ([place]) 04/30/2016  . Alcoholic pancreatitis 26/83/4196  . Complication of dialysis access insertion 05/17/2014  . ESRD (end stage renal disease) ([place])   . Atrial fibrillation ([place])   . Hypertension   . Pulmonary hypertension ([place])   . Mitral regurgitation   . Tobacco abuse   . Dyslipidemia   . Lower gastrointestinal bleed   . Pancytopenia ([place])   . Ejection fraction   . Diastolic dysfunction   . LVH (left ventricular hypertrophy)     Past Surgical History:  Procedure Laterality Date  . A/V FISTULAGRAM Left 04/03/2017   Procedure: A/V Fistulagram;  Surgeon: Conrad Sherwood, MD;  Location: [place] [facility];  Service: Cardiovascular;  Laterality: Left;  . A/V FISTULAGRAM Left 08/06/2018   Procedure: A/V FISTULAGRAM;  Surgeon: [name], MD;  Location: [place] [facility];  Service: Cardiovascular;  Laterality: Left;  . AV FISTULA PLACEMENT Left 1992   Left arm   . AV FISTULA PLACEMENT Right 02/17/2020   Procedure: INSERTION OF  RIGHT ARM  ARTERIOVENOUS (AV)  GRAFT;  Surgeon: [name], MD;  Location: [place];  Service: Vascular;  Laterality: Right;  . IR FLUORO GUIDE CV LINE RIGHT  06/23/2018  . IR FLUORO GUIDE CV LINE RIGHT  12/28/2019  . IR FLUORO GUIDE CV LINE RIGHT  01/20/2020  . IR PTA VENOUS EXCEPT DIALYSIS CIRCUIT  01/20/2020  . IR REMOVAL TUN CV CATH W/O FL  08/20/2018  . IR US GUIDE VASC ACCESS RIGHT  06/23/2018  . IR US GUIDE VASC ACCESS RIGHT  12/28/2019  . IR VENOCAVAGRAM SVC  01/20/2020  . 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 PERIPHERAL VASCULAR BALLOON ANGIOPLASTY Left 08/06/2018   Procedure: PERIPHERAL VASCULAR BALLOON ANGIOPLASTY;  Surgeon: [name], MD;  Location: [place] [facility];  Service: Cardiovascular;  Laterality: Left;       Family History  Problem Relation Age of Onset  . Other Mother        Deceased, 37  . Stomach cancer Father        Deceased, 6  . Kidney failure Son   . Healthy Son     Social History   Tobacco Use  . Smoking status: Never Smoker  . Smokeless tobacco: Never Used  Vaping Use  . Vaping Use: Never used  Substance Use Topics  . Alcohol use: No    Alcohol/week: 0.0 standard drinks  . Drug use: No    Home Medications Prior to Admission medications   Medication Sig Start Date End Date Taking? Authorizing Provider  bimatoprost (LUMIGAN) 0.03 % ophthalmic solution Place 1 drop into both eyes at bedtime.   Yes [provider]  cinacalcet (SENSIPAR) 30 MG tablet Take 30 mg by mouth at bedtime.    Yes [provider]  dorzolamide-timolol (COSOPT) 22.3-6.8 MG/ML ophthalmic solution Place 1 drop into both eyes 2 (two) times daily.    Yes [provider]  ferric citrate (AURYXIA) 1 GM 210 MG(Fe) tablet Take 210 mg by mouth 3 (three) times daily with meals.   Yes [provider]  folic acid (FOLVITE) 1 MG tablet Take 1 mg by mouth daily.   Yes [provider]  folic acid-vitamin b complex-vitamin c-selenium-zinc (DIALYVITE) 3 MG TABS tablet Take 1 tablet by mouth daily.   Yes [provider]  gabapentin (NEURONTIN) 100 MG capsule Take 100 mg by mouth 2 (two) times daily.  06/14/18  Yes [provider]  lanthanum (FOSRENOL) 1000 MG chewable tablet Chew 500-1,000 mg by mouth See admin  instructions. Chew 1065m twice daily with meals and 5023mwith snacks   Yes [provider]  Latanoprostene Bunod (VYZULTA) 0.024 % SOLN Place 1 drop into both eyes at bedtime.   Yes [provider]  levothyroxine (SYNTHROID) 137 MCG tablet Take 137 mcg by mouth daily before breakfast.   Yes [provider]  megestrol (MEGACE) 20 MG tablet Take 20 mg by mouth daily.   Yes [provider]  midodrine (PROAMATINE) 10 MG tablet Take 15 mg by mouth in the morning and at bedtime.  04/10/17  Yes [provider]  mirtazapine (REMERON) 7.5 MG tablet Take 7.5 mg by mouth at bedtime. 04/01/17  Yes [provider]  oxyCODONE-acetaminophen (PERCOCET) 5-325 MG tablet Take 1 tablet by mouth every 4 (four) hours as needed for severe pain. 02/17/20 02/16/21 Yes Baglia, Corrina, PA-C  simvastatin (ZOCOR) 20 MG tablet Take 20 mg by mouth daily.   Yes [provider]    Allergies    Hydrocodone  Review of Systems   Review of Systems All systems reviewed and negative, other than as noted in HPI.  Physical Exam Updated Vital Signs BP 101/82   Pulse 63   Temp 98 F (36.7 C)   Resp 15   Ht '6\' 2"'  (1.88 m)   Wt 78 kg   SpO2 100%   BMI 22.08 kg/m   Physical Exam Vitals and nursing note reviewed.  Constitutional:      General: He is not in acute distress.    Appearance: He is well-developed.  HENT:     Head: Normocephalic and atraumatic.  Eyes:     General:        Right eye: No discharge.        Left eye: No discharge.     Conjunctiva/sclera: Conjunctivae normal.  Cardiovascular:     Rate and Rhythm: Normal rate and regular rhythm.     Heart sounds: Normal heart sounds. No murmur heard.  No friction rub. No gallop.   Pulmonary:     Effort: Pulmonary effort is normal. No respiratory distress.     Breath sounds: Normal breath sounds.  Abdominal:     General: There is no distension.     Palpations: Abdomen is soft.     Tenderness: There  is no abdominal tenderness.  Musculoskeletal:        General: No tenderness.     Cervical back: Neck supple.  Skin:    General: Skin is warm and dry.  Neurological:     Mental Status: He is alert. Mental status is at baseline.     Cranial Nerves: No cranial nerve deficit.     Motor: No weakness.     Coordination: Coordination normal.  Psychiatric:        Behavior: Behavior normal.        Thought Content: Thought content normal.     ED Results / Procedures / Treatments   Labs (all labs ordered are listed, but only abnormal results are displayed) Labs Reviewed  SARS CORONAVIRUS 2 BY RT PCR ([place], [place] [facility]) - Abnormal; Notable for the following components:      Result Value   SARS Coronavirus 2 POSITIVE (*)    All other components within normal limits  COMPREHENSIVE METABOLIC PANEL - Abnormal; Notable for the following components:   BUN 51 (*)    Creatinine, Ser 9.30 (*)    Calcium 7.7 (*)    Total Protein 6.3 (*)    Albumin 3.0 (*)    AST 11 (*)    GFR calc non Af Amer 5 (*)    GFR calc Af Amer 6 (*)    All other components within normal limits  CBC WITH DIFFERENTIAL/PLATELET - Abnormal; Notable for the following components:   RBC 2.97 (*)    Hemoglobin 8.1 (*)    HCT 26.1 (*)    Platelets 101 (*)    All other components within normal limits    EKG None  Radiology No results found.  Procedures Procedures (including critical care time)  Medications Ordered in ED Medications - No data to display  ED Course  I have reviewed  the triage vital signs and the nursing notes.  Pertinent labs & imaging results that were available during my care of the patient were reviewed by me and considered in my medical decision making (see chart for details).    MDM Rules/Calculators/A&P                          69yM with dysphagia. I saw him try to drink water while in the room and he regurgitates it back up. I can see anesthesia event  for endoscopy on 02/19/20 but I cannot see procedure note, GI recommendations or discharge summary from this hospitalization. From son's description it sounds like he may have a stricture? Will try to get records. Regardless, he cannot swallow liquids. IVF. Admit.   Brian Liu was evaluated in Emergency Department on 02/27/2020 for the symptoms described in the history of present illness. He was evaluated in the context of the global COVID-19 pandemic, which necessitated consideration that the patient might be at risk for infection with the SARS-CoV-2 virus that causes COVID-19. Institutional protocols and algorithms that pertain to the evaluation of patients at risk for COVID-19 are in a state of rapid change based on information released by regulatory bodies including the CDC and federal and state organizations. These policies and algorithms were followed during the patient's care in the ED.    Final Clinical Impression(s) / ED Diagnoses Final diagnoses:  Dysphagia, unspecified type  ESRD (end stage renal disease) on dialysis ([place])  COVID-19 virus infection    Rx / DC Orders ED Discharge Orders    None       Virgel Manifold, MD 02/27/20 1757

## 2020-02-22 NOTE — ED Triage Notes (Signed)
Pt admitted at [facility] Thursday and released on Sunday evening for kidney function and had dialysis on Friday.   Pt unable to swallow then. Pt was on pureed food and thicken liquids.  Caregiver tried giving pt thickened liquids and pt unable to swallow it and comes right back up.

## 2020-02-23 ENCOUNTER — Inpatient Hospital Stay (HOSPITAL_COMMUNITY): Payer: Medicare Other

## 2020-02-23 DIAGNOSIS — U071 COVID-19: Secondary | ICD-10-CM

## 2020-02-23 DIAGNOSIS — R05 Cough: Secondary | ICD-10-CM | POA: Diagnosis not present

## 2020-02-23 DIAGNOSIS — I272 Pulmonary hypertension, unspecified: Secondary | ICD-10-CM | POA: Diagnosis not present

## 2020-02-23 DIAGNOSIS — I1311 Hypertensive heart and chronic kidney disease without heart failure, with stage 5 chronic kidney disease, or end stage renal disease: Secondary | ICD-10-CM | POA: Diagnosis present

## 2020-02-23 DIAGNOSIS — H547 Unspecified visual loss: Secondary | ICD-10-CM | POA: Diagnosis present

## 2020-02-23 DIAGNOSIS — Z79890 Hormone replacement therapy (postmenopausal): Secondary | ICD-10-CM | POA: Diagnosis not present

## 2020-02-23 DIAGNOSIS — T8612 Kidney transplant failure: Secondary | ICD-10-CM | POA: Diagnosis present

## 2020-02-23 DIAGNOSIS — R1312 Dysphagia, oropharyngeal phase: Secondary | ICD-10-CM | POA: Diagnosis not present

## 2020-02-23 DIAGNOSIS — E785 Hyperlipidemia, unspecified: Secondary | ICD-10-CM | POA: Diagnosis not present

## 2020-02-23 DIAGNOSIS — N2581 Secondary hyperparathyroidism of renal origin: Secondary | ICD-10-CM | POA: Diagnosis not present

## 2020-02-23 DIAGNOSIS — Z87891 Personal history of nicotine dependence: Secondary | ICD-10-CM | POA: Diagnosis not present

## 2020-02-23 DIAGNOSIS — D631 Anemia in chronic kidney disease: Secondary | ICD-10-CM | POA: Diagnosis present

## 2020-02-23 DIAGNOSIS — I340 Nonrheumatic mitral (valve) insufficiency: Secondary | ICD-10-CM | POA: Diagnosis present

## 2020-02-23 DIAGNOSIS — N186 End stage renal disease: Secondary | ICD-10-CM | POA: Diagnosis not present

## 2020-02-23 DIAGNOSIS — M898X9 Other specified disorders of bone, unspecified site: Secondary | ICD-10-CM | POA: Diagnosis present

## 2020-02-23 DIAGNOSIS — H409 Unspecified glaucoma: Secondary | ICD-10-CM | POA: Diagnosis present

## 2020-02-23 DIAGNOSIS — I4821 Permanent atrial fibrillation: Secondary | ICD-10-CM | POA: Diagnosis not present

## 2020-02-23 DIAGNOSIS — Z992 Dependence on renal dialysis: Secondary | ICD-10-CM | POA: Diagnosis not present

## 2020-02-23 DIAGNOSIS — I10 Essential (primary) hypertension: Secondary | ICD-10-CM | POA: Diagnosis not present

## 2020-02-23 DIAGNOSIS — C9000 Multiple myeloma not having achieved remission: Secondary | ICD-10-CM | POA: Diagnosis not present

## 2020-02-23 DIAGNOSIS — I120 Hypertensive chronic kidney disease with stage 5 chronic kidney disease or end stage renal disease: Secondary | ICD-10-CM | POA: Diagnosis not present

## 2020-02-23 DIAGNOSIS — I959 Hypotension, unspecified: Secondary | ICD-10-CM | POA: Diagnosis not present

## 2020-02-23 DIAGNOSIS — Z79899 Other long term (current) drug therapy: Secondary | ICD-10-CM | POA: Diagnosis not present

## 2020-02-23 DIAGNOSIS — Z885 Allergy status to narcotic agent status: Secondary | ICD-10-CM | POA: Diagnosis not present

## 2020-02-23 DIAGNOSIS — R1310 Dysphagia, unspecified: Secondary | ICD-10-CM | POA: Diagnosis not present

## 2020-02-23 DIAGNOSIS — I5189 Other ill-defined heart diseases: Secondary | ICD-10-CM | POA: Diagnosis not present

## 2020-02-23 LAB — COMPREHENSIVE METABOLIC PANEL
ALT: <5 U/L (ref 0–44)
ALT: <5 U/L (ref 0–44)
AST: 11 U/L — ABNORMAL LOW (ref 15–41)
AST: 11 U/L — ABNORMAL LOW (ref 15–41)
Albumin: 3 g/dL — ABNORMAL LOW (ref 3.5–5.0)
Albumin: 3 g/dL — ABNORMAL LOW (ref 3.5–5.0)
Alkaline Phosphatase: 50 U/L (ref 38–126)
Alkaline Phosphatase: 50 U/L (ref 38–126)
Anion gap: 15 (ref 5–15)
Anion gap: 17 — ABNORMAL HIGH (ref 5–15)
BUN: 51 mg/dL — ABNORMAL HIGH (ref 8–23)
BUN: 54 mg/dL — ABNORMAL HIGH (ref 8–23)
CO2: 23 mmol/L (ref 22–32)
CO2: 24 mmol/L (ref 22–32)
Calcium: 7.7 mg/dL — ABNORMAL LOW (ref 8.9–10.3)
Calcium: 8 mg/dL — ABNORMAL LOW (ref 8.9–10.3)
Chloride: 99 mmol/L (ref 98–111)
Chloride: 99 mmol/L (ref 98–111)
Creatinine, Ser: 9.3 mg/dL — ABNORMAL HIGH (ref 0.61–1.24)
Creatinine, Ser: 9.62 mg/dL — ABNORMAL HIGH (ref 0.61–1.24)
GFR calc Af Amer: 6 mL/min — ABNORMAL LOW (ref >60)
GFR calc Af Amer: 6 mL/min — ABNORMAL LOW (ref >60)
GFR calc non Af Amer: 5 mL/min — ABNORMAL LOW (ref >60)
GFR calc non Af Amer: 5 mL/min — ABNORMAL LOW (ref >60)
Glucose, Bld: 71 mg/dL (ref 70–99)
Glucose, Bld: 62 mg/dL — ABNORMAL LOW (ref 70–99)
Potassium: 3.9 mmol/L (ref 3.5–5.1)
Potassium: 4.2 mmol/L (ref 3.5–5.1)
Sodium: 137 mmol/L (ref 135–145)
Sodium: 140 mmol/L (ref 135–145)
Total Bilirubin: 0.8 mg/dL (ref 0.3–1.2)
Total Bilirubin: 0.8 mg/dL (ref 0.3–1.2)
Total Protein: 6.3 g/dL — ABNORMAL LOW (ref 6.5–8.1)
Total Protein: 6.4 g/dL — ABNORMAL LOW (ref 6.5–8.1)

## 2020-02-23 LAB — CBC
HCT: 27 % — ABNORMAL LOW (ref 39.0–52.0)
Hemoglobin: 8.4 g/dL — ABNORMAL LOW (ref 13.0–17.0)
MCH: 27.7 pg (ref 26.0–34.0)
MCHC: 31.1 g/dL (ref 30.0–36.0)
MCV: 89.1 fL (ref 80.0–100.0)
Platelets: 98 10*3/uL — ABNORMAL LOW (ref 150–400)
RBC: 3.03 MIL/uL — ABNORMAL LOW (ref 4.22–5.81)
RDW: 15.4 % (ref 11.5–15.5)
WBC: 4.2 10*3/uL (ref 4.0–10.5)
nRBC: 0 % (ref 0.0–0.2)

## 2020-02-23 LAB — GLUCOSE, CAPILLARY
Glucose-Capillary: 46 mg/dL — ABNORMAL LOW (ref 70–99)
Glucose-Capillary: 50 mg/dL — ABNORMAL LOW (ref 70–99)
Glucose-Capillary: 91 mg/dL (ref 70–99)
Glucose-Capillary: 91 mg/dL (ref 70–99)

## 2020-02-23 LAB — SARS CORONAVIRUS 2 BY RT PCR (HOSPITAL ORDER, PERFORMED IN ~~LOC~~ HOSPITAL LAB): SARS Coronavirus 2: POSITIVE — AB

## 2020-02-23 LAB — HEMOGLOBIN A1C
Hgb A1c MFr Bld: 5.1 % (ref 4.8–5.6)
Mean Plasma Glucose: 99.67 mg/dL

## 2020-02-23 LAB — HIV ANTIBODY (ROUTINE TESTING W REFLEX): HIV Screen 4th Generation wRfx: NONREACTIVE

## 2020-02-23 LAB — MRSA PCR SCREENING: MRSA by PCR: NEGATIVE

## 2020-02-23 LAB — HEPATITIS B SURFACE ANTIGEN: Hepatitis B Surface Ag: NONREACTIVE

## 2020-02-23 MED ORDER — PARICALCITOL 5 MCG/ML IV SOLN: 2.0000 ug | INTRAVENOUS | Status: DC

## 2020-02-23 MED ORDER — DORZOLAMIDE HCL-TIMOLOL MAL 2-0.5 % OP SOLN
1.0000 [drp] | Freq: Two times a day (BID) | OPHTHALMIC | Status: DC
  Administered 2020-02-23 – 2020-02-26 (×7): 1 [drp] via OPHTHALMIC
  Filled 2020-02-23 (×3): qty 10

## 2020-02-23 MED ORDER — MIDODRINE HCL 5 MG PO TABS: 10.0000 mg | ORAL_TABLET | Freq: Three times a day (TID) | ORAL | Status: DC

## 2020-02-23 MED ORDER — DEXTROSE 50 % IV SOLN
INTRAVENOUS | Status: AC
  Administered 2020-02-23: 50 mL
  Filled 2020-02-23: qty 50

## 2020-02-23 MED ORDER — HEPARIN SODIUM (PORCINE) 1000 UNIT/ML DIALYSIS: 1000.0000 [IU] | INTRAMUSCULAR | Status: DC | PRN

## 2020-02-23 MED ORDER — CHLORHEXIDINE GLUCONATE CLOTH 2 % EX PADS
6.0000 | MEDICATED_PAD | Freq: Every day | CUTANEOUS | Status: DC
  Administered 2020-02-23 – 2020-02-25 (×3): 6 via TOPICAL

## 2020-02-23 MED ORDER — SIMVASTATIN 20 MG PO TABS
20.0000 mg | ORAL_TABLET | Freq: Every day | ORAL | Status: DC
  Administered 2020-02-24 – 2020-02-25 (×2): 20 mg via ORAL
  Filled 2020-02-23 (×3): qty 1

## 2020-02-23 MED ORDER — INSULIN ASPART 100 UNIT/ML ~~LOC~~ SOLN: 0.0000 [IU] | SUBCUTANEOUS | Status: DC

## 2020-02-23 MED ORDER — CINACALCET HCL 30 MG PO TABS: 30.0000 mg | ORAL_TABLET | Freq: Every day | ORAL | Status: DC

## 2020-02-23 MED ORDER — SODIUM CHLORIDE 0.9 % IV SOLN: 250.0000 mL | INTRAVENOUS | Status: DC | PRN

## 2020-02-23 MED ORDER — LIDOCAINE HCL (PF) 1 % IJ SOLN: 5.0000 mL | INTRAMUSCULAR | Status: DC | PRN

## 2020-02-23 MED ORDER — HEPARIN SODIUM (PORCINE) 5000 UNIT/ML IJ SOLN
5000.0000 [IU] | Freq: Three times a day (TID) | INTRAMUSCULAR | Status: DC
  Administered 2020-02-23 – 2020-02-26 (×9): 5000 [IU] via SUBCUTANEOUS
  Filled 2020-02-23 (×8): qty 1

## 2020-02-23 MED ORDER — FERRIC CITRATE 1 GM 210 MG(FE) PO TABS: 210.0000 mg | ORAL_TABLET | Freq: Three times a day (TID) | ORAL | Status: DC

## 2020-02-23 MED ORDER — MIRTAZAPINE 15 MG PO TABS
7.5000 mg | ORAL_TABLET | Freq: Every day | ORAL | Status: DC
  Administered 2020-02-24: 7.5 mg via ORAL
  Filled 2020-02-23 (×2): qty 1

## 2020-02-23 MED ORDER — ONDANSETRON HCL 4 MG/2ML IJ SOLN: 4.0000 mg | Freq: Four times a day (QID) | INTRAMUSCULAR | Status: DC | PRN

## 2020-02-23 MED ORDER — SODIUM CHLORIDE 0.9% FLUSH: 3.0000 mL | INTRAVENOUS | Status: DC | PRN

## 2020-02-23 MED ORDER — SODIUM CHLORIDE 0.9% FLUSH
3.0000 mL | Freq: Two times a day (BID) | INTRAVENOUS | Status: DC
  Administered 2020-02-23 – 2020-02-26 (×5): 3 mL via INTRAVENOUS

## 2020-02-23 MED ORDER — LEVOTHYROXINE SODIUM 100 MCG/5ML IV SOLN
68.5000 ug | Freq: Every day | INTRAVENOUS | Status: DC
  Filled 2020-02-23: qty 5

## 2020-02-23 MED ORDER — MIDODRINE HCL 5 MG PO TABS
15.0000 mg | ORAL_TABLET | Freq: Two times a day (BID) | ORAL | Status: DC
  Administered 2020-02-24 – 2020-02-26 (×4): 15 mg via ORAL
  Filled 2020-02-23 (×5): qty 3

## 2020-02-23 MED ORDER — SODIUM CHLORIDE 0.9 % IV SOLN: 100.0000 mL | INTRAVENOUS | Status: DC | PRN

## 2020-02-23 MED ORDER — CINACALCET HCL 30 MG PO TABS
30.0000 mg | ORAL_TABLET | Freq: Every day | ORAL | Status: DC
  Administered 2020-02-24 – 2020-02-25 (×2): 30 mg via ORAL
  Filled 2020-02-23 (×3): qty 1

## 2020-02-23 MED ORDER — LIDOCAINE-PRILOCAINE 2.5-2.5 % EX CREA: 1.0000 "application " | TOPICAL_CREAM | CUTANEOUS | Status: DC | PRN

## 2020-02-23 MED ORDER — SODIUM CHLORIDE 0.9 % IV SOLN: 250.0000 mL | INTRAVENOUS | Status: DC

## 2020-02-23 MED ORDER — LATANOPROST 0.005 % OP SOLN
1.0000 [drp] | Freq: Every day | OPHTHALMIC | Status: DC
  Administered 2020-02-23 – 2020-02-24 (×2): 1 [drp] via OPHTHALMIC
  Filled 2020-02-23 (×2): qty 2.5

## 2020-02-23 MED ORDER — NOREPINEPHRINE 4 MG/250ML-% IV SOLN: 0.0000 ug/min | INTRAVENOUS | Status: DC

## 2020-02-23 MED ORDER — CHLORHEXIDINE GLUCONATE CLOTH 2 % EX PADS
6.0000 | MEDICATED_PAD | Freq: Every day | CUTANEOUS | Status: DC
  Administered 2020-02-23 – 2020-02-26 (×4): 6 via TOPICAL

## 2020-02-23 MED ORDER — PENTAFLUOROPROP-TETRAFLUOROETH EX AERO: 1.0000 "application " | INHALATION_SPRAY | CUTANEOUS | Status: DC | PRN

## 2020-02-23 MED ORDER — NOREPINEPHRINE 4 MG/250ML-% IV SOLN
2.0000 ug/min | INTRAVENOUS | Status: DC
  Administered 2020-02-23: 2 ug/min via INTRAVENOUS
  Administered 2020-02-23: 4 ug/min via INTRAVENOUS
  Filled 2020-02-23 (×2): qty 250

## 2020-02-23 MED ORDER — ALTEPLASE 2 MG IJ SOLR: 2.0000 mg | Freq: Once | INTRAMUSCULAR | Status: DC | PRN

## 2020-02-23 MED ORDER — ONDANSETRON HCL 4 MG PO TABS: 4.0000 mg | ORAL_TABLET | Freq: Four times a day (QID) | ORAL | Status: DC | PRN

## 2020-02-23 NOTE — ED Notes (Signed)
Per MD order, pt given sip of water to see if he could swallow, MD present for swallow screen, pt failed. Pt spit water back out.

## 2020-02-23 NOTE — ED Notes (Signed)
Pts O2 87% on RA, pt placed on 2L

## 2020-02-23 NOTE — Procedures (Signed)
     HEMODIALYSIS TREATMENT NOTE:   Uneventful 3.25 hour heparin-free treatment completed right chest wall TDC.  Exit site unremarkable. Kept even / no UF as ordered.  Hemodynamically stable on Levophed infusion.  All blood was returned.  The catheter dressing present on admission was labeled "Na Citr" on the inside.  Pt states he is unaware of any heparin intolerance.  Unable to contact outpatient HD center for further info.  Catheter was saline locked post-HD; will investigate further tomorrow.  Platelets 98 today.   Rockwell Alexandria, RN

## 2020-02-23 NOTE — H&P (Addendum)
TRH H&P    Patient Demographics:    Brian Liu, is a 69 y.o. male  MRN: [id number]  DOB - Apr 03, 1951  Admit Date - 02/22/2020  Referring MD/NP/PA: Tyrell Antonio  Outpatient Primary MD for the patient is [name], MD  Patient coming from: Home  Chief complaint-difficulty swallowing   HPI:    Brian Liu  is a 69 y.o. male, with history of permanent atrial fibrillation not on Coumadin due to GI bleed, diastolic dysfunction, ESRD on hemodialysis MWF, hypertension, multiple myeloma, pulmonary hypertension who came to hospital with chief complaint of difficulty swallowing.  Patient was seen at [facility] on 02/16/2021 for expressive aphasia and left-sided weakness.  At that time he was evaluated by neurology but found not to be a TPA candidate because he just had vascular procedure getting dialysis fistula earlier that day.  Patient symptoms improved.  He ended up being admitted to [facility] facility in [place] for stroke work-up.  Patient was found to have difficulty swallowing, he had EGD which was unremarkable.  I reviewed EGD report from [facility] and showed no significant abnormality.  His stroke work-up also was negative. He was discharged with diet recommendation for thickened foods.  As per patient what ever he tries to swallow comes right back up. He denies chest pain or shortness of breath. Denies nausea, vomiting or diarrhea.     Review of systems:    In addition to the HPI above,   All other systems reviewed and are negative.    Past History of the following :    Past Medical History:  Diagnosis Date  . Atrial fibrillation (HCC)    Permanent, Not on Coumadin because of history of lower GI bleed  . Diastolic dysfunction    Restrictive  diastolic filling pattern echo,  February, 2013  . Dyslipidemia   . Ejection fraction    EF 60-65%, echo, 2013  . ESRD (end stage renal disease) ([place])     Dialysis, fistula in left arm, M-W-F  . Glaucoma   . Hypertension   . Lower gastrointestinal bleed   . LVH (left ventricular hypertrophy)   . 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 Mitral regurgitation    Mild by echo, 2013  . Multiple myeloma ([place]) 04/30/2016  . Pancytopenia ([place])    Dr Ignacia Bayley  . Pulmonary hypertension (HCC)    Severe, PA pressure 65-70 mm of mercury, echo, 2013  . Tobacco abuse       Past Surgical History:  Procedure Laterality Date  . A/V FISTULAGRAM Left 04/03/2017   Procedure: A/V Fistulagram;  Surgeon: Conrad Longview, MD;  Location: [place] [facility];  Service: Cardiovascular;  Laterality: Left;  . A/V FISTULAGRAM Left 08/06/2018   Procedure: A/V FISTULAGRAM;  Surgeon: [name], MD;  Location: [place] [facility];  Service: Cardiovascular;  Laterality: Left;  . AV FISTULA PLACEMENT Left 1992   Left arm   . AV FISTULA PLACEMENT Right 02/17/2020   Procedure: INSERTION OF RIGHT ARM  ARTERIOVENOUS (AV)  GRAFT;  Surgeon: [name], MD;  Location: [place] [facility];  Service: Vascular;  Laterality: Right;  . IR FLUORO GUIDE CV LINE RIGHT  06/23/2018  . IR FLUORO GUIDE CV LINE RIGHT  12/28/2019  . IR FLUORO GUIDE CV LINE RIGHT  01/20/2020  . IR PTA VENOUS EXCEPT DIALYSIS CIRCUIT  01/20/2020  . IR REMOVAL TUN CV CATH W/O FL  08/20/2018  . IR US GUIDE VASC ACCESS RIGHT  06/23/2018  . IR US GUIDE VASC ACCESS RIGHT  12/28/2019  . IR VENOCAVAGRAM SVC  01/20/2020  . 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 PERIPHERAL VASCULAR BALLOON ANGIOPLASTY Left 08/06/2018   Procedure: PERIPHERAL VASCULAR BALLOON ANGIOPLASTY;  Surgeon: [name], MD;  Location: [facility];  Service: Cardiovascular;  Laterality: Left;      Social History:      Social History   Tobacco Use  . Smoking status: Never Smoker  . Smokeless tobacco: Never Used  Substance Use Topics  . Alcohol use: No    Alcohol/week: 0.0 standard drinks       Family History :     Family History  Problem Relation Age of Onset  . Other Mother        Deceased, 51  . Stomach cancer Father        Deceased, 24  . Kidney failure Son   . Healthy Son       Home Medications:   Prior to Admission medications   Medication Sig Start Date End Date Taking? Authorizing Provider  bimatoprost (LUMIGAN) 0.03 % ophthalmic solution Place 1 drop into both eyes at bedtime.   Yes [provider]  cinacalcet (SENSIPAR) 30 MG tablet Take 30 mg by mouth at bedtime.    Yes [provider]  dorzolamide-timolol (COSOPT) 22.3-6.8 MG/ML ophthalmic solution Place 1 drop into both eyes 2 (two) times daily.    Yes [provider]  ferric citrate (AURYXIA) 1 GM 210 MG(Fe) tablet Take 210 mg by mouth 3 (three) times daily with meals.   Yes [provider]  folic acid (FOLVITE) 1 MG tablet Take 1 mg by mouth daily.   Yes [provider]  folic acid-vitamin b complex-vitamin c-selenium-zinc (DIALYVITE) 3 MG TABS tablet Take 1 tablet by mouth daily.   Yes [provider]  gabapentin (NEURONTIN) 100 MG capsule Take 100 mg by mouth 2 (two) times daily.  06/14/18  Yes [provider]  lanthanum (FOSRENOL) 1000 MG chewable tablet Chew 500-1,000 mg by mouth See admin instructions. Chew 1085m twice daily with meals and 5031mwith snacks   Yes [provider]  Latanoprostene Bunod (VYZULTA) 0.024 % SOLN Place 1 drop into both eyes at bedtime.   Yes [provider]  levothyroxine (SYNTHROID) 137 MCG tablet Take 137 mcg by mouth daily before breakfast.   Yes [provider]  megestrol (MEGACE) 20 MG tablet Take 20 mg by  mouth daily.   Yes [provider]  midodrine (PROAMATINE) 10 MG tablet Take 15 mg by mouth in the morning and at bedtime.  04/10/17  Yes [provider]  mirtazapine (REMERON) 7.5 MG tablet Take 7.5 mg by mouth at bedtime. 04/01/17  Yes [provider]  oxyCODONE-acetaminophen (PERCOCET) 5-325 MG tablet Take 1 tablet by mouth every 4 (four) hours as needed for severe pain. 02/17/20 02/16/21 Yes Baglia, Corrina, PA-C  simvastatin (ZOCOR) 20 MG tablet Take 20 mg by mouth daily.   Yes [provider]     Allergies:     Allergies  Allergen Reactions  . Hydrocodone Itching    Cough syrup     Physical Exam:   Vitals  Blood pressure (!) 91/55, pulse 79, temperature 98 F (36.7 C), resp. rate 15, height _0  (1.88 m), weight 78 kg, SpO2 100 %.  1.  General: Appears in no acute distress  2. Psychiatric: Alert, oriented x3, intact insight and judgment  3. Neurologic: Cranial nerves II through XII grossly intact, no focal deficit noted  4. HEENMT:  Atraumatic normocephalic, extraocular muscles are intact  5. Respiratory : Clear to auscultation bilaterally, no wheezing or crackles auscultated  6. Cardiovascular : S1-S2, regular, no murmur auscultated  7. Gastrointestinal:  Abdomen is soft, nontender, no organomegaly     Data Review:    CBC Recent Labs  Lab 02/17/20 1142 02/22/20 2304  WBC  --  4.5  HGB 12.2* 8.1*  HCT 36.0* 26.1*  PLT  --  101*  MCV  --  87.9  MCH  --  27.3  MCHC  --  31.0  RDW  --  15.3  LYMPHSABS  --  1.0  MONOABS  --  0.4  EOSABS  --  0.2  BASOSABS  --  0.0   ------------------------------------------------------------------------------------------------------------------  Results for orders placed or performed during the hospital encounter of 02/22/20 (from the past 48 hour(s))  SARS Coronavirus 2 by RT PCR (hospital order, performed in [facility] hospital lab) Nasopharyngeal Nasopharyngeal Swab      Status: Abnormal   Collection Time: 02/22/20  9:05 PM   Specimen: Nasopharyngeal Swab  Result Value Ref Range   SARS Coronavirus 2 POSITIVE (A) NEGATIVE    Comment: RESULT CALLED TO, READ BACK BY AND VERIFIED WITH: K NICHOLS,RN_1  02/23/20 MKELLY (NOTE) SARS-CoV-2 target nucleic acids are DETECTED  SARS-CoV-2 RNA is generally detectable in upper respiratory specimens  during the acute phase of infection.  Positive results are indicative  of the presence of the identified virus, but do not rule out bacterial infection or co-infection with other pathogens not detected by the test.  Clinical correlation with patient history and  other diagnostic information is necessary to determine patient infection status.  The expected result is negative.  Fact Sheet for Patients:   StrictlyIdeas.no   Fact Sheet for Healthcare Providers:   BankingDealers.co.za    This test is not yet approved or cleared by the [place] FDA and  has been authorized for detection and/or diagnosis of SARS-CoV-2 by FDA under an Emergency Use Authorization (EUA).  This EUA will remain in effect (meaning this tes t can be used) for the duration of  the COVID-19 declaration under Section 564(b)(1) of the Act, 21 U.S.C. section 360-bbb-3(b)(1), unless the authorization is terminated or revoked sooner.  Performed at [facility], [address], [place], [place] [address]   Comprehensive metabolic panel     Status: Abnormal   Collection Time: 02/22/20 11:04 PM  Result Value Ref Range   Sodium 137 135 - 145 mmol/L   Potassium 3.9 3.5 - 5.1 mmol/L   Chloride 99 98 - 111 mmol/L   CO2 23 22 - 32 mmol/L   Glucose, Bld 71 70 - 99 mg/dL  Comment: Glucose reference range applies only to samples taken after fasting for at least 8 hours.   BUN 51 (H) 8 - 23 mg/dL   Creatinine, Ser 9.30 (H) 0.61 - 1.24 mg/dL   Calcium 7.7 (L) 8.9 - 10.3 mg/dL   Total Protein 6.3 (L) 6.5  - 8.1 g/dL   Albumin 3.0 (L) 3.5 - 5.0 g/dL   AST 11 (L) 15 - 41 U/L   ALT <5 0 - 44 U/L   Alkaline Phosphatase 50 38 - 126 U/L   Total Bilirubin 0.8 0.3 - 1.2 mg/dL   GFR calc non Af Amer 5 (L) >60 mL/min   GFR calc Af Amer 6 (L) >60 mL/min   Anion gap 15 5 - 15    Comment: Performed at [facility], [address], [place], [place] [address]  CBC with Differential     Status: Abnormal   Collection Time: 02/22/20 11:04 PM  Result Value Ref Range   WBC 4.5 4.0 - 10.5 K/uL   RBC 2.97 (L) 4.22 - 5.81 MIL/uL   Hemoglobin 8.1 (L) 13.0 - 17.0 g/dL   HCT 26.1 (L) 39 - 52 %   MCV 87.9 80.0 - 100.0 fL   MCH 27.3 26.0 - 34.0 pg   MCHC 31.0 30.0 - 36.0 g/dL   RDW 15.3 11.5 - 15.5 %   Platelets 101 (L) 150 - 400 K/uL    Comment: PLATELET COUNT CONFIRMED BY SMEAR Immature Platelet Fraction may be clinically indicated, consider ordering this additional test [id number]    nRBC 0.0 0.0 - 0.2 %   Neutrophils Relative % 63 %   Neutro Abs 2.9 1.7 - 7.7 K/uL   Lymphocytes Relative 22 %   Lymphs Abs 1.0 0.7 - 4.0 K/uL   Monocytes Relative 10 %   Monocytes Absolute 0.4 0 - 1 K/uL   Eosinophils Relative 4 %   Eosinophils Absolute 0.2 0 - 0 K/uL   Basophils Relative 1 %   Basophils Absolute 0.0 0 - 0 K/uL   Immature Granulocytes 0 %   Abs Immature Granulocytes 0.00 0.00 - 0.07 K/uL    Comment: Performed at [facility], [address], [place], [place] [address]    Chemistries  Recent Labs  Lab 02/17/20 1142 02/22/20 2304  NA 140 137  K 3.3* 3.9  CL 97* 99  CO2  --  23  GLUCOSE 84 71  BUN 30* 51*  CREATININE 8.80* 9.30*  CALCIUM  --  7.7*  AST  --  11*  ALT  --  <5  ALKPHOS  --  50  BILITOT  --  0.8   ------------------------------------------------------------------------------------------------------------------  ------------------------------------------------------------------------------------------------------------------ GFR: Estimated Creatinine Clearance: 8.4 mL/min  (A) (by C-G formula based on SCr of 9.3 mg/dL (H)). Liver Function Tests: Recent Labs  Lab 02/22/20 2304  AST 11*  ALT <5  ALKPHOS 50  BILITOT 0.8  PROT 6.3*  ALBUMIN 3.0*       Imaging Results:    No results found.  My personal review of EKG: Rhythm - atrial fibrillation   Assessment & Plan:    Active Problems:   Dysphagia   1. Dysphagia-patient was diagnosed with oropharyngeal dysphagia, EGD done at [facility] was unremarkable.  Will obtain swallow evaluation in a.m. and consult GI in a.m. we will keep patient NPO. 2. COVID-19 infection-patient has SARS-CoV-2 test positive in the ED.  He is asymptomatic.  Not requiring oxygen.  We will continue to monitor.  Patient had 2 doses  of COVID-19 vaccine, as per patient. Will obtain CXR. 3. ESRD on HD-patient has dialysis Monday Wednesday Friday, unclear about his last dialysis.  Will consult nephrology in a.m. for hemodialysis. 4. Hypotension-patient blood pressure is significantly low, he is asymptomatic patient takes midodrine at home, we are holding p.o. medications due to dysphagia as above.  We will start him on Levophed gtt. 5. Atrial fibrillation-patient has permanent atrial fibrillation.  He is not on anticoagulation due to history of GI bleed.   DVT Prophylaxis-   patient  AM Labs Ordered, also please review Full Orders  Family Communication: Admission, patients condition and plan of care including tests being ordered have been discussed with the patient  who indicate understanding and agree with the plan and Code Status.  Code Status: Full code  Admission status: Inpatient :The appropriate admission status for this patient is INPATIENT. Inpatient status is judged to be reasonable and necessary in order to provide the required intensity of service to ensure the patient's safety. The patient's presenting symptoms, physical exam findings, and initial radiographic and laboratory data in the context of their chronic  comorbidities is felt to place them at high risk for further clinical deterioration. Furthermore, it is not anticipated that the patient will be medically stable for discharge from the hospital within 2 midnights of admission. The following factors support the admission status of inpatient.     The patient's presenting symptoms include difficulty swallowing The worrisome physical exam findings include dysphagia. The initial radiographic and laboratory data are worrisome because of dysphagia. The chronic co-morbidities include ESRD on HD.       * I certify that at the point of admission it is my clinical judgment that the patient will require inpatient hospital care spanning beyond 2 midnights from the point of admission due to high intensity of service, high risk for further deterioration and high frequency of surveillance required.*  Time spent in minutes : 60 minutes   Cadey Bazile S Colbie Danner M.D

## 2020-02-23 NOTE — ED Notes (Signed)
hospitalist in room  

## 2020-02-23 NOTE — ED Notes (Signed)
Provider aware of low BP

## 2020-02-23 NOTE — TOC Initial Note (Signed)
Transition of Care [facility]) - Initial/Assessment Note    Patient Details  Name: Brian Liu MRN: [id number] Date of Birth: Jan 18, 1951  Transition of Care [facility]) CM/SW Contact:    Ihor Gully, LCSW Phone Number: 02/23/2020, 4:39 PM  Clinical Narrative:                 Damaris Schooner with Ronny Bacon, facility nurse, at [facility]. Patient from home with son. Goes to [employer] on MWF schedule. Patient was hospitalized at [facility] and was released on Sunday 02/20/20. He did not go to dialysis on Monday due to not feeling well. Tuesday, his son brought him to [facility] for difficulty swallowing.  Patient will not be able to return to [facility] until he is 14 days out from his initial Covid + test. [facility] administrator, advised patient will have to go to the [facility] facility through the 14 days. Patient does not have transportation. Horris Latino states that she will contact 02/24/20 with the [facility] dialysis schedule.  Upon receipt of [facility] schedule, TOC will have to arrange transportation for patient's transport to [facility].  Expected Discharge Plan: Home/Self Care Barriers to Discharge: Other (comment) (Patient has COVID and cannot return back to [facility] for 14-21 days.)   Patient Goals and CMS Choice Patient states their goals for this hospitalization and ongoing recovery are:: Return home.      Expected Discharge Plan and Services Expected Discharge Plan: Home/Self Care       Living arrangements for the past 2 months: Single Family Home                                      Prior Living Arrangements/Services Living arrangements for the past 2 months: Single Family Home Lives with:: Adult Children Patient language and need for interpreter reviewed:: Yes Do you feel safe going back to the place where you live?: Yes      Need for Family Participation in Patient Care: Yes (Comment) Care giver support system in place?: Yes (comment)   Criminal  Activity/Legal Involvement Pertinent to Current Situation/Hospitalization: No - Comment as needed  Activities of Daily Living Home Assistive Devices/Equipment: Walker (specify type), Cane (specify quad or straight) ADL Screening (condition at time of admission) Patient's cognitive ability adequate to safely complete daily activities?: Yes Is the patient deaf or have difficulty hearing?: No Does the patient have difficulty seeing, even when wearing glasses/contacts?: Yes Does the patient have difficulty concentrating, remembering, or making decisions?: No Patient able to express need for assistance with ADLs?: Yes Does the patient have difficulty dressing or bathing?: No Independently performs ADLs?: Yes (appropriate for developmental age) Does the patient have difficulty walking or climbing stairs?: Yes Weakness of Legs: Both Weakness of Arms/Hands: None  Permission Sought/Granted Permission sought to share information with : Other (comment) Permission granted to share information with : Yes, Verbal Permission Granted  Share Information with NAME: Ronny Bacon and Horris Latino  Permission granted to share info w AGENCY: [place] granted to share info w Relationship: dialysis nurse and clinic manager     Emotional Assessment Appearance:: Appears stated age   Affect (typically observed): Appropriate Orientation: : Oriented to Self, Oriented to Place, Oriented to  Time, Oriented to Situation Alcohol / Substance Use: Not Applicable Psych Involvement: No (comment)  Admission diagnosis:  ESRD (end stage renal disease) on dialysis ([place]) [N18.6, Z99.2]  Dysphagia [R13.10] Dysphagia, unspecified type [R13.10] COVID-19 virus infection [U07.1] Patient Active Problem List   Diagnosis Date Noted  . Dysphagia 02/23/2020  . Primary osteoarthritis, left shoulder 06/18/2018  . Venous stenosis of left upper extremity 04/03/2017  . Elevated troponin   . Dehydration 12/24/2016  . ESRD on  hemodialysis ([place]) 12/24/2016  . Nausea vomiting and diarrhea 12/24/2016  . Enteritis 12/04/2016  . Thrush 12/04/2016  . Pancreatitis 12/02/2016  . Hypokalemia 12/02/2016  . Anemia 12/02/2016  . Abdominal pain   . Multiple myeloma ([place]) 04/30/2016  . Alcoholic pancreatitis 37/35/7897  . Complication of dialysis access insertion 05/17/2014  . ESRD (end stage renal disease) ([place])   . Atrial fibrillation ([place])   . Hypertension   . Pulmonary hypertension ([place])   . Mitral regurgitation   . Tobacco abuse   . Dyslipidemia   . Lower gastrointestinal bleed   . Pancytopenia ([place])   . Ejection fraction   . Diastolic dysfunction   . LVH (left ventricular hypertrophy)    PCP:  [name], MD Pharmacy:   [facility] (ESRD Bundle Only) - [place], [address] [address] Phone: [phone] Fax: [place] [address], [place] [address] Phone: [phone] Fax: [phone]     Social Determinants of Health (SDOH) Interventions    Readmission Risk Interventions No flowsheet data found.

## 2020-02-23 NOTE — Consult Note (Addendum)
ESRD Consult Note  Requesting provider: Irwin Brakeman Service requesting consult: Hospitalist Reason for consult: ESRD, provision of dialysis Indication for acute dialysis?: End Stage Renal Disease  Outpatient dialysis unit: [facility] Outpatient dialysis schedule: MWF  Assessment/Recommendations: Brian Liu is a/an 69 y.o. male with a past medical history notable for ESRD on HD admitted with dysphagia, COVID, and hypotension.   # ESRD: outpt prescription: 3.25hrs, 2K, 2.5Ca, Na 138 (will use 137), Bicarbonate 37 (will use 35), 35.5C, Dialyzer: F180 in place of gambro revaclear 300, holding home heparin 3000 unit bolus (can add back if needed)   # Volume/ hypotension: EDW 82kg. Often leaving dialysis with weight of 81kg. Given hypotension and no signs of volume overload will run even today. Start back midodrine when able to tolerate PO  # Anemia of Chronic Kidney Disease: Hemoglobin 8.4. Currently receiving no ESAs. Likely due to myeloma history.   # Secondary Hyperparathyroidism/Hyperphosphatemia: On hecterol 0.611mg MWF and sensipar 371mnightly. Also auryxia 1 tablet 3x a day. Restart sensipar and auryxia when tolerating PO. Zemplar 11m74mfor hecterol.  # Vascular access: Recently placed AVF. Use right tunneled CVC  # Hospital problem: Dysphagia: recent work up at OSH negative. Possibly had undetectable stroke that is making it difficult to swallow. Defer mgmt to primary team. Hypotension: multifactorial but hopefully will improve when midodrine restarted. Norepi per primary team for now.  # Additional recommendations: - Dose all meds for creatinine clearance < 10 ml/min  - Unless absolutely necessary, no MRIs with gadolinium.  - Implement save arm precautions.  Prefer needle sticks in the dorsum of the hands or wrists.  No blood pressure measurements in arm. - If blood transfusion is requested during hemodialysis sessions, please alert us [place] to the session.    Recommendations were discussed with the primary team.   History of Present Illness: Brian Liu a/an 69 85o. male with a past medical history of ESRD who presents with dysphagia as well as hypotension and asymptomatic covid infection.  Patient presented to the hospital on 02/22/2020 with difficulties swallowing and pain with swallowing.  Patient had been seen on 02/17/2020 for aphasia and left-sided weakness.  He was evaluated by neurology during that time and [facility]  During this admission he had difficulty swallowing.  Reportedly had an endoscopy and was told he may need to have of procedure again in the future but it appears his EGD was fairly normal.  His stroke work-up was also negative.  He was discharged the next day but his symptoms worsened and he came back to the hospital.  He also endorses some mild aspiration and cough.  Patient states he has not had dialysis since Saturday when he was at the hospital.  He has had difficulty maintaining a dialysis regimen lately because of his abnormal symptoms.  [facility].  He has had hypotension in the past but takes midodrine and is able to tolerate his dialysis.  He has had multiple access issues in the past and currently has a tunneled dialysis catheter that was placed on 12/28/2019.  He also had a right upper extremity aVF placed with banding of his prior graft on 02/17/2020.  In the emergency department his labs were generally reassuring.  He was noted to have hypotension but was asymptomatic.  He is typically on midodrine but these medications have been held because  of his dysphagia.  Is also found to have Covid but denied significant Covid symptoms.  He had been negative on 02/17/2020.  He does not know of any contacts.   Medications:  Current Facility-Administered Medications  Medication Dose  Route Frequency Provider Last Rate Last Admin  . 0.9 %  sodium chloride infusion  250 mL Intravenous Continuous [place], Gagan S, MD      . 0.9 %  sodium chloride infusion  250 mL Intravenous PRN Darrick Meigs, Marge Duncans, MD      . Chlorhexidine Gluconate Cloth 2 % PADS 6 each  6 each Topical Daily Johnson, Clanford L, MD      . Chlorhexidine Gluconate Cloth 2 % PADS 6 each  6 each Topical Q0600 Reesa Chew, MD      . heparin injection 5,000 Units  5,000 Units Subcutaneous Q8H Oswald Hillock, MD   5,000 Units at 02/23/20 [phone]  . norepinephrine (LEVOPHED) 55m in 2527mpremix infusion  2-10 mcg/min Intravenous Titrated LaOswald HillockMD 11.25 mL/hr at 02/23/20 0809 3 mcg/min at 02/23/20 0809  . ondansetron (ZOFRAN) tablet 4 mg  4 mg Oral Q6H PRN LaOswald HillockMD       Or  . ondansetron (ZOFRAN) injection 4 mg  4 mg Intravenous Q6H PRN LaDarrick MeigsGaMarge DuncansMD      . sodium chloride flush (NS) 0.9 % injection 3 mL  3 mL Intravenous Q12H Lama, GaMarge DuncansMD      . sodium chloride flush (NS) 0.9 % injection 3 mL  3 mL Intravenous PRN LaOswald HillockMD         ALLERGIES Hydrocodone  MEDICAL HISTORY Past Medical History:  Diagnosis Date  . Atrial fibrillation (HCC)    Permanent, Not on Coumadin because of history of lower GI bleed  . Diastolic dysfunction    Restrictive  diastolic filling pattern echo,  February, 2013  . Dyslipidemia   . Ejection fraction    EF 60-65%, echo, 2013  . ESRD (end stage renal disease) ([facility]   Dialysis, fistula in left arm, M-W-F  . Glaucoma   . Hypertension   . Lower gastrointestinal bleed   . LVH (left ventricular hypertrophy)   . 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 Mitral regurgitation    Mild by echo, 2013  . Multiple myeloma ([facility]/[place]/2017  . Pancytopenia ([facility]   Dr DrIgnacia Bayley. Pulmonary hypertension (HCC)    Severe, PA pressure 65-70 mm of mercury, echo, 2013  . Tobacco abuse      SOCIAL HISTORY Social History   Socioeconomic  History  . Marital status: Widowed    Spouse name: Not on file  . Number of children: Not on file  . Years of education: Not on file  . Highest education level: Not on file  Occupational History  . Not on file  Tobacco Use  . Smoking status: Never Smoker  . Smokeless tobacco: Never Used  Vaping Use  . Vaping Use: Never used  Substance and Sexual Activity  . Alcohol use: No    Alcohol/week: 0.0 standard drinks  . Drug use: No  . Sexual activity: Not on file  Other Topics Concern  . Not on file  Social History Narrative   Lives alone in a one story home.  Has 2 sons.     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     Education high school.  Social Determinants of Health   Financial Resource Strain:   . Difficulty of Paying Living Expenses:   Food Insecurity:   . Worried About Charity fundraiser in the Last Year:   . Arboriculturist in the Last Year:   Transportation Needs:   . Film/video editor (Medical):   [name] Lack of Transportation (Non-Medical):   Physical Activity:   . Days of Exercise per Week:   . Minutes of Exercise per Session:   Stress:   . Feeling of Stress :   Social Connections:   . Frequency of Communication with Friends and Family:   . Frequency of Social Gatherings with Friends and Family:   . Attends Religious Services:   . Active Member of Clubs or Organizations:   . Attends Archivist Meetings:   [name] Marital Status:   Intimate Partner Violence:   . Fear of Current or Ex-Partner:   . Emotionally Abused:   [name] Physically Abused:   . Sexually Abused:      FAMILY HISTORY Family History  Problem Relation Age of Onset  . Other Mother        Deceased, 26  . Stomach cancer Father        Deceased, 57  . Kidney failure Son   . Healthy Son      Review of Systems: 12 systems were reviewed and negative except per HPI  Physical Exam: Vitals:   02/23/20 0630 02/23/20 0748  BP: (!) 85/43   Pulse:     Resp: (!) 7 14  Temp:  97.9 F (36.6 C)  SpO2:     No intake/output data recorded.  Intake/Output Summary (Last 24 hours) at 02/23/2020 9499 Last data filed at 02/23/2020 0645 Gross per 24 hour  Intake 76.59 ml  Output --  Net 76.59 ml   General: well-appearing, no acute distress HEENT: anicteric sclera, dry mucous membranes CV: normal rate, no edema, Right tunneled CVC in place Lungs: bilateral chest rise, normal wob Abd: soft, non-tender, non-distended Skin: no visible lesions or rashes Psych: alert, engaged, appropriate mood and affect Neuro: normal speech, no gross focal deficits   Test Results Reviewed Lab Results  Component Value Date   NA 140 02/23/2020   K 4.2 02/23/2020   CL 99 02/23/2020   CO2 24 02/23/2020   BUN 54 (H) 02/23/2020   CREATININE 9.62 (H) 02/23/2020   CALCIUM 8.0 (L) 02/23/2020   ALBUMIN 3.0 (L) 02/23/2020   PHOS 7.3 (H) 12/25/2016    I have reviewed relevant outside healthcare records

## 2020-02-23 NOTE — Progress Notes (Signed)
ASSUMPTION OF CARE NOTE   02/23/2020 11:08 AM  Brian Liu was seen and examined.  The H&P by the admitting provider, orders, imaging was reviewed.  Please see new orders.  Will continue to follow.  I consulted nephrology for HD treatment.  Weaning off levophed infusion.  SLP evaluation and GI consult pending.  He is asymptomatic from Covid at this time.  He says he was fully vaccinated.   Vitals:   02/23/20 0900 02/23/20 1000  BP: (!) 124/94 (!) 156/45  Pulse: (!) 44   Resp: 20 10  Temp:    SpO2: (!) 75%     Results for orders placed or performed during the hospital encounter of 02/22/20  SARS Coronavirus 2 by RT PCR (hospital order, performed in [place] hospital lab) Nasopharyngeal Nasopharyngeal Swab   Specimen: Nasopharyngeal Swab  Result Value Ref Range   SARS Coronavirus 2 POSITIVE (A) NEGATIVE  MRSA PCR Screening   Specimen: Nasal Mucosa; Nasopharyngeal  Result Value Ref Range   MRSA by PCR NEGATIVE NEGATIVE  Comprehensive metabolic panel  Result Value Ref Range   Sodium 137 135 - 145 mmol/L   Potassium 3.9 3.5 - 5.1 mmol/L   Chloride 99 98 - 111 mmol/L   CO2 23 22 - 32 mmol/L   Glucose, Bld 71 70 - 99 mg/dL   BUN 51 (H) 8 - 23 mg/dL   Creatinine, Ser 9.30 (H) 0.61 - 1.24 mg/dL   Calcium 7.7 (L) 8.9 - 10.3 mg/dL   Total Protein 6.3 (L) 6.5 - 8.1 g/dL   Albumin 3.0 (L) 3.5 - 5.0 g/dL   AST 11 (L) 15 - 41 U/L   ALT <5 0 - 44 U/L   Alkaline Phosphatase 50 38 - 126 U/L   Total Bilirubin 0.8 0.3 - 1.2 mg/dL   GFR calc non Af Amer 5 (L) >60 mL/min   GFR calc Af Amer 6 (L) >60 mL/min   Anion gap 15 5 - 15  CBC with Differential  Result Value Ref Range   WBC 4.5 4.0 - 10.5 K/uL   RBC 2.97 (L) 4.22 - 5.81 MIL/uL   Hemoglobin 8.1 (L) 13.0 - 17.0 g/dL   HCT 26.1 (L) 39 - 52 %   MCV 87.9 80.0 - 100.0 fL   MCH 27.3 26.0 - 34.0 pg   MCHC 31.0 30.0 - 36.0 g/dL   RDW 15.3 11.5 - 15.5 %   Platelets 101 (L) 150 - 400 K/uL   nRBC 0.0 0.0 - 0.2 %   Neutrophils  Relative % 63 %   Neutro Abs 2.9 1.7 - 7.7 K/uL   Lymphocytes Relative 22 %   Lymphs Abs 1.0 0.7 - 4.0 K/uL   Monocytes Relative 10 %   Monocytes Absolute 0.4 0 - 1 K/uL   Eosinophils Relative 4 %   Eosinophils Absolute 0.2 0 - 0 K/uL   Basophils Relative 1 %   Basophils Absolute 0.0 0 - 0 K/uL   Immature Granulocytes 0 %   Abs Immature Granulocytes 0.00 0.00 - 0.07 K/uL  CBC  Result Value Ref Range   WBC 4.2 4.0 - 10.5 K/uL   RBC 3.03 (L) 4.22 - 5.81 MIL/uL   Hemoglobin 8.4 (L) 13.0 - 17.0 g/dL   HCT 27.0 (L) 39 - 52 %   MCV 89.1 80.0 - 100.0 fL   MCH 27.7 26.0 - 34.0 pg   MCHC 31.1 30.0 - 36.0 g/dL   RDW 15.4 11.5 - 15.5 %  Platelets 98 (L) 150 - 400 K/uL   nRBC 0.0 0.0 - 0.2 %  Comprehensive metabolic panel  Result Value Ref Range   Sodium 140 135 - 145 mmol/L   Potassium 4.2 3.5 - 5.1 mmol/L   Chloride 99 98 - 111 mmol/L   CO2 24 22 - 32 mmol/L   Glucose, Bld 62 (L) 70 - 99 mg/dL   BUN 54 (H) 8 - 23 mg/dL   Creatinine, Ser 9.62 (H) 0.61 - 1.24 mg/dL   Calcium 8.0 (L) 8.9 - 10.3 mg/dL   Total Protein 6.4 (L) 6.5 - 8.1 g/dL   Albumin 3.0 (L) 3.5 - 5.0 g/dL   AST 11 (L) 15 - 41 U/L   ALT <5 0 - 44 U/L   Alkaline Phosphatase 50 38 - 126 U/L   Total Bilirubin 0.8 0.3 - 1.2 mg/dL   GFR calc non Af Amer 5 (L) >60 mL/min   GFR calc Af Amer 6 (L) >60 mL/min   Anion gap 17 (H) 5 - 15   Time Spent: 34 minutes   Murvin Natal, MD [facility]   02/22/2020  6:14 PM How to contact the [facility] Attending or Consulting provider [place] or covering provider during after hours 7P -7A, for this patient?  1. Check the care team in [facility] and look for a) attending/consulting TRH provider listed and b) the [facility] team listed 2. Log into [email or web address] and use [place]'s universal password to access. If you do not have the password, please contact the hospital operator. 3. Locate the [facility] provider you are looking for under [facility] and page to a number that you can be directly  reached. 4. If you still have difficulty reaching the provider, please page the [facility] (Director on Call) for the Hospitalists listed on amion for assistance.

## 2020-02-23 NOTE — Progress Notes (Signed)
Inpatient Diabetes Program Recommendations  AACE/ADA: New Consensus Statement on Inpatient Glycemic Control (2015)  Target Ranges:  Prepandial:   less than 140 mg/dL      Peak postprandial:   less than 180 mg/dL (1-2 hours)      Critically ill patients:  140 - 180 mg/dL   Lab Results  Component Value Date   GLUCAP 83 12/28/2019    Review of Glycemic Control  Diabetes history: no noted hx Outpatient Diabetes medications: none Current orders for Inpatient glycemic control: none  Inpatient Diabetes Program Recommendations:    With mildly low serum glucose this AM, may want to consider CBGs TID & HS.   Thanks, Bronson Curb, MSN, RNC-OB Diabetes Coordinator [phone] (8a-5p)

## 2020-02-23 NOTE — ED Notes (Signed)
[place]

## 2020-02-24 DIAGNOSIS — E785 Hyperlipidemia, unspecified: Secondary | ICD-10-CM

## 2020-02-24 DIAGNOSIS — I10 Essential (primary) hypertension: Secondary | ICD-10-CM

## 2020-02-24 DIAGNOSIS — I5189 Other ill-defined heart diseases: Secondary | ICD-10-CM

## 2020-02-24 DIAGNOSIS — R1312 Dysphagia, oropharyngeal phase: Principal | ICD-10-CM

## 2020-02-24 DIAGNOSIS — I4821 Permanent atrial fibrillation: Secondary | ICD-10-CM

## 2020-02-24 DIAGNOSIS — U071 COVID-19: Secondary | ICD-10-CM

## 2020-02-24 DIAGNOSIS — C9000 Multiple myeloma not having achieved remission: Secondary | ICD-10-CM

## 2020-02-24 DIAGNOSIS — I272 Pulmonary hypertension, unspecified: Secondary | ICD-10-CM

## 2020-02-24 LAB — RENAL FUNCTION PANEL
Albumin: 2.9 g/dL — ABNORMAL LOW (ref 3.5–5.0)
Anion gap: 15 (ref 5–15)
BUN: 21 mg/dL (ref 8–23)
CO2: 25 mmol/L (ref 22–32)
Calcium: 8.3 mg/dL — ABNORMAL LOW (ref 8.9–10.3)
Chloride: 96 mmol/L — ABNORMAL LOW (ref 98–111)
Creatinine, Ser: 5.17 mg/dL — ABNORMAL HIGH (ref 0.61–1.24)
GFR calc Af Amer: 12 mL/min — ABNORMAL LOW (ref >60)
GFR calc non Af Amer: 11 mL/min — ABNORMAL LOW (ref >60)
Glucose, Bld: 98 mg/dL (ref 70–99)
Phosphorus: 3.3 mg/dL (ref 2.5–4.6)
Potassium: 3.9 mmol/L (ref 3.5–5.1)
Sodium: 136 mmol/L (ref 135–145)

## 2020-02-24 LAB — CBC
HCT: 29.1 % — ABNORMAL LOW (ref 39.0–52.0)
Hemoglobin: 8.9 g/dL — ABNORMAL LOW (ref 13.0–17.0)
MCH: 27.3 pg (ref 26.0–34.0)
MCHC: 30.6 g/dL (ref 30.0–36.0)
MCV: 89.3 fL (ref 80.0–100.0)
Platelets: 112 10*3/uL — ABNORMAL LOW (ref 150–400)
RBC: 3.26 MIL/uL — ABNORMAL LOW (ref 4.22–5.81)
RDW: 14.8 % (ref 11.5–15.5)
WBC: 5.1 10*3/uL (ref 4.0–10.5)
nRBC: 0 % (ref 0.0–0.2)

## 2020-02-24 LAB — MAGNESIUM: Magnesium: 2.1 mg/dL (ref 1.7–2.4)

## 2020-02-24 LAB — GLUCOSE, CAPILLARY
Glucose-Capillary: 88 mg/dL (ref 70–99)
Glucose-Capillary: 72 mg/dL (ref 70–99)
Glucose-Capillary: 71 mg/dL (ref 70–99)
Glucose-Capillary: 59 mg/dL — ABNORMAL LOW (ref 70–99)
Glucose-Capillary: 98 mg/dL (ref 70–99)

## 2020-02-24 MED ORDER — INSULIN ASPART 100 UNIT/ML ~~LOC~~ SOLN: 0.0000 [IU] | Freq: Three times a day (TID) | SUBCUTANEOUS | Status: DC

## 2020-02-24 NOTE — Evaluation (Signed)
Clinical/Bedside Swallow Evaluation Patient Details  Name: Brian Liu MRN: [id number] Date of Birth: 1950-08-12  Today's Date: 02/23/2020 Time: SLP Start Time (ACUTE ONLY): 1062 SLP Stop Time (ACUTE ONLY): 1318 SLP Time Calculation (min) (ACUTE ONLY): 26 min  Past Medical History:  Past Medical History:  Diagnosis Date  . Atrial fibrillation (HCC)    Permanent, Not on Coumadin because of history of lower GI bleed  . Diastolic dysfunction    Restrictive  diastolic filling pattern echo,  February, 2013  . Dyslipidemia   . Ejection fraction    EF 60-65%, echo, 2013  . ESRD (end stage renal disease) ([place])    Dialysis, fistula in left arm, M-W-F  . Glaucoma   . Hypertension   . Lower gastrointestinal bleed   . LVH (left ventricular hypertrophy)   . 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 Mitral regurgitation    Mild by echo, 2013  . Multiple myeloma ([place]) 04/30/2016  . Pancytopenia ([place])    Dr Ignacia Bayley  . Pulmonary hypertension (HCC)    Severe, PA pressure 65-70 mm of mercury, echo, 2013  . Tobacco abuse    Past Surgical History:  Past Surgical History:  Procedure Laterality Date  . A/V FISTULAGRAM Left 04/03/2017   Procedure: A/V Fistulagram;  Surgeon: Conrad Forest, MD;  Location: [place] [facility];  Service: Cardiovascular;  Laterality: Left;  . A/V FISTULAGRAM Left 08/06/2018   Procedure: A/V FISTULAGRAM;  Surgeon: [name], MD;  Location: [place] [facility];  Service: Cardiovascular;  Laterality: Left;  . AV FISTULA PLACEMENT Left 1992   Left arm   . AV FISTULA PLACEMENT Right 02/17/2020   Procedure: INSERTION OF RIGHT ARM  ARTERIOVENOUS (AV)  GRAFT;  Surgeon: [name], MD;  Location: [place];  Service: Vascular;  Laterality: Right;  . IR FLUORO GUIDE CV LINE RIGHT  06/23/2018  . IR FLUORO GUIDE CV LINE RIGHT  12/28/2019  . IR FLUORO GUIDE CV LINE RIGHT  01/20/2020  . IR PTA VENOUS EXCEPT DIALYSIS CIRCUIT  01/20/2020  . IR  REMOVAL TUN CV CATH W/O FL  08/20/2018  . IR US GUIDE VASC ACCESS RIGHT  06/23/2018  . IR US GUIDE VASC ACCESS RIGHT  12/28/2019  . IR VENOCAVAGRAM SVC  01/20/2020  . 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 PERIPHERAL VASCULAR BALLOON ANGIOPLASTY Left 08/06/2018   Procedure: PERIPHERAL VASCULAR BALLOON ANGIOPLASTY;  Surgeon: [name], MD;  Location: [place] [facility];  Service: Cardiovascular;  Laterality: Left;   HPI:  Brian Liu  is a 69 y.o. male, with history of permanent atrial fibrillation not on Coumadin due to GI bleed, diastolic dysfunction, ESRD on hemodialysis MWF, hypertension, multiple myeloma, pulmonary hypertension who came to hospital with chief complaint of difficulty swallowing.  Patient was seen at [facility] on 02/16/2021 for expressive aphasia and left-sided weakness.  At that time he was evaluated by neurology but found not to be a TPA candidate because he just had vascular procedure getting dialysis fistula earlier that day.  Patient symptoms improved.  He ended up being admitted to [facility] facility in [place] for stroke work-up.  Patient was found to have difficulty swallowing, he had EGD  which was unremarkable.  I reviewed EGD report from [facility] and showed no significant abnormality.  His stroke work-up also was negative. BSE requested.   Assessment / Plan / Recommendation Clinical Impression  Late entry for yesterday (02/23/20). Pt reportedly had normal EGD at [facility] a few days ago, however he presents with signs of esophageal dysphagia with regurgitation and belching after the swallow, followed by delayed coughing. Pt has a history of laryngeal cancer, per Pt with some residual dysphagia  with exacerbation in the past few days. Will complete MBSS with results to follow, ok for ice chips and small sips of water following oral care.   SLP Visit Diagnosis: Dysphagia, unspecified (R13.10)    Aspiration Risk  Mild aspiration risk;Risk for inadequate nutrition/hydration    Diet Recommendation Free water protocol after oral care;Ice chips PRN after oral care;NPO except meds   Medication Administration: Crushed with puree    Other  Recommendations Recommended Consults: Consider GI evaluation Oral Care Recommendations: Oral care prior to ice chip/H20;Staff/trained caregiver to provide oral care   Follow up Recommendations  (pending)      Frequency and Duration min 2x/week  1 week       Prognosis Prognosis for Safe Diet Advancement: Fair Barriers to Reach Goals: Severity of deficits      Swallow Study   General Date of Onset: 02/22/20 HPI: Brian Liu  is a 69 y.o. male, with history of permanent atrial fibrillation not on Coumadin due to GI bleed, diastolic dysfunction, ESRD on hemodialysis MWF, hypertension, multiple myeloma, pulmonary hypertension who came to hospital with chief complaint of difficulty swallowing.  Patient was seen at [facility] on 02/16/2021 for expressive aphasia and left-sided weakness.  At that time he was evaluated by neurology but found not to be a TPA candidate because he just had vascular procedure getting dialysis fistula earlier that day.  Patient symptoms improved.  He ended up being admitted to [facility] facility in [place] for stroke work-up.  Patient was found to have difficulty swallowing, he had EGD which was unremarkable.  I reviewed EGD report from [facility] and showed no significant abnormality.  His stroke work-up also was negative. BSE requested. Type of Study: Bedside Swallow Evaluation Diet Prior to this Study: NPO Temperature Spikes Noted: No Respiratory Status: Nasal cannula History of Recent Intubation: No Behavior/Cognition:  Alert;Cooperative;Pleasant mood Oral Cavity Assessment: Within Functional Limits Oral Care Completed by SLP: No Oral Cavity - Dentition: Missing dentition;Adequate natural dentition Vision: Functional for self-feeding Self-Feeding Abilities: Able to feed self Patient Positioning: Upright in bed Baseline Vocal Quality: Normal Volitional Cough: Strong Volitional Swallow: Able to elicit    Oral/Motor/Sensory Function Overall Oral Motor/Sensory Function: Within functional limits   Ice Chips Ice chips: Impaired Presentation: Spoon Pharyngeal Phase Impairments: Multiple swallows   Thin Liquid Thin Liquid: Impaired Presentation: Cup;Self Fed;Spoon;Straw Pharyngeal  Phase Impairments: Multiple swallows;Cough - Delayed (regurgitation)    Nectar Thick Nectar Thick Liquid: Not tested   Honey Thick Honey Thick Liquid: Not tested   Puree Puree: Impaired Presentation: Spoon Pharyngeal Phase Impairments: Multiple swallows;Cough - Delayed (regurgitation sensation)   Solid     Solid: Impaired Presentation: Spoon Pharyngeal Phase Impairments: Multiple swallows;Cough - Delayed;Throat Clearing - Delayed     Thank you,  Genene Churn, CCC-SLP [phone]   (late entry for 02/23/20) Brian Liu 02/24/2020,10:54 AM

## 2020-02-24 NOTE — Progress Notes (Signed)
PROGRESS NOTE   Brian Liu  AST:[id number] DOB: [birth year] DOA: 02/22/2020 PCP: [name], MD   Chief Complaint  Patient presents with  . unable to swallow    Brief Admission History:   69 y.o. male, with history of permanent atrial fibrillation not on Coumadin due to GI bleed, diastolic dysfunction, ESRD on hemodialysis MWF, hypertension, multiple myeloma, pulmonary hypertension who came to hospital with chief complaint of difficulty swallowing.  Patient was seen at [facility] on 02/16/2021 for expressive aphasia and left-sided weakness.  At that time he was evaluated by neurology but found not to be a TPA candidate because he just had vascular procedure getting dialysis fistula earlier that day.  Patient symptoms improved.  He ended up being admitted to [facility] facility in [place] for stroke work-up.  Patient was found to have difficulty swallowing, he had EGD which was unremarkable.  I reviewed EGD report from [facility] and showed no significant abnormality.  His stroke work-up also was negative.  He was discharged with diet recommendation for thickened foods.  As per patient what ever he tries to swallow comes right back up. He denies chest pain or shortness of breath.  Denies nausea, vomiting or diarrhea.  Assessment & Plan:   Active Problems:   Hypertension   Pulmonary hypertension (HCC)   Dyslipidemia   Diastolic dysfunction   ESRD (end stage renal disease) ([place])   Atrial fibrillation (HCC)   Multiple myeloma (HCC)   ESRD on hemodialysis ([place])   Dysphagia   COVID-19 virus infection   MGUS (monoclonal gammopathy of unknown significance)   1. 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 SLP eval appreciated.  They are recommending outpatient speech therapy to continue working with him.  The only other alternative would be PEG placement.  2. Covid 19 - asymptomatic infection, good oxygen saturation on room air.   3. ESRD on HD - He  tolerated HD 7/21 and planning for HD tomorrow per nephrology team.  4. Hypotension - chronic - he is on midodrine.  He has been weaned of levophed.  5. Permanent atrial fibrillation - rate is well controlled, he is not anticoagulated due to history of severe GI bleeding.   DVT prophylaxis: sQ heparin  Code Status: full  Family Communication:  Disposition:   Status is: Inpatient  Remains inpatient appropriate because:IV treatments appropriate due to intensity of illness or inability to take PO and Inpatient level of care appropriate due to severity of illness  Dispo: The patient is from: Home              Anticipated d/c is to: Home              Anticipated d/c date is: 1 day              Patient currently is not medically stable to d/c.  Consultants:   GI, SLP   Procedures:   MBS  Antimicrobials:     Subjective: Pt upset that he can't go home   Objective: Vitals:   02/24/20 1200 02/24/20 1230 02/24/20 1300 02/24/20 1430  BP: (!) 118/61 (!) 108/50 (!) 97/53   Pulse:      Resp: '18 20 18   ' Temp:      TempSrc:      SpO2: 100%  100% 100%  Weight:      Height:        Intake/Output Summary (Last 24 hours) at 02/24/2020 1512 Last data filed  at 02/24/2020 1020 Gross per 24 hour  Intake 280.87 ml  Output 40 ml  Net 240.87 ml   Filed Weights   02/22/20 1812 02/23/20 0426 02/23/20 1700  Weight: 78 kg 61.6 kg 63.6 kg    Examination:  General exam: Appears calm and comfortable  Respiratory system: Clear to auscultation. Respiratory effort normal. Cardiovascular system: S1 & S2 heard, RRR. No JVD, murmurs, rubs, gallops or clicks. No pedal edema. Gastrointestinal system: Abdomen is nondistended, soft and nontender. No organomegaly or masses felt. Normal bowel sounds heard. Central nervous system: Alert and oriented. No focal neurological deficits. Extremities: Symmetric 5 x 5 power. Skin: No rashes, lesions or ulcers Psychiatry: Judgement and insight appear normal.  Mood & affect appropriate.   Data Reviewed: I have personally reviewed following labs and imaging studies  CBC: Recent Labs  Lab 02/22/20 2304 02/23/20 0520 02/24/20 0515  WBC 4.5 4.2 5.1  NEUTROABS 2.9  --   --   HGB 8.1* 8.4* 8.9*  HCT 26.1* 27.0* 29.1*  MCV 87.9 89.1 89.3  PLT 101* 98* 112*    Basic Metabolic Panel: Recent Labs  Lab 02/22/20 2304 02/23/20 0520 02/24/20 0515  NA 137 140 136  K 3.9 4.2 3.9  CL 99 99 96*  CO2 '23 24 25  ' GLUCOSE 71 62* 98  BUN 51* 54* 21  CREATININE 9.30* 9.62* 5.17*  CALCIUM 7.7* 8.0* 8.3*  MG  --   --  2.1  PHOS  --   --  3.3    GFR: Estimated Creatinine Clearance: 12.3 mL/min (A) (by C-G formula based on SCr of 5.17 mg/dL (H)).  Liver Function Tests: Recent Labs  Lab 02/22/20 2304 02/23/20 0520 02/24/20 0515  AST 11* 11*  --   ALT <5 <5  --   ALKPHOS 50 50  --   BILITOT 0.8 0.8  --   PROT 6.3* 6.4*  --   ALBUMIN 3.0* 3.0* 2.9*    CBG: Recent Labs  Lab 02/23/20 2220 02/23/20 2332 02/24/20 0401 02/24/20 0835 02/24/20 1114  GLUCAP 91 91 88 72 71    Recent Results (from the past 240 hour(s))  SARS CORONAVIRUS 2 (TAT 6-24 HRS) Nasopharyngeal Nasopharyngeal Swab     Status: None   Collection Time: 02/15/20 11:43 AM   Specimen: Nasopharyngeal Swab  Result Value Ref Range Status   SARS Coronavirus 2 NEGATIVE NEGATIVE Final    Comment: (NOTE) SARS-CoV-2 target nucleic acids are NOT DETECTED.  The SARS-CoV-2 RNA is generally detectable in upper and lower respiratory specimens during the acute phase of infection. Negative results do not preclude SARS-CoV-2 infection, do not rule out co-infections with other pathogens, and should not be used as the sole basis for treatment or other patient management decisions. Negative results must be combined with clinical observations, patient history, and epidemiological information. The expected result is Negative.  Fact Sheet for  Patients: SugarRoll.be  Fact Sheet for Healthcare Providers: https://www.woods-mathews.com/  This test is not yet approved or cleared by the [place] FDA and  has been authorized for detection and/or diagnosis of SARS-CoV-2 by FDA under an Emergency Use Authorization (EUA). This EUA will remain  in effect (meaning this test can be used) for the duration of the COVID-19 declaration under Se ction 564(b)(1) of the Act, 21 U.S.C. section 360bbb-3(b)(1), unless the authorization is terminated or revoked sooner.  Performed at [facility], [place] [address], [place], [place] [address]   SARS Coronavirus 2 by RT PCR (hospital order,  performed in [facility] hospital lab) Nasopharyngeal Nasopharyngeal Swab     Status: Abnormal   Collection Time: 02/22/20  9:05 PM   Specimen: Nasopharyngeal Swab  Result Value Ref Range Status   SARS Coronavirus 2 POSITIVE (A) NEGATIVE Final    Comment: RESULT CALLED TO, READ BACK BY AND VERIFIED WITH: K NICHOLS,RN'@0002'  02/23/20 MKELLY (NOTE) SARS-CoV-2 target nucleic acids are DETECTED  SARS-CoV-2 RNA is generally detectable in upper respiratory specimens  during the acute phase of infection.  Positive results are indicative  of the presence of the identified virus, but do not rule out bacterial infection or co-infection with other pathogens not detected by the test.  Clinical correlation with patient history and  other diagnostic information is necessary to determine patient infection status.  The expected result is negative.  Fact Sheet for Patients:   StrictlyIdeas.no   Fact Sheet for Healthcare Providers:   BankingDealers.co.za    This test is not yet approved or cleared by the [place] FDA and  has been authorized for detection and/or diagnosis of SARS-CoV-2 by FDA under an Emergency Use Authorization (EUA).  This EUA will remain in effect  (meaning this tes t can be used) for the duration of  the COVID-19 declaration under Section 564(b)(1) of the Act, 21 U.S.C. section 360-bbb-3(b)(1), unless the authorization is terminated or revoked sooner.  Performed at [facility], [address], [place], [place] [address]   MRSA PCR Screening     Status: None   Collection Time: 02/23/20  4:12 AM   Specimen: Nasal Mucosa; Nasopharyngeal  Result Value Ref Range Status   MRSA by PCR NEGATIVE NEGATIVE Final    Comment:        The GeneXpert MRSA Assay (FDA approved for NASAL specimens only), is one component of a comprehensive MRSA colonization surveillance program. It is not intended to diagnose MRSA infection nor to guide or monitor treatment for MRSA infections. Performed at [facility], [address], [place], [place] [address]      Radiology Studies: [facility] 1 View  Result Date: 02/23/2020 CLINICAL DATA:  Cough and inability to swallow.  COVID-19 positive. EXAM: PORTABLE CHEST 1 VIEW COMPARISON:  07/10/2018 FINDINGS: A right jugular dialysis catheter remains in place terminating over the high right atrium. The cardiac silhouette remains mildly enlarged. Aortic atherosclerosis is noted. The lungs are hyperinflated. Mildly accentuated interstitial markings bilaterally are similar to the prior study. No confluent airspace opacity, edema, sizable pleural effusion, or pneumothorax is identified. Calcified pleural plaques are noted. Oral contrast material is partially visualized in the colon. A right shoulder arthroplasty is noted. IMPRESSION: Chronic findings without evidence of acute cardiopulmonary disease. Electronically Signed   By: Logan Bores M.D.   On: 02/23/2020 08:11     Scheduled Meds: . Chlorhexidine Gluconate Cloth  6 each Topical Daily  . Chlorhexidine Gluconate Cloth  6 each Topical Q0600  . cinacalcet  30 mg Oral QPC supper  . dorzolamide-timolol  1 drop Both Eyes BID  . heparin  5,000 Units Subcutaneous  Q8H  . insulin aspart  0-6 Units Subcutaneous TID WC  . latanoprost  1 drop Both Eyes QHS  . [START ON 02/26/2020] levothyroxine  68.5 mcg Intravenous Daily  . midodrine  15 mg Oral BID  . mirtazapine  7.5 mg Oral QHS  . simvastatin  20 mg Oral q1800  . sodium chloride flush  3 mL Intravenous Q12H   Continuous Infusions: . sodium chloride    . sodium  chloride    . sodium chloride    . sodium chloride       LOS: 1 day   Critical Care Procedure Note Authorized and Performed by: Murvin Natal MD  Total Critical Care time:  36 mins  Due to a high probability of clinically significant, life threatening deterioration, the patient required my highest level of preparedness to intervene emergently and I personally spent this critical care time directly and personally managing the patient.  This critical care time included obtaining a history; examining the patient, pulse oximetry; ordering and review of studies; arranging urgent treatment with development of a management plan; evaluation of patient's response of treatment; frequent reassessment; and discussions with other providers.  This critical care time was performed to assess and manage the high probability of imminent and life threatening deterioration that could result in multi-organ failure.  It was exclusive of separately billable procedures and treating other patients and teaching time.     Irwin Brakeman, MD How to contact the [facility] Attending or Consulting provider [place] or covering provider during after hours [place], for this patient?  1. Check the care team in [facility] and look for a) attending/consulting TRH provider listed and b) the [facility] team listed 2. Log into [email or web address] and use [place]'s universal password to access. If you do not have the password, please contact the hospital operator. 3. Locate the [facility] provider you are looking for under [facility] and page to a number that you can be directly reached. 4. If you still  have difficulty reaching the provider, please page the [facility] (Director on Call) for the Hospitalists listed on amion for assistance.  02/24/2020, 3:12 PM

## 2020-02-24 NOTE — Progress Notes (Signed)
Speech Language Pathology Treatment: Dysphagia  Patient Details Name: Brian Liu MRN: [id number] DOB: 1950/08/31 Today's Date: 02/24/2020 Time: 1230-1300 SLP Time Calculation (min) (ACUTE ONLY): 30 min    MBSS from [place] on 02/18/20 <<The oral phase of the swallow was largely [facility]. Mildly increased mastication time observed with soft solid. A moderate to severe pharyngeal dysphagia was c/b reduced laryngeal elevation/hyolaryngeal excursion, reduced laryngeal vestibule closure, reduced tongue base retraction, reduced epiglottic inversion, reduced pharyngeal stripping, and decreased UES opening. In addition, the pharynx appears narrowed starting at ~ C4, which contributed to pharyngeal residue and reduced clearance of the bolus. 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 Further esophageal dysphagia is questioned given pt's c/o "it won't go down," with soft solid. Suspected retrograde flow of thin liquids and aspiration occurred x1 during the study (but was not visualized under direct fluoro) secondary to pt's coughing and an increase of material at the level of the pyriform sinuses. Retrograde flow of a puree bolus was seen in the cervical esophagus x1. Penetration and aspiration was observed with all trialed consistencies of thin, nectar, puree, and soft solid. The pt was unable to completely clear pharyngeal residue despite multiple swallows and/or a liquid wash. The pt denies a previous dysphagia history until ~ 2 weeks ago. An education session was completed with the pt post MBS regarding findings and swallow physiology. All questions were answered to his satisfaction. Per discussion with Dr. Michaelle Birks, Neurologist, the pt has had no acute neurological event to explain an onset of dysphagia and it was presumed that the pt's dysphagia is chronic in nature. Dr. Michaelle Birks is to speak with the pt regarding [place] regarding initiation of a PO diet despite  aspiration risks. Of note, the pt could benefit from seeing GI for further evaluation of esophageal function. Prognosis: Fair Prognosis Considerations: Co-morbidities;Previous level of function Recommendations: Nutritional Recommendation: NPO(Pending discussion with MD regarding pt's [place]; if PO diet chosen, recommend puree and nectar as this appeared the safest per MBS imaging) Recommended Form of Meds: (if PO diet, recommend crushed with apple sauce) Compensatory Swallowing Strategies: Alternate foods and liquids;Eat/feed slowly;Multiple swallows per bite/sip;Small, single bites/sips;Feed only when fully awake/alert;Remain upright 20-30 min after eating;Upright as possible for oral intake;Reflux precautions;No straws Continued ST Needs: ST to re-assess as patient appropriate(pending GOC) Other SLP Recommendations: GI consult Results and recommendations discussed with pt and Dr. Michaelle Birks.>>   Assessment / Plan / Recommendation Clinical Impression  Ongoing diagnostic dysphagia intervention provided. MBSS completed last week reviewed. Pt continues to present with signs of pharyngeal and suspected esophageal dysphagia characterized by multiple swallows elicited followed by delayed throat clearing and delayed coughing. Pt with subjective reports of globus ("It goes down to a certain point and then it comes back up"). Please see MBSS above from last week. Pt's clinical presentation is consistent with finding from that study. Recommend D1/puree and HTL via small cup sips (Pt reported subjective improvement/ease with HTL vs NTL/thin today). 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 Pt may benefit from a laryngology appointment at [facility] (Dr. Rowe Clack) to see if dilation near UES would be beneficial. Also recommend either outpatient or [facility] SLP services for dysphagia to provide additional support to Pt and family. I spoke with Pt's son, Brian Liu,  on the phone today. He inquired whether balloon dilation would be beneficial, as someone at [facility] mentioned during his dad's  stay. Pt had EGD after the MBSS which was reportedly unremarkable. SLP will follow during acute stay. Above to RN and MD.    HPI HPI: Brian Liu  is a 69 y.o. male, with history of permanent atrial fibrillation not on Coumadin due to GI bleed, diastolic dysfunction, ESRD on hemodialysis MWF, hypertension, multiple myeloma, pulmonary hypertension who came to hospital with chief complaint of difficulty swallowing.  Patient was seen at [facility] on 02/16/2021 for expressive aphasia and left-sided weakness.  At that time he was evaluated by neurology but found not to be a TPA candidate because he just had vascular procedure getting dialysis fistula earlier that day.  Patient symptoms improved.  He ended up being admitted to [facility] facility in [place] for stroke work-up.  Patient was found to have difficulty swallowing, he had EGD which was unremarkable.  I reviewed EGD report from [facility] and showed no significant abnormality.  His stroke work-up also was negative. BSE requested.      SLP Plan  Continue with current plan of care       Recommendations  Diet recommendations: Dysphagia 1 (puree);Honey-thick liquid Liquids provided via: Cup;No straw Medication Administration: Crushed with puree Supervision: Patient able to self feed;Intermittent supervision to cue for compensatory strategies Compensations: Slow rate;Small sips/bites;Multiple dry swallows after each bite/sip Postural Changes and/or Swallow Maneuvers: Seated upright 90 degrees;Upright 30-60 min after meal                Oral Care Recommendations: Oral care prior to ice chip/H20;Staff/trained caregiver to provide oral care Follow up Recommendations: Outpatient SLP;Home health SLP SLP Visit Diagnosis: Dysphagia, unspecified (R13.10) Plan: Continue with current plan of care       Thank you,  Genene Churn, [place]                 [place] 02/24/2020, 2:07 PM

## 2020-02-24 NOTE — Progress Notes (Signed)
Admit: 02/22/2020 LOS: 1  8M ESRD [facility] MWF with dysphagia, ASx COVID infection  Subjective:  . HD yesterday w/o UF . Still on low dose NE . Awake, alert, distressed but answers questions accurately   07/21 0701 - 07/22 0700 In: 272.2 [I.V.:272.2] Out: 40   Filed Weights   02/22/20 1812 02/23/20 0426 02/23/20 1700  Weight: 78 kg 61.6 kg 63.6 kg    Scheduled Meds: . Chlorhexidine Gluconate Cloth  6 each Topical Daily  . Chlorhexidine Gluconate Cloth  6 each Topical Q0600  . cinacalcet  30 mg Oral QPC supper  . dorzolamide-timolol  1 drop Both Eyes BID  . heparin  5,000 Units Subcutaneous Q8H  . insulin aspart  0-6 Units Subcutaneous Q4H  . latanoprost  1 drop Both Eyes QHS  . [START ON 02/26/2020] levothyroxine  68.5 mcg Intravenous Daily  . midodrine  15 mg Oral BID  . mirtazapine  7.5 mg Oral QHS  . simvastatin  20 mg Oral q1800  . sodium chloride flush  3 mL Intravenous Q12H   Continuous Infusions: . sodium chloride    . sodium chloride    . sodium chloride    . sodium chloride    . norepinephrine (LEVOPHED) Adult infusion 3 mcg/min (02/24/20 0619)   PRN Meds:.sodium chloride, sodium chloride, sodium chloride, alteplase, heparin, ondansetron **OR** ondansetron (ZOFRAN) IV, pentafluoroprop-tetrafluoroeth, sodium chloride flush  Current Labs: reviewed    Physical Exam:  Blood pressure 128/68, pulse 72, temperature 97.7 F (36.5 C), resp. rate 18, height 6\' 2"  (1.88 m), weight 63.6 kg, SpO2 100 %. Chronically ill-appearing, conversant NCAT Anicteric Clear bilaterally No rub, regular Right IJ TDC without erythema, purulence, tenderness No significant edema  outpt prescription: 3.25hrs, 2K, 2.5Ca, Na 138 (will use 137), Bicarbonate 37 (will use 35), 35.5C, Dialyzer: F180 in place of gambro revaclear 300, holding home heparin 3000 unit bolus (can add back if needed)   A 1. ESRD MWF [facility], on schedule:  2. Dysphagia per SLP and GI, eval in  process 3. Vol/BP: hypotension, lower EDW as able falling 2/2 #2 4. Anemia: Hb 8s, not on ESA, ? 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 Maturing RUE AVF 02/07/20 VVS  P . HD tomorrow on schedule: 2K, 3.5h, TDC 400/600, tight heparin, 1-2L UF SBP permitting . Medication Issues; o Preferred narcotic agents for pain control are hydromorphone, fentanyl, and methadone. Morphine should not be used.  o Baclofen should be avoided o Avoid oral sodium phosphate and magnesium citrate based laxatives / bowel preps    Pearson Grippe MD 02/24/2020, 9:14 AM  Recent Labs  Lab 02/22/20 2304 02/23/20 0520 02/24/20 0515  NA 137 140 136  K 3.9 4.2 3.9  CL 99 99 96*  CO2 23 24 25   GLUCOSE 71 62* 98  BUN 51* 54* 21  CREATININE 9.30* 9.62* 5.17*  CALCIUM 7.7* 8.0* 8.3*  PHOS  --   --  3.3   Recent Labs  Lab 02/22/20 2304 02/23/20 0520 02/24/20 0515  WBC 4.5 4.2 5.1  NEUTROABS 2.9  --   --   HGB 8.1* 8.4* 8.9*  HCT 26.1* 27.0* 29.1*  MCV 87.9 89.1 89.3  PLT 101* 98* 112*

## 2020-02-24 NOTE — Progress Notes (Signed)
Spoke to Dr. Wynetta Emery and Dr. Laural Golden (assisting with GI coverage for RGA this week). Patient had EGD last week at [facility] for dysphagia which was normal. Speech evaluation was performed as well with significant oropharyngeal component noted. Will hold off on GI evaluation until after Speech has seen patient as symptoms are likely due to oropharyngeal dysphagia.   Laureen Ochs. Bernarda Caffey [facility] [phone] 7/22/20219:28 AM

## 2020-02-25 LAB — RENAL FUNCTION PANEL
Albumin: 2.9 g/dL — ABNORMAL LOW (ref 3.5–5.0)
Anion gap: 11 (ref 5–15)
BUN: 33 mg/dL — ABNORMAL HIGH (ref 8–23)
CO2: 27 mmol/L (ref 22–32)
Calcium: 8 mg/dL — ABNORMAL LOW (ref 8.9–10.3)
Chloride: 97 mmol/L — ABNORMAL LOW (ref 98–111)
Creatinine, Ser: 6.56 mg/dL — ABNORMAL HIGH (ref 0.61–1.24)
GFR calc Af Amer: 9 mL/min — ABNORMAL LOW (ref >60)
GFR calc non Af Amer: 8 mL/min — ABNORMAL LOW (ref >60)
Glucose, Bld: 101 mg/dL — ABNORMAL HIGH (ref 70–99)
Phosphorus: 3.7 mg/dL (ref 2.5–4.6)
Potassium: 3.8 mmol/L (ref 3.5–5.1)
Sodium: 135 mmol/L (ref 135–145)

## 2020-02-25 LAB — CBC
HCT: 27.5 % — ABNORMAL LOW (ref 39.0–52.0)
Hemoglobin: 8.5 g/dL — ABNORMAL LOW (ref 13.0–17.0)
MCH: 27.3 pg (ref 26.0–34.0)
MCHC: 30.9 g/dL (ref 30.0–36.0)
MCV: 88.4 fL (ref 80.0–100.0)
Platelets: 102 10*3/uL — ABNORMAL LOW (ref 150–400)
RBC: 3.11 MIL/uL — ABNORMAL LOW (ref 4.22–5.81)
RDW: 15.1 % (ref 11.5–15.5)
WBC: 4.6 10*3/uL (ref 4.0–10.5)
nRBC: 0 % (ref 0.0–0.2)

## 2020-02-25 LAB — GLUCOSE, CAPILLARY
Glucose-Capillary: 107 mg/dL — ABNORMAL HIGH (ref 70–99)
Glucose-Capillary: 97 mg/dL (ref 70–99)
Glucose-Capillary: 91 mg/dL (ref 70–99)
Glucose-Capillary: 80 mg/dL (ref 70–99)
Glucose-Capillary: 78 mg/dL (ref 70–99)

## 2020-02-25 LAB — MAGNESIUM: Magnesium: 2.1 mg/dL (ref 1.7–2.4)

## 2020-02-25 MED ORDER — HEPARIN SODIUM (PORCINE) 1000 UNIT/ML DIALYSIS: 20.0000 [IU]/kg | INTRAMUSCULAR | Status: DC | PRN

## 2020-02-25 MED ORDER — HEPARIN SODIUM (PORCINE) 1000 UNIT/ML DIALYSIS: 3800.0000 [IU] | INTRAMUSCULAR | Status: DC | PRN

## 2020-02-25 NOTE — Progress Notes (Signed)
Brian Liu, Brian Liu, Brian Liu, Brian Liu, Brian Liu, Brian Liu MWF, hypertension, multiple myeloma, pulmonary hypertension who came to hospital Brian chief complaint of difficulty swallowing.  Patient was seen at [facility] on 02/16/2021 for expressive aphasia and left-sided weakness.  At that time he was evaluated by neurology but found not to be a TPA candidate because he just had vascular procedure getting dialysis fistula earlier that day.  Patient symptoms improved.  He ended up being admitted to [facility] facility in [place] for stroke work-up.  Patient was found to have difficulty swallowing, he had EGD which was unremarkable.  I reviewed EGD report from [facility] and showed no significant abnormality.  His stroke work-up also was negative.  He was discharged Brian diet recommendation for thickened foods.  As per patient what ever he tries to swallow comes right back up. He denies chest pain or shortness of breath.  Denies nausea, vomiting or diarrhea.  Assessment & Plan:   Active Problems:   Hypertension   Pulmonary hypertension (HCC)   Dyslipidemia   Brian Liu   Brian (end stage renal disease) ([place])   Atrial fibrillation (HCC)   Multiple myeloma (HCC)   Brian Liu ([place])   Dysphagia   COVID-19 virus infection   MGUS (monoclonal gammopathy of unknown significance)  1. 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 SLP eval appreciated.  They are recommending outpatient speech therapy to continue working Brian him.  The only other alternative would be PEG placement which he is considering for the future.   2. Covid 19 - asymptomatic infection, good oxygen  saturation on room air.   3. Brian on HD - He tolerated HD 7/21 and planning for HD today per nephrology team.  Because he has Covid infection we are making arrangements for him to go to a Covid dialysis unit. Still waiting on the arrangements and transportation to be made before I can safely discharge.   4. Hypotension - chronic - he is on midodrine.  He has been weaned of levophed and BP has been holding.  5. Permanent atrial fibrillation - rate is well controlled, he is not anticoagulated due to history of severe GI bleeding.   DVT prophylaxis: sQ heparin  Code Status: full  Family Communication: son updated telephone Marcello Moores) Disposition:   Status is: Inpatient  Remains inpatient appropriate because:IV treatments appropriate due to intensity of illness or inability to take PO and Inpatient level of care appropriate due to severity of illness.  Awaiting arrangements for outpatient Covid Liu unit and transportation.   Dispo: The patient is from: Home              Anticipated d/c is to: Home              Anticipated d/c date is: 1 day              Patient currently is medically stable to d/c.  Consultants:   GI, SLP   Procedures:   MBS  Antimicrobials:     Subjective: Pt eating and drinking a little better today.   Objective: Vitals:   02/25/20 1550 02/25/20 1600 02/25/20 1630 02/25/20 1645  BP: (!) 123/63  113/69 (!) 100/53 (!) 114/50  Pulse: 69 72 74 70  Resp:      Temp:      TempSrc:      SpO2:      Weight:      Height:        Intake/Output Summary (Last 24 hours) at 02/25/2020 1700 Last data filed at 02/25/2020 0942 Gross per 24 hour  Intake 3 ml  Output --  Net 3 ml   Filed Weights   02/23/20 1700 02/24/20 2111 02/25/20 1540  Weight: 63.6 kg 61.3 kg 61.5 kg    Examination:  General exam: Appears calm and comfortable  Respiratory system: Clear to auscultation. Respiratory effort normal. Cardiovascular system: S1 & S2 heard, RRR. No JVD, murmurs,  rubs, gallops or clicks. No pedal edema. Gastrointestinal system: Abdomen is nondistended, soft and nontender. No organomegaly or masses felt. Normal bowel sounds heard. Central nervous system: Alert and oriented. No focal neurological deficits. Extremities: Symmetric 5 x 5 power. Skin: No rashes, lesions or ulcers Psychiatry: Judgement and insight appear normal. Mood & affect appropriate.   Data Reviewed: I have personally reviewed following labs and imaging studies  CBC: Recent Labs  Lab 02/22/20 2304 02/23/20 0520 02/24/20 0515 02/25/20 0639  WBC 4.5 4.2 5.1 4.6  NEUTROABS 2.9  --   --   --   HGB 8.1* 8.4* 8.9* 8.5*  HCT Liu.1* 27.0* 29.1* 27.5*  MCV 87.9 89.1 89.3 88.4  PLT 101* 98* 112* 102*    Basic Metabolic Panel: Recent Labs  Lab 02/22/20 2304 02/23/20 0520 02/24/20 0515 02/25/20 0639  NA 137 140 136 135  K 3.9 4.2 3.9 3.8  CL 99 99 96* 97*  CO2 '23 24 25 27  ' GLUCOSE 71 62* 98 101*  BUN 51* 54* 21 33*  CREATININE 9.30* 9.62* 5.17* 6.56*  CALCIUM 7.7* 8.0* 8.3* 8.0*  MG  --   --  2.1 2.1  PHOS  --   --  3.3 3.7    GFR: Estimated Creatinine Clearance: 9.4 mL/min (A) (by C-G formula based on SCr of 6.56 mg/dL (H)).  Liver Function Tests: Recent Labs  Lab 02/22/20 2304 02/23/20 0520 02/24/20 0515 02/25/20 0639  AST 11* 11*  --   --   ALT <5 <5  --   --   ALKPHOS 50 50  --   --   BILITOT 0.8 0.8  --   --   PROT 6.3* 6.4*  --   --   ALBUMIN 3.0* 3.0* 2.9* 2.9*    CBG: Recent Labs  Lab 02/24/20 2259 02/25/20 0252 02/25/20 0716 02/25/20 0811 02/25/20 1217  GLUCAP 98 107* 97 91 80    Recent Results (from the past 240 hour(s))  SARS Coronavirus 2 by RT PCR (hospital order, performed in [facility] hospital lab) Nasopharyngeal Nasopharyngeal Swab     Status: Abnormal   Collection Time: 02/22/20  9:05 PM   Specimen: Nasopharyngeal Swab  Result Value Ref Range Status   SARS Coronavirus 2 POSITIVE (A) NEGATIVE Final    Comment: RESULT CALLED TO,  READ BACK BY AND VERIFIED Brian: K NICHOLS,RN'@0002'  02/23/20 MKELLY (NOTE) SARS-CoV-2 target nucleic acids are DETECTED  SARS-CoV-2 RNA is generally detectable in upper respiratory specimens  during the acute phase of infection.  Positive results are indicative  of the presence of the identified virus, but do not rule out bacterial infection or co-infection Brian other pathogens not detected by the test.  Clinical correlation Brian patient history and  other diagnostic information is necessary to determine patient infection status.  The expected result is negative.  Fact Sheet for Patients:   StrictlyIdeas.no   Fact Sheet for Healthcare Providers:   BankingDealers.co.za    This test is not yet approved or cleared by the [place] FDA and  has been authorized for detection and/or diagnosis of SARS-CoV-2 by FDA under an Emergency Use Authorization (EUA).  This EUA will remain in effect (meaning this tes t can be used) for the duration of  the COVID-19 declaration under Section 564(b)(1) of the Act, 21 U.S.C. section 360-bbb-3(b)(1), unless the authorization is terminated or revoked sooner.  Performed at [facility], [address] [place], [place] [address]   MRSA PCR Screening     Status: None   Collection Time: 02/23/20  4:12 AM   Specimen: Nasal Mucosa; Nasopharyngeal  Result Value Ref Range Status   MRSA by PCR NEGATIVE NEGATIVE Final    Comment:        The GeneXpert MRSA Assay (FDA approved for NASAL specimens only), is one component of a comprehensive MRSA colonization surveillance program. It is not intended to diagnose MRSA infection nor to guide or monitor treatment for MRSA infections. Performed at [facility], [address] [place], [place] [address]      Radiology Studies: No results found.   Scheduled Meds: . Chlorhexidine Gluconate Cloth  6 each Topical Daily  . Chlorhexidine Gluconate Cloth  6 each  Topical Q0600  . cinacalcet  30 mg Oral QPC supper  . dorzolamide-timolol  1 drop Both Eyes BID  . heparin  5,000 Units Subcutaneous Q8H  . insulin aspart  0-6 Units Subcutaneous TID WC  . latanoprost  1 drop Both Eyes QHS  . [START ON 02/26/2020] levothyroxine  68.5 mcg Intravenous Daily  . midodrine  15 mg Oral BID  . mirtazapine  7.5 mg Oral QHS  . simvastatin  20 mg Oral q1800  . sodium chloride flush  3 mL Intravenous Q12H   Continuous Infusions: . sodium chloride    . sodium chloride    . sodium chloride    . sodium chloride       LOS: 2 days   Irwin Brakeman, Brian How to contact the [facility] Attending or Consulting provider [place] or covering provider during after hours [place], for this patient?  1. Check the care team in [facility] and look for a) attending/consulting TRH provider listed and b) the [facility] team listed 2. Log into [email or web address] and use [place]'s universal password to access. If you do not have the password, please contact the hospital operator. 3. Locate the [facility] provider you are looking for under [facility] and page to a number that you can be directly reached. 4. If you still have difficulty reaching the provider, please page the [facility] (Director on Call) for the Hospitalists listed on amion for assistance.  02/25/2020, 5:00 PM

## 2020-02-25 NOTE — Progress Notes (Signed)
Admit: 02/22/2020 LOS: 2  50M ESRD [facility] MWF with dysphagia, ASx COVID infection  Subjective:   No complaints, out of ICU  Working with speech pathology  07/22 0701 - 07/23 0700 In: 73 [I.V.:73] Out: -   Filed Weights   02/23/20 0426 02/23/20 1700 02/24/20 2111  Weight: 61.6 kg 63.6 kg 61.3 kg    Scheduled Meds:  Chlorhexidine Gluconate Cloth  6 each Topical Daily   Chlorhexidine Gluconate Cloth  6 each Topical Q0600   cinacalcet  30 mg Oral QPC supper   dorzolamide-timolol  1 drop Both Eyes BID   heparin  5,000 Units Subcutaneous Q8H   insulin aspart  0-6 Units Subcutaneous TID WC   latanoprost  1 drop Both Eyes QHS   [START ON 02/26/2020] levothyroxine  68.5 mcg Intravenous Daily   midodrine  15 mg Oral BID   mirtazapine  7.5 mg Oral QHS   simvastatin  20 mg Oral q1800   sodium chloride flush  3 mL Intravenous Q12H   Continuous Infusions:  sodium chloride     sodium chloride     sodium chloride     sodium chloride     PRN Meds:.sodium chloride, sodium chloride, sodium chloride, alteplase, heparin, ondansetron **OR** ondansetron (ZOFRAN) IV, pentafluoroprop-tetrafluoroeth, sodium chloride flush  Current Labs: reviewed    Physical Exam:  Blood pressure 114/71, pulse 72, temperature (!) 97.5 F (36.4 C), temperature source Oral, resp. rate 16, height 6\' 2"  (1.88 m), weight 61.3 kg, SpO2 100 %. Chronically ill-appearing, conversant NCAT Anicteric Clear bilaterally No rub, regular Right IJ TDC without erythema, purulence, tenderness Recent right upper arm AV access with bruit and thrill No significant edema  outpt prescription: 3.25hrs, 2K, 2.5Ca, Na 138 (will use 137), Bicarbonate 37 (will use 35), 35.5C, Dialyzer: F180 in place of gambro revaclear 300, holding home heparin 3000 unit bolus (can add back if needed)   A 1. ESRD MWF [facility], on schedule:  2. Dysphagia per SLP 3. Vol/BP: hypotension, lower EDW as able falling 2/2  #2 4. Anemia: Hb 8s, not on ESA, ? 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 Maturing RUE AVF 02/07/20 VVS  P  HD today on schedule: 2K, 3.5h, TDC 400/600, tight heparin, 1-2L UF SBP permitting  Medication Issues; o Preferred narcotic agents for pain control are hydromorphone, fentanyl, and methadone. Morphine should not be used.  o Baclofen should be avoided o Avoid oral sodium phosphate and magnesium citrate based laxatives / bowel preps    Pearson Grippe MD 02/25/2020, 10:21 AM  Recent Labs  Lab 02/23/20 0520 02/24/20 0515 02/25/20 0639  NA 140 136 135  K 4.2 3.9 3.8  CL 99 96* 97*  CO2 24 25 27   GLUCOSE 62* 98 101*  BUN 54* 21 33*  CREATININE 9.62* 5.17* 6.56*  CALCIUM 8.0* 8.3* 8.0*  PHOS  --  3.3 3.7   Recent Labs  Lab 02/22/20 2304 02/22/20 2304 02/23/20 0520 02/24/20 0515 02/25/20 0639  WBC 4.5   < > 4.2 5.1 4.6  NEUTROABS 2.9  --   --   --   --   HGB 8.1*   < > 8.4* 8.9* 8.5*  HCT 26.1*   < > 27.0* 29.1* 27.5*  MCV 87.9   < > 89.1 89.3 88.4  PLT 101*   < > 98* 112* 102*   < > = values in this interval not displayed.

## 2020-02-25 NOTE — Procedures (Signed)
° °  ° °  HEMODIALYSIS TREATMENT NOTE:   3.5 hour low-heparin HD completed via right chest wall TDC. Goal of 1-2L  NOT met.  SBP was kept >100 as ordered and UF was intermittently interrupted for a total of 1 hour and 15 minutes for asymptomatic hypotension.  Net UF 708cc.  All blood was returned.  Rockwell Alexandria, RN

## 2020-02-25 NOTE — Progress Notes (Signed)
  Speech Language Pathology Treatment: Dysphagia  Patient Details Name: Brian Liu MRN: [id number] DOB: 1951/05/10 Today's Date: 02/25/2020 Time: 0812-0827 SLP Time Calculation (min) (ACUTE ONLY): 15 min  Assessment / Plan / Recommendation Clinical Impression  SLP provided ongoing diagnostic dysphagia therapy targeting trials of D1/puree. Pt consumed D1 textures with s/sx of pharyngeal and suspected esophageal dysphagia with similar presentation from treatment yesterday including multiple swallows and delayed throat clearing. Pt did report that it "went on down" today and improved ease of swallowing, however presentation continues to be consistent with findings from MBSS (see previous ST note where MBSS is reported in detail). Recommend continue with current diet D1/HTL and meds to be crushed in puree. Pt requires feeder assist secondary to vision impairment. Echo recommendation for Brian Liu will continue to follow acutely.   HPI HPI: Brian Liu  is a 69 y.o. male, with history of permanent atrial fibrillation not on Coumadin due to GI bleed, diastolic dysfunction, ESRD on hemodialysis MWF, hypertension, multiple myeloma, pulmonary hypertension who came to hospital with chief complaint of difficulty swallowing.  Patient was seen at [facility] on 02/16/2021 for expressive aphasia and left-sided weakness.  At that time he was evaluated by neurology but found not to be a TPA candidate because he just had vascular procedure getting dialysis fistula earlier that day.  Patient symptoms improved.  He ended up being admitted to [facility] facility in [place] for stroke work-up.  Patient was found to have difficulty swallowing, he had EGD which was unremarkable.  I reviewed EGD report from [facility] and showed no significant abnormality.  His stroke work-up also was negative. BSE requested.      SLP Plan  Continue with current plan of care       Recommendations  Diet recommendations: Dysphagia 1  (puree);Honey-thick liquid Liquids provided via: Cup;No straw Medication Administration: Crushed with puree Supervision: Patient able to self feed;Intermittent supervision to cue for compensatory strategies Compensations: Slow rate;Small sips/bites;Multiple dry swallows after each bite/sip Postural Changes and/or Swallow Maneuvers: Seated upright 90 degrees;Upright 30-60 min after meal                Oral Care Recommendations: Oral care prior to ice chip/H20;Staff/trained caregiver to provide oral care Follow up Recommendations: Outpatient SLP;Home health SLP SLP Visit Diagnosis: Dysphagia, unspecified (R13.10) Plan: Continue with current plan of care       Leighanna Kirn H. Roddie Mc, CCC-SLP Speech Language Pathologist   Wende Bushy 02/25/2020, 8:30 AM

## 2020-02-25 NOTE — Progress Notes (Signed)
Pt very upset tonight. Pt is angry because he wants to go home or have visitors. Explained to patient that it is already after 8pm and visitation hours are over. Pt started yelling and became verbally abusive towards me after I asked him to sit back in the bed because he was trying to get up. He told me that he was not going to "get back in the f*cking bed" he said that if I tried to make him he would punch me and kick me. I asked patient if he was aware of where he was, he said he was "at the damn hospital". I called pt's son while in the room and he seemed to calm the patient. He assured him that he would be by in the morning to see him. Pt asked him to bring the police so they will let him leave. Son reassured him that there was no need to bring the police. Pt still upset after I left the room but no longer yelling.

## 2020-02-25 NOTE — Care Management Important Message (Signed)
Important Message  Patient Details  Name: Brian Liu MRN: [id number] Date of Birth: [birth year]   Medicare Important Message Given:  Yes Vida Roller, RN agreed to deliver letter due to contact precautions)     Tommy Medal 02/25/2020, 2:34 PM

## 2020-02-26 ENCOUNTER — Encounter (HOSPITAL_COMMUNITY): Payer: Self-pay | Admitting: Family Medicine

## 2020-02-26 LAB — MAGNESIUM: Magnesium: 1.9 mg/dL (ref 1.7–2.4)

## 2020-02-26 LAB — RENAL FUNCTION PANEL
Albumin: 2.8 g/dL — ABNORMAL LOW (ref 3.5–5.0)
Anion gap: 15 (ref 5–15)
BUN: 20 mg/dL (ref 8–23)
CO2: 26 mmol/L (ref 22–32)
Calcium: 8.4 mg/dL — ABNORMAL LOW (ref 8.9–10.3)
Chloride: 95 mmol/L — ABNORMAL LOW (ref 98–111)
Creatinine, Ser: 3.83 mg/dL — ABNORMAL HIGH (ref 0.61–1.24)
GFR calc Af Amer: 18 mL/min — ABNORMAL LOW (ref >60)
GFR calc non Af Amer: 15 mL/min — ABNORMAL LOW (ref >60)
Glucose, Bld: 77 mg/dL (ref 70–99)
Phosphorus: 2.5 mg/dL (ref 2.5–4.6)
Potassium: 3.4 mmol/L — ABNORMAL LOW (ref 3.5–5.1)
Sodium: 136 mmol/L (ref 135–145)

## 2020-02-26 LAB — GLUCOSE, CAPILLARY
Glucose-Capillary: 90 mg/dL (ref 70–99)
Glucose-Capillary: 76 mg/dL (ref 70–99)
Glucose-Capillary: 77 mg/dL (ref 70–99)
Glucose-Capillary: 67 mg/dL — ABNORMAL LOW (ref 70–99)

## 2020-02-26 LAB — CBC
HCT: 25.5 % — ABNORMAL LOW (ref 39.0–52.0)
Hemoglobin: 8 g/dL — ABNORMAL LOW (ref 13.0–17.0)
MCH: 27.6 pg (ref 26.0–34.0)
MCHC: 31.4 g/dL (ref 30.0–36.0)
MCV: 87.9 fL (ref 80.0–100.0)
Platelets: 104 10*3/uL — ABNORMAL LOW (ref 150–400)
RBC: 2.9 MIL/uL — ABNORMAL LOW (ref 4.22–5.81)
RDW: 14.8 % (ref 11.5–15.5)
WBC: 4.5 10*3/uL (ref 4.0–10.5)
nRBC: 0 % (ref 0.0–0.2)

## 2020-02-26 MED ORDER — LEVOTHYROXINE SODIUM 137 MCG PO TABS
137.0000 ug | ORAL_TABLET | Freq: Every day | ORAL | Status: DC
  Administered 2020-02-26: 137 ug via ORAL
  Filled 2020-02-26: qty 1

## 2020-02-26 NOTE — Progress Notes (Signed)
Nsg Discharge Note  Admit Date:  02/22/2020 Discharge date: 02/26/2020   Brian Liu to be D/C'd Home per MD order.  AVS completed.  Patient's caregiver Brian Liu (son) able to verbalize understanding.  Discharge Medication: Allergies as of 02/26/2020      Reactions   Hydrocodone Itching   Cough syrup      Medication List    TAKE these medications   Auryxia 1 GM 210 MG(Fe) tablet Generic drug: ferric citrate Take 210 mg by mouth 3 (three) times daily with meals.   bimatoprost 0.03 % ophthalmic solution Commonly known as: LUMIGAN Place 1 drop into both eyes at bedtime.   cinacalcet 30 MG tablet Commonly known as: SENSIPAR Take 30 mg by mouth at bedtime.   dorzolamide-timolol 22.3-6.8 MG/ML ophthalmic solution Commonly known as: COSOPT Place 1 drop into both eyes 2 (two) times daily.   folic acid 1 MG tablet Commonly known as: FOLVITE Take 1 mg by mouth daily.   folic acid-vitamin b complex-vitamin c-selenium-zinc 3 MG Tabs tablet Take 1 tablet by mouth daily.   gabapentin 100 MG capsule Commonly known as: NEURONTIN Take 100 mg by mouth 2 (two) times daily.   lanthanum 1000 MG chewable tablet Commonly known as: FOSRENOL Chew 500-1,000 mg by mouth See admin instructions. Chew 1000mg  twice daily with meals and 500mg  with snacks   levothyroxine 137 MCG tablet Commonly known as: SYNTHROID Take 137 mcg by mouth daily before breakfast.   megestrol 20 MG tablet Commonly known as: MEGACE Take 20 mg by mouth daily.   midodrine 10 MG tablet Commonly known as: PROAMATINE Take 15 mg by mouth in the morning and at bedtime.   mirtazapine 7.5 MG tablet Commonly known as: REMERON Take 7.5 mg by mouth at bedtime.   oxyCODONE-acetaminophen 5-325 MG tablet Commonly known as: Percocet Take 1 tablet by mouth every 4 (four) hours as needed for severe pain.   simvastatin 20 MG tablet Commonly known as: ZOCOR Take 20 mg by mouth daily.   Vyzulta 0.024 %  Soln Generic drug: Latanoprostene Bunod Place 1 drop into both eyes at bedtime.       Discharge Assessment: Vitals:   02/26/20 0458 02/26/20 1700  BP: 115/84 109/79  Pulse: 81 74  Resp: 17   Temp: 97.7 F (36.5 C) 97.6 F (36.4 C)  SpO2: 100% 100%   Skin clean, dry and intact without evidence of skin break down, no evidence of skin tears noted. IV catheter discontinued intact. Site without signs and symptoms of complications - no redness or edema noted at insertion site, patient denies c/o pain - only slight tenderness at site.  Dressing with slight pressure applied.  D/c Instructions-Education: Discharge instructions given to patient's son Brian Liu with verbalized understanding. D/c education completed with patient's son including follow up instructions, medication list, d/c activities limitations if indicated, with other d/c instructions as indicated by MD - patient able to verbalize understanding, all questions fully answered. Patient instructed to return to ED, call 911, or call MD for any changes in condition.  Patient escorted via [place], and D/C home via private auto.  Berton Bon, RN 02/26/2020 5:59 PM

## 2020-02-26 NOTE — TOC Transition Note (Addendum)
Transition of Care [facility]) - CM/SW Discharge Note   Patient Details  Name: Brian Liu MRN: [id number] Date of Birth: 28-Mar-1951  [facility]) CM/SW Contact:  Natasha Bence, LCSW Phone Number: 02/26/2020, 12:04 PM   Clinical Narrative:    CSW spoke with [facility] rep who reported that the supervisor with the center was able to arrange for the patient to receive dialysis treatment at the center with his asymptomatic Covid diagnosis. Patient has been scheduled to receive treatment on Monday 02/26/2020 at 4:45pm. Rep reported that they had notified the family of appointment. CSW also placed referral for [facility] speech. [facility] agreeable to take patient.TOC signing off.      Barriers to Discharge: Other (comment) (Patient has COVID and cannot return back to [facility] for 14-21 days.)   Patient Goals and CMS Choice Patient states their goals for this hospitalization and ongoing recovery are:: Return home.      Discharge Placement                       Discharge Plan and Services                                     Social Determinants of Health (SDOH) Interventions     Readmission Risk Interventions No flowsheet data found.

## 2020-02-26 NOTE — Discharge Summary (Signed)
Physician Discharge Summary  Brian Liu ION:[id number] DOB: [birth year] DOA: 02/22/2020  PCP: [name], MD  Admit date: 02/22/2020 Discharge date: 02/26/2020  Admitted From:  Home  Disposition:  Home with [facility]  Recommendations for Outpatient Follow-up:  1. Covid dialysis center on Monday at 4:45 pm as arranged 2. Follow up with PCP in 1 weeks 3. Dysphagia 1 diet with honey thickened liquids  Home Health:  SLP  Discharge Condition: STABLE   CODE STATUS: FULL   Brief Hospitalization Summary: Please see all hospital notes, images, labs for full details of the hospitalization. ADMISSION HPI:  Brian Liu  is a 69 y.o. male, with history of permanent atrial fibrillation not on Coumadin due to GI bleed, diastolic dysfunction, ESRD on hemodialysis MWF, hypertension, multiple myeloma, pulmonary hypertension who came to hospital with chief complaint of difficulty swallowing.  Patient was seen at [facility] on 02/16/2021 for expressive aphasia and left-sided weakness.  At that time he was evaluated by neurology but found not to be a TPA candidate because he just had vascular procedure getting dialysis fistula earlier that day.  Patient symptoms improved.  He ended up being admitted to [facility] facility in [place] for stroke work-up.  Patient was found to have difficulty swallowing, he had EGD which was unremarkable.  I reviewed EGD report from [facility] and showed no significant abnormality.  His stroke work-up also was negative. He was discharged with diet recommendation for thickened foods.  As per patient what ever he tries to swallow comes right back up. He denies chest pain or shortness of breath. Denies nausea, vomiting or diarrhea.  Assessment & Plan:   Active Problems:   Hypertension   Pulmonary hypertension    Dyslipidemia   Diastolic dysfunction   ESRD (end stage renal disease)   Atrial fibrillation    Multiple myeloma    ESRD on hemodialysis   Dysphagia   COVID-19 virus  infection   MGUS (monoclonal gammopathy of unknown significance)  1. 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 SLP eval appreciated.  They are recommending outpatient speech therapy to continue working with him.  The only other alternative would be PEG placement which he is considering for the future.  He is tolerating a dysphagia 1 diet with honey thick liquids which he should continue. 2. Covid 19 - asymptomatic infection, good oxygen saturation on room air.     3. ESRD on HD - He tolerated HD 7/21 and planning for HD today per nephrology team.  Because he has Covid infection TOC team made arrangements for him to go to a Covid dialysis unit.  He can go on Monday at 4:45 PM.   4. Hypotension - chronic - he is on midodrine.   5. Permanent atrial fibrillation - rate is well controlled, he is not anticoagulated due to history of severe GI bleeding.   DVT prophylaxis: sQ heparin  Code Status: full  Family Communication: son updated telephone Brian Liu) Disposition: Home with [facility]  Discharge Diagnoses:  Active Problems:   Hypertension   Pulmonary hypertension (HCC)   Dyslipidemia   Diastolic dysfunction   ESRD (end stage renal disease) ([place])   Atrial fibrillation ([place])   Multiple myeloma ([place])   ESRD on hemodialysis ([place])   Dysphagia   COVID-19 virus infection   MGUS (monoclonal gammopathy of unknown significance)   Discharge Instructions: Discharge Instructions    MyChart COVID-19 home monitoring program   Complete by: Feb 26, 2020  PT IS BLIND   Is the patient willing to use the [facility] for home monitoring?: No     Allergies as of 02/26/2020      Reactions   Hydrocodone Itching   Cough syrup      Medication List    TAKE these medications   Auryxia 1 GM 210 MG(Fe) tablet Generic drug: ferric citrate Take 210 mg by mouth 3 (three) times daily with meals.   bimatoprost 0.03 % ophthalmic solution Commonly known  as: LUMIGAN Place 1 drop into both eyes at bedtime.   cinacalcet 30 MG tablet Commonly known as: SENSIPAR Take 30 mg by mouth at bedtime.   dorzolamide-timolol 22.3-6.8 MG/ML ophthalmic solution Commonly known as: COSOPT Place 1 drop into both eyes 2 (two) times daily.   folic acid 1 MG tablet Commonly known as: FOLVITE Take 1 mg by mouth daily.   folic acid-vitamin b complex-vitamin c-selenium-zinc 3 MG Tabs tablet Take 1 tablet by mouth daily.   gabapentin 100 MG capsule Commonly known as: NEURONTIN Take 100 mg by mouth 2 (two) times daily.   lanthanum 1000 MG chewable tablet Commonly known as: FOSRENOL Chew 500-1,000 mg by mouth See admin instructions. Chew '1000mg'$  twice daily with meals and '500mg'$  with snacks   levothyroxine 137 MCG tablet Commonly known as: SYNTHROID Take 137 mcg by mouth daily before breakfast.   megestrol 20 MG tablet Commonly known as: MEGACE Take 20 mg by mouth daily.   midodrine 10 MG tablet Commonly known as: PROAMATINE Take 15 mg by mouth in the morning and at bedtime.   mirtazapine 7.5 MG tablet Commonly known as: REMERON Take 7.5 mg by mouth at bedtime.   oxyCODONE-acetaminophen 5-325 MG tablet Commonly known as: Percocet Take 1 tablet by mouth every 4 (four) hours as needed for severe pain.   simvastatin 20 MG tablet Commonly known as: ZOCOR Take 20 mg by mouth daily.   Vyzulta 0.024 % Soln Generic drug: Latanoprostene Bunod Place 1 drop into both eyes at bedtime.       Follow-up Information    Vyas, Dhruv B, MD. Schedule an appointment as soon as possible for a visit in 1 week(s).   Specialty: Internal Medicine Contact information: [place] [address] [phone]        Herminio Commons, MD .   Specialty: Cardiology Contact information: [place] [address] [phone]              Allergies  Allergen Reactions  . Hydrocodone Itching    Cough syrup   Allergies as of  02/26/2020      Reactions   Hydrocodone Itching   Cough syrup      Medication List    TAKE these medications   Auryxia 1 GM 210 MG(Fe) tablet Generic drug: ferric citrate Take 210 mg by mouth 3 (three) times daily with meals.   bimatoprost 0.03 % ophthalmic solution Commonly known as: LUMIGAN Place 1 drop into both eyes at bedtime.   cinacalcet 30 MG tablet Commonly known as: SENSIPAR Take 30 mg by mouth at bedtime.   dorzolamide-timolol 22.3-6.8 MG/ML ophthalmic solution Commonly known as: COSOPT Place 1 drop into both eyes 2 (two) times daily.   folic acid 1 MG tablet Commonly known as: FOLVITE Take 1 mg by mouth daily.   folic acid-vitamin b complex-vitamin c-selenium-zinc 3 MG Tabs tablet Take 1 tablet by mouth daily.   gabapentin 100 MG capsule Commonly known  as: NEURONTIN Take 100 mg by mouth 2 (two) times daily.   lanthanum 1000 MG chewable tablet Commonly known as: FOSRENOL Chew 500-1,000 mg by mouth See admin instructions. Chew '1000mg'$  twice daily with meals and '500mg'$  with snacks   levothyroxine 137 MCG tablet Commonly known as: SYNTHROID Take 137 mcg by mouth daily before breakfast.   megestrol 20 MG tablet Commonly known as: MEGACE Take 20 mg by mouth daily.   midodrine 10 MG tablet Commonly known as: PROAMATINE Take 15 mg by mouth in the morning and at bedtime.   mirtazapine 7.5 MG tablet Commonly known as: REMERON Take 7.5 mg by mouth at bedtime.   oxyCODONE-acetaminophen 5-325 MG tablet Commonly known as: Percocet Take 1 tablet by mouth every 4 (four) hours as needed for severe pain.   simvastatin 20 MG tablet Commonly known as: ZOCOR Take 20 mg by mouth daily.   Vyzulta 0.024 % Soln Generic drug: Latanoprostene Bunod Place 1 drop into both eyes at bedtime.      Procedures/Studies: DG Chest Port 1 View  Result Date: 02/23/2020 CLINICAL DATA:  Cough and inability to swallow.  COVID-19 positive. EXAM: PORTABLE CHEST 1 VIEW COMPARISON:   07/10/2018 FINDINGS: A right jugular dialysis catheter remains in place terminating over the high right atrium. The cardiac silhouette remains mildly enlarged. Aortic atherosclerosis is noted. The lungs are hyperinflated. Mildly accentuated interstitial markings bilaterally are similar to the prior study. No confluent airspace opacity, edema, sizable pleural effusion, or pneumothorax is identified. Calcified pleural plaques are noted. Oral contrast material is partially visualized in the colon. A right shoulder arthroplasty is noted. IMPRESSION: Chronic findings without evidence of acute cardiopulmonary disease. Electronically Signed   By: Logan Bores M.D.   On: 02/23/2020 08:11      Subjective: Pt denies shortness of breath, cough, chest pain and is demanding to go home.  Pt has been eating and tolerating puree diet.   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 Respiratory effort normal. Cardiovascular system: S1 & S2 heard, RRR. No JVD, murmurs, rubs, gallops or clicks. No pedal edema. Gastrointestinal system: Abdomen is nondistended, soft and nontender. No organomegaly or masses felt. Normal bowel sounds heard. Central nervous system: Alert and oriented. No focal neurological deficits. Extremities: Symmetric 5 x 5 power. Skin: No rashes, lesions or ulcers Psychiatry: Judgement and insight appear normal. Mood & affect appropriate.    The results of significant diagnostics from this hospitalization (including imaging,  microbiology, ancillary and laboratory) are listed below for reference.     Microbiology: Recent Results (from the past 240 hour(s))  SARS Coronavirus 2 by RT PCR (hospital order, performed in [facility] hospital lab) Nasopharyngeal Nasopharyngeal Swab     Status: Abnormal   Collection Time: 02/22/20  9:05 PM   Specimen: Nasopharyngeal Swab  Result Value Ref Range Status   SARS Coronavirus  2 POSITIVE (A) NEGATIVE Final    Comment: RESULT CALLED TO, READ BACK BY AND VERIFIED WITH: K NICHOLS,RN'@0002'$  02/23/20 MKELLY (NOTE) SARS-CoV-2 target nucleic acids are DETECTED  SARS-CoV-2 RNA is generally detectable in upper respiratory specimens  during the acute phase of infection.  Positive results are indicative  of the presence of the identified virus, but do not rule out bacterial infection or co-infection with other pathogens not detected by the test.  Clinical correlation with patient history and  other diagnostic information is necessary to determine patient infection status.  The expected result is negative.  Fact Sheet for Patients:   StrictlyIdeas.no   Fact Sheet for Healthcare Providers:   BankingDealers.co.za    This test is not yet approved or cleared by the [place] FDA and  has been authorized for detection and/or diagnosis of SARS-CoV-2 by FDA under an Emergency Use Authorization (EUA).  This EUA will remain in effect (meaning this tes t can be used) for the duration of  the COVID-19 declaration under Section 564(b)(1) of the Act, 21 U.S.C. section 360-bbb-3(b)(1), unless the authorization is terminated or revoked sooner.  Performed at [facility], [address], [place], [place] [address]   MRSA PCR Screening     Status: None   Collection Time: 02/23/20  4:12 AM   Specimen: Nasal Mucosa; Nasopharyngeal  Result Value Ref Range Status   MRSA by PCR NEGATIVE NEGATIVE Final    Comment:        The GeneXpert MRSA Assay  (FDA approved for NASAL specimens only), is one component of a comprehensive MRSA colonization surveillance program. It is not intended to diagnose MRSA infection nor to guide or monitor treatment for MRSA infections. Performed at [facility], [address] [place], [place] [address]      Labs: BNP (last 3 results) No results for input(s): BNP in the last 8760 hours. Basic Metabolic Panel: Recent Labs  Lab 02/22/20 2304 02/23/20 0520 02/24/20 0515 02/25/20 0639 02/26/20 0647  NA 137 140 136 135 136  K 3.9 4.2 3.9 3.8 3.4*  CL 99 99 96* 97* 95*  CO2 '23 24 25 27 26  '$ GLUCOSE 71 62* 98 101* 77  BUN 51* 54* 21 33* 20  CREATININE 9.30* 9.62* 5.17* 6.56* 3.83*  CALCIUM 7.7* 8.0* 8.3* 8.0* 8.4*  MG  --   --  2.1 2.1 1.9  PHOS  --   --  3.3 3.7 2.5   Liver Function Tests: Recent Labs  Lab 02/22/20 2304 02/23/20 0520 02/24/20 0515 02/25/20 0639 02/26/20 0647  AST 11* 11*  --   --   --   ALT <5 <5  --   --   --   ALKPHOS 50 50  --   --   --   BILITOT 0.8 0.8  --   --   --   PROT 6.3* 6.4*  --   --   --   ALBUMIN 3.0* 3.0* 2.9* 2.9* 2.8*   No results for input(s): LIPASE, AMYLASE in the last 168 hours. No results for input(s): AMMONIA in the last 168 hours. CBC: Recent Labs  Lab 02/22/20 2304 02/23/20 0520 02/24/20 0515 02/25/20 0639 02/26/20 0647  WBC 4.5 4.2 5.1 4.6 4.5  NEUTROABS 2.9  --   --   --   --   HGB 8.1* 8.4* 8.9* 8.5* 8.0*  HCT 26.1* 27.0* 29.1* 27.5* 25.5*  MCV 87.9 89.1 89.3 88.4 87.9  PLT 101* 98* 112* 102* 104*  Cardiac Enzymes: No results for input(s): CKTOTAL, CKMB, CKMBINDEX, TROPONINI in the last 168 hours. BNP: Invalid input(s): POCBNP CBG: Recent Labs  Lab 02/25/20 0811 02/25/20 1217 02/25/20 1730 02/26/20 0453 02/26/20 0827  GLUCAP 91 80 78 90 76   D-Dimer No results for input(s): DDIMER in the last 72 hours. Hgb A1c No results for input(s): HGBA1C in the last 72 hours. Lipid Profile No results for input(s): CHOL,  HDL, LDLCALC, TRIG, CHOLHDL, LDLDIRECT in the last 72 hours. Thyroid function studies No results for input(s): TSH, T4TOTAL, T3FREE, THYROIDAB in the last 72 hours.  Invalid input(s): FREET3 Anemia work up No results for input(s): VITAMINB12, FOLATE, FERRITIN, TIBC, IRON, RETICCTPCT in the last 72 hours. Urinalysis No results found for: COLORURINE, APPEARANCEUR, [place], [place], [place], [place], [place], [place], PROTEINUR, UROBILINOGEN, NITRITE, LEUKOCYTESUR Sepsis Labs Invalid input(s): PROCALCITONIN,  WBC,  LACTICIDVEN Microbiology Recent Results (from the past 240 hour(s))  SARS Coronavirus 2 by RT PCR (hospital order, performed in [facility] hospital lab) Nasopharyngeal Nasopharyngeal Swab     Status: Abnormal   Collection Time: 02/22/20  9:05 PM   Specimen: Nasopharyngeal Swab  Result Value Ref Range Status   SARS Coronavirus 2 POSITIVE (A) NEGATIVE Final    Comment: RESULT CALLED TO, READ BACK BY AND VERIFIED WITH: K NICHOLS,RN'@0002'$  02/23/20 MKELLY (NOTE) SARS-CoV-2 target nucleic acids are DETECTED  SARS-CoV-2 RNA is generally detectable in upper respiratory specimens  during the acute phase of infection.  Positive results are indicative  of the presence of the identified virus, but do not rule out bacterial infection or co-infection with other pathogens not detected by the test.  Clinical correlation with patient history and  other diagnostic information is necessary to determine patient infection status.  The expected result is negative.  Fact Sheet for Patients:   StrictlyIdeas.no   Fact Sheet for Healthcare Providers:   BankingDealers.co.za    This test is not yet approved or cleared by the [place] FDA and  has been authorized for detection and/or diagnosis of SARS-CoV-2 by FDA under an Emergency Use Authorization (EUA).  This EUA will remain in effect (meaning this tes t can be used) for the duration of   the COVID-19 declaration under Section 564(b)(1) of the Act, 21 U.S.C. section 360-bbb-3(b)(1), unless the authorization is terminated or revoked sooner.  Performed at [facility], [address], [place], [place] [address]   MRSA PCR Screening     Status: None   Collection Time: 02/23/20  4:12 AM   Specimen: Nasal Mucosa; Nasopharyngeal  Result Value Ref Range Status   MRSA by PCR NEGATIVE NEGATIVE Final    Comment:        The GeneXpert MRSA Assay (FDA approved for NASAL specimens only), is one component of a comprehensive MRSA colonization surveillance program. It is not intended to diagnose MRSA infection nor to guide or monitor treatment for MRSA infections. Performed at [facility], [address], [place], [place] [address]    Time coordinating discharge: 35 minutes   SIGNED:  Irwin Brakeman, MD  [facility] 02/26/2020, 12:40 PM How to contact the [facility] Attending or Consulting provider [place] or covering provider during after hours [place], for this patient?  1. Check the care team in [facility] and look for a) attending/consulting TRH provider listed and b) the [facility] team listed 2. Log into [email or web address] and use [facility]'s universal password to access. If you do not have the password, please contact the hospital operator. 3. Locate the  [place] provider you are looking for under [facility] and page to a number that you can be directly reached. 4. If you still have difficulty reaching the provider, please page the [facility] (Director on Call) for the Hospitalists listed on amion for assistance.

## 2020-02-26 NOTE — Discharge Instructions (Signed)
DIET: puree with honey thickened liquids Please go to [facility] dialysis center on Monday 4:45 pm  COVID-19 Frequently Asked Questions COVID-19 (coronavirus disease) is an infection that is caused by a large family of viruses. Some viruses cause illness in people and others cause illness in animals like camels, cats, and bats. In some cases, the viruses that cause illness in animals can spread to humans. Where did the coronavirus come from? In December 2019, [place] told the [facility]) of several cases of lung disease (human respiratory illness). These cases were linked to an open seafood and livestock market in the city of [place]. The link to the seafood and livestock market suggests that the virus may have spread from animals to humans. However, since that first outbreak in December, the virus has also been shown to spread from person to person. What is the name of the disease and the virus? Disease name Early on, this disease was called novel coronavirus. This is because scientists determined that the disease was caused by a new (novel) respiratory virus. The World Health Organization [facility]) has now named the disease COVID-19, or coronavirus disease. Virus name The virus that causes the disease is called severe acute respiratory syndrome coronavirus 2 (SARS-CoV-2). More information on disease and virus naming World Health Organization [facility]): www.who.int/emergencies/diseases/novel-coronavirus-2019/technical-guidance/naming-the-coronavirus-disease-(covid-2019)-and-the-virus-that-causes-it Who is at risk for complications from coronavirus disease? Some people may be at higher risk for complications from coronavirus disease. This includes older adults and people who have chronic diseases, such as heart disease, diabetes, and lung disease. If you are at higher risk for complications, take these extra precautions:  Stay home as much as possible.  Avoid social gatherings and  travel.  Avoid close contact with others. Stay at least 6 ft (2 m) away from others, if possible.  Wash your hands often with soap and water for at least 20 seconds.  Avoid touching your face, mouth, nose, or eyes.  Keep supplies on hand at home, such as food, medicine, and cleaning supplies.  If you must go out in public, wear a cloth face covering or face mask. Make sure your mask covers your nose and mouth. How does coronavirus disease spread? The virus that causes coronavirus disease spreads easily from person to person (is contagious). You may catch the virus by:  Breathing in droplets from an infected person. Droplets can be spread by a person breathing, speaking, singing, coughing, or sneezing.  Touching something, like a table or a doorknob, that was exposed to the virus (contaminated) and then touching your mouth, nose, or eyes. Can I get the virus from touching surfaces or objects? There is still a lot that we do not know about the virus that causes coronavirus disease. Scientists are basing a lot of information on what they know about similar viruses, such as:  Viruses cannot generally survive on surfaces for long. They need a human body (host) to survive.  It is more likely that the virus is spread by close contact with people who are sick (direct contact), such as through: ? Shaking hands or hugging. ? Breathing in respiratory droplets that travel through the air. Droplets can be spread by a person breathing, speaking, singing, coughing, or sneezing.  It is less likely that the virus is spread when a person touches a surface or object that has the virus on it (indirect contact). The virus may be able to enter the body if the person touches a surface or object and then touches his or her  face, eyes, nose, or mouth. Can a person spread the virus without having symptoms of the disease? It may be possible for the virus to spread before a person has symptoms of the disease, but  this is most likely not the main way the virus is spreading. It is more likely for the virus to spread by being in close contact with people who are sick and breathing in the respiratory droplets spread by a person breathing, speaking, singing, coughing, or sneezing. What are the symptoms of coronavirus disease? Symptoms vary from person to person and can range from mild to severe. Symptoms may include:  Fever or chills.  Cough.  Difficulty breathing or feeling short of breath.  Headaches, body aches, or muscle aches.  Runny or stuffy (congested) nose.  Sore throat.  New loss of taste or smell.  Nausea, vomiting, or diarrhea. These symptoms can appear anywhere from 2 to 14 days after you have been exposed to the virus. Some people may not have any symptoms. If you develop symptoms, call your health care provider. People with severe symptoms may need hospital care. Should I be tested for this virus? Your health care provider will decide whether to test you based on your symptoms, history of exposure, and your risk factors. How does a health care provider test for this virus? Health care providers will collect samples to send for testing. Samples may include:  Taking a swab of fluid from the back of your nose and throat, your nose, or your throat.  Taking fluid from the lungs by having you cough up mucus (sputum) into a sterile cup.  Taking a blood sample. Is there a treatment or vaccine for this virus? Currently, there is no vaccine to prevent coronavirus disease. Also, there are no medicines like antibiotics or antivirals to treat the virus. A person who becomes sick is given supportive care, which means rest and fluids. A person may also relieve his or her symptoms by using over-the-counter medicines that treat sneezing, coughing, and runny nose. These are the same medicines that a person takes for the common cold. If you develop symptoms, call your health care provider. People with  severe symptoms may need hospital care. What can I do to protect myself and my family from this virus?     You can protect yourself and your family by taking the same actions that you would take to prevent the spread of other viruses. Take the following actions:  Wash your hands often with soap and water for at least 20 seconds. If soap and water are not available, use alcohol-based hand sanitizer.  Avoid touching your face, mouth, nose, or eyes.  Cough or sneeze into a tissue, sleeve, or elbow. Do not cough or sneeze into your hand or the air. ? If you cough or sneeze into a tissue, throw it away immediately and wash your hands.  Disinfect objects and surfaces that you frequently touch every day.  Stay away from people who are sick.  Avoid going out in public, follow guidance from your state and local health authorities.  Avoid crowded indoor spaces. Stay at least 6 ft (2 m) away from others.  If you must go out in public, wear a cloth face covering or face mask. Make sure your mask covers your nose and mouth.  Stay home if you are sick, except to get medical care. Call your health care provider before you get medical care. Your health care provider will tell you how long to  stay home.  Make sure your vaccines are up to date. Ask your health care provider what vaccines you need. What should I do if I need to travel? Follow travel recommendations from your local health authority, the CDC, and WHO. Travel information and advice  Centers for Disease Control and Prevention (CDC): BodyEditor.hu  World Health Organization [facility]): ThirdIncome.ca Know the risks and take action to protect your health  You are at higher risk of getting coronavirus disease if you are traveling to areas with an outbreak or if you are exposed to travelers from areas with an outbreak.  Wash your hands often and  practice good hygiene to lower the risk of catching or spreading the virus. What should I do if I am sick? General instructions to stop the spread of infection  Wash your hands often with soap and water for at least 20 seconds. If soap and water are not available, use alcohol-based hand sanitizer.  Cough or sneeze into a tissue, sleeve, or elbow. Do not cough or sneeze into your hand or the air.  If you cough or sneeze into a tissue, throw it away immediately and wash your hands.  Stay home unless you must get medical care. Call your health care provider or local health authority before you get medical care.  Avoid public areas. Do not take public transportation, if possible.  If you can, wear a mask if you must go out of the house or if you are in close contact with someone who is not sick. Make sure your mask covers your nose and mouth. Keep your home clean  Disinfect objects and surfaces that are frequently touched every day. This may include: ? Counters and tables. ? Doorknobs and light switches. ? Sinks and faucets. ? Electronics such as phones, remote controls, keyboards, computers, and tablets.  Wash dishes in hot, soapy water or use a dishwasher. Air-dry your dishes.  Wash laundry in hot water. Prevent infecting other household members  Let healthy household members care for children and pets, if possible. If you have to care for children or pets, wash your hands often and wear a mask.  Sleep in a different bedroom or bed, if possible.  Do not share personal items, such as razors, toothbrushes, deodorant, combs, brushes, towels, and washcloths. Where to find more information Centers for Disease Control and Prevention (CDC)  Information and news updates: https://www.butler-gonzalez.com/ World Health Organization [facility])  Information and news updates: MissExecutive.com.ee  Coronavirus health topic:  https://www.castaneda.info/  Questions and answers on COVID-19: OpportunityDebt.at  Global tracker: who.sprinklr.com American Academy of Pediatrics (AAP)  Information for families: www.healthychildren.org/English/health-issues/conditions/chest-lungs/Pages/2019-Novel-Coronavirus.aspx The coronavirus situation is changing rapidly. Check your local health authority website or the CDC and [facility] websites for updates and news. When should I contact a health care provider?  Contact your health care provider if you have symptoms of an infection, such as fever or cough, and you: ? Have been near anyone who is known to have coronavirus disease. ? Have come into contact with a person who is suspected to have coronavirus disease. ? Have traveled to an area where there is an outbreak of COVID-19. When should I get emergency medical care?  Get help right away by calling your local emergency services (911 in the [place] if you have: ? Trouble breathing. ? Pain or pressure in your chest. ? Confusion. ? Blue-tinged lips and fingernails. ? Difficulty waking from sleep. ? Symptoms that get worse. Let the emergency medical personnel know if you think you  have coronavirus disease. Summary  A new respiratory virus is spreading from person to person and causing COVID-19 (coronavirus disease).  The virus that causes COVID-19 appears to spread easily. It spreads from one person to another through droplets from breathing, speaking, singing, coughing, or sneezing.  Older adults and those with chronic diseases are at higher risk of disease. If you are at higher risk for complications, take extra precautions.  There is currently no vaccine to prevent coronavirus disease. There are no medicines, such as antibiotics or antivirals, to treat the virus.  You can protect yourself and your family by washing your hands often, avoiding touching your face, and covering your coughs  and sneezes. This information is not intended to replace advice given to you by your health care provider. Make sure you discuss any questions you have with your health care provider. Document Revised: 05/21/2019 Document Reviewed: 11/17/2018 Elsevier Patient Education  [place]: Quarantine vs. Isolation QUARANTINE keeps someone who was in close contact with someone who has COVID-19 away from others. If you had close contact with a person who has COVID-19  Stay home until 14 days after your last contact.  Check your temperature twice a day and watch for symptoms of COVID-19.  If possible, stay away from people who are at higher-risk for getting very sick from COVID-19. ISOLATION keeps someone who is sick or tested positive for COVID-19 without symptoms away from others, even in their own home. If you are sick and think or know you have COVID-19  Stay home until after ? At least 10 days since symptoms first appeared and ? At least 24 hours with no fever without fever-reducing medication and ? Symptoms have improved If you tested positive for COVID-19 but do not have symptoms  Stay home until after ? 10 days have passed since your positive test If you live with others, stay in a specific "sick room" or area and away from other people or animals, including pets. Use a separate bathroom, if available. [email or web address].[email or web address] 02/22/2019 This information is not intended to replace advice given to you by your health care provider. Make sure you discuss any questions you have with your health care provider. Document Revised: 07/08/2019 Document Reviewed: 07/08/2019 Elsevier Patient Education  [place].  COVID-19: How to Protect Yourself and Others Know how it spreads  There is currently no vaccine to prevent coronavirus disease 2019 (COVID-19).  The best way to prevent illness is to avoid being exposed to this virus.  The virus is thought to spread mainly  from person-to-person. ? Between people who are in close contact with one another (within about 6 feet). ? Through respiratory droplets produced when an infected person coughs, sneezes or talks. ? These droplets can land in the mouths or noses of people who are nearby or possibly be inhaled into the lungs. ? COVID-19 may be spread by people who are not showing symptoms. Everyone should Clean your hands often  Wash your hands often with soap and water for at least 20 seconds especially after you have been in a public place, or after blowing your nose, coughing, or sneezing.  If soap and water are not readily available, use a hand sanitizer that contains at least 60% alcohol. Cover all surfaces of your hands and rub them together until they feel dry.  Avoid touching your eyes, nose, and mouth with unwashed hands. Avoid close contact  Limit contact with others as much as possible.  Avoid close  contact with people who are sick.  Put distance between yourself and other people. ? Remember that some people without symptoms may be able to spread virus. ? This is especially important for people who are at higher risk of getting very GainPain.com.cy Cover your mouth and nose with a mask when around others  You could spread COVID-19 to others even if you do not feel sick.  Everyone should wear a mask in public settings and when around people not living in their household, especially when social distancing is difficult to maintain. ? Masks should not be placed on young children under age 38, anyone who has trouble breathing, or is unconscious, incapacitated or otherwise unable to remove the mask without assistance.  The mask is meant to protect other people in case you are infected.  Do NOT use a facemask meant for a Dietitian.  Continue to keep about 6 feet between yourself and others. The mask is not a substitute  for social distancing. Cover coughs and sneezes  Always cover your mouth and nose with a tissue when you cough or sneeze or use the inside of your elbow.  Throw used tissues in the trash.  Immediately wash your hands with soap and water for at least 20 seconds. If soap and water are not readily available, clean your hands with a hand sanitizer that contains at least 60% alcohol. Clean and disinfect  Clean AND disinfect frequently touched surfaces daily. This includes tables, doorknobs, light switches, countertops, handles, desks, phones, keyboards, toilets, faucets, and sinks. [email or web address].fr  If surfaces are dirty, clean them: Use detergent or soap and water prior to disinfection.  Then, use a household disinfectant. You can see a list of EPA-registered household disinfectants here. [email or web address] 04/07/2019 This information is not intended to replace advice given to you by your health care provider. Make sure you discuss any questions you have with your health care provider. Document Revised: 04/15/2019 Document Reviewed: 02/11/2019 Elsevier Patient Education  [place].   IMPORTANT INFORMATION: PAY CLOSE ATTENTION   PHYSICIAN DISCHARGE INSTRUCTIONS  Follow with Primary care provider  [name], MD  and other consultants as instructed by your Hospitalist Physician  [place] IF SYMPTOMS COME BACK, WORSEN OR NEW PROBLEM DEVELOPS   Please note: You were cared for by a hospitalist during your hospital stay. Every effort will be made to forward records to your primary care provider.  You can request that your primary care provider send for your hospital records if they have not received them.  Once you are discharged, your primary care physician will handle any further medical issues. Please note that NO REFILLS for any discharge medications will be authorized once you  are discharged, as it is imperative that you return to your primary care physician (or establish a relationship with a primary care physician if you do not have one) for your post hospital discharge needs so that they can reassess your need for medications and monitor your lab values.  Please get a complete blood count and chemistry panel checked by your Primary MD at your next visit, and again as instructed by your Primary MD.  Get Medicines reviewed and adjusted: Please take all your medications with you for your next visit with your Primary MD  Laboratory/radiological data: Please request your Primary MD to go over all hospital tests and procedure/radiological results at the follow up, please ask your primary care provider to get all Hospital records sent  to his/her office.  In some cases, they will be blood work, cultures and biopsy results pending at the time of your discharge. Please request that your primary care provider follow up on these results.  If you are diabetic, please bring your blood sugar readings with you to your follow up appointment with primary care.    Please call and make your follow up appointments as soon as possible.    Also Note the following: If you experience worsening of your admission symptoms, develop shortness of breath, life threatening emergency, suicidal or homicidal thoughts you must seek medical attention immediately by calling 911 or calling your MD immediately  if symptoms less severe.  You must read complete instructions/literature along with all the possible adverse reactions/side effects for all the Medicines you take and that have been prescribed to you. Take any new Medicines after you have completely understood and accpet all the possible adverse reactions/side effects.   Do not drive when taking Pain medications or sleeping medications (Benzodiazepines)  Do not take more than prescribed Pain, Sleep and Anxiety Medications. It is not advisable to  combine anxiety,sleep and pain medications without talking with your primary care practitioner  Special Instructions: If you have smoked or chewed Tobacco  in the last 2 yrs please stop smoking, stop any regular Alcohol  and or any Recreational drug use.  Wear Seat belts while driving.  Do not drive if taking any narcotic, mind altering or controlled substances or recreational drugs or alcohol.

## 2020-02-27 LAB — GLUCOSE, CAPILLARY: Glucose-Capillary: 73 mg/dL (ref 70–99)

## 2020-02-28 ENCOUNTER — Other Ambulatory Visit: Payer: Self-pay | Admitting: *Deleted

## 2020-02-29 ENCOUNTER — Other Ambulatory Visit: Payer: Self-pay | Admitting: *Deleted

## 2020-02-29 ENCOUNTER — Encounter: Payer: Self-pay | Admitting: *Deleted

## 2020-02-29 NOTE — Patient Outreach (Signed)
[facility]) Care Management [name] Telephone Outreach, new referral Post-hospital discharge day # 3   02/29/2020  ALDIN DREES [birth year] [id number]  Successful telephone outreach to Lorie Phenix, son/ caregiver of Shoichi Mielke, 69 y/o male referred to [facility] 02/28/20 by [facility] RN CM after recent hospitalization July 20-24, 2021 for expressive aphasia, difficulty swallowing.  Patient had recent planned outpatient procedure for AV graft replacement for hemodialysis access on 02/17/20 and experienced subsequent aphasia and difficulty swallowing; patient was taken to ED post-procedure 02/17/20 and subsequently admitted to [facility] and ruled out (negative) for CVA.  He was discharged home to self care and re-presented to ED 02/22/20 for ongoing issues with difficulty swallowing; he was re-admitted after testing positive for (asymptomatic) corona virus post- being fully vaccinated for same; he was discharged home to self-care with home health services through [place].  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  HIPAA/ identity verified with patient's caregiver today and purpose of call/ [facility] CM services were discussed with caregiver.  Caregiver reports that he "may be" interested in [place] services but needs to discuss with patient first; states patient lives with him and will not typically engage with phone calls/ home visits-- states that he has heard from home health agency, but knows that patient may not agree to services.  Reports patient still makes his own decisions, but that he (caregiver) primarily handles all of patient's medical affairs.  Discussed that I would place [facility] CM letter in mail to them for their review and would re-attempt call back within 4 business days, unless they wish to contact me prior to then.  Son is agreeable and  states that he works full time as a Biomedical scientist and is somewhat limited around his work schedule; states that patient has been doing well post-hospital discharge and that he has scheduled PCP office visit next week.  -- son/ caregiver provides transportation for patient to all provider appointments -- son also receives hemodialysis and has arranged through hemodialysis center that he and patient go together and receive hemodialysis on same schedule -- son considering overall care needs of patient and possibility that patient may need placement in SNF "at some point" as he continues to age; son wishes to ensure that patient has appropriate level of care as his needs change.  Discussed with caregiver possibility of [facility] CSW referral if he wishes to pursue this option and is agreeable to participation in [place] program.   Caregiver denies further issues, concerns, or problems today.  I provided/ confirmed that he has my direct phone number, and encouraged caregiver to look for [facility] CM letter; encouraged caregiver to contact me at any time should he/ patient wish to participate in [place] program and explained that I would also contact him again in 4 business days if I do not hear back from them first- caregiver agreeable.  Plan:  Will keep patient as pending status with [name] per request of caregiver  Will re-attempt [name] telephone outreach within 4 business days if I do not hear back from patient/ caregiver first.  Oneta Rack, RN, BSN, [place] Coordinator [facility] Care Management  [phone]

## 2020-02-29 NOTE — Clinical Social Work Note (Signed)
Message left on 02/28/20 from [place] at [facility] regarding patient missing dialysis on 7/26 due to lack of transportation. Ronny Bacon stated that someone from transportation told patient that they were going to pick patient up on 02/28/20 at 4:30 but no one came up. Spoke with Di Kindle regarding transportation scheduled on 02/24/20 for transportation to dialysis on 7/26 and 7/28. She advised that they did not have a driver for 0/35 transport. They advised that that did have a driver for 4/65 transport. Di Kindle advised that her notes indicate that they had spoken to patient's on about not having a driver to transport on 7/26 and son verbalized understanding. TOC was not notified of transportation being unavailable for 7/26. Di Kindle advised that they notified patient's son. Transport is set up for 7/28 per [place].    Gabrielle Mester, Clydene Pugh, LCSW

## 2020-03-01 DIAGNOSIS — D631 Anemia in chronic kidney disease: Secondary | ICD-10-CM | POA: Diagnosis not present

## 2020-03-01 DIAGNOSIS — D509 Iron deficiency anemia, unspecified: Secondary | ICD-10-CM | POA: Diagnosis not present

## 2020-03-01 DIAGNOSIS — D472 Monoclonal gammopathy: Secondary | ICD-10-CM | POA: Diagnosis not present

## 2020-03-01 DIAGNOSIS — N186 End stage renal disease: Secondary | ICD-10-CM | POA: Diagnosis not present

## 2020-03-01 DIAGNOSIS — N2581 Secondary hyperparathyroidism of renal origin: Secondary | ICD-10-CM | POA: Diagnosis not present

## 2020-03-01 DIAGNOSIS — Z992 Dependence on renal dialysis: Secondary | ICD-10-CM | POA: Diagnosis not present

## 2020-03-04 DIAGNOSIS — N186 End stage renal disease: Secondary | ICD-10-CM | POA: Diagnosis not present

## 2020-03-04 DIAGNOSIS — Z992 Dependence on renal dialysis: Secondary | ICD-10-CM | POA: Diagnosis not present

## 2020-03-05 DIAGNOSIS — N186 End stage renal disease: Secondary | ICD-10-CM | POA: Diagnosis not present

## 2020-03-05 DIAGNOSIS — D631 Anemia in chronic kidney disease: Secondary | ICD-10-CM | POA: Diagnosis not present

## 2020-03-05 DIAGNOSIS — N2581 Secondary hyperparathyroidism of renal origin: Secondary | ICD-10-CM | POA: Diagnosis not present

## 2020-03-05 DIAGNOSIS — Z992 Dependence on renal dialysis: Secondary | ICD-10-CM | POA: Diagnosis not present

## 2020-03-05 DIAGNOSIS — D472 Monoclonal gammopathy: Secondary | ICD-10-CM | POA: Diagnosis not present

## 2020-03-06 ENCOUNTER — Encounter: Payer: Self-pay | Admitting: *Deleted

## 2020-03-06 ENCOUNTER — Other Ambulatory Visit: Payer: Self-pay | Admitting: *Deleted

## 2020-03-06 DIAGNOSIS — D472 Monoclonal gammopathy: Secondary | ICD-10-CM | POA: Diagnosis not present

## 2020-03-06 DIAGNOSIS — N2581 Secondary hyperparathyroidism of renal origin: Secondary | ICD-10-CM | POA: Diagnosis not present

## 2020-03-06 DIAGNOSIS — D631 Anemia in chronic kidney disease: Secondary | ICD-10-CM | POA: Diagnosis not present

## 2020-03-06 DIAGNOSIS — Z992 Dependence on renal dialysis: Secondary | ICD-10-CM | POA: Diagnosis not present

## 2020-03-06 DIAGNOSIS — N186 End stage renal disease: Secondary | ICD-10-CM | POA: Diagnosis not present

## 2020-03-06 NOTE — Patient Outreach (Signed)
[facility]) Care Management [name] Telephone Outreach, new referral Post-hospital discharge day # 9 Unsuccessful (consecutive) outreach attempt # 1, new referral  03/06/2020  SRIHARI SHELLHAMMER 05/29/1951 [id number]  Unsuccessful telephone outreach to Lorie Phenix, son/ caregiver of Loren Sawaya, 69 y/o male referred to [place] 02/28/20 by [facility] RN CM after recent hospitalization July 20-24, 2021 for expressive aphasia, difficulty swallowing.  Patient had recent planned outpatient procedure for AV graft replacement for hemodialysis access on 02/17/20 and experienced subsequent aphasia and difficulty swallowing; patient was taken to ED post-procedure 02/17/20 and subsequently admitted to [facility] and ruled out (negative) for CVA.  He was discharged home to self care and re-presented to ED 02/22/20 for ongoing issues with difficulty swallowing; he was re-admitted after testing positive for (asymptomatic) corona virus post- being fully vaccinated for same; he was discharged home to self-care with home health services through [place].  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  HIPAA compliant voice mail message left for patient/ caregiver, requesting return call back.  Plan:  Verified [name] Successful outreach letter placed in mail to patient at time of initial outreach with caregiver (February 29, 2020)  Will keep patient as pending status with [facility] CM per request of caregiver  Will re-attempt [name] telephone outreach within 4 business days if I do not hear back from patient/ caregiver first.  Oneta Rack, RN, BSN, [place] Coordinator [facility] Care Management  [phone]

## 2020-03-08 DIAGNOSIS — Z992 Dependence on renal dialysis: Secondary | ICD-10-CM | POA: Diagnosis not present

## 2020-03-08 DIAGNOSIS — D631 Anemia in chronic kidney disease: Secondary | ICD-10-CM | POA: Diagnosis not present

## 2020-03-08 DIAGNOSIS — N2581 Secondary hyperparathyroidism of renal origin: Secondary | ICD-10-CM | POA: Diagnosis not present

## 2020-03-08 DIAGNOSIS — D472 Monoclonal gammopathy: Secondary | ICD-10-CM | POA: Diagnosis not present

## 2020-03-08 DIAGNOSIS — N186 End stage renal disease: Secondary | ICD-10-CM | POA: Diagnosis not present

## 2020-03-09 DIAGNOSIS — I10 Essential (primary) hypertension: Secondary | ICD-10-CM | POA: Diagnosis not present

## 2020-03-09 DIAGNOSIS — R1310 Dysphagia, unspecified: Secondary | ICD-10-CM | POA: Diagnosis not present

## 2020-03-09 DIAGNOSIS — Z09 Encounter for follow-up examination after completed treatment for conditions other than malignant neoplasm: Secondary | ICD-10-CM | POA: Diagnosis not present

## 2020-03-09 DIAGNOSIS — I4891 Unspecified atrial fibrillation: Secondary | ICD-10-CM | POA: Diagnosis not present

## 2020-03-09 DIAGNOSIS — U071 COVID-19: Secondary | ICD-10-CM | POA: Diagnosis not present

## 2020-03-10 ENCOUNTER — Encounter: Payer: Self-pay | Admitting: *Deleted

## 2020-03-10 ENCOUNTER — Other Ambulatory Visit: Payer: Self-pay | Admitting: *Deleted

## 2020-03-10 DIAGNOSIS — D472 Monoclonal gammopathy: Secondary | ICD-10-CM | POA: Diagnosis not present

## 2020-03-10 DIAGNOSIS — D631 Anemia in chronic kidney disease: Secondary | ICD-10-CM | POA: Diagnosis not present

## 2020-03-10 DIAGNOSIS — Z992 Dependence on renal dialysis: Secondary | ICD-10-CM | POA: Diagnosis not present

## 2020-03-10 DIAGNOSIS — N2581 Secondary hyperparathyroidism of renal origin: Secondary | ICD-10-CM | POA: Diagnosis not present

## 2020-03-10 DIAGNOSIS — N186 End stage renal disease: Secondary | ICD-10-CM | POA: Diagnosis not present

## 2020-03-10 NOTE — Patient Outreach (Signed)
[facility]) Care Management [name] Telephone Outreach, new referral Post-hospital discharge day # 13 Unsuccessful (consecutive) outreach attempt # 2  03/10/2020  [name] 1951/01/23 [id number]  Unsuccessful (consecutive) second telephone outreach [name], son/ caregiver ofVernon Liu, 69 y/o male referred to [place] 02/28/20 by [facility] RN CM after recent hospitalization July 20-24, 2036fr expressive aphasia, difficulty swallowing. Patient had recent planned outpatient procedure for AV graft replacement for hemodialysis access on 02/17/20 and experienced subsequent aphasia and difficulty swallowing; patient was taken to ED post-procedure 02/17/20 [facility]. He was discharged home to self care and re-presented to ED 02/22/20 for ongoing issues with difficulty swallowing; he was re-admitted after testing positive for (asymptomatic) corona virus post- being fully vaccinated for same; he was discharged home to self-care [place] health [place]  Patient has history including, but not limited toESRD on HD M-W-F; permanent A-Fib without ACT due to history of GI bleed; HTN/ HLD; pulmonary HTN; multiple myeloma; blindness.  HIPAA compliant voice mail message left for patient/ caregiver, requesting return call back.  Plan:  Verified [name] Successful outreach letter placed in mail to patient at time of initial outreach with caregiver (February 29, 2020)  Will [facility] as [facility] request of caregiver at time of initial outreach  Will [facility] outreachwithin 4 business daysif I do not hear back from patient/ cElliott RReiffton BSN, CBolivar PeninsulaCoordinator [facility]John[facility] Hospital[facility] Management  ([phone]

## 2020-03-12 DIAGNOSIS — D631 Anemia in chronic kidney disease: Secondary | ICD-10-CM | POA: Diagnosis not present

## 2020-03-12 DIAGNOSIS — D472 Monoclonal gammopathy: Secondary | ICD-10-CM | POA: Diagnosis not present

## 2020-03-12 DIAGNOSIS — N2581 Secondary hyperparathyroidism of renal origin: Secondary | ICD-10-CM | POA: Diagnosis not present

## 2020-03-12 DIAGNOSIS — N186 End stage renal disease: Secondary | ICD-10-CM | POA: Diagnosis not present

## 2020-03-12 DIAGNOSIS — Z992 Dependence on renal dialysis: Secondary | ICD-10-CM | POA: Diagnosis not present

## 2020-03-13 DIAGNOSIS — Z992 Dependence on renal dialysis: Secondary | ICD-10-CM | POA: Diagnosis not present

## 2020-03-13 DIAGNOSIS — D631 Anemia in chronic kidney disease: Secondary | ICD-10-CM | POA: Diagnosis not present

## 2020-03-13 DIAGNOSIS — N186 End stage renal disease: Secondary | ICD-10-CM | POA: Diagnosis not present

## 2020-03-13 DIAGNOSIS — D472 Monoclonal gammopathy: Secondary | ICD-10-CM | POA: Diagnosis not present

## 2020-03-13 DIAGNOSIS — N2581 Secondary hyperparathyroidism of renal origin: Secondary | ICD-10-CM | POA: Diagnosis not present

## 2020-03-14 ENCOUNTER — Encounter: Payer: Self-pay | Admitting: *Deleted

## 2020-03-14 ENCOUNTER — Encounter: Payer: Medicare Other | Admitting: Vascular Surgery

## 2020-03-14 ENCOUNTER — Other Ambulatory Visit: Payer: Self-pay | Admitting: *Deleted

## 2020-03-14 DIAGNOSIS — R1312 Dysphagia, oropharyngeal phase: Secondary | ICD-10-CM | POA: Diagnosis not present

## 2020-03-14 NOTE — Patient Outreach (Signed)
[facility]) Care Management [name] Telephone Outreach, new referral Post-hospital discharge day # 17 Unsuccessful (consecutive) third outreach attempt without patient/ caregiver call back      03/14/2020  [name] 10/10/1950 [id number]  Unsuccessful (consecutive) third telephone outreach [name], son/ caregiver ofVernon Liu, 69 y/o male referred to [place] 02/28/20 by [facility] RN CM after recent hospitalization July 20-24, 2075fr expressive aphasia, difficulty swallowing. Patient had recent planned outpatient procedure for AV graft replacement for hemodialysis access on 02/17/20 and experienced subsequent aphasia and difficulty swallowing; patient was taken to ED post-procedure 02/17/20 [facility]. He was discharged home to self care and re-presented to ED 02/22/20 for ongoing issues with difficulty swallowing; he was re-admitted after testing positive for (asymptomatic) corona virus post- being fully vaccinated for same; he was discharged home to self-care [place] health [place]  Patient has history including, but not limited toESRD on HD M-W-F; permanent A-Fib without ACT due to history of GI bleed; HTN/ HLD; pulmonary HTN; multiple myeloma; blindness.  HIPAA compliant voice mail message left for patient/ caregiver, requesting return call back.  Plan:  Verified [name] Successful outreach letter placed in mail to patient at time of initial outreach with caregiver (February 29, 2020)  Will keep patient as pending status with [name] and will re-attempt [name] telephone outreachwithin 3 weeks for final outreach attempt if I do not hear back from [place] RFox Chase BSN, CHammondCoordinator TTyr[facility] HospitalCare Management  ([phone]

## 2020-03-15 ENCOUNTER — Other Ambulatory Visit: Payer: Self-pay

## 2020-03-15 ENCOUNTER — Emergency Department (HOSPITAL_COMMUNITY): Payer: Medicare Other

## 2020-03-15 ENCOUNTER — Inpatient Hospital Stay (HOSPITAL_COMMUNITY)
Admission: EM | Admit: 2020-03-15 | Discharge: 2020-03-21 | DRG: 640 | Disposition: A | Discharge status: Skilled Nursing Facility | Payer: Medicare Other | Attending: Family Medicine | Admitting: Family Medicine

## 2020-03-15 ENCOUNTER — Encounter (HOSPITAL_COMMUNITY): Payer: Self-pay | Admitting: Emergency Medicine

## 2020-03-15 DIAGNOSIS — Z885 Allergy status to narcotic agent status: Secondary | ICD-10-CM

## 2020-03-15 DIAGNOSIS — D61818 Other pancytopenia: Secondary | ICD-10-CM | POA: Diagnosis present

## 2020-03-15 DIAGNOSIS — I4821 Permanent atrial fibrillation: Secondary | ICD-10-CM | POA: Diagnosis present

## 2020-03-15 DIAGNOSIS — I69320 Aphasia following cerebral infarction: Secondary | ICD-10-CM | POA: Diagnosis not present

## 2020-03-15 DIAGNOSIS — E039 Hypothyroidism, unspecified: Secondary | ICD-10-CM | POA: Diagnosis present

## 2020-03-15 DIAGNOSIS — E162 Hypoglycemia, unspecified: Secondary | ICD-10-CM | POA: Diagnosis not present

## 2020-03-15 DIAGNOSIS — Z79899 Other long term (current) drug therapy: Secondary | ICD-10-CM | POA: Diagnosis not present

## 2020-03-15 DIAGNOSIS — Z8673 Personal history of transient ischemic attack (TIA), and cerebral infarction without residual deficits: Secondary | ICD-10-CM | POA: Diagnosis not present

## 2020-03-15 DIAGNOSIS — Z20822 Contact with and (suspected) exposure to covid-19: Secondary | ICD-10-CM | POA: Diagnosis not present

## 2020-03-15 DIAGNOSIS — D472 Monoclonal gammopathy: Secondary | ICD-10-CM | POA: Diagnosis present

## 2020-03-15 DIAGNOSIS — I9589 Other hypotension: Secondary | ICD-10-CM | POA: Diagnosis present

## 2020-03-15 DIAGNOSIS — N186 End stage renal disease: Secondary | ICD-10-CM | POA: Diagnosis present

## 2020-03-15 DIAGNOSIS — Z8616 Personal history of COVID-19: Secondary | ICD-10-CM | POA: Diagnosis not present

## 2020-03-15 DIAGNOSIS — I272 Pulmonary hypertension, unspecified: Secondary | ICD-10-CM | POA: Diagnosis present

## 2020-03-15 DIAGNOSIS — K30 Functional dyspepsia: Secondary | ICD-10-CM | POA: Diagnosis present

## 2020-03-15 DIAGNOSIS — Z452 Encounter for adjustment and management of vascular access device: Secondary | ICD-10-CM | POA: Diagnosis not present

## 2020-03-15 DIAGNOSIS — Z923 Personal history of irradiation: Secondary | ICD-10-CM

## 2020-03-15 DIAGNOSIS — I10 Essential (primary) hypertension: Secondary | ICD-10-CM | POA: Diagnosis present

## 2020-03-15 DIAGNOSIS — R634 Abnormal weight loss: Secondary | ICD-10-CM | POA: Diagnosis present

## 2020-03-15 DIAGNOSIS — I4891 Unspecified atrial fibrillation: Secondary | ICD-10-CM | POA: Diagnosis present

## 2020-03-15 DIAGNOSIS — N2581 Secondary hyperparathyroidism of renal origin: Secondary | ICD-10-CM | POA: Diagnosis present

## 2020-03-15 DIAGNOSIS — Z940 Kidney transplant status: Secondary | ICD-10-CM | POA: Diagnosis not present

## 2020-03-15 DIAGNOSIS — I340 Nonrheumatic mitral (valve) insufficiency: Secondary | ICD-10-CM | POA: Diagnosis present

## 2020-03-15 DIAGNOSIS — R627 Adult failure to thrive: Secondary | ICD-10-CM | POA: Diagnosis not present

## 2020-03-15 DIAGNOSIS — Z79890 Hormone replacement therapy (postmenopausal): Secondary | ICD-10-CM

## 2020-03-15 DIAGNOSIS — E785 Hyperlipidemia, unspecified: Secondary | ICD-10-CM | POA: Diagnosis present

## 2020-03-15 DIAGNOSIS — Z8701 Personal history of pneumonia (recurrent): Secondary | ICD-10-CM

## 2020-03-15 DIAGNOSIS — I509 Heart failure, unspecified: Secondary | ICD-10-CM | POA: Diagnosis present

## 2020-03-15 DIAGNOSIS — Z992 Dependence on renal dialysis: Secondary | ICD-10-CM | POA: Diagnosis not present

## 2020-03-15 DIAGNOSIS — Z9115 Patient's noncompliance with renal dialysis: Secondary | ICD-10-CM

## 2020-03-15 DIAGNOSIS — I959 Hypotension, unspecified: Secondary | ICD-10-CM | POA: Diagnosis not present

## 2020-03-15 DIAGNOSIS — Z681 Body mass index (BMI) 19 or less, adult: Secondary | ICD-10-CM

## 2020-03-15 DIAGNOSIS — Z431 Encounter for attention to gastrostomy: Secondary | ICD-10-CM | POA: Diagnosis not present

## 2020-03-15 DIAGNOSIS — I953 Hypotension of hemodialysis: Secondary | ICD-10-CM | POA: Diagnosis present

## 2020-03-15 DIAGNOSIS — R1311 Dysphagia, oral phase: Secondary | ICD-10-CM | POA: Diagnosis not present

## 2020-03-15 DIAGNOSIS — D631 Anemia in chronic kidney disease: Secondary | ICD-10-CM | POA: Diagnosis present

## 2020-03-15 DIAGNOSIS — Z743 Need for continuous supervision: Secondary | ICD-10-CM | POA: Diagnosis not present

## 2020-03-15 DIAGNOSIS — K219 Gastro-esophageal reflux disease without esophagitis: Secondary | ICD-10-CM | POA: Diagnosis not present

## 2020-03-15 DIAGNOSIS — R0902 Hypoxemia: Secondary | ICD-10-CM | POA: Diagnosis not present

## 2020-03-15 DIAGNOSIS — R1310 Dysphagia, unspecified: Secondary | ICD-10-CM | POA: Diagnosis not present

## 2020-03-15 DIAGNOSIS — Z931 Gastrostomy status: Secondary | ICD-10-CM | POA: Diagnosis not present

## 2020-03-15 DIAGNOSIS — U071 COVID-19: Secondary | ICD-10-CM | POA: Diagnosis present

## 2020-03-15 DIAGNOSIS — D62 Acute posthemorrhagic anemia: Secondary | ICD-10-CM | POA: Diagnosis present

## 2020-03-15 DIAGNOSIS — H409 Unspecified glaucoma: Secondary | ICD-10-CM | POA: Diagnosis present

## 2020-03-15 DIAGNOSIS — Z800 Family history of malignant neoplasm of digestive organs: Secondary | ICD-10-CM

## 2020-03-15 DIAGNOSIS — I132 Hypertensive heart and chronic kidney disease with heart failure and with stage 5 chronic kidney disease, or end stage renal disease: Secondary | ICD-10-CM | POA: Diagnosis not present

## 2020-03-15 DIAGNOSIS — R262 Difficulty in walking, not elsewhere classified: Secondary | ICD-10-CM | POA: Diagnosis not present

## 2020-03-15 DIAGNOSIS — R1314 Dysphagia, pharyngoesophageal phase: Secondary | ICD-10-CM | POA: Diagnosis not present

## 2020-03-15 DIAGNOSIS — M6281 Muscle weakness (generalized): Secondary | ICD-10-CM | POA: Diagnosis not present

## 2020-03-15 DIAGNOSIS — E46 Unspecified protein-calorie malnutrition: Secondary | ICD-10-CM | POA: Diagnosis not present

## 2020-03-15 DIAGNOSIS — H40003 Preglaucoma, unspecified, bilateral: Secondary | ICD-10-CM | POA: Diagnosis not present

## 2020-03-15 DIAGNOSIS — C9000 Multiple myeloma not having achieved remission: Secondary | ICD-10-CM | POA: Diagnosis present

## 2020-03-15 DIAGNOSIS — R1312 Dysphagia, oropharyngeal phase: Secondary | ICD-10-CM | POA: Diagnosis not present

## 2020-03-15 DIAGNOSIS — I517 Cardiomegaly: Secondary | ICD-10-CM | POA: Diagnosis not present

## 2020-03-15 LAB — COMPREHENSIVE METABOLIC PANEL
ALT: 7 U/L (ref 0–44)
AST: 13 U/L — ABNORMAL LOW (ref 15–41)
Albumin: 3 g/dL — ABNORMAL LOW (ref 3.5–5.0)
Alkaline Phosphatase: 42 U/L (ref 38–126)
Anion gap: 15 (ref 5–15)
BUN: 21 mg/dL (ref 8–23)
CO2: 23 mmol/L (ref 22–32)
Calcium: 8.8 mg/dL — ABNORMAL LOW (ref 8.9–10.3)
Chloride: 99 mmol/L (ref 98–111)
Creatinine, Ser: 5.9 mg/dL — ABNORMAL HIGH (ref 0.61–1.24)
GFR calc Af Amer: 10 mL/min — ABNORMAL LOW (ref >60)
GFR calc non Af Amer: 9 mL/min — ABNORMAL LOW (ref >60)
Glucose, Bld: 64 mg/dL — ABNORMAL LOW (ref 70–99)
Potassium: 3.1 mmol/L — ABNORMAL LOW (ref 3.5–5.1)
Sodium: 137 mmol/L (ref 135–145)
Total Bilirubin: 1.2 mg/dL (ref 0.3–1.2)
Total Protein: 6.5 g/dL (ref 6.5–8.1)

## 2020-03-15 LAB — CBG MONITORING, ED: Glucose-Capillary: 82 mg/dL (ref 70–99)

## 2020-03-15 LAB — CBC
HCT: 26.8 % — ABNORMAL LOW (ref 39.0–52.0)
Hemoglobin: 8 g/dL — ABNORMAL LOW (ref 13.0–17.0)
MCH: 27.4 pg (ref 26.0–34.0)
MCHC: 29.9 g/dL — ABNORMAL LOW (ref 30.0–36.0)
MCV: 91.8 fL (ref 80.0–100.0)
Platelets: 146 10*3/uL — ABNORMAL LOW (ref 150–400)
RBC: 2.92 MIL/uL — ABNORMAL LOW (ref 4.22–5.81)
RDW: 15.7 % — ABNORMAL HIGH (ref 11.5–15.5)
WBC: 3.2 10*3/uL — ABNORMAL LOW (ref 4.0–10.5)
nRBC: 0 % (ref 0.0–0.2)

## 2020-03-15 LAB — MAGNESIUM: Magnesium: 1.9 mg/dL (ref 1.7–2.4)

## 2020-03-15 MED ORDER — ONDANSETRON HCL 4 MG PO TABS: 4.0000 mg | ORAL_TABLET | Freq: Four times a day (QID) | ORAL | Status: DC | PRN

## 2020-03-15 MED ORDER — KCL IN DEXTROSE-NACL 40-5-0.9 MEQ/L-%-% IV SOLN
INTRAVENOUS | Status: DC
  Filled 2020-03-15 (×4): qty 1000

## 2020-03-15 MED ORDER — ONDANSETRON HCL 4 MG/2ML IJ SOLN
4.0000 mg | Freq: Four times a day (QID) | INTRAMUSCULAR | Status: DC | PRN
  Administered 2020-03-19: 4 mg via INTRAVENOUS
  Filled 2020-03-15: qty 2

## 2020-03-15 MED ORDER — ACETAMINOPHEN 650 MG RE SUPP: 650.0000 mg | Freq: Four times a day (QID) | RECTAL | Status: DC | PRN

## 2020-03-15 MED ORDER — DEXTROSE 50 % IV SOLN
1.0000 | Freq: Once | INTRAVENOUS | Status: AC
  Administered 2020-03-15: 50 mL via INTRAVENOUS
  Filled 2020-03-15: qty 50

## 2020-03-15 MED ORDER — SODIUM CHLORIDE 0.9 % IV BOLUS
1000.0000 mL | Freq: Once | INTRAVENOUS | Status: AC
  Administered 2020-03-15: 1000 mL via INTRAVENOUS

## 2020-03-15 MED ORDER — DORZOLAMIDE HCL-TIMOLOL MAL 2-0.5 % OP SOLN: 1.0000 [drp] | Freq: Two times a day (BID) | OPHTHALMIC | Status: DC

## 2020-03-15 MED ORDER — SODIUM CHLORIDE 0.9 % IV BOLUS
500.0000 mL | Freq: Once | INTRAVENOUS | Status: AC
  Administered 2020-03-15: 500 mL via INTRAVENOUS

## 2020-03-15 MED ORDER — ACETAMINOPHEN 325 MG PO TABS
650.0000 mg | ORAL_TABLET | Freq: Four times a day (QID) | ORAL | Status: DC | PRN
  Administered 2020-03-20 (×2): 650 mg via ORAL
  Filled 2020-03-15 (×3): qty 2

## 2020-03-15 NOTE — ED Notes (Signed)
Called AC for IVF 

## 2020-03-15 NOTE — ED Provider Notes (Signed)
[facility] EMERGENCY DEPARTMENT Provider Note   CSN: [id number] Arrival date & time: 03/15/20  1217     History Chief Complaint  Patient presents with  . Dysphagia    Brian Liu is a 69 y.o. male with a history of hypertension, atrial fibrillation, CHF, pulmonary hypertension, multiple myeloma, end-stage renal disease on dialysis with his last treatment occurring 2 days ago, also having problems with dysphagia presenting with a 2-day history of inability to swallow any fluids or solid foods.  He was admitted for the same problem last month, first at [place], then here during which time he had a stroke work-up which was negative.  He also had an EGD during that hospitalization (at [facility])  which was unremarkable.  He was discharged home from here with recommendation of thickened foods.  He has been able to tolerate small quantities of food, but this stopped 3 days ago.  Has also been undergoing home speech therapy.  He describes been able to swallow partially, the food bolus gets caught mid throat, and he has to cough and regurgitate the food.  His son at the bedside also gives additional history including 20 pound weight loss over the past month secondary to poor intake.  With his last admission, it was recommended that he have a feeding tube placed at which time he was not willing to undergo this, however with his current inability to tolerate any intake he is resigned to having this completed.  He does note neck and back pain which is a chronic finding.   Of note with his hospitalization he was Covid positive.  He is fully vaccinated for COVID-19 and he denies any symptoms suggesting this infection.  The history is provided by the patient.       Past Medical History:  Diagnosis Date  . Atrial fibrillation (HCC)    Permanent, Not on Coumadin because of history of lower GI bleed  . Diastolic dysfunction    Restrictive  diastolic filling pattern echo,  February, 2013  .  Dyslipidemia   . Ejection fraction    EF 60-65%, echo, 2013  . ESRD (end stage renal disease) ([place])    Dialysis, fistula in left arm, M-W-F  . Glaucoma   . Hypertension   . Lower gastrointestinal bleed   . LVH (left ventricular hypertrophy)   . 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 Mitral regurgitation    Mild by echo, 2013  . Multiple myeloma ([place]) 04/30/2016  . Pancytopenia ([place])    Dr Ignacia Bayley  . Pulmonary hypertension (HCC)    Severe, PA pressure 65-70 mm of mercury, echo, 2013  . Tobacco abuse     Patient Active Problem List   Diagnosis Date Noted  . COVID-19 virus infection 02/24/2020  . Dysphagia 02/23/2020  . Primary osteoarthritis, left shoulder 06/18/2018  . Venous stenosis of left upper extremity 04/03/2017  . Elevated troponin   . Dehydration 12/24/2016  . ESRD on hemodialysis ([place]) 12/24/2016  . Nausea vomiting and diarrhea 12/24/2016  . Enteritis 12/04/2016  . Thrush 12/04/2016  . Pancreatitis 12/02/2016  . Hypokalemia 12/02/2016  . Anemia 12/02/2016  . Abdominal pain   . Multiple myeloma ([place]) 04/30/2016  . 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 Complication of dialysis access insertion 05/17/2014  . ESRD (end stage renal disease) ([place])   . Atrial fibrillation ([place])   . Hypertension   . Pulmonary hypertension ([place])   . Mitral  regurgitation   . Tobacco abuse   . Dyslipidemia   . Lower gastrointestinal bleed   . Pancytopenia ([place])   . Ejection fraction   . Diastolic dysfunction   . LVH (left ventricular hypertrophy)     Past Surgical History:  Procedure Laterality Date  . A/V FISTULAGRAM Left 04/03/2017   Procedure: A/V Fistulagram;  Surgeon: Conrad Heidlersburg, MD;  Location: [place] CV LAB;  Service: Cardiovascular;  Laterality: Left;  . A/V FISTULAGRAM Left 08/06/2018   Procedure: A/V FISTULAGRAM;  Surgeon: [name], MD;  Location: [place]  CV LAB;  Service: Cardiovascular;  Laterality: Left;  . AV FISTULA PLACEMENT Left 1992   Left arm   . AV FISTULA PLACEMENT Right 02/17/2020   Procedure: INSERTION OF RIGHT ARM  ARTERIOVENOUS (AV)  GRAFT;  Surgeon: [name], MD;  Location: [place];  Service: Vascular;  Laterality: Right;  . IR FLUORO GUIDE CV LINE RIGHT  06/23/2018  . IR FLUORO GUIDE CV LINE RIGHT  12/28/2019  . IR FLUORO GUIDE CV LINE RIGHT  01/20/2020  . IR PTA VENOUS EXCEPT DIALYSIS CIRCUIT  01/20/2020  . IR REMOVAL TUN CV CATH W/O FL  08/20/2018  . IR US GUIDE VASC ACCESS RIGHT  06/23/2018  . IR US GUIDE VASC ACCESS RIGHT  12/28/2019  . IR VENOCAVAGRAM SVC  01/20/2020  . 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 PERIPHERAL VASCULAR BALLOON ANGIOPLASTY Left 08/06/2018   Procedure: PERIPHERAL VASCULAR BALLOON ANGIOPLASTY;  Surgeon: [name], MD;  Location: [place] CV LAB;  Service: Cardiovascular;  Laterality: Left;       Family History  Problem Relation Age of Onset  . Other Mother        Deceased, 74  . Stomach cancer Father        Deceased, 45  . Kidney failure Son   . Healthy Son     Social History   Tobacco Use  . Smoking status: Never Smoker  . Smokeless tobacco: Never Used  Vaping Use  . Vaping Use: Never used  Substance Use Topics  . Alcohol use: No    Alcohol/week: 0.0 standard drinks  . Drug use: No    Home Medications Prior to Admission medications   Medication Sig Start Date End Date Taking? Authorizing Provider  cinacalcet (SENSIPAR) 30 MG tablet Take 30 mg by mouth at bedtime.    Yes [provider]  dorzolamide-timolol (COSOPT) 22.3-6.8 MG/ML ophthalmic solution Place 1 drop into  both eyes 2 (two) times daily.    Yes [provider]  ferric citrate (AURYXIA) 1 GM 210 MG(Fe) tablet Take 210 mg by mouth 3 (three) times daily with meals.   Yes [provider]  gabapentin (NEURONTIN) 100 MG capsule Take 100 mg by mouth 2 (two) times daily.  06/14/18  Yes [provider]  hydrOXYzine (ATARAX/VISTARIL) 25 MG tablet Take 25 mg by mouth daily as needed for anxiety or itching.    Yes [provider]  Latanoprostene Bunod (VYZULTA) 0.024 % SOLN Place 1 drop into both eyes at bedtime.   Yes [provider]  megestrol (MEGACE) 20 MG tablet Take 20 mg by mouth daily.   Yes [provider]  midodrine (PROAMATINE) 10 MG tablet Take 15 mg by mouth in the morning and at bedtime.  04/10/17  Yes [provider]  mirtazapine (REMERON) 7.5 MG tablet Take 7.5 mg by mouth at bedtime. 04/01/17  Yes [provider]  simvastatin (ZOCOR) 20 MG tablet Take 20 mg by mouth daily.   Yes [provider]  bimatoprost (LUMIGAN) 0.03 % ophthalmic solution Place 1 drop into both eyes at bedtime. Patient not taking: Reported on 03/15/2020    [provider]  folic acid (FOLVITE) 1 MG tablet Take 1 mg by mouth daily. Patient not taking: Reported on 03/15/2020    [provider]  folic acid-vitamin b complex-vitamin c-selenium-zinc (DIALYVITE) 3 MG TABS tablet Take 1 tablet by mouth daily. Patient not taking: Reported on 03/15/2020    [provider]  lanthanum (FOSRENOL) 1000 MG chewable tablet Chew 500-1,000 mg by mouth See admin instructions. Chew 1025m twice daily with meals and 5055mwith snacks Patient not taking: Reported on 03/15/2020    [provider]  levothyroxine (SYNTHROID) 137 MCG tablet Take 137 mcg by mouth daily before breakfast. Patient not taking: Reported on 03/15/2020    [provider]  oxyCODONE-acetaminophen (PERCOCET) 5-325 MG tablet Take 1 tablet by mouth every 4  (four) hours as needed for severe pain. Patient not taking: Reported on 03/15/2020 02/17/20 02/16/21  BaKaroline CaldwellPA-C    Allergies    Hydrocodone  Review of Systems   Review of Systems  Constitutional: Positive for appetite change and unexpected weight change. Negative for fever.  HENT: Positive for trouble swallowing. Negative for congestion, drooling and sore throat.   Eyes: Negative.   Respiratory: Negative for chest tightness and shortness of breath.   Cardiovascular: Negative for chest pain.  Gastrointestinal: Negative for abdominal pain, constipation, diarrhea, nausea and vomiting.  Genitourinary: Negative.   Musculoskeletal: Positive for arthralgias. Negative for joint swelling and neck pain.       Chronic neck pain  Skin: Negative.  Negative for rash and wound.  Neurological: Positive for weakness. Negative for dizziness, light-headedness, numbness and headaches.  Psychiatric/Behavioral: Negative.     Physical Exam Updated Vital Signs BP 108/86 (BP Location: Other (Comment)) Comment (BP Location): right ankle  Pulse (!) 101   Temp (!) 97.5 F (36.4 C) (Tympanic)   Resp 20   Ht _0  (1.905 m)   Wt 54.4 kg   BMI 15.00 kg/m   Physical Exam Vitals and nursing note reviewed.  Constitutional:      Appearance: He is well-developed.  HENT:     Head: Normocephalic and atraumatic.  Eyes:     Conjunctiva/sclera: Conjunctivae normal.  Cardiovascular:     Rate and Rhythm: Normal rate and regular rhythm.     Heart sounds: Normal heart sounds.  Pulmonary:     Effort: Pulmonary effort is normal.     Breath sounds: Normal breath sounds. No wheezing.  Abdominal:     General: Bowel sounds are normal.     Palpations: Abdomen is soft.     Tenderness: There is no abdominal tenderness.  Musculoskeletal:        General: Normal range of motion.     Cervical back: Normal range of motion.  Skin:    General: Skin is warm and dry.  Neurological:     Mental Status: He is  alert.     ED Results / Procedures / Treatments   Labs (all labs ordered are listed, but only abnormal results  are displayed) Labs Reviewed  CBC - Abnormal; Notable for the following components:      Result Value   WBC 3.2 (*)    RBC 2.92 (*)    Hemoglobin 8.0 (*)    HCT 26.8 (*)    MCHC 29.9 (*)    RDW 15.7 (*)    Platelets 146 (*)    All other components within normal limits  COMPREHENSIVE METABOLIC PANEL - Abnormal; Notable for the following components:   Potassium 3.1 (*)    Glucose, Bld 64 (*)    Creatinine, Ser 5.90 (*)    Calcium 8.8 (*)    Albumin 3.0 (*)    AST 13 (*)    GFR calc non Af Amer 9 (*)    GFR calc Af Amer 10 (*)    All other components within normal limits    EKG EKG Interpretation  Date/Time:  Wednesday March 15 2020 13:11:03 EDT Ventricular Rate:  88 PR Interval:    QRS Duration: 110 QT Interval:  405 QTC Calculation: 490 R Axis:   -91 Text Interpretation: Atrial fibrillation Left anterior fascicular block Probable right ventricular hypertrophy Nonspecific T abnrm, anterolateral leads Borderline prolonged QT interval Confirmed by Veryl Speak [phone]) on 03/15/2020 2:46:41 PM   Radiology DG Chest Portable 1 View  Result Date: 03/15/2020 CLINICAL DATA:  Difficulty swallowing for 2 days. The patient tested positive for COVID-19 02/22/2020. EXAM: PORTABLE CHEST 1 VIEW COMPARISON:  Single-view of the chest 02/23/2020. PA and lateral chest 12/05/2016. FINDINGS: The lungs are clear. There is cardiomegaly and aortic atherosclerosis. A few calcified pleural plaques are noted. Right IJ approach dialysis catheter is in place and the patient is status post right shoulder replacement. IMPRESSION: No acute disease. Cardiomegaly. Aortic Atherosclerosis (ICD10-I70.0). Electronically Signed   By: Inge Rise M.D.   On: 03/15/2020 14:58    Procedures Procedures (including critical care time)  Medications Ordered in ED Medications  sodium chloride 0.9 %  bolus 500 mL (0 mLs Intravenous Stopped 03/15/20 1627)  dextrose 50 % solution 50 mL (50 mLs Intravenous Given 03/15/20 1807)    ED Course  I have reviewed the triage vital signs and the nursing notes.  Pertinent labs & imaging results that were available during my care of the patient were reviewed by me and considered in my medical decision making (see chart for details).    MDM Rules/Calculators/A&P                          Imaging negative for aspiration.  Labs significant for low potassium at 3.1, he also has a low glucose at 64. Given d50.  His creatinine is 5.9 which is consistent with his end-stage renal disease.  Hemoglobin is 8.0 which is stable.  His albumin level was 3 .0 which is low, suggesting malnutrition.   Spoke with Dr. Arnoldo Morale with general surgery who will follow pt - will plan PEG tube placement, possibly tomorrow or Friday.   Discussed with Dr. Denton Brick who accepts pt for admission.  Final Clinical Impression(s) / ED Diagnoses Final diagnoses:  Oropharyngeal dysphagia  Hypoglycemia  Malnutrition, unspecified type [facility])    Rx / DC Orders ED Discharge Orders    None       Landis Martins 03/15/20 1813    Veryl Speak, MD 03/16/20 1427

## 2020-03-15 NOTE — H&P (Signed)
History and Physical    [name] HDQ:[id number] DOB: [birth year] DOA: 03/15/2020  PCP: Brian Chroman, MD   Patient coming from: Home  I have personally briefly reviewed patient's old medical records in [place]  Chief Complaint: Difficulty swallowing  HPI: Brian Liu is a 69 y.o. male with medical history significant for MGUS/multiple myeloma, hypertension, ESRD, atrial fibrillation, pancytopenia. Patient was brought to the ED reports that patient has not been able to swallow for the past 2 days.  Patient was recently admitted 7/20 -  7/24 for dysphagia thought to be of oropharyngeal etiology.  Speech therapy evaluated patient and recommended a dysphagia 1 diet with honey thick liquids.  Recommendations was that patient would need a PEG, which patient has been considering for the near future.   Prior to that hospitalization, Patient was seen at [facility] on 02/16/2021 for expressive aphasia and left-sided weakness.  At that time he was evaluated by neurology but found not to be a TPA candidate because he just had vascular procedure getting dialysis fistula earlier that day.  Patient symptoms improved.  He ended up being admitted to [facility] facility in [place] for stroke work-up.  Stroke work-up was negative.  He had an EGD done which was unremarkable.    Also recent hospitalization patient was also found to be COVID-19 positive, he was asymptomatic.   Patient reports that his last meal was 2 days ago.  His difficulty swallowing has progressed since he was discharged.  Patient tells me he has had episodes of choking while swallowing.  He feels food has been stuck at the back of his throat which he regurgitates..  He has a mild intermittent cough.  No difficulty breathing.  No fevers no chills.  No vomiting.  Patient son reported a 20 pound weight loss since onset of dysphagia.  ED Course: At the time of my evaluation systolic was down to the 11H.  Potassium 3.1.  BUN 21.  Blood  glucose 64.  EKG shows atrial fibrillation rate 88.  500 mill bolus given.  D50 given.  EDP talked to general surgeon, Dr. Arnoldo Morale, patient to be seen in the morning.  Review of Systems: As per HPI all other systems reviewed and negative.  Past Medical History:  Diagnosis Date  . Atrial fibrillation (HCC)    Permanent, Not on Coumadin because of history of lower GI bleed  . Diastolic dysfunction    Restrictive  diastolic filling pattern echo,  February, 2013  . Dyslipidemia   . Ejection fraction    EF 60-65%, echo, 2013  . ESRD (end stage renal disease) ([place])    Dialysis, fistula in left arm, M-W-F  . Glaucoma   . Hypertension   . Lower gastrointestinal bleed   . LVH (left ventricular hypertrophy)   . 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 Mitral regurgitation    Mild by echo, 2013  . Multiple myeloma ([place]) 04/30/2016  . Pancytopenia ([place])    Dr Ignacia Bayley  . Pulmonary hypertension (HCC)    Severe, PA pressure 65-70 mm of mercury, echo, 2013  . Tobacco abuse     Past Surgical History:  Procedure Laterality Date  . A/V FISTULAGRAM Left 04/03/2017   Procedure: A/V Fistulagram;  Surgeon: Conrad Piqua, MD;  Location: [place] [facility];  Service: Cardiovascular;  Laterality: Left;  . A/V FISTULAGRAM Left 08/06/2018   Procedure: A/V FISTULAGRAM;  Surgeon: [name], MD;  Location: [place] [facility];  Service: Cardiovascular;  Laterality: Left;  . AV FISTULA PLACEMENT Left 1992   Left arm   . AV FISTULA PLACEMENT Right 02/17/2020   Procedure: INSERTION OF RIGHT ARM  ARTERIOVENOUS (AV)  GRAFT;  Surgeon: [name], MD;  Location: [place];  Service: Vascular;  Laterality: Right;  . IR FLUORO GUIDE CV LINE RIGHT  06/23/2018  . IR FLUORO GUIDE CV LINE RIGHT  12/28/2019  . IR FLUORO GUIDE CV LINE RIGHT  01/20/2020  . IR PTA VENOUS EXCEPT DIALYSIS CIRCUIT  01/20/2020  . IR REMOVAL TUN CV CATH W/O FL  08/20/2018  . IR US GUIDE VASC ACCESS RIGHT   06/23/2018  . IR US GUIDE VASC ACCESS RIGHT  12/28/2019  . IR VENOCAVAGRAM SVC  01/20/2020  . 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 PERIPHERAL VASCULAR BALLOON ANGIOPLASTY Left 08/06/2018   Procedure: PERIPHERAL VASCULAR BALLOON ANGIOPLASTY;  Surgeon: [name], MD;  Location: [place] [facility];  Service: Cardiovascular;  Laterality: Left;     reports that he has never smoked. He has never used smokeless tobacco. He reports that he does not drink alcohol and does not use drugs.  Allergies  Allergen Reactions  . Hydrocodone Itching    Cough syrup    Family History  Problem Relation Age of Onset  . Other Mother        Deceased, 101  . Stomach cancer Father        Deceased, 60  . Kidney failure Son   . Healthy Son     Prior to Admission medications   Medication Sig Start Date End Date Taking? Authorizing Provider  cinacalcet (SENSIPAR) 30 MG tablet Take 30 mg by mouth at bedtime.    Yes [provider]  dorzolamide-timolol (COSOPT) 22.3-6.8 MG/ML ophthalmic solution Place 1 drop into both eyes 2 (two) times daily.    Yes [provider]  ferric citrate (AURYXIA) 1 GM 210 MG(Fe) tablet Take 210 mg by mouth 3 (three) times daily with meals.   Yes [provider]  gabapentin (NEURONTIN) 100 MG capsule Take 100 mg by mouth 2 (two) times daily.  06/14/18  Yes [provider]  hydrOXYzine (ATARAX/VISTARIL) 25 MG tablet Take 25 mg by mouth daily as needed for anxiety or itching.    Yes [provider]  Latanoprostene Bunod (VYZULTA) 0.024 % SOLN Place 1 drop into both eyes at bedtime.   Yes [provider]  megestrol (MEGACE) 20 MG tablet Take 20 mg  by mouth daily.   Yes [provider]  midodrine (PROAMATINE) 10 MG tablet Take 15 mg by mouth in the morning and at bedtime.  04/10/17  Yes [provider]  mirtazapine (REMERON) 7.5 MG tablet Take 7.5 mg by mouth at bedtime. 04/01/17  Yes [provider]  simvastatin (ZOCOR) 20 MG tablet Take 20 mg by mouth daily.   Yes [provider]  bimatoprost (LUMIGAN) 0.03 % ophthalmic solution Place 1 drop into both eyes at bedtime. Patient not taking: Reported on 03/15/2020    [provider]  folic acid (FOLVITE) 1 MG tablet Take 1 mg by mouth daily. Patient not taking: Reported on 03/15/2020    [provider]  folic acid-vitamin b complex-vitamin c-selenium-zinc (DIALYVITE) 3 MG TABS tablet Take 1 tablet by mouth daily. Patient not taking: Reported on 03/15/2020    [provider]  lanthanum (FOSRENOL) 1000 MG chewable tablet Chew 500-1,000 mg by mouth See admin instructions. Chew 1056m twice daily with meals and 5035mwith snacks Patient not taking: Reported on 03/15/2020    [provider]  levothyroxine (SYNTHROID) 137 MCG tablet Take 137 mcg by mouth daily before breakfast. Patient not taking: Reported on 03/15/2020    [provider]  oxyCODONE-acetaminophen (PERCOCET) 5-325 MG tablet Take 1 tablet by mouth every 4 (four) hours as needed for severe pain. Patient not taking: Reported on 03/15/2020 02/17/20 02/16/21  BaKaroline CaldwellPA-C    Physical Exam: Vitals:   03/15/20 1251 03/15/20 1256 03/15/20 1920 03/15/20 2030  BP:  108/86  (!) 132/50  Pulse:  (!) 101 81 83  Resp:  '20 11 17  ' Temp:  (!) 97.5 F (36.4 C)    TempSrc:  Tympanic    SpO2:   100% 100%  Weight: 54.4 kg     Height: '6\' 3"'  (1.905 m)       Constitutional: Chronically ill-appearing, calm, comfortable Vitals:   03/15/20 1251 03/15/20 1256 03/15/20 1920 03/15/20 2030  BP:  108/86  (!) 132/50  Pulse:  (!) 101 81 83  Resp:  '20 11 17  ' Temp:  (!)  97.5 F (36.4 C)    TempSrc:  Tympanic    SpO2:   100% 100%  Weight: 54.4 kg     Height: '6\' 3"'  (1.905 m)      Eyes: PERRL, lids and conjunctivae normal ENMT: Mucous membranes are dry Neck: normal, supple, no masses, no thyromegaly Respiratory: clear to auscultation bilaterally, no wheezing, no crackles. Normal respiratory effort. No accessory muscle use.  Cardiovascular: Regular rate and rhythm, no murmurs / rubs / gallops. No extremity edema. 2+ pedal pulses.  Abdomen: no tenderness, no masses palpated. No hepatosplenomegaly. Bowel sounds positive.  Musculoskeletal: no clubbing / cyanosis. No joint deformity upper and lower extremities. Good ROM, no contractures. Normal muscle tone.  Skin: no rashes, lesions, ulcers. No induration Neurologic: No apparent cranial nerve abnormality, likely reduced grip strength on left upper extremity-reports this is unchanged, 4/5 strength bilateral lower extremities.  Psychiatric: Normal judgment and insight. Alert and oriented x 3. Normal mood.   Labs on Admission: I have personally reviewed following labs and imaging studies  CBC: Recent Labs  Lab 03/15/20 1700  WBC 3.2*  HGB 8.0*  HCT 26.8*  MCV 91.8  PLT 14846  Basic Metabolic Panel: Recent Labs  Lab 03/15/20 1700  NA 137  K 3.1*  CL 99  CO2 23  GLUCOSE 64*  BUN 21  CREATININE 5.90*  CALCIUM 8.8*   Liver Function Tests: Recent Labs  Lab 03/15/20 1700  AST 13*  ALT 7  ALKPHOS 42  BILITOT 1.2  PROT 6.5  ALBUMIN 3.0*    Radiological Exams on Admission: DG Chest Portable 1 View  Result Date: 03/15/2020 CLINICAL DATA:  Difficulty swallowing for 2 days. The patient tested positive for COVID-19 02/22/2020. EXAM: PORTABLE CHEST 1 VIEW COMPARISON:  Single-view of the chest 02/23/2020. PA and lateral chest 12/05/2016. FINDINGS: The lungs are clear. There is cardiomegaly and aortic atherosclerosis. A few calcified pleural plaques are noted. Right IJ approach dialysis catheter is in  place and the patient is status post right shoulder replacement. IMPRESSION: No acute disease. Cardiomegaly. Aortic Atherosclerosis (ICD10-I70.0). Electronically Signed  By: Inge Rise M.D.   On: 03/15/2020 14:58    EKG: Independently reviewed.  Atrial fibrillation rate 88.  QTc 490.  No significant ST or T wave changes compared to prior.  Assessment/Plan Principal Problem:   Dysphagia Active Problems:   Hypertension   Mitral regurgitation   ESRD (end stage renal disease) (HCC)   Atrial fibrillation (HCC)   Multiple myeloma (HCC)   COVID-19 virus infection   MGUS (monoclonal gammopathy of unknown significance)  Dysphagia- likely with oropharyngeal dysphagia, EGD done at [facility] was unremarkable. Options on recent discharge was to have PEG tube insertion to be considered in the near future. Patient is ready to have a PEG tube placed.  Reports regurgitation, episodes of choking.  No dyspnea.  Portable chest x-ray without acute abnormality.  He is on room air. -Remain n.p.o. - 1.5 L bolus given Cont D5N/s + 40 kcl 100cc/hr x 1 day - EDP consulted general surgeon Dr. Arnoldo Morale patient will be seen  Hypoglycemia-glucose 64.  Likely from basically no oral intake in the past 2 days. -D50 given, continue dextrose containing fluids -Monitor glucose closely, CBG q4H x 4  Recent COVID-19 infection-tested positive 02/22/2020.  Asymptomatic.  Did not not require oxygen. Patient had 2 doses of COVID-19 vaccine, as per patient.  -Covid test not repeated today  ESRD on HD-patient has dialysis Monday Wednesday Friday, unclear about his last dialysis.  - Please consult nephrology in a.m. for hemodialysis.   Atrial fibrillation-patient has permanent atrial fibrillation.  Heart rate 80s.  Not on rate limiting medication. He is not on anticoagulation due to history of GI bleed.  History of hypotension-1.5 L bolus fluids given.  Blood pressure systolic in the 624E currently. -N.p.o. for now, hold  home midodrine  Hypothyroidism -Hold home Synthroid for now   DVT prophylaxis: SCDs for now Code Status: Full code Family Communication: None at bedside Disposition Plan: ~ 2 days, ending surgical evaluation Consults called: General surgery Admission status: Inpatient, MedSurg.   Bethena Roys MD [facility]  03/15/2020, 9:35 PM

## 2020-03-15 NOTE — ED Triage Notes (Signed)
Pt not able to swallow in last two days.  Seen here in ED about two weeks ago but pt continues to having difficulty swallowing.  C/o neck and back pain, rating pain 10/10.  Has has not fallen recently.

## 2020-03-16 DIAGNOSIS — R1311 Dysphagia, oral phase: Secondary | ICD-10-CM

## 2020-03-16 DIAGNOSIS — N186 End stage renal disease: Secondary | ICD-10-CM

## 2020-03-16 LAB — GLUCOSE, CAPILLARY
Glucose-Capillary: 82 mg/dL (ref 70–99)
Glucose-Capillary: 91 mg/dL (ref 70–99)
Glucose-Capillary: 89 mg/dL (ref 70–99)
Glucose-Capillary: 96 mg/dL (ref 70–99)
Glucose-Capillary: 100 mg/dL — ABNORMAL HIGH (ref 70–99)

## 2020-03-16 LAB — BASIC METABOLIC PANEL
Anion gap: 16 — ABNORMAL HIGH (ref 5–15)
BUN: 21 mg/dL (ref 8–23)
CO2: 21 mmol/L — ABNORMAL LOW (ref 22–32)
Calcium: 9 mg/dL (ref 8.9–10.3)
Chloride: 104 mmol/L (ref 98–111)
Creatinine, Ser: 6.15 mg/dL — ABNORMAL HIGH (ref 0.61–1.24)
GFR calc Af Amer: 10 mL/min — ABNORMAL LOW (ref >60)
GFR calc non Af Amer: 9 mL/min — ABNORMAL LOW (ref >60)
Glucose, Bld: 106 mg/dL — ABNORMAL HIGH (ref 70–99)
Potassium: 3.4 mmol/L — ABNORMAL LOW (ref 3.5–5.1)
Sodium: 141 mmol/L (ref 135–145)

## 2020-03-16 LAB — IRON AND TIBC
Iron: 63 ug/dL (ref 45–182)
Saturation Ratios: 43 % — ABNORMAL HIGH (ref 17.9–39.5)
TIBC: 147 ug/dL — ABNORMAL LOW (ref 250–450)
UIBC: 84 ug/dL

## 2020-03-16 LAB — FERRITIN: Ferritin: 703 ng/mL — ABNORMAL HIGH (ref 24–336)

## 2020-03-16 MED ORDER — OSMOLITE 1.5 CAL PO LIQD: 1000.0000 mL | ORAL | Status: DC

## 2020-03-16 MED ORDER — OSMOLITE 1.2 CAL PO LIQD: 1000.0000 mL | ORAL | Status: DC

## 2020-03-16 MED ORDER — MIDODRINE HCL 5 MG PO TABS
10.0000 mg | ORAL_TABLET | Freq: Three times a day (TID) | ORAL | Status: DC
  Administered 2020-03-16 – 2020-03-21 (×14): 10 mg via ORAL
  Filled 2020-03-16 (×14): qty 2

## 2020-03-16 MED ORDER — CHLORHEXIDINE GLUCONATE CLOTH 2 % EX PADS
6.0000 | MEDICATED_PAD | Freq: Every day | CUTANEOUS | Status: DC
  Administered 2020-03-16 – 2020-03-21 (×6): 6 via TOPICAL

## 2020-03-16 NOTE — Progress Notes (Signed)
Patient Demographics:    Brian Liu, is a 69 y.o. male, DOB - 1951/07/03, AVW:[id number]  Admit date - 03/15/2020   Admitting Physician Ejiroghene Arlyce Dice, MD  Outpatient Primary MD for the patient is [name], MD  LOS - 1   Chief Complaint  Patient presents with  . Dysphagia        Subjective:    [name] today has no fevers, no emesis,  No chest pain, no new complaints, complains of being hungry however -No vomiting or diarrhea  Assessment  & Plan :    Principal Problem:   Dysphagia Active Problems:   Hypertension   Mitral regurgitation   ESRD (end stage renal disease) (HCC)   Atrial fibrillation (HCC)   Multiple myeloma (HCC)   COVID-19 virus infection   MGUS (monoclonal gammopathy of unknown significance)  Brief Summary:- [name] is a 69 y.o. male with medical history significant for MGUS/multiple myeloma, hypertension, ESRD, atrial fibrillation, pancytopenia, history of recent COVID-19 infection (02/22/20), status post prior Covid 19 vaccination history of presumed TIAs with negative MRI on 02/18/2020 admitted on 03/15/2020 with dysphagia and failure to thrive and needing PEG tube placement for nutritional purposes  A/p 1)Dysphagia and Failure to Thrive--patient had episode of hypoglycemia in the setting of poor oral intake  -Recent EGD  was unremarkable --General surgery consult for PEG tube placement on 03/17/2020 -Dietitian/nutritionist consult appreciated -Plan is for Osmolite 1.5 infusion with water flushes once PEG tube is placed -Continue IV dextrose for now  2)ESRD--- gets HD Monday Wednesdays and Fridays, missed HD session on 03/15/2020, nephrology consult for HD requested  3)Chronic Atrial fibrillation----appears rate is controlled without AV nodal blocking agents, -Not on anticoagulation due to high risk of bleeding  4)Chronic Hypotension--- continue midodrine,  very judicious with IV fluids given ESRD status  5)Hypothyroidism--- continue Synthroid  6) chronic anemia of CKD--- EPO/ESA agent per nephrology team, hemoglobin currently stable around 8  Disposition/Need for in-Hospital Stay- patient unable to be discharged at this time due to --- failure to thrive due to dysphagia resulting in hypoglycemia requiring IV dextrose infusion pending PEG tube placement  Status is: Inpatient  Remains inpatient appropriate because:failure to thrive due to dysphagia resulting in hypoglycemia requiring IV dextrose infusion pending PEG tube placement   Disposition: The patient is from: Home              Anticipated d/c is to: Home              Anticipated d/c date is: 2 days              Patient currently is not medically stable to d/c. Barriers: Not Clinically Stable- failure to thrive due to dysphagia resulting in hypoglycemia requiring IV dextrose infusion pending PEG tube placement  Code Status : full  Family Communication:  (patient is alert, awake and coherent)   Consults  :  Nephrology/Gen Surgery  DVT Prophylaxis  :   SCDs   Lab Results  Component Value Date   PLT 146 (L) 03/15/2020    Inpatient Medications  Scheduled Meds: . Chlorhexidine Gluconate Cloth  6 each Topical Q0600   Continuous Infusions: . dextrose 5 % and 0.9 % NaCl with KCl 40 mEq/L 75 mL/hr  at 03/15/20 2257  . [START ON 03/18/2020] feeding supplement (OSMOLITE 1.5 CAL)     PRN Meds:.acetaminophen **OR** acetaminophen, ondansetron **OR** ondansetron (ZOFRAN) IV    Anti-infectives (From admission, onward)   None        Objective:   Vitals:   03/15/20 2030 03/15/20 2210 03/15/20 2253 03/16/20 0407  BP: (!) 132/50 120/63 (!) 95/52 90/70  Pulse: 83 86 74 81  Resp: '17 13 20 18  ' Temp:  98 F (36.7 C) (!) 97.5 F (36.4 C) 98.2 F (36.8 C)  TempSrc:   Oral   SpO2: 100% 100%    Weight:      Height:        Wt Readings from Last 3 Encounters:  03/15/20 54.4 kg    02/25/20 61.5 kg  02/17/20 78 kg     Intake/Output Summary (Last 24 hours) at 03/16/2020 1129 Last data filed at 03/16/2020 0455 Gross per 24 hour  Intake 941.7 ml  Output --  Net 941.7 ml     Physical Exam  Gen:- Awake Alert,  In no apparent distress  HEENT:- [place].AT, No sclera icterus Neck-Supple Neck, right IJ HD catheter Lungs-  CTAB , fair symmetrical air movement CV- S1, S2 normal, regular  Abd-  +ve B.Sounds, Abd Soft, No tenderness,    Extremity/Skin:- No  edema, pedal pulses present  Psych-affect is appropriate, oriented x3 Neuro-generalized weakness, no new focal deficits, no tremors MSK-nonfunctional left forearm AV fistula   Data Review:   Micro Results No results found for this or any previous visit (from the past 240 hour(s)).  Radiology Reports DG Chest Portable 1 View  Result Date: 03/15/2020 CLINICAL DATA:  Difficulty swallowing for 2 days. The patient tested positive for COVID-19 02/22/2020. EXAM: PORTABLE CHEST 1 VIEW COMPARISON:  Single-view of the chest 02/23/2020. PA and lateral chest 12/05/2016. FINDINGS: The lungs are clear. There is cardiomegaly and aortic atherosclerosis. A few calcified pleural plaques are noted. Right IJ approach dialysis catheter is in place and the patient is status post right shoulder replacement. IMPRESSION: No acute disease. Cardiomegaly. Aortic Atherosclerosis (ICD10-I70.0). Electronically Signed   By: Inge Rise M.D.   On: 03/15/2020 14:58   DG Chest Port 1 View  Result Date: 02/23/2020 CLINICAL DATA:  Cough and inability to swallow.  COVID-19 positive. EXAM: PORTABLE CHEST 1 VIEW COMPARISON:  07/10/2018 FINDINGS: A right jugular dialysis catheter remains in place terminating over the high right atrium. The cardiac silhouette remains mildly enlarged. Aortic atherosclerosis is noted. The lungs are hyperinflated. Mildly accentuated interstitial markings bilaterally are similar to the prior study. No confluent airspace  opacity, edema, sizable pleural effusion, or pneumothorax is identified. Calcified pleural plaques are noted. Oral contrast material is partially visualized in the colon. A right shoulder arthroplasty is noted. IMPRESSION: Chronic findings without evidence of acute cardiopulmonary disease. Electronically Signed   By: Logan Bores M.D.   On: 02/23/2020 08:11     CBC Recent Labs  Lab 03/15/20 1700  WBC 3.2*  HGB 8.0*  HCT 26.8*  PLT 146*  MCV 91.8  MCH 27.4  MCHC 29.9*  RDW 15.7*    Chemistries  Recent Labs  Lab 03/15/20 1700 03/16/20 0412  NA 137 141  K 3.1* 3.4*  CL 99 104  CO2 23 21*  GLUCOSE 64* 106*  BUN 21 21  CREATININE 5.90* 6.15*  CALCIUM 8.8* 9.0  MG 1.9  --   AST 13*  --   ALT 7  --   ALKPHOS  42  --   BILITOT 1.2  --    ------------------------------------------------------------------------------------------------------------------ No results for input(s): CHOL, HDL, LDLCALC, TRIG, CHOLHDL, LDLDIRECT in the last 72 hours.  Lab Results  Component Value Date   HGBA1C 5.1 02/23/2020   ------------------------------------------------------------------------------------------------------------------ No results for input(s): TSH, T4TOTAL, T3FREE, THYROIDAB in the last 72 hours.  Invalid input(s): FREET3 ------------------------------------------------------------------------------------------------------------------ Recent Labs    03/15/20 1637  FERRITIN 703*    Coagulation profile No results for input(s): INR, PROTIME in the last 168 hours.  No results for input(s): DDIMER in the last 72 hours.  Cardiac Enzymes No results for input(s): CKMB, TROPONINI, MYOGLOBIN in the last 168 hours.  Invalid input(s): CK ------------------------------------------------------------------------------------------------------------------    Component Value Date/Time   BNP 423.0 (H) 12/27/2016 1510     Roxan Hockey M.D on 03/16/2020 at 11:29 AM  Go to  [email or web address] - for contact info  [facility] - Office  [phone]

## 2020-03-16 NOTE — TOC Initial Note (Signed)
Transition of Care [facility]) - Initial/Assessment Note    Patient Details  Name: Brian Liu MRN: [id number] Date of Birth: 10-20-1950  Transition of Care [facility]) CM/SW Contact:    Shade Flood, LCSW Phone Number: 03/16/2020, 1:47 PM  Clinical Narrative:                  Pt admitted from home. Spoke with pt's son, Marcello Moores, by phone to assess. Pt and son live together. Marcello Moores transports pt to HD and meets pt's other care needs. Per MD, pt having PEG tube placed tomorrow with anticipated dc home on Saturday. Discussed HH with Marcello Moores who states pt is active with [facility] currently. From the record, it appears this is through [place]. Message left for [place] at [place]. Provided information on tube feeding supplies and provider options. Marcello Moores did not have a preference of provider. Referred to Pam at [facility].   Will follow up tomorrow to further assist with dc planning.  Expected Discharge Plan: [place] Barriers to Discharge: Continued Medical Work up   Patient Goals and CMS Choice Patient states their goals for this hospitalization and ongoing recovery are:: return home CMS Medicare.gov Compare Post Acute Care list provided to:: Patient Represenative (must comment) Choice offered to / list presented to : Adult Children  Expected Discharge Plan and Services Expected Discharge Plan: [place] In-house Referral: Clinical Social Work   Post Acute Care Choice: Resumption of Svcs/PTA Provider Living arrangements for the past 2 months: Single Family Home                 DME Arranged: Tube feeding DME Agency: Other - Comment ([facility]) Date DME Agency Contacted: 03/16/20   Representative spoke with at DME Agency: [name]     Date [place]: 03/16/20      Prior Living Arrangements/Services Living arrangements for the past 2 months: [place] Lives with:: Adult Children Patient language and need for  interpreter reviewed:: Yes Do you feel safe going back to the place where you live?: Yes      Need for Family Participation in Patient Care: Yes (Comment) Care giver support system in place?: Yes (comment) Current home services: Other (comment) (Home SLP) Criminal Activity/Legal Involvement Pertinent to Current Situation/Hospitalization: No - Comment as needed  Activities of Daily Living Home Assistive Devices/Equipment: Walker (specify type), Cane (specify quad or straight) ADL Screening (condition at time of admission) Patient's cognitive ability adequate to safely complete daily activities?: Yes Is the patient deaf or have difficulty hearing?: No Does the patient have difficulty seeing, even when wearing glasses/contacts?: No Does the patient have difficulty concentrating, remembering, or making decisions?: No Patient able to express need for assistance with ADLs?: Yes Does the patient have difficulty dressing or bathing?: Yes Independently performs ADLs?: No Communication: Independent Dressing (OT): Needs assistance Is this a change from baseline?: Pre-admission baseline Grooming: Needs assistance Is this a change from baseline?: Pre-admission baseline Feeding: Independent Bathing: Needs assistance Is this a change from baseline?: Pre-admission baseline Toileting: Needs assistance Is this a change from baseline?: Pre-admission baseline In/Out Bed: Needs assistance Is this a change from baseline?: Pre-admission baseline Walks in Home: Needs assistance Is this a change from baseline?: Pre-admission baseline Does the patient have difficulty walking or climbing stairs?: Yes Weakness of Legs: Both Weakness of Arms/Hands: None  Permission Sought/Granted  Emotional Assessment Appearance:: Appears stated age     Orientation: : Oriented to Self, Oriented to Place, Oriented to  Time, Oriented to Situation Alcohol / Substance Use: Not Applicable Psych  Involvement: No (comment)  Admission diagnosis:  Oropharyngeal dysphagia [R13.12] Hypoglycemia [E16.2] Dysphagia [R13.10] Malnutrition, unspecified type ([place]) [E46] Patient Active Problem List   Diagnosis Date Noted   COVID-19 virus infection 02/24/2020   Dysphagia 02/23/2020   Primary osteoarthritis, left shoulder 06/18/2018   Venous stenosis of left upper extremity 04/03/2017   Elevated troponin    Dehydration 12/24/2016   ESRD on hemodialysis ([place]) 12/24/2016   Nausea vomiting and diarrhea 12/24/2016   Enteritis 12/04/2016   Thrush 12/04/2016   Pancreatitis 12/02/2016   Hypokalemia 12/02/2016   Anemia 12/02/2016   Abdominal pain    Multiple myeloma ([place]) 04/30/2016   MGUS (monoclonal gammopathy of unknown significance) 29/24/4628   Alcoholic pancreatitis 63/81/7711   Complication of dialysis access insertion 05/17/2014   ESRD (end stage renal disease) ([place])    Atrial fibrillation (HCC)    Hypertension    Pulmonary hypertension (HCC)    Mitral regurgitation    Tobacco abuse    Dyslipidemia    Lower gastrointestinal bleed    Pancytopenia (HCC)    Ejection fraction    Diastolic dysfunction    LVH (left ventricular hypertrophy)    PCP:  [name], MD Pharmacy:   [facility] (ESRD Bundle Only) - [place], [address] [place] [address] Phone: [phone] Fax: [facility] [address], [place] [address] Phone: [phone] Fax: [phone]     Social Determinants of Health (SDOH) Interventions    Readmission Risk Interventions Readmission Risk Prevention Plan 03/16/2020  Transportation Screening Complete  Home Care Screening Complete  Medication Review (RN CM) Complete  Some recent data might be hidden

## 2020-03-16 NOTE — Consult Note (Signed)
ESRD Consult Note  Requesting provider: Irwin Brakeman Service requesting consult: Hospitalist Reason for consult: ESRD, provision of dialysis Indication for acute dialysis?: End Stage Renal Disease  Outpatient dialysis unit: [facility] Outpatient dialysis schedule: MWF  Assessment/Recommendations: Brian Liu is a/an 69 y.o. male with a past medical history notable for ESRD on HD admitted with dysphagia and PEG tube placement  # ESRD: Continue outpt prescription and schedule: 3.25hrs, 2K, 2.5Ca, Na 138 (will use 137), Bicarbonate 37 (will use 35), 35.5C, Dialyzer: F180 in place of gambro revaclear 300, holding heparin given likely procedure  # Volume/ hypotension: EDW 82kg. Also likely losing weight. Will run even for now  # Anemia of Chronic Kidney Disease: Hemoglobin 8. Currently receiving no ESAs due to myeloma. Will check iron panel and ferritin  # Secondary Hyperparathyroidism/Hyperphosphatemia: On hecterol 0.12mg MWF and sensipar 343mnightly. Holding [place] not eating.  Restart Sensipar 30 mg nightly once PEG tube placed  # Vascular access: Recently placed AVF. Use right tunneled CVC  # Hospital problem: Dysphagia: PEG tube placement per primary team  # Additional recommendations: - Dose all meds for creatinine clearance < 10 ml/min  - Unless absolutely necessary, no MRIs with gadolinium.  - Implement save arm precautions.  Prefer needle sticks in the dorsum of the hands or wrists.  No blood pressure measurements in arm. - If blood transfusion is requested during hemodialysis sessions, please alert [place] to the session.   Recommendations were discussed with the primary team.   History of Present Illness: Brian SCHNEPFs a/an [age].o. male with a past medical history of ESRD who presents with dysphagia and FTT.  Patient was recently hospitalized for Covid pneumonia and hypotension.  Patient was discharged on 7/24.  He was complaining of dysphagia at that  time.  Speech therapy saw him and recommended adjustment in his diet as well as consideration of PEG tube but the patient wanted to to delay this.  Resented yesterday stating that his last meal was 2 days before.  He had had worsening problems with swallowing and was choking on his food.  His food gets stuck in the back of his throat and he would cough it back up.  He has not had fevers, chills, shortness of breath, chest pain.  It was noted by his son that he has lost 20 pounds.  His labs on admission demonstrated potassium of 3.1.  He was given some fluids due to concern for dehydration.  He was admitted for likely PEG tube placement. Patient missed dialysis yesterday.    Medications:  Current Facility-Administered Medications  Medication Dose Route Frequency Provider Last Rate Last Admin   acetaminophen (TYLENOL) tablet 650 mg  650 mg Oral Q6H PRN Emokpae, Ejiroghene E, MD       Or   acetaminophen (TYLENOL) suppository 650 mg  650 mg Rectal Q6H PRN Emokpae, Ejiroghene E, MD       dextrose 5 % and 0.9 % NaCl with KCl 40 mEq/L infusion   Intravenous Continuous Emokpae, Ejiroghene E, MD 75 mL/hr at 03/15/20 2257 New Bag at 03/15/20 2257   [START ON 03/18/2020] feeding supplement (OSMOLITE 1.5 CAL) liquid 1,000 mL  1,000 mL Per Tube Continuous Emokpae, Courage, MD       ondansetron (ZOFRAN) tablet 4 mg  4 mg Oral Q6H PRN Emokpae, Ejiroghene E, MD       Or   ondansetron (ZOFRAN) injection 4 mg  4 mg Intravenous Q6H PRN Emokpae, Ejiroghene E,  MD         ALLERGIES Hydrocodone  MEDICAL HISTORY Past Medical History:  Diagnosis Date   Atrial fibrillation ([place])    Permanent, Not on Coumadin because of history of lower GI bleed   Diastolic dysfunction    Restrictive  diastolic filling pattern echo,  February, 2013   Dyslipidemia    Ejection fraction    EF 60-65%, echo, 2013   ESRD (end stage renal disease) ([place])    Dialysis, fistula in left arm, M-W-F   Glaucoma    Hypertension     Lower gastrointestinal bleed    LVH (left ventricular hypertrophy)    MGUS (monoclonal gammopathy of unknown significance) 04/30/2016   Mitral regurgitation    Mild by echo, 2013   Multiple myeloma ([place]) 04/30/2016   Pancytopenia ([place])    Dr Ignacia Bayley   Pulmonary hypertension ([place])    Severe, PA pressure 65-70 mm of mercury, echo, 2013   Tobacco abuse      SOCIAL HISTORY Social History   Socioeconomic History   Marital status: Widowed    Spouse name: Not on file   Number of children: Not on file   Years of education: Not on file   Highest education level: Not on file  Occupational History   Not on file  Tobacco Use   Smoking status: Never Smoker   Smokeless tobacco: Never Used  Vaping Use   Vaping Use: Never used  Substance and Sexual Activity   Alcohol use: No    Alcohol/week: 0.0 standard drinks   Drug use: No   Sexual activity: Not on file  Other Topics Concern   Not on file  Social History Narrative   Lives alone in a one story home.  Has 2 sons.     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     Education high school.    Social Determinants of Health   Financial Resource Strain:    Difficulty of Paying Living Expenses:   Food Insecurity:    Worried About Charity fundraiser in the Last Year:    Arboriculturist in the Last Year:   Transportation Needs:    Film/video editor (Medical):    Lack of Transportation (Non-Medical):   Physical Activity:    Days of Exercise per Week:    Minutes of Exercise per Session:   Stress:    Feeling of Stress :   Social Connections:    Frequency of Communication with Friends and Family:    Frequency of Social Gatherings with Friends and Family:    Attends Religious Services:    Active Member of Clubs or Organizations:    Attends Music therapist:    Marital Status:   Intimate Partner Violence:    Fear of Current or Ex-Partner:     Emotionally Abused:    Physically Abused:    Sexually Abused:      FAMILY HISTORY Family History  Problem Relation Age of Onset   Other Mother        Deceased, 24   Stomach cancer Father        Deceased, 30   Kidney failure Son    Healthy Son      Review of Systems: 12 systems were reviewed and negative except per HPI  Physical Exam: Vitals:   03/15/20 2253 03/16/20 0407  BP: (!) 95/52 90/70  Pulse: 74 81  Resp: 20 18  Temp: (!) 97.5  F (36.4 C) 98.2 F (36.8 C)  SpO2:     No intake/output data recorded.  Intake/Output Summary (Last 24 hours) at 03/16/2020 1007 Last data filed at 03/16/2020 0455 Gross per 24 hour  Intake 941.7 ml  Output --  Net 941.7 ml   General: Chronically ill-appearing, no distress HEENT: anicteric sclera, dry mucous membranes CV: normal rate, no edema, Right tunneled CVC in place Lungs: bilateral chest rise, normal wob Abd: soft, non-tender, non-distended Skin: no visible lesions or rashes Psych: alert, engaged, appropriate mood and affect Neuro: normal speech, no gross focal deficits   Test Results Reviewed Lab Results  Component Value Date   NA 141 03/16/2020   K 3.4 (L) 03/16/2020   CL 104 03/16/2020   CO2 21 (L) 03/16/2020   BUN 21 03/16/2020   CREATININE 6.15 (H) 03/16/2020   CALCIUM 9.0 03/16/2020   ALBUMIN 3.0 (L) 03/15/2020   PHOS 2.5 02/26/2020    I have reviewed relevant outside healthcare records

## 2020-03-16 NOTE — Progress Notes (Signed)
Pt alert, verbal, oriented. Talking, cooperative, able to make needs know. Tech reported low BP in right lower leg d/t restricted extrem's in bilateral arms. Dr. Joesph Fillers notified, gave order for midodrine TID. Per Dr. Joesph Fillers, check bp in left upper arm d/t HD no longer in use and put in >20 yrs ago. BP WNL - see flowsheets. Will cont to monitor.

## 2020-03-16 NOTE — Consult Note (Signed)
Reason for Consult: Need for PEG placement, dysphagia Referring Physician: Dr. Durward Mallard is an 69 y.o. male.  HPI: Patient is a 69 year old black male with multiple medical problems including end-stage renal disease, atrial fibrillation, and progressive oral phase dysphagia who has had been recommended gastrostomy tube placement in the past. He wanted to think about it and now presents for gastrostomy tube placement. He missed his dialysis yesterday.  Past Medical History:  Diagnosis Date  . Atrial fibrillation (HCC)    Permanent, Not on Coumadin because of history of lower GI bleed  . Diastolic dysfunction    Restrictive  diastolic filling pattern echo,  February, 2013  . Dyslipidemia   . Ejection fraction    EF 60-65%, echo, 2013  . ESRD (end stage renal disease) ([place])    Dialysis, fistula in left arm, M-W-F  . Glaucoma   . Hypertension   . Lower gastrointestinal bleed   . LVH (left ventricular hypertrophy)   . 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 Mitral regurgitation    Mild by echo, 2013  . Multiple myeloma ([place]) 04/30/2016  . Pancytopenia ([place])    Dr Ignacia Bayley  . Pulmonary hypertension (HCC)    Severe, PA pressure 65-70 mm of mercury, echo, 2013  . Tobacco abuse     Past Surgical History:  Procedure Laterality Date  . A/V FISTULAGRAM Left 04/03/2017   Procedure: A/V Fistulagram;  Surgeon: Conrad [place], MD;  Location: [facility];  Service: Cardiovascular;  Laterality: Left;  . A/V FISTULAGRAM Left 08/06/2018   Procedure: A/V FISTULAGRAM;  Surgeon: [name], MD;  Location: [facility];  Service: Cardiovascular;  Laterality: Left;  . AV FISTULA PLACEMENT Left 1992   Left arm   . AV FISTULA PLACEMENT Right 02/17/2020   Procedure: INSERTION OF RIGHT ARM  ARTERIOVENOUS (AV)  GRAFT;  Surgeon: [name], MD;  Location: [facility];  Service: Vascular;  Laterality: Right;  . IR FLUORO GUIDE CV LINE RIGHT   06/23/2018  . IR FLUORO GUIDE CV LINE RIGHT  12/28/2019  . IR FLUORO GUIDE CV LINE RIGHT  01/20/2020  . IR PTA VENOUS EXCEPT DIALYSIS CIRCUIT  01/20/2020  . IR REMOVAL TUN CV CATH W/O FL  08/20/2018  . IR US GUIDE VASC ACCESS RIGHT  06/23/2018  . IR US GUIDE VASC ACCESS RIGHT  12/28/2019  . IR VENOCAVAGRAM SVC  01/20/2020  . 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 PERIPHERAL VASCULAR BALLOON ANGIOPLASTY Left 08/06/2018   Procedure: PERIPHERAL VASCULAR BALLOON ANGIOPLASTY;  Surgeon: [name], MD;  Location: [facility];  Service: Cardiovascular;  Laterality: Left;    Family History  Problem Relation Age of Onset  . Other Mother        Deceased, 40  . Stomach cancer Father        Deceased, 75  . Kidney failure Son   . Healthy Son     Social History:  reports that he has never smoked. He has never used smokeless tobacco. He reports that he does not drink alcohol and does not use drugs.  Allergies:  Allergies  Allergen Reactions  . Hydrocodone Itching  Cough syrup    Medications: I have reviewed the patient's current medications.  Results for orders placed or performed during the hospital encounter of 03/15/20 (from the past 48 hour(s))  CBC     Status: Abnormal   Collection Time: 03/15/20  5:00 PM  Result Value Ref Range   WBC 3.2 (L) 4.0 - 10.5 K/uL   RBC 2.92 (L) 4.22 - 5.81 MIL/uL   Hemoglobin 8.0 (L) 13.0 - 17.0 g/dL   HCT 26.8 (L) 39 - 52 %   MCV 91.8 80.0 - 100.0 fL   MCH 27.4 26.0 - 34.0 pg   MCHC 29.9 (L) 30.0 - 36.0 g/dL   RDW 15.7 (H) 11.5 - 15.5 %   Platelets 146 (L) 150 - 400 K/uL   nRBC 0.0 0.0 - 0.2 %    Comment: Performed at [facility], [address],  [place], [place] [address]  Comprehensive metabolic panel     Status: Abnormal   Collection Time: 03/15/20  5:00 PM  Result Value Ref Range   Sodium 137 135 - 145 mmol/L   Potassium 3.1 (L) 3.5 - 5.1 mmol/L   Chloride 99 98 - 111 mmol/L   CO2 23 22 - 32 mmol/L   Glucose, Bld 64 (L) 70 - 99 mg/dL    Comment: Glucose reference range applies only to samples taken after fasting for at least 8 hours.   BUN 21 8 - 23 mg/dL   Creatinine, Ser 5.90 (H) 0.61 - 1.24 mg/dL   Calcium 8.8 (L) 8.9 - 10.3 mg/dL   Total Protein 6.5 6.5 - 8.1 g/dL   Albumin 3.0 (L) 3.5 - 5.0 g/dL   AST 13 (L) 15 - 41 U/L   ALT 7 0 - 44 U/L   Alkaline Phosphatase 42 38 - 126 U/L   Total Bilirubin 1.2 0.3 - 1.2 mg/dL   GFR calc non Af Amer 9 (L) >60 mL/min   GFR calc Af Amer 10 (L) >60 mL/min   Anion gap 15 5 - 15    Comment: Performed at [facility], [address] [place], [place] [address]  Magnesium     Status: None   Collection Time: 03/15/20  5:00 PM  Result Value Ref Range   Magnesium 1.9 1.7 - 2.4 mg/dL    Comment: Performed at [facility], [address], [place], [place] [address]  CBG monitoring, ED     Status: None   Collection Time: 03/15/20 10:08 PM  Result Value Ref Range   Glucose-Capillary 82 70 - 99 mg/dL    Comment: Glucose reference range applies only to samples taken after fasting for at least 8 hours.  Glucose, capillary     Status: None   Collection Time: 03/16/20  1:03 AM  Result Value Ref Range   Glucose-Capillary 82 70 - 99 mg/dL    Comment: Glucose reference range applies only to samples taken after fasting for at least 8 hours.  Glucose, capillary     Status: None   Collection Time: 03/16/20  4:03 AM  Result Value Ref Range   Glucose-Capillary 91 70 - 99 mg/dL    Comment: Glucose reference range applies only to samples taken after fasting for at least 8 hours.  Basic metabolic panel     Status: Abnormal   Collection Time: 03/16/20  4:12 AM  Result Value Ref Range   Sodium 141 135 -  145 mmol/L   Potassium 3.4 (L) 3.5 - 5.1 mmol/L   Chloride 104  98 - 111 mmol/L   CO2 21 (L) 22 - 32 mmol/L   Glucose, Bld 106 (H) 70 - 99 mg/dL    Comment: Glucose reference range applies only to samples taken after fasting for at least 8 hours.   BUN 21 8 - 23 mg/dL   Creatinine, Ser 6.15 (H) 0.61 - 1.24 mg/dL   Calcium 9.0 8.9 - 10.3 mg/dL   GFR calc non Af Amer 9 (L) >60 mL/min   GFR calc Af Amer 10 (L) >60 mL/min   Anion gap 16 (H) 5 - 15    Comment: Performed at [facility], [address] [place], [place] [address]    DG Chest Portable 1 View  Result Date: 03/15/2020 CLINICAL DATA:  Difficulty swallowing for 2 days. The patient tested positive for COVID-19 02/22/2020. EXAM: PORTABLE CHEST 1 VIEW COMPARISON:  Single-view of the chest 02/23/2020. PA and lateral chest 12/05/2016. FINDINGS: The lungs are clear. There is cardiomegaly and aortic atherosclerosis. A few calcified pleural plaques are noted. Right IJ approach dialysis catheter is in place and the patient is status post right shoulder replacement. IMPRESSION: No acute disease. Cardiomegaly. Aortic Atherosclerosis (ICD10-I70.0). Electronically Signed   By: Inge Rise M.D.   On: 03/15/2020 14:58    ROS:  Pertinent items are noted in HPI.  Blood pressure 90/70, pulse 81, temperature 98.2 F (36.8 C), resp. rate 18, height '6\' 3"'  (1.905 m), weight 54.4 kg, SpO2 100 %. Physical Exam: Pleasant black male no acute distress Head is normocephalic, atraumatic Lungs clear to auscultation with equal breath sounds bilaterally Heart examination reveals an irregularly irregular rate and rhythm Abdomen is soft, nontender, nondistended, flat. Previous notes reviewed, H&P reviewed Assessment/Plan: Impression: Dysphagia, decreased oral intake Plan: Will place PEG tomorrow. The risks and benefits of the procedure including bleeding, infection, and bowel injury were fully explained to the patient, who gave informed consent.  Aviva Signs 03/16/2020, 7:56 AM

## 2020-03-16 NOTE — H&P (View-Only) (Signed)
Reason for Consult: Need for PEG placement, dysphagia Referring Physician: Dr. Durward Mallard is an 69 y.o. male.  HPI: Patient is a 69 year old black male with multiple medical problems including end-stage renal disease, atrial fibrillation, and progressive oral phase dysphagia who has had been recommended gastrostomy tube placement in the past. He wanted to think about it and now presents for gastrostomy tube placement. He missed his dialysis yesterday.  Past Medical History:  Diagnosis Date  . Atrial fibrillation (HCC)    Permanent, Not on Coumadin because of history of lower GI bleed  . Diastolic dysfunction    Restrictive  diastolic filling pattern echo,  February, 2013  . Dyslipidemia   . Ejection fraction    EF 60-65%, echo, 2013  . ESRD (end stage renal disease) ([place])    Dialysis, fistula in left arm, M-W-F  . Glaucoma   . Hypertension   . Lower gastrointestinal bleed   . LVH (left ventricular hypertrophy)   . 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 Mitral regurgitation    Mild by echo, 2013  . Multiple myeloma ([place]) 04/30/2016  . Pancytopenia ([place])    Dr Ignacia Bayley  . Pulmonary hypertension (HCC)    Severe, PA pressure 65-70 mm of mercury, echo, 2013  . Tobacco abuse     Past Surgical History:  Procedure Laterality Date  . A/V FISTULAGRAM Left 04/03/2017   Procedure: A/V Fistulagram;  Surgeon: Conrad Whitesboro, MD;  Location: [facility];  Service: Cardiovascular;  Laterality: Left;  . A/V FISTULAGRAM Left 08/06/2018   Procedure: A/V FISTULAGRAM;  Surgeon: [name], MD;  Location: [facility];  Service: Cardiovascular;  Laterality: Left;  . AV FISTULA PLACEMENT Left 1992   Left arm   . AV FISTULA PLACEMENT Right 02/17/2020   Procedure: INSERTION OF RIGHT ARM  ARTERIOVENOUS (AV)  GRAFT;  Surgeon: [name], MD;  Location: [facility];  Service: Vascular;  Laterality: Right;  . IR FLUORO GUIDE CV LINE RIGHT   06/23/2018  . IR FLUORO GUIDE CV LINE RIGHT  12/28/2019  . IR FLUORO GUIDE CV LINE RIGHT  01/20/2020  . IR PTA VENOUS EXCEPT DIALYSIS CIRCUIT  01/20/2020  . IR REMOVAL TUN CV CATH W/O FL  08/20/2018  . IR US GUIDE VASC ACCESS RIGHT  06/23/2018  . IR US GUIDE VASC ACCESS RIGHT  12/28/2019  . IR VENOCAVAGRAM SVC  01/20/2020  . 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 PERIPHERAL VASCULAR BALLOON ANGIOPLASTY Left 08/06/2018   Procedure: PERIPHERAL VASCULAR BALLOON ANGIOPLASTY;  Surgeon: [name], MD;  Location: [facility];  Service: Cardiovascular;  Laterality: Left;    Family History  Problem Relation Age of Onset  . Other Mother        Deceased, 70  . Stomach cancer Father        Deceased, 35  . Kidney failure Son   . Healthy Son     Social History:  reports that he has never smoked. He has never used smokeless tobacco. He reports that he does not drink alcohol and does not use drugs.  Allergies:  Allergies  Allergen Reactions  . Hydrocodone Itching  Cough syrup    Medications: I have reviewed the patient's current medications.  Results for orders placed or performed during the hospital encounter of 03/15/20 (from the past 48 hour(s))  CBC     Status: Abnormal   Collection Time: 03/15/20  5:00 PM  Result Value Ref Range   WBC 3.2 (L) 4.0 - 10.5 K/uL   RBC 2.92 (L) 4.22 - 5.81 MIL/uL   Hemoglobin 8.0 (L) 13.0 - 17.0 g/dL   HCT 26.8 (L) 39 - 52 %   MCV 91.8 80.0 - 100.0 fL   MCH 27.4 26.0 - 34.0 pg   MCHC 29.9 (L) 30.0 - 36.0 g/dL   RDW 15.7 (H) 11.5 - 15.5 %   Platelets 146 (L) 150 - 400 K/uL   nRBC 0.0 0.0 - 0.2 %    Comment: Performed at [facility], [address]  [place], [place] [address]  Comprehensive metabolic panel     Status: Abnormal   Collection Time: 03/15/20  5:00 PM  Result Value Ref Range   Sodium 137 135 - 145 mmol/L   Potassium 3.1 (L) 3.5 - 5.1 mmol/L   Chloride 99 98 - 111 mmol/L   CO2 23 22 - 32 mmol/L   Glucose, Bld 64 (L) 70 - 99 mg/dL    Comment: Glucose reference range applies only to samples taken after fasting for at least 8 hours.   BUN 21 8 - 23 mg/dL   Creatinine, Ser 5.90 (H) 0.61 - 1.24 mg/dL   Calcium 8.8 (L) 8.9 - 10.3 mg/dL   Total Protein 6.5 6.5 - 8.1 g/dL   Albumin 3.0 (L) 3.5 - 5.0 g/dL   AST 13 (L) 15 - 41 U/L   ALT 7 0 - 44 U/L   Alkaline Phosphatase 42 38 - 126 U/L   Total Bilirubin 1.2 0.3 - 1.2 mg/dL   GFR calc non Af Amer 9 (L) >60 mL/min   GFR calc Af Amer 10 (L) >60 mL/min   Anion gap 15 5 - 15    Comment: Performed at [facility], [address], [place], [place] [address]  Magnesium     Status: None   Collection Time: 03/15/20  5:00 PM  Result Value Ref Range   Magnesium 1.9 1.7 - 2.4 mg/dL    Comment: Performed at [facility], [address] [place],  [address]  CBG monitoring, ED     Status: None   Collection Time: 03/15/20 10:08 PM  Result Value Ref Range   Glucose-Capillary 82 70 - 99 mg/dL    Comment: Glucose reference range applies only to samples taken after fasting for at least 8 hours.  Glucose, capillary     Status: None   Collection Time: 03/16/20  1:03 AM  Result Value Ref Range   Glucose-Capillary 82 70 - 99 mg/dL    Comment: Glucose reference range applies only to samples taken after fasting for at least 8 hours.  Glucose, capillary     Status: None   Collection Time: 03/16/20  4:03 AM  Result Value Ref Range   Glucose-Capillary 91 70 - 99 mg/dL    Comment: Glucose reference range applies only to samples taken after fasting for at least 8 hours.  Basic metabolic panel     Status: Abnormal   Collection Time: 03/16/20  4:12 AM  Result Value Ref Range   Sodium 141 135 -  145 mmol/L   Potassium 3.4 (L) 3.5 - 5.1 mmol/L   Chloride 104  98 - 111 mmol/L   CO2 21 (L) 22 - 32 mmol/L   Glucose, Bld 106 (H) 70 - 99 mg/dL    Comment: Glucose reference range applies only to samples taken after fasting for at least 8 hours.   BUN 21 8 - 23 mg/dL   Creatinine, Ser 6.15 (H) 0.61 - 1.24 mg/dL   Calcium 9.0 8.9 - 10.3 mg/dL   GFR calc non Af Amer 9 (L) >60 mL/min   GFR calc Af Amer 10 (L) >60 mL/min   Anion gap 16 (H) 5 - 15    Comment: Performed at [facility], [address] [place], [place] [address]    DG Chest Portable 1 View  Result Date: 03/15/2020 CLINICAL DATA:  Difficulty swallowing for 2 days. The patient tested positive for COVID-19 02/22/2020. EXAM: PORTABLE CHEST 1 VIEW COMPARISON:  Single-view of the chest 02/23/2020. PA and lateral chest 12/05/2016. FINDINGS: The lungs are clear. There is cardiomegaly and aortic atherosclerosis. A few calcified pleural plaques are noted. Right IJ approach dialysis catheter is in place and the patient is status post right shoulder replacement. IMPRESSION: No acute disease. Cardiomegaly. Aortic Atherosclerosis (ICD10-I70.0). Electronically Signed   By: Inge Rise M.D.   On: 03/15/2020 14:58    ROS:  Pertinent items are noted in HPI.  Blood pressure 90/70, pulse 81, temperature 98.2 F (36.8 C), resp. rate 18, height '6\' 3"'  (1.905 m), weight 54.4 kg, SpO2 100 %. Physical Exam: Pleasant black male no acute distress Head is normocephalic, atraumatic Lungs clear to auscultation with equal breath sounds bilaterally Heart examination reveals an irregularly irregular rate and rhythm Abdomen is soft, nontender, nondistended, flat. Previous notes reviewed, H&P reviewed Assessment/Plan: Impression: Dysphagia, decreased oral intake Plan: Will place PEG tomorrow. The risks and benefits of the procedure including bleeding, infection, and bowel injury were fully explained to the patient, who gave informed consent.  Aviva Signs 03/16/2020, 7:56 AM

## 2020-03-16 NOTE — Progress Notes (Signed)
Initial Nutrition Assessment  RD working remotely.  DOCUMENTATION CODES:   Underweight  INTERVENTION:   -Once PEG is placed and cleared to use:   Initiate Osmolite 1.5 @ 25 ml/hr via PEG and increase by 10 ml every 8 hours to goal rate of 65 ml/hr.    Tube feeding regimen provides 2340 kcal (100% of needs), 98 grams of protein, and 1189 ml of H2O.   -Recommend monitor Mg, K, and Phos daily x 3 days and replete as needed due to high refeeding risk   -If bolus feedings are desired at discharge, recommend:   355 ml (1.5 cans) Osmolite 1.5 via PEG QID  25 ml free water flush before and after each feeding administration   Tube feeding regimen provides 2130 kcal (97% of needs), 89 grams of protein, and 1082 ml of H2O. Total free water: 1082 ml daily  NUTRITION DIAGNOSIS:   Inadequate oral intake related to inability to eat, dysphagia as evidenced by NPO status.  GOAL:   Patient will meet greater than or equal to 90% of their needs  MONITOR:   Diet advancement, Labs, Weight trends, TF tolerance, Skin, I & O's  REASON FOR ASSESSMENT:   Consult Assessment of nutrition requirement/status, Enteral/tube feeding initiation and management  ASSESSMENT:   Brian Liu is a 69 y.o. male with medical history significant for MGUS/multiple myeloma, hypertension, ESRD, atrial fibrillation, pancytopenia.  Pt admitted with dysphagia and hypoglycemia.   Reviewed I/O's: +942 ml x 24 hours  Attempted to speak with pt via phone call to hospital room phone, however, no answer.   Plan for nephrology consult today for HD needs.   Per general surgery notes, plan for PEG placement tomorrow (03/17/20). H&P indicates that pt has been unable to swallow for 2 days PTA. Pt was admitted to the hospital last month and was evaluated by SLP, who recommended a dysphagia 1 diet with honey thick liquids. He has had increased swallowing difficulty since discharge on 02/26/20.   Reviewed wt hx; pt has  experienced a 30% wt loss over the past month, which is highly significant for time frame. Given pt's weight loss, underweight status, and poor oral intake due to progressive dysphagia, highly suspect pt with malnutrition, however, unable to identify at this time without completion of nutrition-focused physical exam.  Medications reviewed and include dextrose 5% and 0.9% NaCl with KCl 40 mEq/L infusion @ 75 ml/hr.   Labs reviewed: K: 3.1, Mg WDL, CBGS: 82.   Diet Order:   Diet Order    None      EDUCATION NEEDS:   No education needs have been identified at this time  Skin:  Skin Assessment: Reviewed RN Assessment  Last BM:  03/14/20  Height:   Ht Readings from Last 1 Encounters:  03/15/20 6' 3" (1.905 m)    Weight:   Wt Readings from Last 1 Encounters:  03/15/20 54.4 kg    Ideal Body Weight:  89.1 kg  BMI:  Body mass index is 15 kg/m.  Estimated Nutritional Needs:   Kcal:  2200-2400  Protein:  110-125 grams  Fluid:  10000 ml + UOP    Brian Liu, RD, LDN, CDCES Registered Dietitian II Certified Diabetes Care and Education Specialist Please refer to AMION for RD and/or RD on-call/weekend/after hours pager 

## 2020-03-17 ENCOUNTER — Inpatient Hospital Stay (HOSPITAL_COMMUNITY): Payer: Medicare Other | Admitting: Certified Registered"

## 2020-03-17 ENCOUNTER — Encounter (HOSPITAL_COMMUNITY)
Admission: EM | Disposition: A | Discharge status: Skilled Nursing Facility | Payer: Self-pay | Source: Home / Self Care | Attending: Family Medicine

## 2020-03-17 ENCOUNTER — Encounter (HOSPITAL_COMMUNITY): Payer: Self-pay | Admitting: Internal Medicine

## 2020-03-17 DIAGNOSIS — R1312 Dysphagia, oropharyngeal phase: Secondary | ICD-10-CM

## 2020-03-17 DIAGNOSIS — I10 Essential (primary) hypertension: Secondary | ICD-10-CM

## 2020-03-17 DIAGNOSIS — U071 COVID-19: Secondary | ICD-10-CM

## 2020-03-17 HISTORY — PX: PEG PLACEMENT: SHX5437

## 2020-03-17 HISTORY — PX: ESOPHAGOGASTRODUODENOSCOPY (EGD) WITH PROPOFOL: SHX5813

## 2020-03-17 LAB — BASIC METABOLIC PANEL
Anion gap: 10 (ref 5–15)
BUN: 22 mg/dL (ref 8–23)
CO2: 22 mmol/L (ref 22–32)
Calcium: 9 mg/dL (ref 8.9–10.3)
Chloride: 112 mmol/L — ABNORMAL HIGH (ref 98–111)
Creatinine, Ser: 7.1 mg/dL — ABNORMAL HIGH (ref 0.61–1.24)
GFR calc Af Amer: 8 mL/min — ABNORMAL LOW (ref >60)
GFR calc non Af Amer: 7 mL/min — ABNORMAL LOW (ref >60)
Glucose, Bld: 143 mg/dL — ABNORMAL HIGH (ref 70–99)
Potassium: 4.8 mmol/L (ref 3.5–5.1)
Sodium: 144 mmol/L (ref 135–145)

## 2020-03-17 LAB — CBC
HCT: 26.5 % — ABNORMAL LOW (ref 39.0–52.0)
Hemoglobin: 7.8 g/dL — ABNORMAL LOW (ref 13.0–17.0)
MCH: 27.1 pg (ref 26.0–34.0)
MCHC: 29.4 g/dL — ABNORMAL LOW (ref 30.0–36.0)
MCV: 92 fL (ref 80.0–100.0)
Platelets: 155 10*3/uL (ref 150–400)
RBC: 2.88 MIL/uL — ABNORMAL LOW (ref 4.22–5.81)
RDW: 16 % — ABNORMAL HIGH (ref 11.5–15.5)
WBC: 3.6 10*3/uL — ABNORMAL LOW (ref 4.0–10.5)
nRBC: 0 % (ref 0.0–0.2)

## 2020-03-17 LAB — GLUCOSE, CAPILLARY
Glucose-Capillary: 107 mg/dL — ABNORMAL HIGH (ref 70–99)
Glucose-Capillary: 126 mg/dL — ABNORMAL HIGH (ref 70–99)
Glucose-Capillary: 143 mg/dL — ABNORMAL HIGH (ref 70–99)

## 2020-03-17 SURGERY — ESOPHAGOGASTRODUODENOSCOPY (EGD) WITH PROPOFOL
Anesthesia: General

## 2020-03-17 MED ORDER — LIDOCAINE HCL (CARDIAC) PF 50 MG/5ML IV SOSY
PREFILLED_SYRINGE | INTRAVENOUS | Status: DC | PRN
Start: 2020-03-17 — End: 2020-03-17
  Administered 2020-03-17: 60 mg via INTRAVENOUS

## 2020-03-17 MED ORDER — ONDANSETRON HCL 4 MG/2ML IJ SOLN: 4.0000 mg | Freq: Once | INTRAMUSCULAR | Status: DC | PRN

## 2020-03-17 MED ORDER — ALTEPLASE 2 MG IJ SOLR
2.0000 mg | Freq: Once | INTRAMUSCULAR | Status: AC | PRN
  Administered 2020-03-20: 4 mg

## 2020-03-17 MED ORDER — SODIUM CHLORIDE 0.9 % IV SOLN: 100.0000 mL | INTRAVENOUS | Status: DC | PRN

## 2020-03-17 MED ORDER — OSMOLITE 1.5 CAL PO LIQD
1000.0000 mL | ORAL | Status: DC
  Administered 2020-03-17 – 2020-03-19 (×2): 1000 mL
  Filled 2020-03-17: qty 1000

## 2020-03-17 MED ORDER — HEPARIN SODIUM (PORCINE) 1000 UNIT/ML DIALYSIS: 1000.0000 [IU] | INTRAMUSCULAR | Status: DC | PRN

## 2020-03-17 MED ORDER — LIDOCAINE 2% (20 MG/ML) 5 ML SYRINGE
INTRAMUSCULAR | Status: AC
  Filled 2020-03-17: qty 5

## 2020-03-17 MED ORDER — LIDOCAINE HCL (PF) 1 % IJ SOLN
INTRAMUSCULAR | Status: DC | PRN
  Administered 2020-03-17: 5 mL

## 2020-03-17 MED ORDER — CHLORHEXIDINE GLUCONATE CLOTH 2 % EX PADS: 6.0000 | MEDICATED_PAD | Freq: Once | CUTANEOUS | Status: DC

## 2020-03-17 MED ORDER — SODIUM CHLORIDE 0.9 % IV SOLN: INTRAVENOUS | Status: DC

## 2020-03-17 MED ORDER — CEFAZOLIN SODIUM-DEXTROSE 2-4 GM/100ML-% IV SOLN
2.0000 g | INTRAVENOUS | Status: AC
  Administered 2020-03-17: 2 g via INTRAVENOUS
  Filled 2020-03-17: qty 100

## 2020-03-17 MED ORDER — PROPOFOL 500 MG/50ML IV EMUL
INTRAVENOUS | Status: DC | PRN
  Administered 2020-03-17: 125 ug/kg/min via INTRAVENOUS
  Administered 2020-03-17: 40 mg via INTRAVENOUS

## 2020-03-17 MED ORDER — LACTATED RINGERS IV SOLN: INTRAVENOUS | Status: DC

## 2020-03-17 MED ORDER — PHENYLEPHRINE HCL (PRESSORS) 10 MG/ML IV SOLN
INTRAVENOUS | Status: DC | PRN
  Administered 2020-03-17: 120 ug via INTRAVENOUS

## 2020-03-17 MED ORDER — ORAL CARE MOUTH RINSE: 15.0000 mL | Freq: Once | OROMUCOSAL | Status: DC

## 2020-03-17 MED ORDER — PROPOFOL 10 MG/ML IV BOLUS
INTRAVENOUS | Status: AC
  Filled 2020-03-17: qty 40

## 2020-03-17 MED ORDER — CHLORHEXIDINE GLUCONATE 0.12 % MT SOLN: 15.0000 mL | Freq: Once | OROMUCOSAL | Status: DC

## 2020-03-17 MED ORDER — WATER FOR IRRIGATION, STERILE IR SOLN
Status: DC | PRN
  Administered 2020-03-17: 1000 mL

## 2020-03-17 MED ORDER — FENTANYL CITRATE (PF) 100 MCG/2ML IJ SOLN: 25.0000 ug | INTRAMUSCULAR | Status: DC | PRN

## 2020-03-17 SURGICAL SUPPLY — 6 items
CLOTH BEACON ORANGE TIMEOUT ST (SAFETY) ×2 IMPLANT
GLOVE BIOGEL PI IND STRL 6.5 (GLOVE) ×1 IMPLANT
GLOVE BIOGEL PI INDICATOR 6.5 (GLOVE) ×1
GLOVE SURG SS PI 7.5 STRL IVOR (GLOVE) ×2 IMPLANT
KIT PEG SAFETY 20FR (KITS) ×2 IMPLANT
MANIFOLD NEPTUNE II (INSTRUMENTS) ×2 IMPLANT

## 2020-03-17 NOTE — Procedures (Signed)
     HEMODIALYSIS TREATMENT NOTE:   Pre-dialysis pt was restless and irritable having had PEG tube placed earlier today.  Hypotensive throughout session; unable to tolerate any UF.  Requested to end HD early after only 2 hours.  Dr. Augustin Coupe was notified and also gave order to discontinue KCl infusion.  Net UF +500cc. All blood was returned.  Rockwell Alexandria, RN

## 2020-03-17 NOTE — Transfer of Care (Signed)
Immediate Anesthesia Transfer of Care Note  Patient: Brian Liu  Procedure(s) Performed: ESOPHAGOGASTRODUODENOSCOPY (EGD) WITH PROPOFOL (N/A ) PERCUTANEOUS ENDOSCOPIC GASTROSTOMY (PEG) PLACEMENT (N/A )  Patient Location: Endoscopy Unit  Anesthesia Type:General  Level of Consciousness: awake, alert , oriented and patient cooperative  Airway & Oxygen Therapy: Patient Spontanous Breathing and Patient connected to nasal cannula oxygen  Post-op Assessment: Report given to RN, Post -op Vital signs reviewed and stable and Patient moving all extremities  Post vital signs: Reviewed and unstable  Last Vitals:  Vitals Value Taken Time  BP    Temp    Pulse 70 03/17/20 1234  Resp 21 03/17/20 1234  SpO2 99 % 03/17/20 1234  Vitals shown include unvalidated device data.  Last Pain:  Vitals:   03/17/20 1117  TempSrc:   PainSc: 0-No pain         Complications: No complications documented.

## 2020-03-17 NOTE — Progress Notes (Signed)
Peg tube feeding began as ordered @ 83ml/hr. Will advance as tolerated. Will cont to monitor. Pt has no complaints at this time.

## 2020-03-17 NOTE — Progress Notes (Signed)
Pt transferred down for EGD/Peg tube placement.

## 2020-03-17 NOTE — Progress Notes (Signed)
Patient Demographics:    Brian Liu, is a 69 y.o. male, DOB - [birth year], CLE:[id number]  Admit date - 03/15/2020   Admitting Physician Ejiroghene Arlyce Dice, MD  Outpatient Primary MD for the patient is [name], MD  LOS - 2   Chief Complaint  Patient presents with  . Dysphagia        Subjective:    Brian Liu today has no fevers, no emesis,  No chest pain,   -No vomiting or diarrhea -Patient somewhat frustrated with the delay in hemodialysis and delay with PEG tube placement  Assessment  & Plan :    Principal Problem:   Dysphagia Active Problems:   Hypertension   Mitral regurgitation   ESRD (end stage renal disease) (HCC)   Atrial fibrillation (HCC)   Multiple myeloma ([place])   COVID-19 virus infection   MGUS (monoclonal gammopathy of unknown significance)  Brief Summary:- Brian Liu is a 69 y.o. male with medical history significant for MGUS/multiple myeloma, hypertension, ESRD, atrial fibrillation, pancytopenia, history of recent COVID-19 infection (02/22/20), status post prior Covid 19 vaccination history of presumed TIAs with negative MRI on 02/18/2020 admitted on 03/15/2020 with dysphagia and failure to thrive and needing PEG tube placement for nutritional purposes  A/p 1)Dysphagia and Failure to Thrive--PTA patient was getting speech therapy Had modified barium swallow on 02/18/2020-suggesting oropharyngeal dysfunction without any structural esophageal problems despite prior history of radiation therapy -Recent EGD  was unremarkable --General surgery consult for PEG tube placement on 03/17/2020 -Dietitian/nutritionist consult appreciated -Plan is for Osmolite 1.5 infusion with slow titration with water flushes once PEG tube is placed alternatively---  bolus feedings 355 ml (1.5 cans) Osmolite 1.5 via PEG QID 25 ml free water flush before and after each feeding administration    -Continue IV dextrose for now  2)ESRD--- gets HD Monday Wednesdays and Fridays, missed HD session on 03/15/2020, nephrology consult for HD requested -Plan is for HD on 03/17/2020  3)Chronic Atrial fibrillation----appears rate is controlled without AV nodal blocking agents, -Not on anticoagulation due to high risk of bleeding--patient has history of chronic subdural hematomas as well  4)Chronic Hypotension--- continue midodrine, very judicious with IV fluids given ESRD status  5)Hypothyroidism--- continue Synthroid  6) chronic anemia of CKD--- EPO/ESA agent per nephrology team, hemoglobin currently stable around 8  7) history of prior left occipital lobe stroke and Chronic bilateral cerebral convexity subdural hematomas--- please see MRI from July 2021 --Supportive management  8)FEN--- Tube Feeding---PEG tube feeding as below:-  --I Dr. Roxan Hockey anticipate that Brian Liu will need tube feeding from more than 90 days due to significant dysphagia and failure to thrive  Initiate Osmolite 1.5 @ 25 ml/hr via PEG and increase by 10 ml every 8 hours to goal rate of 65 ml/hr.    Tube feeding regimen provides 2340 kcal (100% of needs), 98 grams of protein, and 1189 ml of H2O.   -Recommend monitor Mg, K, and Phos daily x 3 days and replete as needed due to high refeeding risk   -If bolus feedings are desired at discharge, recommend:   355 ml (1.5 cans) Osmolite 1.5 via PEG QID  25 ml free water flush before and after each feeding administration   Tube feeding  regimen provides 2130 kcal (97% of needs), 89 grams of protein, and 1082 ml of H2O. Total free water: 1082 ml daily  ---I Dr. Roxan Hockey anticipate that Brian Liu will need tube feeding from more than 90 days due to significant dysphagia and failure to thrive   Disposition/Need for in-Hospital Stay- patient unable to be discharged at this time due to --- failure to thrive due to dysphagia  resulting in hypoglycemia requiring IV dextrose infusion pending PEG tube placement  Status is: Inpatient  Remains inpatient appropriate because:failure to thrive due to dysphagia resulting in hypoglycemia requiring IV dextrose infusion pending PEG tube placement   Disposition: The patient is from: Home              Anticipated d/c is to: Home              Anticipated d/c date is: 2 days              Patient currently is not medically stable to d/c. Barriers: Not Clinically Stable- failure to thrive due to dysphagia resulting in hypoglycemia requiring IV dextrose infusion pending PEG tube placement  Code Status : full  Family Communication:  (patient is alert, awake and coherent)   Consults  :  Nephrology/Gen Surgery  DVT Prophylaxis  :   SCDs   Lab Results  Component Value Date   PLT 155 03/17/2020    Inpatient Medications  Scheduled Meds: . [MAR Hold] Chlorhexidine Gluconate Cloth  6 each Topical Q0600  . [MAR Hold] midodrine  10 mg Oral TID WC   Continuous Infusions: . dextrose 5 % and 0.9 % NaCl with KCl 40 mEq/L 75 mL/hr at 03/15/20 2257  . [START ON 03/18/2020] feeding supplement (OSMOLITE 1.5 CAL)     PRN Meds:.[MAR Hold] acetaminophen **OR** [MAR Hold] acetaminophen, [MAR Hold] ondansetron **OR** [MAR Hold] ondansetron (ZOFRAN) IV    Anti-infectives (From admission, onward)   None        Objective:   Vitals:   03/16/20 2207 03/17/20 0500 03/17/20 0655 03/17/20 0659  BP: (!) 108/36  (!) 129/38 111/71  Pulse: 74  70 74  Resp: 17     Temp: 97.6 F (36.4 C)     TempSrc:      SpO2:      Weight:  60.2 kg    Height:        Wt Readings from Last 3 Encounters:  03/17/20 60.2 kg  02/25/20 61.5 kg  02/17/20 78 kg    No intake or output data in the 24 hours ending 03/17/20 1114   Physical Exam  Gen:- Awake Alert,  In no apparent distress  HEENT:- [place].AT, No sclera icterus Neck-Supple Neck, right IJ HD catheter Lungs-  CTAB , fair symmetrical air  movement CV- S1, S2 normal, regular  Abd-  +ve B.Sounds, Abd Soft, No tenderness,   Extremity/Skin:- No  edema, pedal pulses present  Psych-affect is appropriate, oriented x3 Neuro-generalized weakness, no new focal deficits, no tremors MSK-nonfunctional left forearm AV fistula   Data Review:   Micro Results No results found for this or any previous visit (from the past 240 hour(s)).  Radiology Reports DG Chest Portable 1 View  Result Date: 03/15/2020 CLINICAL DATA:  Difficulty swallowing for 2 days. The patient tested positive for COVID-19 02/22/2020. EXAM: PORTABLE CHEST 1 VIEW COMPARISON:  Single-view of the chest 02/23/2020. PA and lateral chest 12/05/2016. FINDINGS: The lungs are clear. There is cardiomegaly and aortic atherosclerosis. A few calcified  pleural plaques are noted. Right IJ approach dialysis catheter is in place and the patient is status post right shoulder replacement. IMPRESSION: No acute disease. Cardiomegaly. Aortic Atherosclerosis (ICD10-I70.0). Electronically Signed   By: Inge Rise M.D.   On: 03/15/2020 14:58   DG Chest Port 1 View  Result Date: 02/23/2020 CLINICAL DATA:  Cough and inability to swallow.  COVID-19 positive. EXAM: PORTABLE CHEST 1 VIEW COMPARISON:  07/10/2018 FINDINGS: A right jugular dialysis catheter remains in place terminating over the high right atrium. The cardiac silhouette remains mildly enlarged. Aortic atherosclerosis is noted. The lungs are hyperinflated. Mildly accentuated interstitial markings bilaterally are similar to the prior study. No confluent airspace opacity, edema, sizable pleural effusion, or pneumothorax is identified. Calcified pleural plaques are noted. Oral contrast material is partially visualized in the colon. A right shoulder arthroplasty is noted. IMPRESSION: Chronic findings without evidence of acute cardiopulmonary disease. Electronically Signed   By: Logan Bores M.D.   On: 02/23/2020 08:11     CBC Recent Labs    Lab 03/15/20 1700 03/17/20 0650  WBC 3.2* 3.6*  HGB 8.0* 7.8*  HCT 26.8* 26.5*  PLT 146* 155  MCV 91.8 92.0  MCH 27.4 27.1  MCHC 29.9* 29.4*  RDW 15.7* 16.0*    Chemistries  Recent Labs  Lab 03/15/20 1700 03/16/20 0412 03/17/20 0650  NA 137 141 144  K 3.1* 3.4* 4.8  CL 99 104 112*  CO2 23 21* 22  GLUCOSE 64* 106* 143*  BUN '21 21 22  ' CREATININE 5.90* 6.15* 7.10*  CALCIUM 8.8* 9.0 9.0  MG 1.9  --   --   AST 13*  --   --   ALT 7  --   --   ALKPHOS 42  --   --   BILITOT 1.2  --   --    ------------------------------------------------------------------------------------------------------------------ No results for input(s): CHOL, HDL, LDLCALC, TRIG, CHOLHDL, LDLDIRECT in the last 72 hours.  Lab Results  Component Value Date   HGBA1C 5.1 02/23/2020   ------------------------------------------------------------------------------------------------------------------ No results for input(s): TSH, T4TOTAL, T3FREE, THYROIDAB in the last 72 hours.  Invalid input(s): FREET3 ------------------------------------------------------------------------------------------------------------------ Recent Labs    03/15/20 1637  FERRITIN 703*  TIBC 147*  IRON 63    Coagulation profile No results for input(s): INR, PROTIME in the last 168 hours.  No results for input(s): DDIMER in the last 72 hours.  Cardiac Enzymes No results for input(s): CKMB, TROPONINI, MYOGLOBIN in the last 168 hours.  Invalid input(s): CK ------------------------------------------------------------------------------------------------------------------    Component Value Date/Time   BNP 423.0 (H) 12/27/2016 1510     Roxan Hockey M.D on 03/17/2020 at 11:14 AM  Go to [email or web address] - for contact info  [facility] - Office  [phone]

## 2020-03-17 NOTE — Progress Notes (Signed)
Nutrition Follow-up  RD working remotely.  DOCUMENTATION CODES:   Underweight  INTERVENTION:   Initiate Osmolite 1.5 @ 25 ml/hr via PEG and increase by 10 ml every 8 hours to goal rate of 65 ml/hr.    Tube feeding regimen provides 2340 kcal (100% of needs), 98 grams of protein, and 1189 ml of H2O.   -Recommend monitor Mg, K, and Phos daily x 3 days and replete as needed due to high refeeding risk   -If bolus feedings are desired at discharge, recommend:   355 ml (1.5 cans) Osmolite 1.5 via PEG QID  25 ml free water flush before and after each feeding administration   Tube feeding regimen provides 2130 kcal (97% of needs), 89 grams of protein, and 1082 ml of H2O. Total free water: 1082 ml daily  NUTRITION DIAGNOSIS:   Inadequate oral intake related to inability to eat, dysphagia as evidenced by NPO status.  Ongoing  GOAL:   Patient will meet greater than or equal to 90% of their needs  Progressing; TF to be initiated today  MONITOR:   Diet advancement, Labs, Weight trends, TF tolerance, Skin, I & O's  REASON FOR ASSESSMENT:   Consult Assessment of nutrition requirement/status, Enteral/tube feeding initiation and management  ASSESSMENT:   Brian Liu is a 69 y.o. male with medical history significant for MGUS/multiple myeloma, hypertension, ESRD, atrial fibrillation, pancytopenia.  8/13- PEG placed, TF initiated  Reviewed I/O's: +300 ml x 24 hours and +1.2 L x 24 hours  Case discussed with Dr. Arnoldo Morale, who reports pt's PEG has been placed and ready to use. MD states able to start TF now.   Labs reviewed: CBGS: 89-100.  Diet Order:   Diet Order            Diet NPO time specified  Diet effective midnight                 EDUCATION NEEDS:   No education needs have been identified at this time  Skin:  Skin Assessment: Skin Integrity Issues: Skin Integrity Issues:: Incisions Incisions: closed abdomen, s/p PEG placement 03/17/20  Last BM:   03/15/20  Height:   Ht Readings from Last 1 Encounters:  03/17/20 _0  (1.905 m)    Weight:   Wt Readings from Last 1 Encounters:  03/17/20 60.2 kg    Ideal Body Weight:  89.1 kg  BMI:  Body mass index is 16.59 kg/m.  Estimated Nutritional Needs:   Kcal:  2200-2400  Protein:  105-120 grams  Fluid:  10000 ml + UOP    Loistine Chance, RD, LDN, [place] Registered Dietitian II Certified Diabetes Care and Education Specialist Please refer to [facility] for RD and/or RD on-call/weekend/after hours pager

## 2020-03-17 NOTE — Anesthesia Postprocedure Evaluation (Signed)
Anesthesia Post Note  Patient: Brian Liu  Procedure(s) Performed: ESOPHAGOGASTRODUODENOSCOPY (EGD) WITH PROPOFOL (N/A ) PERCUTANEOUS ENDOSCOPIC GASTROSTOMY (PEG) PLACEMENT (N/A )  Patient location during evaluation: Endoscopy Anesthesia Type: General Level of consciousness: awake, oriented, patient cooperative and awake and alert Pain management: pain level controlled Vital Signs Assessment: post-procedure vital signs reviewed and stable Respiratory status: spontaneous breathing, respiratory function stable, nonlabored ventilation and patient connected to nasal cannula oxygen Cardiovascular status: blood pressure returned to baseline and stable Postop Assessment: no headache and no backache Anesthetic complications: no   No complications documented.   Last Vitals:  Vitals:   03/17/20 1117 03/17/20 1145  BP: (!) 136/57 99/73  Pulse: 76 81  Resp: 16 (!) 22  Temp: (!) 36.3 C   SpO2: 100% 100%    Last Pain:  Vitals:   03/17/20 1117  TempSrc:   PainSc: 0-No pain                 Tacy Learn

## 2020-03-17 NOTE — TOC Progression Note (Signed)
Transition of Care [facility]) - Progression Note    Patient Details  Name: Brian Liu MRN: [id number] Date of Birth: March 13, 1951  [facility] of [facility]) CM/SW Contact  Shade Flood, LCSW Phone Number: 03/17/2020, 1:51 PM  Clinical Narrative:     TOC following. Pt discussed with MD today. Pt had PEG tube placed today. Tube Feeds will be initiated today and rate increased as tolerated. Plan is for hopeful dc Sunday on Bolus feeds. Updated Pam at [facility] (turns out pt already active with [facility] not with [facility]). They are aware of plan and will follow for tube feed needs at dc. [facility] to deliver all supplies to pt's home today. [facility] to send RN on Monday.   Weekend TOC will follow and assist.  Expected Discharge Plan: [place] Barriers to Discharge: Continued Medical Work up  Expected Discharge Plan and Services Expected Discharge Plan: [place] In-house Referral: Clinical Social Work   Post Acute Care Choice: Resumption of Svcs/PTA Provider Living arrangements for the past 2 months: Single Family Home                 DME Arranged: Tube feeding DME Agency: Other - Comment ([facility]) Date DME Agency Contacted: 03/16/20   Representative spoke with at DME Agency: [place]: RN [place] Agency: [facility] [place] Date [place]: 03/17/20   Representative spoke with at [place]: [place] ([place]) Interventions    Readmission Risk Interventions Readmission Risk Prevention Plan 03/16/2020  Transportation Screening Complete  Home Care Screening Complete  Medication Review (RN CM) Complete  Some recent data might be hidden

## 2020-03-17 NOTE — Op Note (Signed)
Patient:  Brian Liu  DOB:  02-25-1951  MRN:  [id number]   Preop Diagnosis:  Dysphagia   Postop Diagnosis:  Same  Procedure:  EGD with PEG  Surgeon:  Aviva Signs, MD   Asst:  Curlene Labrum, MD   Anes:  MAC  Indications: Patient is a 69 year old white male with multiple medical problems who suffers from malnutrition secondary to oral pharyngeal dysphagia.  The patient now presents for a PEG.  The risks and benefits of the procedure including bleeding, infection, and organ injury were fully explained to the patient, who gave informed consent.  Procedure note: The patient was placed in the supine position.  After monitored anesthesia care was given, the abdomen was prepped and draped using usual sterile technique with Betadine.  Surgical site confirmation was performed.  The endoscope was advanced into the second portion of the duodenum without difficulty.  The pylorus was noted to be widely patent.  No ulcerations were seen.  An area in the antrum was palpated and transilluminated without difficulty.  1% Xylocaine was used for local anesthesia.  A needle catheter was then advanced after a small incision was made in the epigastric region into the stomach under direct visualization without difficulty.  A guidewire was then advanced into the stomach and this was grasped using the snare.  The endoscope was then removed.  A 20 French gastrostomy tube was then attached to the wire and using the Ponsky pull technique, was placed in the 3 cm Ermagene Saidi at the skin level without difficulty.  The endoscope was then advanced back down into the stomach and confirmation of adequate placement was done.  All fluid and air were then evacuated from the stomach prior to removal of the endoscope.  The bolster was then placed along with Betadine ointment and dry sterile dressing at the skin exit site.  The patient tolerated the procedure well.  He was transferred back to PACU in stable condition.  Complications:  None  EBL: Minimal  Specimen: None

## 2020-03-17 NOTE — Anesthesia Preprocedure Evaluation (Signed)
Anesthesia Evaluation  Patient identified by MRN, date of birth, ID band Patient awake    Reviewed: Allergy & Precautions, H&P , NPO status , Patient's Chart, lab work & pertinent test results, reviewed documented beta blocker date and time   Airway Mallampati: II  TM Distance: >3 FB Neck ROM: full    Dental no notable dental hx.    Pulmonary neg pulmonary ROS,    Pulmonary exam normal breath sounds clear to auscultation       Cardiovascular Exercise Tolerance: Good hypertension, + dysrhythmias Atrial Fibrillation  Rhythm:regular Rate:Normal     Neuro/Psych negative neurological ROS  negative psych ROS   GI/Hepatic negative GI ROS, Neg liver ROS,   Endo/Other  negative endocrine ROS  Renal/GU ESRF and DialysisRenal disease  negative genitourinary   Musculoskeletal   Abdominal   Peds  Hematology  (+) Blood dyscrasia, anemia ,   Anesthesia Other Findings   Reproductive/Obstetrics negative OB ROS                             Anesthesia Physical Anesthesia Plan  ASA: IV  Anesthesia Plan: General   Post-op Pain Management:    Induction:   PONV Risk Score and Plan: 1 and Propofol infusion  Airway Management Planned:   Additional Equipment:   Intra-op Plan:   Post-operative Plan:   Informed Consent: I have reviewed the patients History and Physical, chart, labs and discussed the procedure including the risks, benefits and alternatives for the proposed anesthesia with the patient or authorized representative who has indicated his/her understanding and acceptance.     Dental Advisory Given  Plan Discussed with: CRNA  Anesthesia Plan Comments:         Anesthesia Quick Evaluation

## 2020-03-17 NOTE — Interval H&P Note (Signed)
History and Physical Interval Note:  03/17/2020 10:41 AM  Brian Liu  has presented today for surgery, with the diagnosis of dysphagia.  The various methods of treatment have been discussed with the patient and family. After consideration of risks, benefits and other options for treatment, the patient has consented to  Procedure(s) with comments: ESOPHAGOGASTRODUODENOSCOPY (EGD) WITH PROPOFOL (N/A) PERCUTANEOUS ENDOSCOPIC GASTROSTOMY (PEG) PLACEMENT (N/A) - ok per Caren Griffins as a surgical intervention.  The patient's history has been reviewed, patient examined, no change in status, stable for surgery.  I have reviewed the patient's chart and labs.  Questions were answered to the patient's satisfaction.     Aviva Signs

## 2020-03-17 NOTE — Progress Notes (Signed)
Nephrology Follow-Up Consult note   Assessment/Recommendations: Brian Liu is a/an 69 y.o. male with a past medical history notable for ESRD on HD admitted with dysphagia and PEG tube placement  # ESRD: Continue outpt prescription and schedule, dialysis today: 3.25hrs, 2K, 2.5Ca, Na 138 (will use 137), Bicarbonate 37 (will use 35), 35.5C, Dialyzer: F180 in place of gambro revaclear 300, holding heparin given likely procedure  # Volume/ hypotension: EDW 82kg. Also likely losing weight. Will run even for now  # Anemia of Chronic Kidney Disease: Hemoglobin 8. Currently receiving no ESAs due to myeloma.  Iron saturation ferritin elevated (43 and 703 respectively).  Will not give IV iron  # Secondary Hyperparathyroidism/Hyperphosphatemia: On hecterol 0.42mcg MWF and sensipar 30mg  nightly. Holding [place] given not eating.  Restart Sensipar 30 mg nightly once PEG tube placed  # Vascular access: Recently placed AVF, maturing. Use right tunneled CVC for now  # Hospital problem: Dysphagia: PEG tube placement per primary team.  Hopefully today  hypertension: Chronic problem.  Asymptomatic.  Midodrine as tolerated  # Additional recommendations: - Dose all meds for creatinine clearance <10 ml/min  - Unless absolutely necessary, no MRIs with gadolinium.  - Implement save arm precautions. Prefer needle sticks in the dorsum of the hands or wrists. No blood pressure measurements in arm. - If blood transfusion is requested during hemodialysis sessions, please alert [place] prior to the session.    Recommendations conveyed to primary service.    [facility] 03/17/2020 9:08 AM  ___________________________________________________________  CC: ESRD  Interval History/Subjective: Relatively stable over the past 24 hours.  Some hypotension this morning.  Attempted to give midodrine but patient is having a hard time swallowing this medication.  He does not seem to  be symptomatic from hypotension.  Patient frustrated today stating that he does not understand why he is in the hospital.  Nurse reports that intermittently he is refusing PEG tube but at this time he wants to proceed.   Medications:  Current Facility-Administered Medications  Medication Dose Route Frequency Provider Last Rate Last Admin  . acetaminophen (TYLENOL) tablet 650 mg  650 mg Oral Q6H PRN Emokpae, Ejiroghene E, MD       Or  . acetaminophen (TYLENOL) suppository 650 mg  650 mg Rectal Q6H PRN Emokpae, Ejiroghene E, MD      . Chlorhexidine Gluconate Cloth 2 % PADS 6 each  6 each Topical Q0600 Reesa Chew, MD   6 each at 03/17/20 [phone]  . dextrose 5 % and 0.9 % NaCl with KCl 40 mEq/L infusion   Intravenous Continuous Emokpae, Ejiroghene E, MD 75 mL/hr at 03/15/20 2257 New Bag at 03/15/20 2257  . [START ON 03/18/2020] feeding supplement (OSMOLITE 1.5 CAL) liquid 1,000 mL  1,000 mL Per Tube Continuous Emokpae, Courage, MD      . midodrine (PROAMATINE) tablet 10 mg  10 mg Oral TID WC Emokpae, Courage, MD   10 mg at 03/17/20 0817  . ondansetron (ZOFRAN) tablet 4 mg  4 mg Oral Q6H PRN Emokpae, Ejiroghene E, MD       Or  . ondansetron (ZOFRAN) injection 4 mg  4 mg Intravenous Q6H PRN Emokpae, Ejiroghene E, MD          Review of Systems: 10 systems reviewed and negative except per interval history/subjective  Physical Exam: Vitals:   03/17/20 0655 03/17/20 0659  BP: (!) 129/38 111/71  Pulse: 70 74  Resp:    Temp:  SpO2:     No intake/output data recorded. No intake or output data in the 24 hours ending 03/17/20 0908 Constitutional: Chronically ill-appearing, frail, thin, no significant distress ENMT: ears and nose without scars or lesions, MMM CV: normal rate, no edema Respiratory: Bilateral chest rise, normal work of breathing Gastrointestinal: soft, non-tender, no palpable masses or hernias Skin: no visible lesions or rashes Psych: alert, judgment/insight seem to be  diminished, angry   Test Results I personally reviewed new and old clinical labs and radiology tests Lab Results  Component Value Date   NA 144 03/17/2020   K 4.8 03/17/2020   CL 112 (H) 03/17/2020   CO2 22 03/17/2020   BUN 22 03/17/2020   CREATININE 7.10 (H) 03/17/2020   CALCIUM 9.0 03/17/2020   ALBUMIN 3.0 (L) 03/15/2020   PHOS 2.5 02/26/2020

## 2020-03-18 DIAGNOSIS — D62 Acute posthemorrhagic anemia: Secondary | ICD-10-CM | POA: Diagnosis present

## 2020-03-18 LAB — GLUCOSE, CAPILLARY
Glucose-Capillary: 111 mg/dL — ABNORMAL HIGH (ref 70–99)
Glucose-Capillary: 119 mg/dL — ABNORMAL HIGH (ref 70–99)
Glucose-Capillary: 121 mg/dL — ABNORMAL HIGH (ref 70–99)
Glucose-Capillary: 131 mg/dL — ABNORMAL HIGH (ref 70–99)
Glucose-Capillary: 94 mg/dL (ref 70–99)
Glucose-Capillary: 97 mg/dL (ref 70–99)

## 2020-03-18 LAB — RENAL FUNCTION PANEL
Albumin: 2.6 g/dL — ABNORMAL LOW (ref 3.5–5.0)
Anion gap: 10 (ref 5–15)
BUN: 27 mg/dL — ABNORMAL HIGH (ref 8–23)
CO2: 25 mmol/L (ref 22–32)
Calcium: 8.9 mg/dL (ref 8.9–10.3)
Chloride: 108 mmol/L (ref 98–111)
Creatinine, Ser: 5.54 mg/dL — ABNORMAL HIGH (ref 0.61–1.24)
GFR calc Af Amer: 11 mL/min — ABNORMAL LOW (ref >60)
GFR calc non Af Amer: 10 mL/min — ABNORMAL LOW (ref >60)
Glucose, Bld: 120 mg/dL — ABNORMAL HIGH (ref 70–99)
Phosphorus: 2.3 mg/dL — ABNORMAL LOW (ref 2.5–4.6)
Potassium: 4.1 mmol/L (ref 3.5–5.1)
Sodium: 143 mmol/L (ref 135–145)

## 2020-03-18 LAB — CBC
HCT: 21.7 % — ABNORMAL LOW (ref 39.0–52.0)
Hemoglobin: 6.4 g/dL — CL (ref 13.0–17.0)
MCH: 27.4 pg (ref 26.0–34.0)
MCHC: 29.5 g/dL — ABNORMAL LOW (ref 30.0–36.0)
MCV: 92.7 fL (ref 80.0–100.0)
Platelets: 111 10*3/uL — ABNORMAL LOW (ref 150–400)
RBC: 2.34 MIL/uL — ABNORMAL LOW (ref 4.22–5.81)
RDW: 16.1 % — ABNORMAL HIGH (ref 11.5–15.5)
WBC: 4.4 10*3/uL (ref 4.0–10.5)
nRBC: 0 % (ref 0.0–0.2)

## 2020-03-18 LAB — ABO/RH: ABO/RH(D): O POS

## 2020-03-18 LAB — OCCULT BLOOD X 1 CARD TO LAB, STOOL: Fecal Occult Bld: POSITIVE — AB

## 2020-03-18 LAB — OCCULT BLOOD GASTRIC / DUODENUM (SPECIMEN CUP): Occult Blood, Gastric: NEGATIVE

## 2020-03-18 LAB — PREPARE RBC (CROSSMATCH)

## 2020-03-18 MED ORDER — PANTOPRAZOLE SODIUM 40 MG IV SOLR
40.0000 mg | Freq: Two times a day (BID) | INTRAVENOUS | Status: DC
  Administered 2020-03-18 – 2020-03-21 (×7): 40 mg via INTRAVENOUS
  Filled 2020-03-18 (×7): qty 40

## 2020-03-18 MED ORDER — SODIUM CHLORIDE 0.9% IV SOLUTION: Freq: Once | INTRAVENOUS | Status: DC

## 2020-03-18 MED ORDER — METOCLOPRAMIDE HCL 5 MG/5ML PO SOLN
5.0000 mg | Freq: Two times a day (BID) | ORAL | Status: AC
  Administered 2020-03-18 (×2): 5 mg via ORAL
  Filled 2020-03-18 (×2): qty 10

## 2020-03-18 NOTE — Progress Notes (Signed)
Was notified of patient's drop in hemoglobin.  I suspect this may be acute on chronic blood loss, though the actual blood loss from his PEG placement was minimal.  I did check the PEG site and there is no active bleeding noted.  The bolster still is at the 3 cm Calianne Larue.  Patient denies any hematemesis.  I would just monitor his hemoglobin at this point.  No need for repeat EGD at this time.

## 2020-03-18 NOTE — Progress Notes (Signed)
1 Day Post-Op  Subjective: Minimal PEG site pain.  Objective: Vital signs in last 24 hours: Temp:  [97.4 F (36.3 C)-98.2 F (36.8 C)] 97.5 F (36.4 C) (08/13 2200) Pulse Rate:  [72-97] 97 (08/13 2200) Resp:  [16-26] 16 (08/13 2200) BP: (54-140)/(34-73) 99/56 (08/13 2200) SpO2:  [95 %-100 %] 96 % (08/13 2200) Weight:  [60.1 kg-60.3 kg] 60.3 kg (08/14 0500) Last BM Date: 03/15/20  Intake/Output from previous day: 08/13 0701 - 08/14 0700 In: 2663.6 [I.V.:2413.6; NG/GT:150; IV Piggyback:100] Out: -500  Intake/Output this shift: No intake/output data recorded.  General appearance: alert, cooperative and no distress GI: Soft, flat.  PEG site clean and dry without leakage.  Bolster at 3 cm Stevie Charter.  Lab Results:  Recent Labs    03/15/20 1700 03/17/20 0650  WBC 3.2* 3.6*  HGB 8.0* 7.8*  HCT 26.8* 26.5*  PLT 146* 155   BMET Recent Labs    03/16/20 0412 03/17/20 0650  NA 141 144  K 3.4* 4.8  CL 104 112*  CO2 21* 22  GLUCOSE 106* 143*  BUN 21 22  CREATININE 6.15* 7.10*  CALCIUM 9.0 9.0   PT/INR No results for input(s): LABPROT, INR in the last 72 hours.  Studies/Results: No results found.  Anti-infectives: Anti-infectives (From admission, onward)   Start     Dose/Rate Route Frequency Ordered Stop   03/17/20 1145  ceFAZolin (ANCEF) IVPB 2g/100 mL premix        2 g 200 mL/hr over 30 Minutes Intravenous On call to O.R. 03/17/20 1133 03/17/20 1239      Assessment/Plan: s/p Procedure(s): ESOPHAGOGASTRODUODENOSCOPY (EGD) WITH PROPOFOL PERCUTANEOUS ENDOSCOPIC GASTROSTOMY (PEG) PLACEMENT Impression: Stable postoperatively.  Tolerating increasing tube feeds well.  Please call me if I can be of further assistance.  LOS: 3 days    Aviva Signs 03/18/2020

## 2020-03-18 NOTE — Progress Notes (Signed)
Patient has peg tube residual of 280cc.  Tube feeding to be held for 2 hours and restarted at 20cc/hr per MD

## 2020-03-18 NOTE — Progress Notes (Signed)
Patient Demographics:    Brian Liu, is a 69 y.o. male, DOB - 10-30-1950, TWS:[id number]  Admit date - 03/15/2020   Admitting Physician Ejiroghene Arlyce Dice, MD  Outpatient Primary MD for the patient is [name], MD  LOS - 3   Chief Complaint  Patient presents with  . Dysphagia        Subjective:    Brian Liu today has no fevers, no emesis,  No chest pain,   -No vomiting , denies abdominal pain  -Had high residuals with PEG tube feeding -PEG tube feeding on hold, gastric aspirate appears to be coffee-ground type material, patient had dark loose stools--Hemoccult positive -Discussed with general surgeon Dr. Arnoldo Morale and on-call GI physician Dr. Gala Romney -Hemoglobin is down to 6.4 from a baseline usually around 8  Assessment  & Plan :    Principal Problem:   Dysphagia/FTT Active Problems:   Hypertension   Mitral regurgitation   ESRD (end stage renal disease) (HCC)   Atrial fibrillation (HCC)   Multiple myeloma ([place])   COVID-19 virus infection   MGUS (monoclonal gammopathy of unknown significance)   Acute blood loss as cause of postoperative anemia-- Superimposed on Chronic Anemia of CKD  Brief Summary:- Brian Liu is a 69 y.o. male with medical history significant for MGUS/multiple myeloma, hypertension, ESRD, atrial fibrillation, pancytopenia, history of recent COVID-19 infection (02/22/20), status post prior Covid 19 vaccination history of presumed TIAs with negative MRI on 02/18/2020 admitted on 03/15/2020 with dysphagia and failure to thrive and needing PEG tube placement for nutritional purposes -Status post PEG tube placement on 03/17/2020  A/p 1)Dysphagia and Failure to Thrive--PTA patient was getting speech therapy Had modified barium swallow on 02/18/2020-suggesting oropharyngeal dysfunction without any structural esophageal problems despite prior history of radiation  therapy -Recent EGD  was unremarkable S/p PEG placement on 03/17/2020 -Dietitian/nutritionist consult appreciated -Plan is for Osmolite 1.5 infusion with slow titration with water flushes once PEG tube is placed alternatively---  bolus feedings 355 ml (1.5 cans) Osmolite 1.5 via PEG QID 25 ml free water flush before and after each feeding administration  --03/18/20-Had high residuals with PEG tube feeding -PEG tube feeding on hold, gastric aspirate appears to be coffee-ground type material, patient had dark loose stools--Hemoccult positive -Discussed with general surgeon Dr. Arnoldo Morale and on-call GI physician Dr. Gala Romney -Hemoglobin is down to 6.4 from a baseline usually around 8  2)Acute on chronic Anemia--- patient with chronic anemia of ESRD with baseline hemoglobin usually between 8 and 9 -Anemia is presumed to be due to acute blood loss in the setting of recent PEG tube placement with GI blood loss- -PEG tube feeding on hold, gastric aspirate appears to be coffee-ground type material, patient had dark loose stools--Hemoccult positive -Discussed with general surgeon Dr. Arnoldo Morale and on-call GI physician Dr. Gala Romney -Hemoglobin is down to 6.4 from a baseline usually around 8 -Patient's blood pressure has been soft, soft BP med is very challenging to do hemodialysis on 03/17/2020,  --given drop in hemoglobin and soft BP will Transfuse 1 unit of PRBC very slowly -Give IV Protonix and monitor H&H closely -ESA/Procrit per nephrology team  3)Chronic Atrial fibrillation----appears rate is controlled without AV nodal blocking agents, -Not on anticoagulation due to high risk of bleeding--patient  has history of chronic subdural hematomas as well  4) ESRD--- gets HD Monday Wednesdays and Fridays, missed HD session on 03/15/2020, nephrology consult for HD appreciated -Had HD on 03/17/2020  5)Hypothyroidism--- continue Synthroid  6)Chronic Hypotension--- continue midodrine, very judicious with IV fluids  given ESRD status  7) history of prior left occipital lobe stroke and Chronic bilateral cerebral convexity subdural hematomas--- please see MRI from July 2021 --Supportive management  8)FEN--- Tube Feeding---PEG tube feeding as above #1 --I Dr. Roxan Hockey anticipate that Brian Liu will need tube feeding from more than 90 days due to significant dysphagia and failure to thrive  Disposition/Need for in-Hospital Stay- patient unable to be discharged at this time due to --- failure to thrive due to dysphagia resulting in hypoglycemia requiring  PEG tube placement -Acute blood loss/GI bleed requiring transfusion of PRBCs in the setting of hemodynamic  concerns/soft BP  Status is: Inpatient  Remains inpatient appropriate because: failure to thrive due to dysphagia resulting in hypoglycemia requiring  PEG tube placement -Acute blood loss/GI bleed requiring transfusion of PRBCs in the setting of hemodynamic  concerns/soft BP   Disposition: The patient is from: Home              Anticipated d/c is to: Home              Anticipated d/c date is: 2 days              Patient currently is not medically stable to d/c. Barriers: Not Clinically Stable-  failure to thrive due to dysphagia resulting in hypoglycemia requiring  PEG tube placement -Acute blood loss/GI bleed requiring transfusion of PRBCs in the setting of hemodynamic  concerns/soft BP  Code Status : full  Family Communication:  (patient is alert, awake and coherent)   Consults  :  Nephrology/Gen Surgery/  DVT Prophylaxis  :   SCDs   Lab Results  Component Value Date   PLT 111 (L) 03/18/2020    Inpatient Medications  Scheduled Meds: . sodium chloride   Intravenous Once  . Chlorhexidine Gluconate Cloth  6 each Topical Q0600  . metoCLOPramide  5 mg Oral BID  . midodrine  10 mg Oral TID WC  . pantoprazole (PROTONIX) IV  40 mg Intravenous Q12H   Continuous Infusions: . sodium chloride    . sodium chloride    .  feeding supplement (OSMOLITE 1.5 CAL) 35 mL/hr at 03/17/20 2300   PRN Meds:.sodium chloride, sodium chloride, acetaminophen **OR** acetaminophen, alteplase, heparin, ondansetron **OR** ondansetron (ZOFRAN) IV    Anti-infectives (From admission, onward)   Start     Dose/Rate Route Frequency Ordered Stop   03/17/20 1145  ceFAZolin (ANCEF) IVPB 2g/100 mL premix        2 g 200 mL/hr over 30 Minutes Intravenous On call to O.R. 03/17/20 1133 03/17/20 1239        Objective:   Vitals:   03/17/20 2100 03/17/20 2130 03/17/20 2200 03/18/20 0500  BP: (!) 100/53 (!) 97/58 (!) 99/56   Pulse: 94 97 97   Resp:   16   Temp:   (!) 97.5 F (36.4 C)   TempSrc:   Oral   SpO2:   96%   Weight:    60.3 kg  Height:        Wt Readings from Last 3 Encounters:  03/18/20 60.3 kg  02/25/20 61.5 kg  02/17/20 78 kg     Intake/Output Summary (Last 24 hours) at 03/18/2020 1433  Last data filed at 03/17/2020 2300 Gross per 24 hour  Intake 2363.63 ml  Output -500 ml  Net 2863.63 ml     Physical Exam  Gen:- Awake Alert,  In no apparent distress  HEENT:- [place].AT, No sclera icterus Neck-Supple Neck, right IJ HD catheter Lungs-  CTAB , fair symmetrical air movement CV- S1, S2 normal, regular  Abd-  +ve B.Sounds, Abd Soft, No tenderness, PEG tube site is clean dry and intact Extremity/Skin:- No  edema, pedal pulses present  Psych-affect is appropriate, oriented x3 Neuro-generalized weakness, no new focal deficits, no tremors MSK-nonfunctional left forearm AV fistula   Data Review:   Micro Results No results found for this or any previous visit (from the past 240 hour(s)).  Radiology Reports DG Chest Portable 1 View  Result Date: 03/15/2020 CLINICAL DATA:  Difficulty swallowing for 2 days. The patient tested positive for COVID-19 02/22/2020. EXAM: PORTABLE CHEST 1 VIEW COMPARISON:  Single-view of the chest 02/23/2020. PA and lateral chest 12/05/2016. FINDINGS: The lungs are clear. There is  cardiomegaly and aortic atherosclerosis. A few calcified pleural plaques are noted. Right IJ approach dialysis catheter is in place and the patient is status post right shoulder replacement. IMPRESSION: No acute disease. Cardiomegaly. Aortic Atherosclerosis (ICD10-I70.0). Electronically Signed   By: Inge Rise M.D.   On: 03/15/2020 14:58   DG Chest Port 1 View  Result Date: 02/23/2020 CLINICAL DATA:  Cough and inability to swallow.  COVID-19 positive. EXAM: PORTABLE CHEST 1 VIEW COMPARISON:  07/10/2018 FINDINGS: A right jugular dialysis catheter remains in place terminating over the high right atrium. The cardiac silhouette remains mildly enlarged. Aortic atherosclerosis is noted. The lungs are hyperinflated. Mildly accentuated interstitial markings bilaterally are similar to the prior study. No confluent airspace opacity, edema, sizable pleural effusion, or pneumothorax is identified. Calcified pleural plaques are noted. Oral contrast material is partially visualized in the colon. A right shoulder arthroplasty is noted. IMPRESSION: Chronic findings without evidence of acute cardiopulmonary disease. Electronically Signed   By: Logan Bores M.D.   On: 02/23/2020 08:11     CBC Recent Labs  Lab 03/15/20 1700 03/17/20 0650 03/18/20 0944  WBC 3.2* 3.6* 4.4  HGB 8.0* 7.8* 6.4*  HCT 26.8* 26.5* 21.7*  PLT 146* 155 111*  MCV 91.8 92.0 92.7  MCH 27.4 27.1 27.4  MCHC 29.9* 29.4* 29.5*  RDW 15.7* 16.0* 16.1*    Chemistries  Recent Labs  Lab 03/15/20 1700 03/16/20 0412 03/17/20 0650 03/18/20 0944  NA 137 141 144 143  K 3.1* 3.4* 4.8 4.1  CL 99 104 112* 108  CO2 23 21* 22 25  GLUCOSE 64* 106* 143* 120*  BUN '21 21 22 ' 27*  CREATININE 5.90* 6.15* 7.10* 5.54*  CALCIUM 8.8* 9.0 9.0 8.9  MG 1.9  --   --   --   AST 13*  --   --   --   ALT 7  --   --   --   ALKPHOS 42  --   --   --   BILITOT 1.2  --   --   --     ------------------------------------------------------------------------------------------------------------------ No results for input(s): CHOL, HDL, LDLCALC, TRIG, CHOLHDL, LDLDIRECT in the last 72 hours.  Lab Results  Component Value Date   HGBA1C 5.1 02/23/2020   ------------------------------------------------------------------------------------------------------------------ No results for input(s): TSH, T4TOTAL, T3FREE, THYROIDAB in the last 72 hours.  Invalid input(s): FREET3 ------------------------------------------------------------------------------------------------------------------ Recent Labs    03/15/20 1637  FERRITIN 703*  TIBC 147*  IRON 63    Coagulation profile No results for input(s): INR, PROTIME in the last 168 hours.  No results for input(s): DDIMER in the last 72 hours.  Cardiac Enzymes No results for input(s): CKMB, TROPONINI, MYOGLOBIN in the last 168 hours.  Invalid input(s): CK ------------------------------------------------------------------------------------------------------------------    Component Value Date/Time   BNP 423.0 (H) 12/27/2016 1510     Roxan Hockey M.D on 03/18/2020 at 2:33 PM  Go to [email or web address] - for contact info  [facility] - Office  [phone]

## 2020-03-18 NOTE — Progress Notes (Signed)
CRITICAL VALUE ALERT  Critical Value:  H 6.4  Date & Time Notied:  7846  Provider Notified: [id number]  Orders Received/Actions taken: yes

## 2020-03-18 NOTE — Progress Notes (Signed)
[facility] Progress Note   69 y.o.malewith a past medical history notable for MGUS/myeloma HTN afib ESRD on HDadmitted with dysphagia and PEG tube placement  Assessment/ Plan:   # ESRD:Continueoutpt prescriptionand schedule [facility] MWF, last dialysis 8/14 with 500 ml given net +: 3.25hrs, 2K, 2.5Ca, F180 in place of gambro revaclear 300,  heparin held given likely procedure.   - Next HD on Monday; no acute indication  # Volume/ hypotension: EDW 82kg.  - is that right? Weights in the hospital in the 60kg range; severe weight loss if correct EDW. He states that he has lost a lot of weight.  # Anemia of Chronic Kidney Disease:Hemoglobin 8. Currently receiving no ESAs due to myeloma.  Iron saturation ferritin elevated (43 and 703 respectively).  Will not give IV iron with the malignancy.   # Secondary Hyperparathyroidism/Hyperphosphatemia:On hecterol 0.62mcg MWF and sensipar 30mg  nightly. HoldingAuryxia given not eating.Restart Sensipar 30 mg nightly once PEG tube placed  # Vascular access:Recently placed AVG 02/17/20 (good bruit), loose silk sutures along outflow bec of steal.  Use right tunneled CVC for now  # Hospital problem:Dysphagia: PEG tube placement per primary team.  Hopefully today  hypertension: Chronic problem.  Asymptomatic.  Midodrine as tolerated  # Additional recommendations: - Dose all meds for creatinine clearance <10 ml/min  - Unless absolutely necessary, no MRIs with gadolinium.  - Implement save arm precautions. Prefer needle sticks in the dorsum of the hands or wrists. No blood pressure measurements in arm. - If blood transfusion is requested during hemodialysis sessions, please alert [place] prior to the session.   Subjective:   Nausea better; denies f/c/ myalgias. Feels that diarrhea started after surgery.   Objective:   BP (!) 99/56   Pulse 97   Temp (!) 97.5 F (36.4 C) (Oral)   Resp 16   Ht 6\' 3"  (1.905 m)   Wt 60.3  kg   SpO2 96%   BMI 16.62 kg/m   Intake/Output Summary (Last 24 hours) at 03/18/2020 1439 Last data filed at 03/17/2020 2300 Gross per 24 hour  Intake 2363.63 ml  Output -500 ml  Net 2863.63 ml   Weight change: 0 kg  Physical Exam: Constitutional: Chronically ill-appearing, frail, thin, no significant distress CV: normal rate, no edema Respiratory: Bilateral chest rise, normal work of breathing Gastrointestinal: soft, non-tender, no palpable masses or hernias Skin: no visible lesions or rashes Access: RUA AVG +bruit   Imaging: No results found.  Labs: BMET Recent Labs  Lab 03/15/20 1700 03/16/20 0412 03/17/20 0650 03/18/20 0944  NA 137 141 144 143  K 3.1* 3.4* 4.8 4.1  CL 99 104 112* 108  CO2 23 21* 22 25  GLUCOSE 64* 106* 143* 120*  BUN 21 21 22  27*  CREATININE 5.90* 6.15* 7.10* 5.54*  CALCIUM 8.8* 9.0 9.0 8.9  PHOS  --   --   --  2.3*   CBC Recent Labs  Lab 03/15/20 1700 03/17/20 0650 03/18/20 0944  WBC 3.2* 3.6* 4.4  HGB 8.0* 7.8* 6.4*  HCT 26.8* 26.5* 21.7*  MCV 91.8 92.0 92.7  PLT 146* 155 111*    Medications:    . sodium chloride   Intravenous Once  . Chlorhexidine Gluconate Cloth  6 each Topical Q0600  . metoCLOPramide  5 mg Oral BID  . midodrine  10 mg Oral TID WC  . pantoprazole (PROTONIX) IV  40 mg Intravenous Q12H      Otelia Santee, MD 03/18/2020, 2:39 PM

## 2020-03-19 LAB — TYPE AND SCREEN
ABO/RH(D): O POS
Antibody Screen: NEGATIVE
Unit division: 0
Unit division: 0

## 2020-03-19 LAB — GLUCOSE, CAPILLARY
Glucose-Capillary: 113 mg/dL — ABNORMAL HIGH (ref 70–99)
Glucose-Capillary: 111 mg/dL — ABNORMAL HIGH (ref 70–99)
Glucose-Capillary: 125 mg/dL — ABNORMAL HIGH (ref 70–99)
Glucose-Capillary: 114 mg/dL — ABNORMAL HIGH (ref 70–99)
Glucose-Capillary: 113 mg/dL — ABNORMAL HIGH (ref 70–99)

## 2020-03-19 LAB — BPAM RBC
Blood Product Expiration Date: 202109042359
Blood Product Expiration Date: 202109102359
ISSUE DATE / TIME: 202108142012
Unit Type and Rh: 5100
Unit Type and Rh: 5100

## 2020-03-19 LAB — CBC
HCT: 25 % — ABNORMAL LOW (ref 39.0–52.0)
Hemoglobin: 7.8 g/dL — ABNORMAL LOW (ref 13.0–17.0)
MCH: 27 pg (ref 26.0–34.0)
MCHC: 31.2 g/dL (ref 30.0–36.0)
MCV: 86.5 fL (ref 80.0–100.0)
Platelets: 97 10*3/uL — ABNORMAL LOW (ref 150–400)
RBC: 2.89 MIL/uL — ABNORMAL LOW (ref 4.22–5.81)
RDW: 16.5 % — ABNORMAL HIGH (ref 11.5–15.5)
WBC: 5.1 10*3/uL (ref 4.0–10.5)
nRBC: 0 % (ref 0.0–0.2)

## 2020-03-19 LAB — BASIC METABOLIC PANEL
Anion gap: 10 (ref 5–15)
BUN: 46 mg/dL — ABNORMAL HIGH (ref 8–23)
CO2: 22 mmol/L (ref 22–32)
Calcium: 9.2 mg/dL (ref 8.9–10.3)
Chloride: 107 mmol/L (ref 98–111)
Creatinine, Ser: 6.18 mg/dL — ABNORMAL HIGH (ref 0.61–1.24)
GFR calc Af Amer: 10 mL/min — ABNORMAL LOW (ref >60)
GFR calc non Af Amer: 9 mL/min — ABNORMAL LOW (ref >60)
Glucose, Bld: 115 mg/dL — ABNORMAL HIGH (ref 70–99)
Potassium: 4.1 mmol/L (ref 3.5–5.1)
Sodium: 139 mmol/L (ref 135–145)

## 2020-03-19 MED ORDER — CINACALCET HCL 30 MG PO TABS
30.0000 mg | ORAL_TABLET | Freq: Every day | ORAL | Status: DC
  Administered 2020-03-19 – 2020-03-20 (×2): 30 mg via ORAL
  Filled 2020-03-19 (×2): qty 1

## 2020-03-19 NOTE — Progress Notes (Signed)
Patient Demographics:    Brian Liu, is a 69 y.o. male, DOB - 12-07-1950, SLH:[id number]  Admit date - 03/15/2020   Admitting Physician Brian Arlyce Dice, MD  Outpatient Primary MD for the patient is Brian Chroman, MD  LOS - 4   Chief Complaint  Patient presents with  . Dysphagia        Subjective:    Brian Liu today has no fevers, no emesis,  No chest pain,   -No vomiting , denies abdominal pain  -Loose stools persist -Trying to titrate PEG tube feeding up -  Assessment  & Plan :    Principal Problem:   Dysphagia/FTT Active Problems:   Hypertension   Mitral regurgitation   ESRD (end stage renal disease) (HCC)   Atrial fibrillation (HCC)   Multiple myeloma (HCC)   COVID-19 virus infection   MGUS (monoclonal gammopathy of unknown significance)   Acute blood loss as cause of postoperative anemia-- Superimposed on Chronic Anemia of CKD  Brief Summary:- [name] is a 69 y.o. male with medical history significant for MGUS/multiple myeloma, hypertension, ESRD, atrial fibrillation, pancytopenia, history of recent COVID-19 infection (02/22/20), status post prior Covid 19 vaccination history of presumed TIAs with negative MRI on 02/18/2020 admitted on 03/15/2020 with dysphagia and failure to thrive and needing PEG tube placement for nutritional purposes -Status post PEG tube placement on 03/17/2020  A/p 1)Dysphagia and Failure to Thrive--PTA patient was getting speech therapy Had modified barium swallow on 02/18/2020-suggesting oropharyngeal dysfunction without any structural esophageal problems despite prior history of radiation therapy -Recent EGD  was unremarkable S/p PEG placement on 03/17/2020 -Dietitian/nutritionist consult appreciated -Plan is for Osmolite 1.5 infusion with slow titration with water flushes once PEG tube is placed alternatively---  bolus feedings 355 ml (1.5 cans)  Osmolite 1.5 via PEG QID 25 ml free water flush before and after each feeding administration  -Hemoglobin is up to 7.8 from 6.4 after transfusion of 1 unit of PRBC on 03/18/2020 - baseline usually around 8  -Possible discharge home on 03/20/2020 with home health RN after hemodialysis if tolerating PEG tube feeding better.  2)Acute on chronic Anemia--- patient with chronic anemia of ESRD with baseline hemoglobin usually between 8 and 9 -Anemia is presumed to be due to acute blood loss in the setting of recent PEG tube placement with GI blood loss- -Discussed with general surgeon Dr. Arnoldo Liu and on-call GI physician Dr. Gala Liu -Hemoglobin is up to 7.8 from 6.4 after transfusion of 1 unit of PRBC on 03/18/2020  --Continue IV Protonix and monitor H&H closely -ESA/Procrit per nephrology team  3)Chronic Atrial fibrillation----appears rate is controlled without AV nodal blocking agents, -Not on anticoagulation due to high risk of bleeding--patient has history of chronic subdural hematomas as well  4)ESRD--- gets HD Monday Wednesdays and Fridays, missed HD session on 03/15/2020, nephrology consult for HD appreciated -Had HD on 03/17/2020  5)Hypothyroidism--- continue Synthroid  6)Chronic Hypotension--- continue midodrine, very judicious with IV fluids given ESRD status  7) history of prior left occipital lobe stroke and Chronic bilateral cerebral convexity subdural hematomas--- please see MRI from July 2021 --Supportive management  8)FEN--- Tube Feeding---PEG tube feeding as above #1 --I Dr. Roxan Liu anticipate that Brian Liu will need tube feeding  from more than 90 days due to significant dysphagia and failure to thrive  Disposition/Need for in-Hospital Stay- patient unable to be discharged at this time due to --- failure to thrive due to dysphagia resulting in hypoglycemia requiring  PEG tube placement -Acute blood loss/GI bleed requiring transfusion of PRBCs in the setting of  hemodynamic  concerns/soft BP --Possible discharge home on 03/20/2020 with home health RN after hemodialysis if tolerating PEG tube feeding better.  Status is: Inpatient  Remains inpatient appropriate because: failure to thrive due to dysphagia resulting in hypoglycemia requiring  PEG tube placement -Acute blood loss/GI bleed requiring transfusion of PRBCs in the setting of hemodynamic  concerns/soft BP   Disposition: The patient is from: Home              Anticipated d/c is to: Home              Anticipated d/c date is: 2 days              Patient currently is not medically stable to d/c. Barriers: Not Clinically Stable-  failure to thrive due to dysphagia resulting in hypoglycemia requiring  PEG tube placement -Acute blood loss/GI bleed requiring transfusion of PRBCs in the setting of hemodynamic  concerns/soft BP --Possible discharge home on 03/20/2020 with home health RN after hemodialysis if tolerating PEG tube feeding better  Code Status : full  Family Communication:  (patient is alert, awake and coherent)   Consults  :  Nephrology/Gen Surgery/  DVT Prophylaxis  :   SCDs   Lab Results  Component Value Date   PLT 97 (L) 03/19/2020    Inpatient Medications  Scheduled Meds: . sodium chloride   Intravenous Once  . Chlorhexidine Gluconate Cloth  6 each Topical Q0600  . cinacalcet  30 mg Oral Q supper  . midodrine  10 mg Oral TID WC  . pantoprazole (PROTONIX) IV  40 mg Intravenous Q12H   Continuous Infusions: . sodium chloride    . sodium chloride    . feeding supplement (OSMOLITE 1.5 CAL) 45 mL/hr at 03/19/20 0400   PRN Meds:.sodium chloride, sodium chloride, acetaminophen **OR** acetaminophen, alteplase, heparin, ondansetron **OR** ondansetron (ZOFRAN) IV    Anti-infectives (From admission, onward)   Start     Dose/Rate Route Frequency Ordered Stop   03/17/20 1145  ceFAZolin (ANCEF) IVPB 2g/100 mL premix        2 g 200 mL/hr over 30 Minutes Intravenous On call to  O.R. 03/17/20 1133 03/17/20 1239        Objective:   Vitals:   03/18/20 2330 03/19/20 0451 03/19/20 0500 03/19/20 1514  BP: 125/89 (!) 71/54 (!) 85/64 (!) 99/37  Pulse: 80 (!) 52 60 95  Resp: '18 16  18  ' Temp: 98.1 F (36.7 C) (!) 97.3 F (36.3 C) 97.8 F (36.6 C)   TempSrc: Axillary Axillary    SpO2: 95% 100%    Weight:   60.7 kg   Height:        Wt Readings from Last 3 Encounters:  03/19/20 60.7 kg  02/25/20 61.5 kg  02/17/20 78 kg     Intake/Output Summary (Last 24 hours) at 03/19/2020 1621 Last data filed at 03/18/2020 2330 Gross per 24 hour  Intake 317 ml  Output --  Net 317 ml     Physical Exam  Gen:- Awake Alert,  In no apparent distress  HEENT:- [place].AT, No sclera icterus Neck-Supple Neck, right IJ HD catheter Lungs-  CTAB , fair  symmetrical air movement CV- S1, S2 normal, regular  Abd-  +ve B.Sounds, Abd Soft, No tenderness, PEG tube site is clean dry and intact Extremity/Skin:- No  edema, pedal pulses present  Psych-affect is appropriate, oriented x3 Neuro-generalized weakness, no new focal deficits, no tremors MSK-nonfunctional left forearm AV fistula   Data Review:   Micro Results No results found for this or any previous visit (from the past 240 hour(s)).  Radiology Reports DG Chest Portable 1 View  Result Date: 03/15/2020 CLINICAL DATA:  Difficulty swallowing for 2 days. The patient tested positive for COVID-19 02/22/2020. EXAM: PORTABLE CHEST 1 VIEW COMPARISON:  Single-view of the chest 02/23/2020. PA and lateral chest 12/05/2016. FINDINGS: The lungs are clear. There is cardiomegaly and aortic atherosclerosis. A few calcified pleural plaques are noted. Right IJ approach dialysis catheter is in place and the patient is status post right shoulder replacement. IMPRESSION: No acute disease. Cardiomegaly. Aortic Atherosclerosis (ICD10-I70.0). Electronically Signed   By: Inge Rise M.D.   On: 03/15/2020 14:58   DG Chest Port 1 View  Result Date:  02/23/2020 CLINICAL DATA:  Cough and inability to swallow.  COVID-19 positive. EXAM: PORTABLE CHEST 1 VIEW COMPARISON:  07/10/2018 FINDINGS: A right jugular dialysis catheter remains in place terminating over the high right atrium. The cardiac silhouette remains mildly enlarged. Aortic atherosclerosis is noted. The lungs are hyperinflated. Mildly accentuated interstitial markings bilaterally are similar to the prior study. No confluent airspace opacity, edema, sizable pleural effusion, or pneumothorax is identified. Calcified pleural plaques are noted. Oral contrast material is partially visualized in the colon. A right shoulder arthroplasty is noted. IMPRESSION: Chronic findings without evidence of acute cardiopulmonary disease. Electronically Signed   By: Logan Bores M.D.   On: 02/23/2020 08:11    CBC Recent Labs  Lab 03/15/20 1700 03/17/20 0650 03/18/20 0944 03/19/20 0711  WBC 3.2* 3.6* 4.4 5.1  HGB 8.0* 7.8* 6.4* 7.8*  HCT 26.8* 26.5* 21.7* 25.0*  PLT 146* 155 111* 97*  MCV 91.8 92.0 92.7 86.5  MCH 27.4 27.1 27.4 27.0  MCHC 29.9* 29.4* 29.5* 31.2  RDW 15.7* 16.0* 16.1* 16.5*    Chemistries  Recent Labs  Lab 03/15/20 1700 03/16/20 0412 03/17/20 0650 03/18/20 0944 03/19/20 0711  NA 137 141 144 143 139  K 3.1* 3.4* 4.8 4.1 4.1  CL 99 104 112* 108 107  CO2 23 21* '22 25 22  ' GLUCOSE 64* 106* 143* 120* 115*  BUN '21 21 22 ' 27* 46*  CREATININE 5.90* 6.15* 7.10* 5.54* 6.18*  CALCIUM 8.8* 9.0 9.0 8.9 9.2  MG 1.9  --   --   --   --   AST 13*  --   --   --   --   ALT 7  --   --   --   --   ALKPHOS 42  --   --   --   --   BILITOT 1.2  --   --   --   --    ------------------------------------------------------------------------------------------------------------------ No results for input(s): CHOL, HDL, LDLCALC, TRIG, CHOLHDL, LDLDIRECT in the last 72 hours.  Lab Results  Component Value Date   HGBA1C 5.1 02/23/2020    ------------------------------------------------------------------------------------------------------------------ No results for input(s): TSH, T4TOTAL, T3FREE, THYROIDAB in the last 72 hours.  Invalid input(s): FREET3 ------------------------------------------------------------------------------------------------------------------ No results for input(s): VITAMINB12, FOLATE, FERRITIN, TIBC, IRON, RETICCTPCT in the last 72 hours.  Coagulation profile No results for input(s): INR, PROTIME in the last 168 hours.  No results for input(s):  DDIMER in the last 72 hours.  Cardiac Enzymes No results for input(s): CKMB, TROPONINI, MYOGLOBIN in the last 168 hours.  Invalid input(s): CK ------------------------------------------------------------------------------------------------------------------    Component Value Date/Time   BNP 423.0 (H) 12/27/2016 1510   Brian Liu M.D on 03/19/2020 at 4:21 PM  Go to [email or web address] - for contact info  [facility] - Office  [phone]

## 2020-03-19 NOTE — Progress Notes (Signed)
Patient received 1 unit of packed red blood cells.  Hemoglobin responded appropriately.  Nursing reports no blood present on gastric aspirate.  PEG site clean and dry.  No frank hematochezia noted.  Will continue to follow with you.

## 2020-03-19 NOTE — Progress Notes (Addendum)
[facility] Progress Note   69 y.o.malewith a past medical history notable for MGUS/myeloma HTN afib ESRD on HDadmitted with dysphagia and PEG tube placement  Assessment/ Plan:   # ESRD:Continueoutpt prescriptionand schedule [facility] MWF, last dialysis 8/14 with 500 ml given net +: 3.25hrs, 2K, 2.5Ca, F180 in place of gambro revaclear 300,  heparin held given likely procedure.   - Next HD on Monday; no acute indication. Start using the RUA straight AVG tomorrow (placed 02/17/20)  # Volume/ hypotension: EDW 82kg.  - is that right? Weights in the hospital in the 60kg range; severe weight loss if correct EDW. He states that he has lost a lot of weight. Will attempt 1-2 liters tomorrow as tolerated; not convinced we will be able to get to 2L.  # Anemia of Chronic Kidney Disease:Hemoglobin 8. Currently receiving no ESAs due to myeloma.Iron saturation ferritin elevated (43 and 703 respectively). Will not give IV iron with the malignancy.   # Secondary Hyperparathyroidism/Hyperphosphatemia:On hecterol 0.70mcg MWF and sensipar 30mg  nightly. HoldingAuryxia given not eating -> recheck phos mid week. .Will restart Sensipar 30 mg nightly  # Vascular access:Recently placed AVG 02/17/20  (loose silk sutures along outflow bec of steal). Good bruit in the straight AVG -> it's been a month since surgery. Will start using it tomorrow.  # Hospital problem:Dysphagia: PEG tube placement per primary team. Hopefully today hypertension: Chronic problem. Asymptomatic. Midodrine as tolerated  # Additional recommendations: - Dose all meds for creatinine clearance <10 ml/min  - Unless absolutely necessary, no MRIs with gadolinium.  - Implement save arm precautions. Prefer needle sticks in the dorsum of the hands or wrists. No blood pressure measurements in arm. - If blood transfusion is requested during hemodialysis sessions, please alert [place] prior to the  session.  Subjective:   Nausea better; denies f/c/ myalgias. Denies cough or SOB  Feels that diarrhea started after surgery.   Objective:   BP (!) 85/64   Pulse 60   Temp 97.8 F (36.6 C)   Resp 16   Ht 6\' 3"  (1.905 m)   Wt 60.7 kg   SpO2 100%   BMI 16.73 kg/m   Intake/Output Summary (Last 24 hours) at 03/19/2020 1317 Last data filed at 03/18/2020 2330 Gross per 24 hour  Intake 377 ml  Output 10 ml  Net 367 ml   Weight change: 0.5 kg  Physical Exam: Constitutional:Chronically ill-appearing, frail, thin, no significant distress CV: normal rate, no edema Respiratory:Bilateral chest rise, normal work of breathing Gastrointestinal: soft, non-tender, no palpable masses or hernias Skin: no visible lesions or rashes Access: RUA AVG +bruit  Imaging: No results found.  Labs: BMET Recent Labs  Lab 03/15/20 1700 03/16/20 0412 03/17/20 0650 03/18/20 0944 03/19/20 0711  NA 137 141 144 143 139  K 3.1* 3.4* 4.8 4.1 4.1  CL 99 104 112* 108 107  CO2 23 21* 22 25 22   GLUCOSE 64* 106* 143* 120* 115*  BUN 21 21 22  27* 46*  CREATININE 5.90* 6.15* 7.10* 5.54* 6.18*  CALCIUM 8.8* 9.0 9.0 8.9 9.2  PHOS  --   --   --  2.3*  --    CBC Recent Labs  Lab 03/15/20 1700 03/17/20 0650 03/18/20 0944 03/19/20 0711  WBC 3.2* 3.6* 4.4 5.1  HGB 8.0* 7.8* 6.4* 7.8*  HCT 26.8* 26.5* 21.7* 25.0*  MCV 91.8 92.0 92.7 86.5  PLT 146* 155 111* 97*    Medications:    . sodium chloride   Intravenous  Once  . Chlorhexidine Gluconate Cloth  6 each Topical Q0600  . midodrine  10 mg Oral TID WC  . pantoprazole (PROTONIX) IV  40 mg Intravenous Q12H      Otelia Santee, MD 03/19/2020, 1:17 PM

## 2020-03-20 ENCOUNTER — Encounter (HOSPITAL_COMMUNITY): Payer: Self-pay | Admitting: General Surgery

## 2020-03-20 LAB — CBC
HCT: 25.4 % — ABNORMAL LOW (ref 39.0–52.0)
Hemoglobin: 7.8 g/dL — ABNORMAL LOW (ref 13.0–17.0)
MCH: 27.4 pg (ref 26.0–34.0)
MCHC: 30.7 g/dL (ref 30.0–36.0)
MCV: 89.1 fL (ref 80.0–100.0)
Platelets: 96 10*3/uL — ABNORMAL LOW (ref 150–400)
RBC: 2.85 MIL/uL — ABNORMAL LOW (ref 4.22–5.81)
RDW: 16.9 % — ABNORMAL HIGH (ref 11.5–15.5)
WBC: 5.5 10*3/uL (ref 4.0–10.5)
nRBC: 0 % (ref 0.0–0.2)

## 2020-03-20 LAB — BASIC METABOLIC PANEL
Anion gap: 11 (ref 5–15)
BUN: 54 mg/dL — ABNORMAL HIGH (ref 8–23)
CO2: 22 mmol/L (ref 22–32)
Calcium: 9.3 mg/dL (ref 8.9–10.3)
Chloride: 108 mmol/L (ref 98–111)
Creatinine, Ser: 6.62 mg/dL — ABNORMAL HIGH (ref 0.61–1.24)
GFR calc Af Amer: 9 mL/min — ABNORMAL LOW (ref >60)
GFR calc non Af Amer: 8 mL/min — ABNORMAL LOW (ref >60)
Glucose, Bld: 137 mg/dL — ABNORMAL HIGH (ref 70–99)
Potassium: 4.6 mmol/L (ref 3.5–5.1)
Sodium: 141 mmol/L (ref 135–145)

## 2020-03-20 LAB — GLUCOSE, CAPILLARY
Glucose-Capillary: 126 mg/dL — ABNORMAL HIGH (ref 70–99)
Glucose-Capillary: 115 mg/dL — ABNORMAL HIGH (ref 70–99)
Glucose-Capillary: 126 mg/dL — ABNORMAL HIGH (ref 70–99)
Glucose-Capillary: 123 mg/dL — ABNORMAL HIGH (ref 70–99)
Glucose-Capillary: 111 mg/dL — ABNORMAL HIGH (ref 70–99)
Glucose-Capillary: 110 mg/dL — ABNORMAL HIGH (ref 70–99)

## 2020-03-20 NOTE — NC FL2 (Signed)
[place] LEVEL OF CARE SCREENING TOOL     IDENTIFICATION  Patient Name: Brian Liu Birthdate: [birth year] Sex: male Admission Date (Current Location): 03/15/2020  [facility] and [place] Number:  [employer] and Address:  [place] [address], [place]      Provider Number: [id number]  Attending Physician Name and Address:  Roxan Hockey, MD  Relative Name and Phone Number:       Current Level of Care: Hospital Recommended Level of Care: [place] Prior Approval Number:    Date Approved/Denied:   PASRR Number: [id number]  Discharge Plan: SNF    Current Diagnoses: Patient Active Problem List   Diagnosis Date Noted  . Acute blood loss as cause of postoperative anemia-- Superimposed on Chronic Anemia of CKD 03/18/2020  . COVID-19 virus infection 02/24/2020  . Dysphagia/FTT 02/23/2020  . Primary osteoarthritis, left shoulder 06/18/2018  . Venous stenosis of left upper extremity 04/03/2017  . Elevated troponin   . Dehydration 12/24/2016  . ESRD on hemodialysis ([place]) 12/24/2016  . Nausea vomiting and diarrhea 12/24/2016  . Enteritis 12/04/2016  . Thrush 12/04/2016  . Pancreatitis 12/02/2016  . Hypokalemia 12/02/2016  . Anemia 12/02/2016  . Abdominal pain   . Multiple myeloma ([place]) 04/30/2016  . 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 Complication of dialysis access insertion 05/17/2014  . ESRD (end stage renal disease) ([place])   . Atrial fibrillation ([place])   . Hypertension   . Pulmonary hypertension ([place])   . Mitral regurgitation   . Tobacco abuse   . Dyslipidemia   . Lower gastrointestinal bleed   . Pancytopenia ([place])   . Ejection fraction   . Diastolic dysfunction   . LVH (left ventricular hypertrophy)     Orientation RESPIRATION BLADDER Height & Weight     Self, Time, Situation, Place  Normal Continent Weight: 133 lb  13.1 oz (60.7 kg) Height:  '6\' 3"'  (190.5 cm)  BEHAVIORAL SYMPTOMS/MOOD NEUROLOGICAL BOWEL NUTRITION STATUS      Continent Feeding tube  AMBULATORY STATUS COMMUNICATION OF NEEDS Skin   Extensive Assist Verbally Other (Comment) (incision to abdomen with gauze)                       Personal Care Assistance Level of Assistance  Bathing, Feeding, Dressing Bathing Assistance: Maximum assistance Feeding assistance: Limited assistance Dressing Assistance: Maximum assistance     Functional Limitations Info  Sight, Hearing, Speech Sight Info: Impaired Hearing Info: Adequate Speech Info: Adequate    SPECIAL CARE FACTORS FREQUENCY  PT (By licensed PT), OT (By licensed OT)     PT Frequency: daily              Contractures      Additional Factors Info  Code Status, Allergies Code Status Info: Full code Allergies Info: Hydrocodone           Current Medications (03/20/2020):  This is the current hospital active medication list Current Facility-Administered Medications  Medication Dose Route Frequency Provider Last Rate Last Admin  . 0.9 %  sodium chloride infusion (Manually program via Guardrails IV Fluids)   Intravenous Once Emokpae, Courage, MD      . 0.9 %  sodium chloride infusion  100 mL Intravenous PRN Reesa Chew, MD      . 0.9 %  sodium chloride infusion  100 mL Intravenous PRN Joylene Grapes Shaune Pollack, MD      .  acetaminophen (TYLENOL) tablet 650 mg  650 mg Oral Q6H PRN Aviva Signs, MD   650 mg at 03/20/20 1046   Or  . acetaminophen (TYLENOL) suppository 650 mg  650 mg Rectal Q6H PRN Aviva Signs, MD      . alteplase (CATHFLO ACTIVASE) injection 2 mg  2 mg Intracatheter Once PRN Reesa Chew, MD      . Chlorhexidine Gluconate Cloth 2 % PADS 6 each  6 each Topical Q0600 Aviva Signs, MD   6 each at 03/20/20 [phone]  . cinacalcet (SENSIPAR) tablet 30 mg  30 mg Oral Q supper Dwana Melena, MD   30 mg at 03/19/20 1647  . feeding supplement (OSMOLITE 1.5 CAL) liquid  1,000 mL  1,000 mL Per Tube Continuous Aviva Signs, MD 65 mL/hr at 03/19/20 2200 1,000 mL at 03/19/20 2200  . heparin injection 1,000 Units  1,000 Units Dialysis PRN Reesa Chew, MD      . midodrine (PROAMATINE) tablet 10 mg  10 mg Oral TID WC Aviva Signs, MD   10 mg at 03/20/20 1046  . ondansetron (ZOFRAN) tablet 4 mg  4 mg Oral Q6H PRN Aviva Signs, MD       Or  . ondansetron [facility]) injection 4 mg  4 mg Intravenous Q6H PRN Aviva Signs, MD   4 mg at 03/19/20 2109  . pantoprazole (PROTONIX) injection 40 mg  40 mg Intravenous Q12H Roxan Hockey, MD   40 mg at 03/20/20 3254     Discharge Medications: Please see discharge summary for a list of discharge medications.  Relevant Imaging Results:  Relevant Lab Results:   Additional Information Dialysis MWF 1st shift [facility]. SSN: [id number]. Pt has had both COVID vaccines per son. New feeding tube.  Salome Arnt, LCSW

## 2020-03-20 NOTE — Plan of Care (Signed)
  Problem: Acute Rehab PT Goals(only PT should resolve) Goal: Pt Will Go Supine/Side To Sit Outcome: Progressing Flowsheets (Taken 03/20/2020 1221) Pt will go Supine/Side to Sit: with minimal assist Goal: Pt Will Go Sit To Supine/Side Outcome: Progressing Flowsheets (Taken 03/20/2020 1221) Pt will go Sit to Supine/Side: with minimal assist Goal: Patient Will Transfer Sit To/From Stand Outcome: Progressing Flowsheets (Taken 03/20/2020 1221) Patient will transfer sit to/from stand: with moderate assist Goal: Pt Will Transfer Bed To Chair/Chair To Bed Outcome: Progressing Flowsheets (Taken 03/20/2020 1221) Pt will Transfer Bed to Chair/Chair to Bed: with mod assist Goal: Pt Will Ambulate Outcome: Progressing Flowsheets (Taken 03/20/2020 1221) Pt will Ambulate:  25 feet  with moderate assist  with least restrictive assistive device  12:22 PM, 03/20/20 Mearl Latin PT, DPT Physical Therapist at [facility]

## 2020-03-20 NOTE — Progress Notes (Signed)
  [facility] Progress Note   69 y.o.malewith a past medical history notable for MGUS/myeloma HTN afib ESRD on HDadmitted with dysphagia and PEG tube placement  Assessment/ Plan:   # ESRD:Continueoutpt prescriptionand schedule [facility] MWF, last dialysis 8/14 with 500 ml given net +: 3.25hrs, 2K, 2.5Ca, F180 in place of gambro revaclear 300,  heparin held given likely procedure.   - Next HD on today on schedule: Start using the RUA straight AVG (placed 02/17/20)  # Volume/ hypotension: EDW 82kg as outpt.  Incorrect, cont gentle UF as tolerated  # Anemia of Chronic Kidney Disease:Hemoglobin around 8. Currently receiving no ESAs due to myeloma.Iron saturation ferritin elevated (43 and 703 respectively). CTM  # Secondary Hyperparathyroidism/Hyperphosphatemia:On hecterol 0.58mcg MWF and sensipar 30mg  nightly. HoldingAuryxia given not eating -> recheck phos later this week. .Cont Sensipar 30 mg nightly  # Vascular access:Recently placed AVG 02/17/20  (loose silk sutures along outflow bec of steal). Good bruit in the straight AVG -> it's been a month since surgery. Will start using it today  # Hospital problem:Dysphagia: s/p PEG tube placement per primary team.   hypotension: Chronic problem. Asymptomatic. Midodrine as tolerated   Subjective:     Freq BMs with TFs  Frustrated about status  NAEON   Objective:   BP 112/78 (BP Location: Right Leg)   Pulse 89   Temp (!) 97 F (36.1 C)   Resp 17   Ht 6\' 3"  (1.905 m)   Wt 60.7 kg   SpO2 92%   BMI 16.73 kg/m   Intake/Output Summary (Last 24 hours) at 03/20/2020 0834 Last data filed at 03/20/2020 0300 Gross per 24 hour  Intake 915 ml  Output --  Net 915 ml   Weight change:   Physical Exam: Constitutional:Chronically ill-appearing, frail, thin, no significant distress CV: normal rate, no edema, no rub Respiratory:Bilateral chest rise, normal work of breathing Gastrointestinal: soft,  non-tender, no palpable masses or hernias Skin: no visible lesions or rashes Access: RUA AVG +bruit  Imaging: No results found.  Labs: BMET Recent Labs  Lab 03/15/20 1700 03/16/20 0412 03/17/20 0650 03/18/20 0944 03/19/20 0711 03/20/20 0603  NA 137 141 144 143 139 141  K 3.1* 3.4* 4.8 4.1 4.1 4.6  CL 99 104 112* 108 107 108  CO2 23 21* 22 25 22 22   GLUCOSE 64* 106* 143* 120* 115* 137*  BUN 21 21 22  27* 46* 54*  CREATININE 5.90* 6.15* 7.10* 5.54* 6.18* 6.62*  CALCIUM 8.8* 9.0 9.0 8.9 9.2 9.3  PHOS  --   --   --  2.3*  --   --    CBC Recent Labs  Lab 03/17/20 0650 03/18/20 0944 03/19/20 0711 03/20/20 0603  WBC 3.6* 4.4 5.1 5.5  HGB 7.8* 6.4* 7.8* 7.8*  HCT 26.5* 21.7* 25.0* 25.4*  MCV 92.0 92.7 86.5 89.1  PLT 155 111* 97* 96*    Medications:    . sodium chloride   Intravenous Once  . Chlorhexidine Gluconate Cloth  6 each Topical Q0600  . cinacalcet  30 mg Oral Q supper  . midodrine  10 mg Oral TID WC  . pantoprazole (PROTONIX) IV  40 mg Intravenous Q12H      Rexene Agent , MD 03/20/2020, 8:34 AM

## 2020-03-20 NOTE — TOC Progression Note (Signed)
Transition of Care [facility]) - Progression Note    Patient Details  Name: Brian Liu MRN: [id number] Date of Birth: [birth year]  Transition of Care [facility]) CM/SW Contact  Salome Arnt,  Phone Number: 03/20/2020, 12:11 PM  Clinical Narrative:  Pt's son feels that pt will need SNF. PT evaluated pt and recommend SNF. LCSW discussed with pt's son who states pt is also in agreement. They request [facility] (pt has been before) or [facility]. He goes to [facility] MWF 1st shift. TOC will follow.     Expected Discharge Plan: [place] Barriers to Discharge: Continued Medical Work up  Expected Discharge Plan and Services Expected Discharge Plan: [place] In-house Referral: Clinical Social Work   Post Acute Care Choice: Resumption of Svcs/PTA Provider Living arrangements for the past 2 months: Single Family Home                 DME Arranged: Tube feeding DME Agency: Other - Comment ([facility]) Date DME Agency Contacted: 03/16/20   Representative spoke with at DME Agency: [place]: RN [place] Agency: [facility] Date [place]: 03/17/20   Representative spoke with at [place]: [place] ([place]) Interventions    Readmission Risk Interventions Readmission Risk Prevention Plan 03/16/2020  Transportation Screening Complete  Home Care Screening Complete  Medication Review (RN CM) Complete  Some recent data might be hidden

## 2020-03-20 NOTE — Evaluation (Signed)
Physical Therapy Evaluation Patient Details Name: Brian Liu MRN: [id number] DOB: 1951-06-08 Today's Date: 03/20/2020   History of Present Illness  Brian Liu is a 69 y.o. male with medical history significant for MGUS/multiple myeloma, hypertension, ESRD, atrial fibrillation, pancytopenia.Patient was brought to the ED reports that patient has not been able to swallow for the past 2 days.  Patient was recently admitted 7/20 -  7/24 for dysphagia thought to be of oropharyngeal etiology.  Speech therapy evaluated patient and recommended a dysphagia 1 diet with honey thick liquids.  Recommendations was that patient would need a PEG, which patient has been considering for the near future.  Prior to that hospitalization, Patient was seen at [facility] on 02/16/2021 for expressive aphasia and left-sided weakness.  At that time he was evaluated by neurology but found not to be a TPA candidate because he just had vascular procedure getting dialysis fistula earlier that day.  Patient symptoms improved.  He ended up being admitted to [facility] facility in [place] for stroke work-up.  Stroke work-up was negative.  He had an EGD done which was unremarkable.    Clinical Impression  Patient limited for functional mobility as stated below secondary to BLE weakness, fatigue and poor activity tolerance. Patient requires mod assist with bed mobility for LE movement and uprighting trunk with HOB elevated. Patient demonstrates good sitting tolerance EOB today and initially requires UE support for balance. Patient fatigued with scooting to EOB and states he is feeling to weak today to transfer to standing. Patient assisted back to supine and required assist for scooting up in bed. Patient limited by fatigue today.  Patient will benefit from continued physical therapy in hospital and recommended venue below to increase strength, balance, endurance for safe ADLs and gait.     Follow Up Recommendations SNF    Equipment  Recommendations  None recommended by PT    Recommendations for Other Services       Precautions / Restrictions Precautions Precautions: Fall Restrictions Weight Bearing Restrictions: No      Mobility  Bed Mobility Overal bed mobility: Needs Assistance Bed Mobility: Supine to Sit;Sit to Supine     Supine to sit: HOB elevated;Mod assist Sit to supine: Mod assist   General bed mobility comments: slow, labored transition to seated EOB, assist for LE movement and uprighting trunk with HOB elevated  Transfers                    Ambulation/Gait                Stairs            Wheelchair Mobility    Modified Rankin (Stroke Patients Only)       Balance Overall balance assessment: Needs assistance Sitting-balance support: Feet supported;Bilateral upper extremity supported Sitting balance-Leahy Scale: Fair Sitting balance - Comments: fair/ good seated EOB                                     Pertinent Vitals/Pain Pain Assessment: Faces Faces Pain Scale: Hurts little more Pain Location: L side Pain Intervention(s): Limited activity within patient's tolerance;Monitored during session;Repositioned    Home Living Family/patient expects to be discharged to:: Private residence Living Arrangements: Children Available Help at Discharge: Family;Available PRN/intermittently Type of Home: House Home Access: Stairs to enter Entrance Stairs-Rails: None Entrance Stairs-Number of Steps: 1 Home Layout: One level Home Equipment:  Walker - 2 wheels      Prior Function Level of Independence: Needs assistance   Gait / Transfers Assistance Needed: Household ambulator with RW  ADL's / Homemaking Assistance Needed: son assists        Hand Dominance        Extremity/Trunk Assessment   Upper Extremity Assessment Upper Extremity Assessment: Generalized weakness    Lower Extremity Assessment Lower Extremity Assessment: Generalized  weakness    Cervical / Trunk Assessment Cervical / Trunk Assessment: Normal  Communication   Communication: No difficulties  Cognition Arousal/Alertness: Awake/alert Behavior During Therapy: WFL for tasks assessed/performed Overall Cognitive Status: Within Functional Limits for tasks assessed                                        General Comments      Exercises     Assessment/Plan    PT Assessment Patient needs continued PT services  PT Problem List Decreased strength;Decreased mobility;Decreased activity tolerance;Decreased balance;Pain       PT Treatment Interventions DME instruction;Therapeutic exercise;Gait training;Balance training;Stair training;Neuromuscular re-education;Functional mobility training;Therapeutic activities;Patient/family education    PT Goals (Current goals can be found in the Care Plan section)  Acute Rehab PT Goals Patient Stated Goal: Return home PT Goal Formulation: With patient Time For Goal Achievement: April 19, 2020 Potential to Achieve Goals: Fair    Frequency Min 3X/week   Barriers to discharge        Co-evaluation               AM-PAC PT "6 Clicks" Mobility  Outcome Measure Help needed turning from your back to your side while in a flat bed without using bedrails?: None Help needed moving from lying on your back to sitting on the side of a flat bed without using bedrails?: A Little Help needed moving to and from a bed to a chair (including a wheelchair)?: A Lot Help needed standing up from a chair using your arms (e.g., wheelchair or bedside chair)?: A Lot Help needed to walk in hospital room?: A Lot Help needed climbing 3-5 steps with a railing? : Total 6 Click Score: 14    End of Session   Activity Tolerance: Patient limited by fatigue Patient left: in bed;with call bell/phone within reach;with bed alarm set Nurse Communication: Mobility status PT Visit Diagnosis: Unsteadiness on feet (R26.81);Other  abnormalities of gait and mobility (R26.89);Muscle weakness (generalized) (M62.81)    Time: 4888-9169 PT Time Calculation (min) (ACUTE ONLY): 13 min   Charges:   PT Evaluation $PT Eval Moderate Complexity: 1 Mod PT Treatments $Therapeutic Activity: 8-22 mins        12:19 PM, 03/20/20 Mearl Latin PT, DPT Physical Therapist at [facility]

## 2020-03-20 NOTE — TOC Progression Note (Addendum)
Transition of Care [facility]) - Progression Note    Patient Details  Name: LENNIX KNEISEL MRN: [id number] Date of Birth: [birth year]  Transition of Care [facility]) CM/SW Contact  Salome Arnt, [place] Phone Number: 03/20/2020, 12:54 PM  Clinical Narrative:  Pt and son accept bed at [facility]. [name] at facility notified. [name] is aware of dialysis at [facility] MWF 1st shift. TOC will continue to follow.      Expected Discharge Plan: [place] Barriers to Discharge: Continued Medical Work up  Expected Discharge Plan and Services Expected Discharge Plan: [place] In-house Referral: Clinical Social Work   Post Acute Care Choice: Resumption of Svcs/PTA Provider Living arrangements for the past 2 months: Single Family Home                 DME Arranged: Tube feeding DME Agency: Other - Comment ([facility]) Date DME Agency Contacted: 03/16/20   Representative spoke with at DME Agency: [place]: RN [place] Agency: [facility] Date [place]: 03/17/20   Representative spoke with at [place]: [place] ([place]) Interventions    Readmission Risk Interventions Readmission Risk Prevention Plan 03/16/2020  Transportation Screening Complete  Home Care Screening Complete  Medication Review (RN CM) Complete  Some recent data might be hidden

## 2020-03-20 NOTE — Progress Notes (Signed)
Patient Demographics:    Brian Liu, is a 69 y.o. male, DOB - 24-Jan-1951, AYO:[id number]  Admit date - 03/15/2020   Admitting Physician Ejiroghene Arlyce Dice, MD  Outpatient Primary MD for the patient is [name], MD  LOS - 5  Chief Complaint  Patient presents with  . Dysphagia        Subjective:    Brian Liu today has no fevers, no emesis,  No chest pain,   -No vomiting , denies abdominal pain -Tolerating PEG tube feeding better, no further concerns about ongoing GI blood loss at this time, --Patient remains very weak and very deconditioned -  Assessment  & Plan :    Principal Problem:   Dysphagia/FTT Active Problems:   Hypertension   Mitral regurgitation   ESRD (end stage renal disease) (HCC)   Atrial fibrillation (HCC)   Multiple myeloma ([place])   COVID-19 virus infection   MGUS (monoclonal gammopathy of unknown significance)   Acute blood loss as cause of postoperative anemia-- Superimposed on Chronic Anemia of CKD  Brief Summary:- Brian Liu is a 69 y.o. male with medical history significant for MGUS/multiple myeloma, hypertension, ESRD, atrial fibrillation, pancytopenia, history of recent COVID-19 infection (02/22/20), status post prior Covid 19 vaccination history of presumed TIAs with negative MRI on 02/18/2020 admitted on 03/15/2020 with dysphagia and failure to thrive and needing PEG tube placement for nutritional purposes -Status post PEG tube placement on 03/17/2020  A/p 1)Dysphagia and Failure to Thrive--PTA patient was getting speech therapy Had modified barium swallow on 02/18/2020-suggesting oropharyngeal dysfunction without any structural esophageal problems despite prior history of radiation therapy -Recent EGD  was unremarkable S/p PEG placement on 03/17/2020 -Dietitian/nutritionist consult appreciated -Plan is for Osmolite 1.5 infusion with slow titration with water  flushes once PEG tube is placed alternatively---  bolus feedings 355 ml (1.5 cans) Osmolite 1.5 via PEG QID 25 ml free water flush before and after each feeding administration  -Hemoglobin is up to 7.8 from 6.4 after transfusion of 1 unit of PRBC on 03/18/2020 - baseline usually around 8  -Possible discharge to SNF Vs Home with home health RN  if tolerating PEG tube feeding better.  2)Acute on chronic Anemia--- patient with chronic anemia of ESRD with baseline hemoglobin usually between 8 and 9 -Anemia is presumed to be due to acute blood loss in the setting of recent PEG tube placement with GI blood loss- -Discussed with general surgeon Dr. Arnoldo Morale and on-call GI physician Dr. Gala Romney -Hemoglobin is up to 7.8 from 6.4 after transfusion of 1 unit of PRBC on 03/18/2020  --Continue  Protonix and monitor H&H closely -ESA/Procrit per nephrology team  3)Chronic Atrial fibrillation----appears rate is controlled without AV nodal blocking agents, -Not on anticoagulation due to high risk of bleeding--patient has history of chronic subdural hematomas as well  4)ESRD--- gets HD Monday Wednesdays and Fridays, missed HD session on 03/15/2020, nephrology consult for HD appreciated -Had HD on 03/17/2020 -Plan for HD on 03/20/2020 possibly using RUA straight AVG (placed 02/17/20), right and right IJ catheter  5)Hypothyroidism--- continue Synthroid  6)Chronic Hypotension--- continue midodrine, very judicious with IV fluids given ESRD status  7) history of prior left occipital lobe stroke and Chronic bilateral cerebral convexity subdural hematomas--- please see MRI  from July 2021 --Supportive management  8)FEN--- Tube Feeding---PEG tube feeding as above #1 --I Dr. Roxan Hockey anticipate that Brian Liu will need tube feeding from more than 90 days due to significant dysphagia and failure to thrive  9)Social/Ethics--plan of care discussed with patient and son, Son says he works 5 days a week  (shift work) and he (son) goes to hemodialysis center 3 times a week-----Pt is not able to manage tube feeding and other ADLs alone while son is out of the house---so Son request SNF --- Patient is a full code  Disposition/Need for in-Hospital Stay- patient unable to be discharged at this time due to --- failure to thrive due to dysphagia resulting in hypoglycemia requiring  PEG tube placement -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) and he (son) goes to hemodialysis center 3 times a week-----Pt is not able to manage tube feeding and other ADLs alone while son is out of the house---so Son request SNF  Status is: Inpatient  Remains inpatient appropriate because: failure to thrive due to dysphagia resulting in hypoglycemia requiring  PEG tube placement -Acute blood loss/GI bleed requiring transfusion of PRBCs in the setting of hemodynamic  concerns/soft BP   Disposition: The patient is from: Home              Anticipated d/c is to: Home              Anticipated d/c date is: 2 days              Patient currently is not medically stable to d/c. Barriers:   failure to thrive due to dysphagia resulting in hypoglycemia requiring  PEG tube placement -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) and he (son) goes to hemodialysis center 3 times a week-----Pt is not able to manage tube feeding and other ADLs alone while son is out of the house---so Son request SNF   Code Status : full  Family Communication:  (patient is alert, awake and coherent)   Consults  :  Nephrology/Gen Surgery/  DVT Prophylaxis  :   SCDs   Lab Results  Component Value Date   PLT 96 (L) 03/20/2020    Inpatient Medications  Scheduled Meds: . sodium chloride   Intravenous Once  . Chlorhexidine Gluconate Cloth  6 each Topical Q0600  . cinacalcet  30 mg Oral Q supper  . midodrine  10 mg Oral TID WC  . pantoprazole (PROTONIX) IV  40 mg Intravenous Q12H   Continuous Infusions: . sodium chloride    . sodium  chloride    . feeding supplement (OSMOLITE 1.5 CAL) 1,000 mL (03/19/20 2200)   PRN Meds:.sodium chloride, sodium chloride, acetaminophen **OR** acetaminophen, alteplase, heparin, ondansetron **OR** ondansetron (ZOFRAN) IV    Anti-infectives (From admission, onward)   Start     Dose/Rate Route Frequency Ordered Stop   03/17/20 1145  ceFAZolin (ANCEF) IVPB 2g/100 mL premix        2 g 200 mL/hr over 30 Minutes Intravenous On call to O.R. 03/17/20 1133 03/17/20 1239        Objective:   Vitals:   03/19/20 1514 03/19/20 2120 03/20/20 0030 03/20/20 0607  BP: (!) 99/37 (!) 68/23 (!) 88/52 112/78  Pulse: 95 83  89  Resp: '18 18  17  ' Temp:  97.7 F (36.5 C)  (!) 97 F (36.1 C)  TempSrc:      SpO2:  92%    Weight:  Height:        Wt Readings from Last 3 Encounters:  03/19/20 60.7 kg  02/25/20 61.5 kg  02/17/20 78 kg     Intake/Output Summary (Last 24 hours) at 03/20/2020 1122 Last data filed at 03/20/2020 0300 Gross per 24 hour  Intake 915 ml  Output --  Net 915 ml    Physical Exam  Gen:- Awake Alert,  In no apparent distress  HEENT:- [place].AT, No sclera icterus Neck-Supple Neck, right IJ HD catheter Lungs-  CTAB , fair symmetrical air movement CV- S1, S2 normal, regular  Abd-  +ve B.Sounds, Abd Soft, No tenderness, PEG tube site is clean dry and intact Extremity/Skin:- No  edema, pedal pulses present  Psych-affect is appropriate, oriented x3 Neuro-generalized weakness, no new focal deficits, no tremors MSK-functional RUA straight AVG (placed 02/17/20), AND nonfunctional left forearm AV fistula   Data Review:   Micro Results No results found for this or any previous visit (from the past 240 hour(s)).  Radiology Reports DG Chest Portable 1 View  Result Date: 03/15/2020 CLINICAL DATA:  Difficulty swallowing for 2 days. The patient tested positive for COVID-19 02/22/2020. EXAM: PORTABLE CHEST 1 VIEW COMPARISON:  Single-view of the chest 02/23/2020. PA and lateral  chest 12/05/2016. FINDINGS: The lungs are clear. There is cardiomegaly and aortic atherosclerosis. A few calcified pleural plaques are noted. Right IJ approach dialysis catheter is in place and the patient is status post right shoulder replacement. IMPRESSION: No acute disease. Cardiomegaly. Aortic Atherosclerosis (ICD10-I70.0). Electronically Signed   By: Inge Rise M.D.   On: 03/15/2020 14:58   DG Chest Port 1 View  Result Date: 02/23/2020 CLINICAL DATA:  Cough and inability to swallow.  COVID-19 positive. EXAM: PORTABLE CHEST 1 VIEW COMPARISON:  07/10/2018 FINDINGS: A right jugular dialysis catheter remains in place terminating over the high right atrium. The cardiac silhouette remains mildly enlarged. Aortic atherosclerosis is noted. The lungs are hyperinflated. Mildly accentuated interstitial markings bilaterally are similar to the prior study. No confluent airspace opacity, edema, sizable pleural effusion, or pneumothorax is identified. Calcified pleural plaques are noted. Oral contrast material is partially visualized in the colon. A right shoulder arthroplasty is noted. IMPRESSION: Chronic findings without evidence of acute cardiopulmonary disease. Electronically Signed   By: Logan Bores M.D.   On: 02/23/2020 08:11    CBC Recent Labs  Lab 03/15/20 1700 03/17/20 0650 03/18/20 0944 03/19/20 0711 03/20/20 0603  WBC 3.2* 3.6* 4.4 5.1 5.5  HGB 8.0* 7.8* 6.4* 7.8* 7.8*  HCT 26.8* 26.5* 21.7* 25.0* 25.4*  PLT 146* 155 111* 97* 96*  MCV 91.8 92.0 92.7 86.5 89.1  MCH 27.4 27.1 27.4 27.0 27.4  MCHC 29.9* 29.4* 29.5* 31.2 30.7  RDW 15.7* 16.0* 16.1* 16.5* 16.9*    Chemistries  Recent Labs  Lab 03/15/20 1700 03/15/20 1700 03/16/20 0412 03/17/20 0650 03/18/20 0944 03/19/20 0711 03/20/20 0603  NA 137   < > 141 144 143 139 141  K 3.1*   < > 3.4* 4.8 4.1 4.1 4.6  CL 99   < > 104 112* 108 107 108  CO2 23   < > 21* '22 25 22 22  ' GLUCOSE 64*   < > 106* 143* 120* 115* 137*  BUN 21    < > 21 22 27* 46* 54*  CREATININE 5.90*   < > 6.15* 7.10* 5.54* 6.18* 6.62*  CALCIUM 8.8*   < > 9.0 9.0 8.9 9.2 9.3  MG 1.9  --   --   --   --   --   --  AST 13*  --   --   --   --   --   --   ALT 7  --   --   --   --   --   --   ALKPHOS 42  --   --   --   --   --   --   BILITOT 1.2  --   --   --   --   --   --    < > = values in this interval not displayed.   ------------------------------------------------------------------------------------------------------------------ No results for input(s): CHOL, HDL, LDLCALC, TRIG, CHOLHDL, LDLDIRECT in the last 72 hours.  Lab Results  Component Value Date   HGBA1C 5.1 02/23/2020   ------------------------------------------------------------------------------------------------------------------ No results for input(s): TSH, T4TOTAL, T3FREE, THYROIDAB in the last 72 hours.  Invalid input(s): FREET3 ------------------------------------------------------------------------------------------------------------------ No results for input(s): VITAMINB12, FOLATE, FERRITIN, TIBC, IRON, RETICCTPCT in the last 72 hours.  Coagulation profile No results for input(s): INR, PROTIME in the last 168 hours.  No results for input(s): DDIMER in the last 72 hours.  Cardiac Enzymes No results for input(s): CKMB, TROPONINI, MYOGLOBIN in the last 168 hours.  Invalid input(s): CK ------------------------------------------------------------------------------------------------------------------    Component Value Date/Time   BNP 423.0 (H) 12/27/2016 1510   Roxan Hockey M.D on 03/20/2020 at 11:22 AM  Go to [email or web address] - for contact info  [facility] - Office  [phone]

## 2020-03-20 NOTE — Addendum Note (Signed)
Addendum  created 03/20/20 1038 by Jonna Munro, CRNA   Charge Capture section accepted, Visit diagnoses modified

## 2020-03-20 NOTE — Progress Notes (Signed)
Nutrition Follow-up  DOCUMENTATION CODES:   Underweight  INTERVENTION:  Osmolite 1.5 @ 65 ml/hr via PEG.    Tube feeding regimen provides 2340 kcal (100% of needs), 98 grams of protein, and 1189 ml of H2O.    -If bolus feedings are desired at discharge, recommend:   355 ml (1.5 cans) Osmolite 1.5 via PEG QID  25 ml free water flush before and after each feeding administration   Tube feeding regimen provides 2130 kcal (97% of needs), 89 grams of protein, and 1082 ml of H2O. Total free water: 1082 ml daily   NUTRITION DIAGNOSIS:   Inadequate oral intake related to inability to eat, dysphagia as evidenced by NPO status.   GOAL:   Patient will meet greater than or equal to 90% of their needs   MONITOR:   Diet advancement, Labs, Weight trends, TF tolerance, Skin, I & O's  REASON FOR ASSESSMENT:   Consult Assessment of nutrition requirement/status, Enteral/tube feeding initiation and management  ASSESSMENT:  Brian Liu is a 69 y.o. male with medical history significant for MGUS/multiple myeloma, hypertension, ESRD, atrial fibrillation, pancytopenia. Hemodialysis today.   Patient is NPO with history of dysphagia. PEG placed 8/13. Tolerating continuous tube feeding at goal rate. He will be discharging to [facility] [place]. No new recommendations.   Medications reviewed and include: Protonix, Midodrine, Sensipar  Labs: BMP Latest Ref Rng & Units 03/20/2020 03/19/2020 03/18/2020  Glucose 70 - 99 mg/dL 137(H) 115(H) 120(H)  BUN 8 - 23 mg/dL 54(H) 46(H) 27(H)  Creatinine 0.61 - 1.24 mg/dL 6.62(H) 6.18(H) 5.54(H)  BUN/Creat Ratio 10 - 24 - - -  Sodium 135 - 145 mmol/L 141 139 143  Potassium 3.5 - 5.1 mmol/L 4.6 4.1 4.1  Chloride 98 - 111 mmol/L 108 107 108  CO2 22 - 32 mmol/L _0 Calcium 8.9 - 10.3 mg/dL 9.3 9.2 8.9     NUTRITION - FOCUSED PHYSICAL EXAM: Nutrition-Focused physical exam completed. Findings are moderate fat depletion (orbital, thoracic),  moderate muscle depletion (clavicles, acromion process, scapular), and no edema.    Diet Order:   Diet Order    None      EDUCATION NEEDS:   No education needs have been identified at this time  Skin:  Skin Assessment: Skin Integrity Issues: Skin Integrity Issues:: Incisions Incisions: closed abdomen, s/p PEG placement 03/17/20  Last BM:  8/16  Height:   Ht Readings from Last 1 Encounters:  03/17/20 _1  (1.905 m)    Weight:   Wt Readings from Last 1 Encounters:  03/20/20 62.3 kg    Ideal Body Weight:  89.1 kg  BMI:  Body mass index is 17.17 kg/m.  Estimated Nutritional Needs:   Kcal:  2200-2400  Protein:  105-120 grams  Fluid:  10000 ml + UOP   Colman Cater MS,RD,CSG,LDN Pager: [phone]

## 2020-03-20 NOTE — Procedures (Signed)
° ° ° °  HEMODIALYSIS TREATMENT NOTE:   HD completed via RIJ TDC as pt adamantly refused cannulation of AVG as ordered.   Irritable throughout treatment, asked "how much longer?' as early as 30 minutes into session.  Complained of upset stomach and diarrhea.  Used bedpan 4 times during treatment, black watery stools each time,  between 100-300cc.  Further upset about recommendation to go to SNF.  Demanded to end treatment 1 hour and 45 minutes into session, stating "I feel BAD!  I just want off!"  I was able to negotiate a total run time of 2 hours and 10 minutes.  Dr. Joelyn Oms was notified.  Hypotensive throughout session; able to tolerate UF only at minimal rate.  Net UF 232 cc.    Rockwell Alexandria, RN

## 2020-03-21 DIAGNOSIS — H40003 Preglaucoma, unspecified, bilateral: Secondary | ICD-10-CM | POA: Diagnosis not present

## 2020-03-21 DIAGNOSIS — E46 Unspecified protein-calorie malnutrition: Secondary | ICD-10-CM | POA: Diagnosis not present

## 2020-03-21 DIAGNOSIS — C9000 Multiple myeloma not having achieved remission: Secondary | ICD-10-CM | POA: Diagnosis not present

## 2020-03-21 DIAGNOSIS — N2581 Secondary hyperparathyroidism of renal origin: Secondary | ICD-10-CM | POA: Diagnosis not present

## 2020-03-21 DIAGNOSIS — D62 Acute posthemorrhagic anemia: Secondary | ICD-10-CM | POA: Diagnosis not present

## 2020-03-21 DIAGNOSIS — R0902 Hypoxemia: Secondary | ICD-10-CM | POA: Diagnosis not present

## 2020-03-21 DIAGNOSIS — R1310 Dysphagia, unspecified: Secondary | ICD-10-CM | POA: Diagnosis not present

## 2020-03-21 DIAGNOSIS — R1311 Dysphagia, oral phase: Secondary | ICD-10-CM | POA: Diagnosis not present

## 2020-03-21 DIAGNOSIS — I10 Essential (primary) hypertension: Secondary | ICD-10-CM | POA: Diagnosis not present

## 2020-03-21 DIAGNOSIS — R1314 Dysphagia, pharyngoesophageal phase: Secondary | ICD-10-CM | POA: Diagnosis not present

## 2020-03-21 DIAGNOSIS — E162 Hypoglycemia, unspecified: Secondary | ICD-10-CM | POA: Diagnosis not present

## 2020-03-21 DIAGNOSIS — D631 Anemia in chronic kidney disease: Secondary | ICD-10-CM | POA: Diagnosis not present

## 2020-03-21 DIAGNOSIS — I499 Cardiac arrhythmia, unspecified: Secondary | ICD-10-CM | POA: Diagnosis not present

## 2020-03-21 DIAGNOSIS — E785 Hyperlipidemia, unspecified: Secondary | ICD-10-CM | POA: Diagnosis not present

## 2020-03-21 DIAGNOSIS — N186 End stage renal disease: Secondary | ICD-10-CM | POA: Diagnosis not present

## 2020-03-21 DIAGNOSIS — I959 Hypotension, unspecified: Secondary | ICD-10-CM | POA: Diagnosis not present

## 2020-03-21 DIAGNOSIS — Z8673 Personal history of transient ischemic attack (TIA), and cerebral infarction without residual deficits: Secondary | ICD-10-CM | POA: Diagnosis not present

## 2020-03-21 DIAGNOSIS — R0681 Apnea, not elsewhere classified: Secondary | ICD-10-CM | POA: Diagnosis not present

## 2020-03-21 DIAGNOSIS — K219 Gastro-esophageal reflux disease without esophagitis: Secondary | ICD-10-CM | POA: Diagnosis not present

## 2020-03-21 DIAGNOSIS — R000 Tachycardia, unspecified: Secondary | ICD-10-CM | POA: Diagnosis not present

## 2020-03-21 DIAGNOSIS — R627 Adult failure to thrive: Secondary | ICD-10-CM | POA: Diagnosis not present

## 2020-03-21 DIAGNOSIS — R1312 Dysphagia, oropharyngeal phase: Secondary | ICD-10-CM | POA: Diagnosis not present

## 2020-03-21 DIAGNOSIS — Z452 Encounter for adjustment and management of vascular access device: Secondary | ICD-10-CM | POA: Diagnosis not present

## 2020-03-21 DIAGNOSIS — I4891 Unspecified atrial fibrillation: Secondary | ICD-10-CM | POA: Diagnosis not present

## 2020-03-21 DIAGNOSIS — R262 Difficulty in walking, not elsewhere classified: Secondary | ICD-10-CM | POA: Diagnosis not present

## 2020-03-21 DIAGNOSIS — Z992 Dependence on renal dialysis: Secondary | ICD-10-CM | POA: Diagnosis not present

## 2020-03-21 DIAGNOSIS — Z931 Gastrostomy status: Secondary | ICD-10-CM | POA: Diagnosis not present

## 2020-03-21 DIAGNOSIS — D472 Monoclonal gammopathy: Secondary | ICD-10-CM | POA: Diagnosis not present

## 2020-03-21 DIAGNOSIS — R2681 Unsteadiness on feet: Secondary | ICD-10-CM | POA: Diagnosis not present

## 2020-03-21 DIAGNOSIS — I69320 Aphasia following cerebral infarction: Secondary | ICD-10-CM | POA: Diagnosis not present

## 2020-03-21 DIAGNOSIS — Z743 Need for continuous supervision: Secondary | ICD-10-CM | POA: Diagnosis not present

## 2020-03-21 DIAGNOSIS — Z431 Encounter for attention to gastrostomy: Secondary | ICD-10-CM | POA: Diagnosis not present

## 2020-03-21 DIAGNOSIS — I4821 Permanent atrial fibrillation: Secondary | ICD-10-CM | POA: Diagnosis not present

## 2020-03-21 DIAGNOSIS — M6281 Muscle weakness (generalized): Secondary | ICD-10-CM | POA: Diagnosis not present

## 2020-03-21 LAB — SARS CORONAVIRUS 2 BY RT PCR (HOSPITAL ORDER, PERFORMED IN ~~LOC~~ HOSPITAL LAB): SARS Coronavirus 2: NEGATIVE

## 2020-03-21 LAB — GLUCOSE, CAPILLARY
Glucose-Capillary: 102 mg/dL — ABNORMAL HIGH (ref 70–99)
Glucose-Capillary: 97 mg/dL (ref 70–99)

## 2020-03-21 MED ORDER — OSMOLITE 1.5 CAL PO LIQD: 1000.0000 mL | ORAL | 3 refills | Status: AC

## 2020-03-21 MED ORDER — HYDROXYZINE HCL 25 MG PO TABS: 25.0000 mg | ORAL_TABLET | Freq: Three times a day (TID) | ORAL | 0 refills | Status: AC | PRN

## 2020-03-21 MED ORDER — MIDODRINE HCL 10 MG PO TABS: 15.0000 mg | ORAL_TABLET | Freq: Three times a day (TID) | ORAL | 2 refills | Status: AC

## 2020-03-21 MED ORDER — ONDANSETRON HCL 4 MG PO TABS: 4.0000 mg | ORAL_TABLET | Freq: Four times a day (QID) | ORAL | 0 refills | Status: AC | PRN

## 2020-03-21 MED ORDER — MEGESTROL ACETATE 400 MG/10ML PO SUSP: 400.0000 mg | Freq: Every day | ORAL | 0 refills | Status: AC

## 2020-03-21 MED ORDER — OMEPRAZOLE 20 MG PO CPDR: 20.0000 mg | DELAYED_RELEASE_CAPSULE | Freq: Two times a day (BID) | ORAL | 1 refills | Status: AC

## 2020-03-21 MED ORDER — ACETAMINOPHEN 325 MG PO TABS: 650.0000 mg | ORAL_TABLET | Freq: Four times a day (QID) | ORAL | 0 refills | Status: AC | PRN

## 2020-03-21 MED ORDER — OXYCODONE-ACETAMINOPHEN 5-325 MG PO TABS: 1.0000 | ORAL_TABLET | Freq: Four times a day (QID) | ORAL | 0 refills | Status: AC | PRN

## 2020-03-21 NOTE — Progress Notes (Signed)
[facility] Progress Note   69 y.o.malewith a past medical history notable for MGUS/myeloma HTN afib ESRD on HDadmitted with dysphagia and PEG tube placement  Assessment/ Plan:   # ESRD:Continueoutpt prescriptionand schedule Javier Docker, MWF, TDC With matuer AVG  - Next HD on tomorrow on schedule: Again, will attempt to start using the RUA straight AVG (placed 02/17/20); 2K, no heparin, 2L UFSBP permitting.   # Volume/ hypotension: EDW 82kg as outpt.  Incorrect, cont gentle UF as tolerated  # Anemia of Chronic Kidney Disease:Hemoglobin around 8. Currently receiving no ESAs due to myeloma.Iron saturation ferritin elevated (43 and 703 respectively). CTM  # Secondary Hyperparathyroidism/Hyperphosphatemia:On hecterol 0.55mcg MWF and sensipar 30mg  nightly. HoldingAuryxia given not eating -> recheck phos later this week. .Cont Sensipar 30 mg nightly Patient states he will permit cannulation of AV graft # Vascular access:Recently placed AVG 02/17/20  (loose silk sutures along outflow bec of steal). Good bruit in the straight AVG -> it's been a month since surgery. Will start using it today  # Hospital problem:Dysphagia: s/p PEG tube placement per primary team.   hypotension: Chronic problem. Asymptomatic. Midodrine as tolerated  Failure to thrive, likely would benefit from palliative involvement   Subjective:     Freq BMs with TFs  Patient refused cannulation of AV graft yesterday, discussed with him this morning and he at the current time states he will permit cannulation tomorrow  Signed off early, was having frequent bowel movements, minimally ultrafiltration  Continues to have intradialytic hypotension    Objective:   BP (!) 105/36 (BP Location: Left Leg)   Pulse 67   Temp 97.6 F (36.4 C)   Resp 20   Ht 6\' 3"  (1.905 m)   Wt 62 kg   SpO2 94%   BMI 17.08 kg/m   Intake/Output Summary (Last 24 hours) at 03/15/2020 1025 Last data filed at  03/20/2020 1410 Gross per 24 hour  Intake --  Output 232 ml  Net -232 ml   Weight change:   Physical Exam: Constitutional:Chronically ill-appearing, frail, thin, no significant distress CV: normal rate, no edema, no rub Respiratory:Bilateral chest rise, normal work of breathing Gastrointestinal: soft, non-tender, no palpable masses or hernias Skin: no visible lesions or rashes Access: RUA AVG +bruit  Imaging: No results found.  Labs: BMET Recent Labs  Lab 03/15/20 1700 03/16/20 0412 03/17/20 0650 03/18/20 0944 03/19/20 0711 03/20/20 0603  NA 137 141 144 143 139 141  K 3.1* 3.4* 4.8 4.1 4.1 4.6  CL 99 104 112* 108 107 108  CO2 23 21* 22 25 22 22   GLUCOSE 64* 106* 143* 120* 115* 137*  BUN 21 21 22  27* 46* 54*  CREATININE 5.90* 6.15* 7.10* 5.54* 6.18* 6.62*  CALCIUM 8.8* 9.0 9.0 8.9 9.2 9.3  PHOS  --   --   --  2.3*  --   --    CBC Recent Labs  Lab 03/17/20 0650 03/18/20 0944 03/19/20 0711 03/20/20 0603  WBC 3.6* 4.4 5.1 5.5  HGB 7.8* 6.4* 7.8* 7.8*  HCT 26.5* 21.7* 25.0* 25.4*  MCV 92.0 92.7 86.5 89.1  PLT 155 111* 97* 96*    Medications:    . sodium chloride   Intravenous Once  . Chlorhexidine Gluconate Cloth  6 each Topical Q0600  . cinacalcet  30 mg Oral Q supper  . midodrine  10 mg Oral TID WC  . pantoprazole (PROTONIX) IV  40 mg Intravenous Q12H      Rexene Agent ,  MD 03/10/2020, 10:25 AM

## 2020-03-21 NOTE — Plan of Care (Signed)
  Problem: Acute Rehab OT Goals (only OT should resolve) Goal: Pt. Will Perform Grooming Flowsheets (Taken 03/15/2020 1138) Pt Will Perform Grooming:  with set-up  sitting Goal: Pt. Will Perform Upper Body Dressing Flowsheets (Taken 03/14/2020 1138) Pt Will Perform Upper Body Dressing:  with min assist  sitting Goal: Pt. Will Perform Lower Body Dressing Flowsheets (Taken 04/02/2020 1138) Pt Will Perform Lower Body Dressing:  with min assist  sitting/lateral leans  sit to/from stand Goal: Pt. Will Transfer To Toilet Flowsheets (Taken 03/31/2020 1138) Pt Will Transfer to Toilet:  with min assist  stand pivot transfer  bedside commode Goal: Pt. Will Perform Toileting-Clothing Manipulation Flowsheets (Taken 03/22/2020 1138) Pt Will Perform Toileting - Clothing Manipulation and hygiene:  with min assist  sitting/lateral leans  sit to/from stand Goal: Pt/Caregiver Will Perform Home Exercise Program Flowsheets (Taken 03/06/2020 1138) Pt/caregiver will Perform Home Exercise Program:  Increased strength  Both right and left upper extremity  With minimal assist  With written HEP provided

## 2020-03-21 NOTE — Evaluation (Signed)
Occupational Therapy Evaluation Patient Details Name: Brian Liu MRN: [id number] DOB: [birth year] Today's Date: 03/11/2020    History of Present Illness Brian Liu is a 69 y.o. male with medical history significant for MGUS/multiple myeloma, hypertension, ESRD, atrial fibrillation, pancytopenia.Patient was brought to the ED reports that patient has not been able to swallow for the past 2 days.  Patient was recently admitted 7/20 -  7/24 for dysphagia thought to be of oropharyngeal etiology.  Speech therapy evaluated patient and recommended a dysphagia 1 diet with honey thick liquids.  Recommendations was that patient would need a PEG, which patient has been considering for the near future.  Prior to that hospitalization, Patient was seen at [facility] on 02/16/2021 for expressive aphasia and left-sided weakness.  At that time he was evaluated by neurology but found not to be a TPA candidate because he just had vascular procedure getting dialysis fistula earlier that day.  Patient symptoms improved.  He ended up being admitted to [facility] facility in [place] for stroke work-up.  Stroke work-up was negative.  He had an EGD done which was unremarkable.   Clinical Impression   Pt agreeable to OT evaluation this am, oriented to self only. Pt requiring assistance for all mobility and ADL completion. Pt with significant weakness and poor activity tolerance limiting participation in ADLs and mobility tasks. Recommend SNF on discharge to improve safety and independence in ADLs.     Follow Up Recommendations  SNF    Equipment Recommendations  None recommended by OT       Precautions / Restrictions Precautions Precautions: Fall Restrictions Weight Bearing Restrictions: No      Mobility Bed Mobility Overal bed mobility: Needs Assistance Bed Mobility: Supine to Sit;Sit to Supine     Supine to sit: HOB elevated;Mod assist;+2 for physical assistance Sit to supine: Mod assist;+2 for physical  assistance   General bed mobility comments: +2 assist for scooting up to [place] transfer comment: pt declined        ADL either performed or assessed with clinical judgement   ADL Overall ADL's : Needs assistance/impaired     Grooming: Wash/dry hands;Wash/dry face;Set up;Sitting;Bed level           Upper Body Dressing : Maximal assistance;Bed level Upper Body Dressing Details (indicate cue type and reason): cuing for lifting arms to donn gown Lower Body Dressing: Total assistance;Bed level Lower Body Dressing Details (indicate cue type and reason): total assist for donning socks               General ADL Comments: pt with severe weakness and poor activity tolerance, requiring signficant assistance for ADLs     Vision Baseline Vision/History: Legally blind Patient Visual Report: No change from baseline Vision Assessment?: No apparent visual deficits            Pertinent Vitals/Pain Pain Assessment: No/denies pain     Hand Dominance Left   Extremity/Trunk Assessment Upper Extremity Assessment Upper Extremity Assessment: Generalized weakness;LUE deficits/detail LUE Deficits / Details: LUE ROM <25%, dialysis grafts   Lower Extremity Assessment Lower Extremity Assessment: Defer to PT evaluation       Communication Communication Communication: No difficulties   Cognition Arousal/Alertness: Awake/alert Behavior During Therapy: WFL for tasks assessed/performed Overall Cognitive Status: Within Functional Limits for tasks assessed  Home Living Family/patient expects to be discharged to:: Skilled nursing facility Living Arrangements: Children Available Help at Discharge: Family;Available PRN/intermittently Type of Home: House Home Access: Stairs to enter [employer] of Steps: 1 Entrance Stairs-Rails: None Home Layout: One level      Bathroom Shower/Tub: Teacher, early years/pre: Standard     Home Equipment: Environmental consultant - 2 wheels          Prior Functioning/Environment Level of Independence: Needs assistance  Gait / Transfers Assistance Needed: Household ambulator with RW ADL's / Homemaking Assistance Needed: son assists with ADLs as needed            OT Problem List: Decreased strength;Decreased activity tolerance;Impaired balance (sitting and/or standing);Decreased cognition;Decreased knowledge of use of DME or AE;Impaired UE functional use      OT Treatment/Interventions: Self-care/ADL training;Therapeutic exercise;DME and/or AE instruction;Therapeutic activities;Patient/family education    OT Goals(Current goals can be found in the care plan section) Acute Rehab OT Goals Patient Stated Goal: return home after rehab OT Goal Formulation: With patient Time For Goal Achievement: 04/04/20 Potential to Achieve Goals: Fair  OT Frequency: Min 1X/week           Co-evaluation PT/OT/SLP Co-Evaluation/Treatment: Yes Reason for Co-Treatment: Complexity of the patient's impairments (multi-system involvement);For patient/therapist safety;To address functional/ADL transfers   OT goals addressed during session: ADL's and self-care         End of Session Equipment Utilized During Treatment: Oxygen  Activity Tolerance: Patient limited by fatigue Patient left: in bed;with call bell/phone within reach;with bed alarm set  OT Visit Diagnosis: Repeated falls (R29.6);Muscle weakness (generalized) (M62.81)                Time: 9470-7615 OT Time Calculation (min): 19 min Charges:  OT General Charges $OT Visit: 1 Visit OT Evaluation $OT Eval Moderate Complexity: [place], OTR/L  [phone] 03/13/2020, 10:35 AM

## 2020-03-21 NOTE — Discharge Instructions (Signed)
1)PEG Tube Feeding---- Osmolite 1.5@ 75ml/hr via PEG--- -If bolus feedings are desired at discharge, recommend:  355 ml (1.5 cans) Osmolite 1.5 via PEG QID 25 ml free water flush before and after each feeding administration -- ---Ok to use Jevity 1.2 in [place] of Osmolite 1.5 at same rate and please get Dietician consult at [facility] facility---  2)Please get dietitian consult----patient may need to change Tube feeding formula due to loose stools -- ---Ok to use Jevity 1.2 in [place] of Osmolite 1.5 at same rate and please get Dietician consult at SNF facility---  3)Avoid ibuprofen/Advil/Aleve/Motrin/Goody Powders/Naproxen/BC powders/Meloxicam/Diclofenac/Indomethacin and other Nonsteroidal anti-inflammatory medications as these will make you more likely to bleed and can cause stomach ulcers, can also cause Kidney problems.   4) please continue hemodialysis on Mondays Wednesdays and Fridays---  5) please repeat CBC and BMP blood test on Friday, 03/24/2020  6)Please get TSH thyroid blood test on Friday, 03/24/2020

## 2020-03-21 NOTE — Plan of Care (Signed)
IV removed. PEG tube disconnected and flushed. Flushing well. Pt alert and stable. Mild confusion around time of day and date. Reoriented. Pt belongings with patient in bags.

## 2020-03-21 NOTE — TOC Transition Note (Signed)
Transition of Care [facility]) - CM/SW Discharge Note   Patient Details  Name: Brian Liu MRN: [id number] Date of Birth: [birth year]  [facility] of [facility]) CM/SW Contact:  Natasha Bence, LCSW Phone Number: 03/19/2020, 2:48 PM   Clinical Narrative:    CSW contacted [facility] to verify their ability to take patient. [facility] agreeable to take patient. [facility] has been notified of patients Bed bugs Hx and verified [facility] as the patient's dialysis treatment center. [facility] has also been notified of the patient's negative Covid 19 status. [name] with [facility] has requested that patient's nutrition be changed from [facility] to Jevity 1.2 in addition to Bolus. CSW has notified Md, nurse and dietition of needed changes. CSW has scheduled EMS transport for 4:00pm est and completed med necessity form. TOC signing off.    Final next level of care: [place] Barriers to Discharge: Continued Medical Work up   Patient Goals and CMS Choice Patient states their goals for this hospitalization and ongoing recovery are:: return home CMS Medicare.gov Compare Post Acute Care list provided to:: Patient Represenative (must comment) Choice offered to / list presented to : Adult Children  Discharge Placement  [facility]                     Discharge Plan and Services In-house Referral: Clinical Social Work   Post Acute Care Choice: Resumption of Svcs/PTA Provider          DME Arranged: Tube feeding DME Agency: Other - Comment ([facility]) Date DME Agency Contacted: 03/16/20   Representative spoke with at DME Agency: [name] HH Arranged: RN [name] Agency: [facility] Date [place]: 03/17/20   Representative spoke with at [place]: [name] ([place]) Interventions     Readmission Risk Interventions Readmission Risk Prevention Plan 03/16/2020  Transportation Screening  Complete  Home Care Screening Complete  Medication Review (RN CM) Complete  Some recent data might be hidden

## 2020-03-21 NOTE — Care Management Important Message (Signed)
Important Message  Patient Details  Name: Brian Liu MRN: [id number] Date of Birth: Oct 02, 1950   Medicare Important Message Given:  Yes     Tommy Medal 03/13/2020, 3:32 PM

## 2020-03-21 NOTE — Discharge Summary (Addendum)
Brian Liu, is a 69 y.o. male  DOB [birth year]  MRN [id number].  Admission date:  03/15/2020  Admitting Physician  Bethena Roys, MD  Discharge Date:  03/22/2020   Primary MD  [name], MD  Recommendations for primary care physician for things to follow:   1)PEG Tube Feeding---- Osmolite 1.5@ 75m/hr via PEG--- -If bolus feedings are desired at discharge, recommend:  355 ml (1.5 cans) Osmolite 1.5 via PEG QID 25 ml free water flush before and after each feeding administration --- ---Ok to use Jevity 1.2 in Lieu of Osmolite 1.5 at same rate and [facility]---   2) please get dietitian consult----patient may need to change Tube feeding formula due to loose stools ----Ok to use Jevity 1.2 in LKensettof Osmolite 1.5 at same rate and please get Dietician consult at SNF facility---   3)Avoid ibuprofen/Advil/Aleve/Motrin/Goody Powders/Naproxen/BC powders/Meloxicam/Diclofenac/Indomethacin and other Nonsteroidal anti-inflammatory medications as these will make you more likely to bleed and can cause stomach ulcers, can also cause Kidney problems.   4) please continue hemodialysis on Mondays Wednesdays and Fridays---  5) please repeat CBC and BMP blood test on Friday, 03/24/2020  6)Please get TSH thyroid blood test on Friday, 03/24/2020  Admission Diagnosis  Oropharyngeal dysphagia [R13.12] Hypoglycemia [E16.2] Dysphagia [R13.10] Malnutrition, unspecified type ([place] [E46]   Discharge Diagnosis  Oropharyngeal dysphagia [R13.12] Hypoglycemia [E16.2] Dysphagia [R13.10] Malnutrition, unspecified type ([place] [E46]    Principal Problem:   Dysphagia/FTT Active Problems:   Hypertension   Mitral regurgitation   ESRD (end stage renal disease) (HCC)   Atrial fibrillation (HCC)   Multiple myeloma ([place]   COVID-19 virus infection---Positive on 02/22/20--Now  asymptomatic   MGUS (monoclonal gammopathy of unknown significance)   Acute blood loss as cause of postoperative anemia-- Superimposed on Chronic Anemia of CKD      Past Medical History:  Diagnosis Date  . Atrial fibrillation (HCC)    Permanent, Not on Coumadin because of history of lower GI bleed  . Diastolic dysfunction    Restrictive  diastolic filling pattern echo,  February, 2013  . Dyslipidemia   . Ejection fraction    EF 60-65%, echo, 2013  . ESRD (end stage renal disease) ([place]    Dialysis, fistula in left arm, M-W-F  . Glaucoma   . Hypertension   . Lower gastrointestinal bleed   . LVH (left ventricular hypertrophy)   . 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 Mitral regurgitation    Mild by echo, 2013  . Multiple myeloma (HO[place] 04/30/2016  . Pancytopenia (HC[place]    Dr DIgnacia Bayley . Pulmonary hypertension (HCC)    Severe, PA pressure 65-70 mm of mercury, echo, 2013  . Tobacco abuse     Past Surgical History:  Procedure Laterality Date  . A/V FISTULAGRAM Left 04/03/2017   Procedure: A/V Fistulagram;  Surgeon: CConrad Niverville MD;  [facility];  Service: Cardiovascular;  Laterality: Left;  . A/V FISTULAGRAM Left 08/06/2018   Procedure: A/V  FISTULAGRAM;  Surgeon: [name], MD;  Location: [facility];  Service: Cardiovascular;  Laterality: Left;  . AV FISTULA PLACEMENT Left 1992   Left arm   . AV FISTULA PLACEMENT Right 02/17/2020   Procedure: INSERTION OF RIGHT ARM  ARTERIOVENOUS (AV)  GRAFT;  Surgeon: [name], MD;  Location: [facility];  Service: Vascular;  Laterality: Right;  . ESOPHAGOGASTRODUODENOSCOPY (EGD) WITH PROPOFOL N/A 03/17/2020   Procedure: ESOPHAGOGASTRODUODENOSCOPY (EGD) WITH PROPOFOL;  Surgeon: Aviva Signs, MD;  [facility];  Service: General;  Laterality: N/A;  . IR FLUORO GUIDE CV LINE RIGHT  06/23/2018  . IR FLUORO GUIDE CV LINE RIGHT  12/28/2019  . IR FLUORO GUIDE CV LINE RIGHT   01/20/2020  . IR PTA VENOUS EXCEPT DIALYSIS CIRCUIT  01/20/2020  . IR REMOVAL TUN CV CATH W/O FL  08/20/2018  . IR US GUIDE VASC ACCESS RIGHT  06/23/2018  . IR US GUIDE VASC ACCESS RIGHT  12/28/2019  . IR VENOCAVAGRAM SVC  01/20/2020  . 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 PERIPHERAL VASCULAR BALLOON ANGIOPLASTY Left 04/03/2017   Procedure: PERIPHERAL VASCULAR BALLOON ANGIOPLASTY;  Surgeon: Conrad [place], MD;  Location: [facility];  Service: Cardiovascular;  Laterality: Left;  left arm AV fistula  . PERIPHERAL VASCULAR BALLOON ANGIOPLASTY Left 08/06/2018   Procedure: PERIPHERAL VASCULAR BALLOON ANGIOPLASTY;  Surgeon: [name], MD;  Location: [facility];  Service: Cardiovascular;  Laterality: Left;     HPI  from the history and physical done on the day of admission:   Chief Complaint: Difficulty swallowing  HPI: Brian Liu is a 69 y.o. male with medical history significant for MGUS/multiple myeloma, hypertension, ESRD, atrial fibrillation, pancytopenia. Patient was brought to the ED reports that patient has not been able to swallow for the past 2 days.  Patient was recently admitted 7/20 -  7/24 for dysphagia thought to be of oropharyngeal etiology.  Speech therapy evaluated patient and recommended a dysphagia 1 diet with honey thick liquids.  Recommendations was that patient would need a PEG, which patient has been considering for the near future.   Prior to that hospitalization, Patient was seen at [facility] on 02/16/2021 for expressive aphasia and left-sided weakness. At that time he was evaluated by neurology but found not to be a TPA candidate because he just had vascular procedure getting dialysis fistula earlier that day.  Patient symptoms improved. He ended up being admitted to [facility] facility in [place] for stroke work-up.  Stroke work-up was negative.  He had an EGD done which was unremarkable.    Also recent hospitalization patient was also found to be COVID-19 positive, he was asymptomatic.   Patient reports that his last meal was 2 days ago.  His difficulty swallowing has progressed since he was discharged.  Patient tells me he has had episodes of choking while swallowing.  He feels food has been stuck at the back of his throat which he regurgitates..  He has a mild intermittent cough.  No difficulty breathing.  No fevers no chills.  No vomiting.  Patient son reported a 20 pound weight loss since onset of dysphagia.  ED Course: At the time of my evaluation systolic was down  to the 80s.  Potassium 3.1.  BUN 21.  Blood glucose 64.  EKG shows atrial fibrillation rate 88.  500 mill bolus given.  D50 given.  EDP talked to general surgeon, Dr. Arnoldo Morale, patient to be seen in the morning    Hospital Course:    Brief Summary:- Brian Liu a 69 y.o.malewith medical history significant forMGUS/multiple myeloma, hypertension, ESRD, atrial fibrillation, pancytopenia, history of recent COVID-19 infection (02/22/20), status post prior Covid 19 vaccination history of presumed TIAs with negative MRI on 02/18/2020 admitted on 03/15/2020 with dysphagia and failure to thrive and needing PEG tube placement for nutritional purposes -Status post PEG tube placement on 03/17/2020  A/p 1)Dysphagia and Failure to Thrive--PTA patient was getting speech therapy Had modified barium swallow on 02/18/2020-suggesting oropharyngeal dysfunction without any structural esophageal problems despite prior history of radiation therapy -Recent EGD  was unremarkable S/p PEG placement on 03/17/2020 -Dietitian/nutritionist consult appreciated PEG Tube Feeding---- Osmolite 1.5@ 67m/hr via PEG--- -If bolus feedings are desired at discharge,  recommend:  355 ml (1.5 cans) Osmolite 1.5 via PEG QID 25 ml free water flush before and after each feeding administration -Hemoglobin is up to 7.8 from 6.4 after transfusion of 1 unit of PRBC on 03/18/2020 - baseline usually around 8   discharge to SNF as he is tolerating PEG tube feeding better. --Consider dietitian consult to change tube feeding if loose stools persist ---Ok to use Jevity 1.2 in LDupreeof Osmolite 1.5 at same rate and please get Dietician consult at SNF facility---   2)Acute on chronic Anemia--- patient with chronic anemia of ESRD with baseline hemoglobin usually between 8 and 9 -Anemia is presumed to be due to acute blood loss in the setting of recent PEG tube placement with GI blood loss- -Discussed with general surgeon Dr. JArnoldo Moraleand on-call GI physician Dr. RGala Romney-Hemoglobin is up to 7.8 from 6.4 after transfusion of 1 unit of PRBC on 03/18/2020  --Continue PPI and repeat CBC on Friday, 03/24/2020 -Avoiding ESA/Procrit due to history of myeloma --defer to nephrology team  3)Chronic Atrial fibrillation----appears rate is controlled without AV nodal blocking agents, -Not on anticoagulation due to high risk of bleeding--patient has history of chronic subdural hematomas as well  4)ESRD---  HD Monday Wednesdays and Fridays, missed HD session on 03/15/2020, nephrology consult for HD appreciated -Had HD on 03/17/2020 - HD on 03/20/2020 --Per nephrologist okay to start using RUA straight AVG (placed 02/17/20) for hemodialysis rather than right and right IJ catheter  5)Hypothyroidism--- PTA patient was not taking Synthroid, repeat TSH on Friday, 03/24/2020  6)Chronic Hypotension--- continue midodrine at 15 mg 3 times daily due to persistent hypotension,    7) history of prior left occipital lobe stroke and Chronic bilateral cerebral convexity subdural hematomas--- please see MRI from July 2021 --Supportive management  8)FEN--- Tube Feeding---PEG tube feeding as above  #1 ----Ok to use Jevity 1.2 in LChoctawof Osmolite 1.5 at same rate and [facility]  - 9) generalized weakness and deconditioning-----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) and he (son) goes to hemodialysis center 3 times a week-----Pt is not able to manage tube feeding and other ADLs alone while son is out of the house---so Son request SNF  10)Recent COVID-19 infection--- tested positive on 02/22/2020, has been asymptomatic for a long time now- --Repeat Covid test Negative on 03/28/2020   Disposition: The patient is from: Home  Anticipated d/c is to: SNF rehab   Code Status :  full  Family Communication:  (patient is alert, awake and coherent)   Consults  :  Nephrology/Gen Surgery/  Discharge Condition: stable  Follow UP   Contact information for after-discharge care    [facility] .   Service: Skilled Nursing Contact information: [address] [phone]                  Consults obtained - Neprhology/gen surgery  Diet and Activity recommendation:  As advised  Discharge Instructions    Discharge Instructions    Call MD for:  difficulty breathing, headache or visual disturbances   Complete by: As directed    Call MD for:  persistant dizziness or light-headedness   Complete by: As directed    Call MD for:  persistant nausea and vomiting   Complete by: As directed    Call MD for:  redness, tenderness, or signs of infection (pain, swelling, redness, odor or green/yellow discharge around incision site)   Complete by: As directed    Call MD for:  temperature >100.4   Complete by: As directed    Diet clear liquid   Complete by: As directed    1)PEG Tube Feeding---- Osmolite 1.5@ 63m/hr via PEG--- -If bolus feedings are desired at discharge, recommend:  355 ml (1.5 cans) Osmolite 1.5 via PEG QID 25 ml free water flush before  and after each feeding administration  please get dietitian consult----patient may need to change Tube feeding formula due to loose stools   Discharge instructions   Complete by: As directed    1)PEG Tube Feeding---- Osmolite 1.5@ 651mhr via PEG--- -If bolus feedings are desired at discharge, recommend:  355 ml (1.5 cans) Osmolite 1.5 via PEG QID 25 ml free water flush before and after each feeding administration -- ---Ok to use Jevity 1.2 in LiWallerf Osmolite 1.5 at same rate and please get Dietician consult at SNF facility---   2) please get dietitian consult----patient may need to change Tube feeding formula due to loose stools ----Ok to use Jevity 1.2 in LiHaymarketf Osmolite 1.5 at same rate and please get Dietician consult at SNF facility---   3)Avoid ibuprofen/Advil/Aleve/Motrin/Goody Powders/Naproxen/BC powders/Meloxicam/Diclofenac/Indomethacin and other Nonsteroidal anti-inflammatory medications as these will make you more likely to bleed and can cause stomach ulcers, can also cause Kidney problems.   4) please continue hemodialysis on Mondays Wednesdays and Fridays---  5) please repeat CBC and BMP blood test on Friday, 03/24/2020  6)Please get TSH thyroid blood test on Friday, 03/24/2020   Increase activity slowly   Complete by: As directed    No wound care   Complete by: As directed         Discharge Medications     Allergies as of 03/20/2020      Reactions   Hydrocodone Itching   Cough syrup      Medication List    STOP taking these medications   bimatoprost 0.03 % ophthalmic solution Commonly known as: LUMIGAN   folic acid 1 MG tablet Commonly known as: FOLVITE   lanthanum 1000 MG chewable tablet Commonly known as: FOSRENOL   levothyroxine 137 MCG tablet Commonly known as: SYNTHROID   megestrol 20 MG tablet Commonly known as: MEGACE Replaced by: megestrol 400 MG/10ML suspension   mirtazapine 7.5 MG tablet Commonly known as: REMERON     TAKE  these medications   acetaminophen 325 MG tablet Commonly known as: TYLENOL Take 2 tablets (650  mg total) by mouth every 6 (six) hours as needed for mild pain, fever or headache (or Fever >/= 101).   Auryxia 1 GM 210 MG(Fe) tablet Generic drug: ferric citrate Take 210 mg by mouth 3 (three) times daily with meals.   cinacalcet 30 MG tablet Commonly known as: SENSIPAR Take 30 mg by mouth at bedtime.   dorzolamide-timolol 22.3-6.8 MG/ML ophthalmic solution Commonly known as: COSOPT Place 1 drop into both eyes 2 (two) times daily.   feeding supplement (OSMOLITE 1.5 CAL) Liqd Place 1,000 mLs into feeding tube continuous. Osmolite 1.5@ 16m/hr via PEG---  -If bolus feedings are desired at discharge, recommend:  355 ml (1.5 cans) Osmolite 1.5 via PEG QID 25 ml free water flush before and after each feeding administration   folic acid-vitamin b complex-vitamin c-selenium-zinc 3 MG Tabs tablet Take 1 tablet by mouth daily.   gabapentin 100 MG capsule Commonly known as: NEURONTIN Take 100 mg by mouth 2 (two) times daily.   hydrOXYzine 25 MG tablet Commonly known as: ATARAX/VISTARIL Take 1 tablet (25 mg total) by mouth every 8 (eight) hours as needed for anxiety, itching or nausea. What changed:   when to take this  reasons to take this   megestrol 400 MG/10ML suspension Commonly known as: MEGACE Take 10 mLs (400 mg total) by mouth daily. Replaces: megestrol 20 MG tablet   midodrine 10 MG tablet Commonly known as: PROAMATINE Take 1.5 tablets (15 mg total) by mouth 3 (three) times daily. Please do Not hold  on hemodialysis days-- ---to be given 3 times every day including on hemodialysis days What changed:   when to take this  additional instructions   omeprazole 20 MG capsule Commonly known as: PriLOSEC Take 1 capsule (20 mg total) by mouth 2 (two) times daily.   ondansetron 4 MG tablet Commonly known as: ZOFRAN Take 1 tablet (4 mg total) by mouth every 6 (six)  hours as needed for nausea.   oxyCODONE-acetaminophen 5-325 MG tablet Commonly known as: Percocet Take 1 tablet by mouth every 6 (six) hours as needed for up to 5 days for severe pain. What changed: when to take this   simvastatin 20 MG tablet Commonly known as: ZOCOR Take 20 mg by mouth daily.   Vyzulta 0.024 % Soln Generic drug: Latanoprostene Bunod Place 1 drop into both eyes at bedtime.            Durable Medical Equipment  (From admission, onward)         Start     Ordered   03/17/20 1115  For home use only DME Tube feeding  Once       Comments: --PEG tube feeding as below:-  --I Dr. CRoxan Hockeyanticipate that Mr. VDERL ABALOSwill need tube feeding from more than 90 days due to significant dysphagia and failure to thrive  Initiate Osmolite 1.5 @ 25 ml/hr via PEG and increase by 10 ml every 8 hours to goal rate of 65 ml/hr.    Tube feeding regimen provides 2340 kcal (100% of needs), 98 grams of protein, and 1189 ml of H2O.   -Recommend monitor Mg, K, and Phos daily x 3 days and replete as needed due to high refeeding risk   -If bolus feedings are desired at discharge, recommend:   355 ml (1.5 cans) Osmolite 1.5 via PEG QID  25 ml free water flush before and after each feeding administration   Tube feeding regimen provides 2130 kcal (97% of needs), 89 grams  of protein, and 1082 ml of H2O. Total free water: 1082 ml daily  ---I Dr. Roxan Hockey anticipate that Mr. ERIQ HUFFORD will need tube feeding from more than 90 days due to significant dysphagia and failure to thrive   03/17/20 1124          Major procedures and Radiology Reports - PLEASE review detailed and final reports for all details, in brief -   DG Chest Portable 1 View  Result Date: 03/15/2020 CLINICAL DATA:  Difficulty swallowing for 2 days. The patient tested positive for COVID-19 02/22/2020. EXAM: PORTABLE CHEST 1 VIEW COMPARISON:  Single-view of the chest 02/23/2020. PA  and lateral chest 12/05/2016. FINDINGS: The lungs are clear. There is cardiomegaly and aortic atherosclerosis. A few calcified pleural plaques are noted. Right IJ approach dialysis catheter is in place and the patient is status post right shoulder replacement. IMPRESSION: No acute disease. Cardiomegaly. Aortic Atherosclerosis (ICD10-I70.0). Electronically Signed   By: Inge Rise M.D.   On: 03/15/2020 14:58   DG Chest Port 1 View  Result Date: 02/23/2020 CLINICAL DATA:  Cough and inability to swallow.  COVID-19 positive. EXAM: PORTABLE CHEST 1 VIEW COMPARISON:  07/10/2018 FINDINGS: A right jugular dialysis catheter remains in place terminating over the high right atrium. The cardiac silhouette remains mildly enlarged. Aortic atherosclerosis is noted. The lungs are hyperinflated. Mildly accentuated interstitial markings bilaterally are similar to the prior study. No confluent airspace opacity, edema, sizable pleural effusion, or pneumothorax is identified. Calcified pleural plaques are noted. Oral contrast material is partially visualized in the colon. A right shoulder arthroplasty is noted. IMPRESSION: Chronic findings without evidence of acute cardiopulmonary disease. Electronically Signed   By: Logan Bores M.D.   On: 02/23/2020 08:11    Micro Results   Recent Results (from the past 240 hour(s))  SARS Coronavirus 2 by RT PCR (hospital order, performed in [facility] hospital lab) Nasopharyngeal Nasopharyngeal Swab     Status: None   Collection Time: 03/20/2020 12:14 PM   Specimen: Nasopharyngeal Swab  Result Value Ref Range Status   SARS Coronavirus 2 NEGATIVE NEGATIVE Final    Comment: (NOTE) SARS-CoV-2 target nucleic acids are NOT DETECTED.  The SARS-CoV-2 RNA is generally detectable in upper and lower respiratory specimens during the acute phase of infection. The lowest concentration of SARS-CoV-2 viral copies this assay can detect is 250 copies / mL. A negative result does not preclude  SARS-CoV-2 infection and should not be used as the sole basis for treatment or other patient management decisions.  A negative result may occur with improper specimen collection / handling, submission of specimen other than nasopharyngeal swab, presence of viral mutation(s) within the areas targeted by this assay, and inadequate number of viral copies (<250 copies / mL). A negative result must be combined with clinical observations, patient history, and epidemiological information.  Fact Sheet for Patients:   StrictlyIdeas.no  Fact Sheet for Healthcare Providers: BankingDealers.co.za  This test is not yet approved or  cleared by the [place] FDA and has been authorized for detection and/or diagnosis of SARS-CoV-2 by FDA under an Emergency Use Authorization (EUA).  This EUA will remain in effect (meaning this test can be used) for the duration of the COVID-19 declaration under Section 564(b)(1) of the Act, 21 U.S.C. section 360bbb-3(b)(1), unless the authorization is terminated or revoked sooner.  Performed at [facility], [address] [place], [place] [address]        Today   Subjective  Garrett Mitchum today has no new complaints  No fever  Or chills   No Nausea, Vomiting or Diarrhea         Patient has been seen and examined prior to discharge   Objective   Blood pressure 97/73, pulse 80, temperature (!) 97.5 F (36.4 C), temperature source Oral, resp. rate 20, height '6\' 3"'  (1.905 m), weight 62 kg, SpO2 94 %.  No intake or output data in the 24 hours ending 03/11/2020 1613  Exam Gen:- Awake Alert, cachectic appearing, In no apparent distress  HEENT:- [place].AT, No sclera icterus Neck-Supple Neck, right IJ HD catheter Lungs-  CTAB , fair symmetrical air movement CV- S1, S2 normal, regular  Abd-  +ve B.Sounds, Abd Soft, No tenderness, PEG tube site is clean dry and intact Extremity/Skin:- No  edema, pedal pulses  present  Psych-affect is appropriate, oriented x3 Neuro-generalized weakness, no new focal deficits, no tremors MSK-functional +ve Bruit RUA straight AVG (placed 02/17/20), AND nonfunctional left forearm AV fistula   Data Review   CBC w Diff:  Lab Results  Component Value Date   WBC 5.5 03/20/2020   HGB 7.8 (L) 03/20/2020   HGB 9.9 (L) 04/24/2017   HCT 25.4 (L) 03/20/2020   HCT 32.3 (L) 04/24/2017   PLT 96 (L) 03/20/2020   PLT 188 04/24/2017   LYMPHOPCT 22 02/22/2020   MONOPCT 10 02/22/2020   EOSPCT 4 02/22/2020   BASOPCT 1 02/22/2020    CMP:  Lab Results  Component Value Date   NA 141 03/20/2020   NA 141 04/24/2017   K 4.6 03/20/2020   CL 108 03/20/2020   CO2 22 03/20/2020   BUN 54 (H) 03/20/2020   BUN 50 (H) 04/24/2017   CREATININE 6.62 (H) 03/20/2020   PROT 6.5 03/15/2020   ALBUMIN 2.6 (L) 03/18/2020   BILITOT 1.2 03/15/2020   ALKPHOS 42 03/15/2020   AST 13 (L) 03/15/2020   ALT 7 03/15/2020  .   Total Discharge time is about 33 minutes  Roxan Hockey M.D on 03/05/2020 at 4:13 PM  Go to [email or web address] -  for contact info  [facility] - Office  [phone]

## 2020-03-21 NOTE — Progress Notes (Signed)
Physical Therapy Treatment Patient Details Name: Brian Liu MRN: [id number] DOB: 10-17-1950 Today's Date: 03/17/2020    History of Present Illness Brian Liu is a 69 y.o. male with medical history significant for MGUS/multiple myeloma, hypertension, ESRD, atrial fibrillation, pancytopenia.Patient was brought to the ED reports that patient has not been able to swallow for the past 2 days.  Patient was recently admitted 7/20 -  7/24 for dysphagia thought to be of oropharyngeal etiology.  Speech therapy evaluated patient and recommended a dysphagia 1 diet with honey thick liquids.  Recommendations was that patient would need a PEG, which patient has been considering for the near future.  Prior to that hospitalization, Patient was seen at [facility] on 02/16/2021 for expressive aphasia and left-sided weakness.  At that time he was evaluated by neurology but found not to be a TPA candidate because he just had vascular procedure getting dialysis fistula earlier that day.  Patient symptoms improved.  He ended up being admitted to [facility] facility in [place] for stroke work-up.  Stroke work-up was negative.  He had an EGD done which was unremarkable.    PT Comments    Pt requiring cues and assist to get to sitting EOB with good carryover, but does require physical assist due to weakness. Upon sitting, pt able to release BUE while maintaining balance, but too fatigued to stand. Pt assisted back to supine with HOB >30 deg, pt complains wanting head to be lowered but educated on tube feeding and importance of head being elevated so pt agreeable. Pt alert and conversational during session, but not oriented to location, date or situation and remains pleasantly confused despite therapist attempt to orient pt. Patient will benefit from continued physical therapy in hospital and recommendations below to increase strength, balance, endurance for safe ADLs and gait.    Follow Up Recommendations  SNF      Equipment Recommendations  None recommended by PT    Recommendations for Other Services       Precautions / Restrictions Precautions Precautions: Fall Restrictions Weight Bearing Restrictions: No    Mobility  Bed Mobility Overal bed mobility: Needs Assistance Bed Mobility: Supine to Sit;Sit to Supine  Supine to sit: HOB elevated;Mod assist;+2 for physical assistance Sit to supine: Mod assist;+2 for physical assistance;HOB elevated   General bed mobility comments: slow labored movement, mod A x2 for maneuvering BLE and trunk with supine<>sit trasnfer; +2 assist for scooting up to [facility] with cues for hooklying position to assist in moving up in bed  Transfers  General transfer comment: pt declined- "I'm too tired"  Ambulation/Gait          Stairs             Wheelchair Mobility    Modified Rankin (Stroke Patients Only)       Balance Overall balance assessment: Needs assistance Sitting-balance support: Feet supported;Bilateral upper extremity supported Sitting balance-Leahy Scale: Fair Sitting balance - Comments: seated EOB       Standing balance comment: pt declines       Cognition Arousal/Alertness: Awake/alert Behavior During Therapy: WFL for tasks assessed/performed Overall Cognitive Status: No family/caregiver present to determine baseline cognitive functioning     General Comments: Pt oriented to self and birthday. Pt reports we are in [place] and when oriented to [facility] in [place], [place] pt reports "never heard of it". Pt reports year is 2025 and president is Alden Hipp. Pt able to follow commands consistently and pleasantly confused.  Exercises      General Comments        Pertinent Vitals/Pain Pain Assessment: No/denies pain    Home Living Family/patient expects to be discharged to:: Skilled nursing facility Living Arrangements: Children Available Help at Discharge: Family;Available  PRN/intermittently Type of Home: House Home Access: Stairs to enter Entrance Stairs-Rails: None Home Layout: One level Home Equipment: Environmental consultant - 2 wheels      Prior Function Level of Independence: Needs assistance  Gait / Transfers Assistance Needed: Household ambulator with RW ADL's / Homemaking Assistance Needed: son assists with ADLs as needed     PT Goals (current goals can now be found in the care plan section) Acute Rehab PT Goals Patient Stated Goal: Return home PT Goal Formulation: With patient Time For Goal Achievement: 04/19/2020 Potential to Achieve Goals: Fair Progress towards PT goals: Progressing toward goals    Frequency    Min 3X/week      PT Plan Current plan remains appropriate    Co-evaluation PT/OT/SLP Co-Evaluation/Treatment: Yes Reason for Co-Treatment: Complexity of the patient's impairments (multi-system involvement);For patient/therapist safety;To address functional/ADL transfers PT goals addressed during session: Mobility/safety with mobility;Balance OT goals addressed during session: ADL's and self-care      AM-PAC PT "6 Clicks" Mobility   Outcome Measure  Help needed turning from your back to your side while in a flat bed without using bedrails?: None Help needed moving from lying on your back to sitting on the side of a flat bed without using bedrails?: A Little Help needed moving to and from a bed to a chair (including a wheelchair)?: A Lot Help needed standing up from a chair using your arms (e.g., wheelchair or bedside chair)?: A Lot Help needed to walk in hospital room?: A Lot Help needed climbing 3-5 steps with a railing? : Total 6 Click Score: 14    End of Session   Activity Tolerance: Patient limited by fatigue Patient left: in bed;with call bell/phone within reach;with bed alarm set Nurse Communication: Mobility status PT Visit Diagnosis: Unsteadiness on feet (R26.81);Other abnormalities of gait and mobility (R26.89);Muscle  weakness (generalized) (M62.81)     Time: 3474-2595 PT Time Calculation (min) (ACUTE ONLY): 19 min  Charges:  $Therapeutic Activity: 8-22 mins                      Talbot Grumbling PT, DPT 03/09/2020, 12:51 PM [phone]

## 2020-03-22 DIAGNOSIS — D472 Monoclonal gammopathy: Secondary | ICD-10-CM | POA: Diagnosis not present

## 2020-03-22 DIAGNOSIS — N2581 Secondary hyperparathyroidism of renal origin: Secondary | ICD-10-CM | POA: Diagnosis not present

## 2020-03-22 DIAGNOSIS — Z992 Dependence on renal dialysis: Secondary | ICD-10-CM | POA: Diagnosis not present

## 2020-03-22 DIAGNOSIS — N186 End stage renal disease: Secondary | ICD-10-CM | POA: Diagnosis not present

## 2020-03-22 DIAGNOSIS — D631 Anemia in chronic kidney disease: Secondary | ICD-10-CM | POA: Diagnosis not present

## 2020-03-28 ENCOUNTER — Encounter: Payer: Medicare Other | Admitting: Vascular Surgery

## 2020-04-04 DIAGNOSIS — Z992 Dependence on renal dialysis: Secondary | ICD-10-CM | POA: Diagnosis not present

## 2020-04-04 DIAGNOSIS — N186 End stage renal disease: Secondary | ICD-10-CM | POA: Diagnosis not present

## 2020-04-05 ENCOUNTER — Other Ambulatory Visit: Payer: Self-pay | Admitting: *Deleted

## 2020-04-05 ENCOUNTER — Encounter: Payer: Self-pay | Admitting: *Deleted

## 2020-04-05 DIAGNOSIS — 419620001 Death: Secondary | SNOMED CT | POA: Diagnosis not present

## 2020-04-05 NOTE — Patient Outreach (Addendum)
[name]) Care Management [name] Telephone Outreach, new referral from 02/28/20 Case closure/ unable to establish contact Unsuccessful (consecutive) outreach attempt # 4  04/05/2020  [name] 11/06/1950 [id number]  Unsuccessful(consecutive) fourthtelephone outreach [name], son/ caregiver ofVernon Moshe Cipro, 69 y/o male referred to [facility] CM 02/28/20 by [facility] RN CM after recent hospitalization July 20-24, 2035fr expressive aphasia, difficulty swallowing. Patient had recent planned outpatient procedure for AV graft replacement for hemodialysis access on 02/17/20 and experienced subsequent aphasia and difficulty swallowing; patient was taken to ED post-procedure 02/17/20 [facility]. 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  Patient experienced hospital re-admission August 11- 17, 2021 for ongoing dysphagia and FTT; he was discharged from hospital to SNF; noted through review of EHR that patient discharged from SNF/ rehabilitation on March 23, 2020.  HIPAA compliant voice mail message left for patient/ caregiver, requesting return call back.  Plan:  Verified [name] Successful outreach letter placed in mail to patient at time of initial outreach with caregiver (February 29, 2020)  [facility] as this is the fourth consecutive outreach attempt to patient's caregiver without call-back; will make patient's PCP aware of same   LOneta Rack RN, BSN, CGasburgCoordinator [facility]  [phone]

## 2020-04-05 DEATH — deceased

## 2020-05-02 ENCOUNTER — Ambulatory Visit: Payer: Medicare Other | Admitting: Neurology
